# Patient Record
Sex: Male | Born: 1950
Health system: Southern US, Community
[De-identification: ages and names within clinical notes are randomized; demographics above are authoritative.]

## PROBLEM LIST (undated history)

## (undated) DIAGNOSIS — J40 Bronchitis, not specified as acute or chronic: Secondary | ICD-10-CM

## (undated) DIAGNOSIS — K635 Polyp of colon: Secondary | ICD-10-CM

## (undated) DIAGNOSIS — G4730 Sleep apnea, unspecified: Secondary | ICD-10-CM

## (undated) DIAGNOSIS — T466X5A Adverse effect of antihyperlipidemic and antiarteriosclerotic drugs, initial encounter: Secondary | ICD-10-CM

## (undated) DIAGNOSIS — D369 Benign neoplasm, unspecified site: Secondary | ICD-10-CM

## (undated) DIAGNOSIS — I10 Essential (primary) hypertension: Secondary | ICD-10-CM

## (undated) DIAGNOSIS — J439 Emphysema, unspecified: Secondary | ICD-10-CM

## (undated) DIAGNOSIS — E785 Hyperlipidemia, unspecified: Secondary | ICD-10-CM

## (undated) DIAGNOSIS — E7800 Pure hypercholesterolemia, unspecified: Secondary | ICD-10-CM

## (undated) DIAGNOSIS — E039 Hypothyroidism, unspecified: Secondary | ICD-10-CM

## (undated) DIAGNOSIS — J449 Chronic obstructive pulmonary disease, unspecified: Secondary | ICD-10-CM

## (undated) DIAGNOSIS — G720 Drug-induced myopathy: Secondary | ICD-10-CM

## (undated) DIAGNOSIS — R0902 Hypoxemia: Secondary | ICD-10-CM

## (undated) DIAGNOSIS — M1990 Unspecified osteoarthritis, unspecified site: Secondary | ICD-10-CM

## (undated) DIAGNOSIS — R0600 Dyspnea, unspecified: Secondary | ICD-10-CM

## (undated) HISTORY — DX: Emphysema, unspecified: J43.9

## (undated) HISTORY — DX: Benign neoplasm, unspecified site: D36.9

## (undated) HISTORY — DX: Hypoxemia: R09.02

## (undated) HISTORY — PX: COLONOSCOPY: SHX174

## (undated) HISTORY — DX: Polyp of colon: K63.5

## (undated) HISTORY — DX: Hyperlipidemia, unspecified: E78.5

## (undated) HISTORY — DX: Drug-induced myopathy: T46.6X5A

## (undated) HISTORY — PX: TONSILLECTOMY: SUR1361

## (undated) HISTORY — DX: Drug-induced myopathy: G72.0

## (undated) HISTORY — DX: Sleep apnea, unspecified: G47.30

## (undated) HISTORY — DX: Unspecified osteoarthritis, unspecified site: M19.90

## (undated) HISTORY — DX: Bronchitis, not specified as acute or chronic: J40

---
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
## 2006-07-17 DIAGNOSIS — K635 Polyp of colon: Secondary | ICD-10-CM

## 2006-07-17 HISTORY — DX: Polyp of colon: K63.5

## 2007-06-17 ENCOUNTER — Ambulatory Visit: Payer: Self-pay | Admitting: General Surgery

## 2007-09-27 ENCOUNTER — Ambulatory Visit: Payer: Self-pay | Admitting: Family Medicine

## 2012-01-12 ENCOUNTER — Emergency Department (HOSPITAL_COMMUNITY): Payer: BC Managed Care – PPO

## 2012-01-12 ENCOUNTER — Emergency Department (HOSPITAL_COMMUNITY)
Admission: EM | Admit: 2012-01-12 | Discharge: 2012-01-12 | Disposition: A | Discharge status: Home / Self Care | Payer: BC Managed Care – PPO | Attending: Emergency Medicine | Admitting: Emergency Medicine

## 2012-01-12 ENCOUNTER — Encounter (HOSPITAL_COMMUNITY): Payer: Self-pay

## 2012-01-12 DIAGNOSIS — R55 Syncope and collapse: Secondary | ICD-10-CM | POA: Insufficient documentation

## 2012-01-12 DIAGNOSIS — I10 Essential (primary) hypertension: Secondary | ICD-10-CM | POA: Insufficient documentation

## 2012-01-12 DIAGNOSIS — R079 Chest pain, unspecified: Secondary | ICD-10-CM | POA: Insufficient documentation

## 2012-01-12 DIAGNOSIS — E119 Type 2 diabetes mellitus without complications: Secondary | ICD-10-CM | POA: Insufficient documentation

## 2012-01-12 DIAGNOSIS — F17200 Nicotine dependence, unspecified, uncomplicated: Secondary | ICD-10-CM | POA: Insufficient documentation

## 2012-01-12 DIAGNOSIS — M549 Dorsalgia, unspecified: Secondary | ICD-10-CM | POA: Insufficient documentation

## 2012-01-12 DIAGNOSIS — Z79899 Other long term (current) drug therapy: Secondary | ICD-10-CM | POA: Insufficient documentation

## 2012-01-12 HISTORY — DX: Essential (primary) hypertension: I10

## 2012-01-12 LAB — CBC
HCT: 39.1 % (ref 39.0–52.0)
Hemoglobin: 13.6 g/dL (ref 13.0–17.0)
MCH: 30.9 pg (ref 26.0–34.0)
MCHC: 34.8 g/dL (ref 30.0–36.0)
MCV: 88.9 fL (ref 78.0–100.0)
Platelets: 193 10*3/uL (ref 150–400)
RBC: 4.4 MIL/uL (ref 4.22–5.81)
RDW: 13 % (ref 11.5–15.5)
WBC: 6.9 10*3/uL (ref 4.0–10.5)

## 2012-01-12 LAB — BASIC METABOLIC PANEL
BUN: 22 mg/dL (ref 6–23)
CO2: 24 mEq/L (ref 19–32)
Calcium: 9.3 mg/dL (ref 8.4–10.5)
Chloride: 104 mEq/L (ref 96–112)
Creatinine, Ser: 0.91 mg/dL (ref 0.50–1.35)
GFR calc Af Amer: >90 mL/min (ref >90)
GFR calc non Af Amer: >90 mL/min (ref >90)
Glucose, Bld: 202 mg/dL — ABNORMAL HIGH (ref 70–99)
Potassium: 3.2 mEq/L — ABNORMAL LOW (ref 3.5–5.1)
Sodium: 140 mEq/L (ref 135–145)

## 2012-01-12 LAB — POCT I-STAT TROPONIN I: Troponin i, poc: 0 ng/mL (ref 0.00–0.08)

## 2012-01-12 NOTE — Discharge Instructions (Signed)
FOLLOW UP WITH YOUR DOCTOR ON Monday FOR RECHECK. CONTINUE YOUR CURRENT MEDICATIONS AS USUAL. RETURN HERE WITH ANY WORSENING SYMPTOMS OR NEW CONCERNS.  Chest Pain (Nonspecific) It is often hard to give a specific diagnosis for the cause of chest pain. There is always a chance that your pain could be related to something serious, such as a heart attack or a blood clot in the lungs. You need to follow up with your caregiver for further evaluation. CAUSES   Heartburn.   Pneumonia or bronchitis.   Anxiety or stress.   Inflammation around your heart (pericarditis) or lung (pleuritis or pleurisy).   A blood clot in the lung.   A collapsed lung (pneumothorax). It can develop suddenly on its own (spontaneous pneumothorax) or from injury (trauma) to the chest.   Shingles infection (herpes zoster virus).  The chest wall is composed of bones, muscles, and cartilage. Any of these can be the source of the pain.  The bones can be bruised by injury.   The muscles or cartilage can be strained by coughing or overwork.   The cartilage can be affected by inflammation and become sore (costochondritis).  DIAGNOSIS  Lab tests or other studies, such as X-rays, electrocardiography, stress testing, or cardiac imaging, may be needed to find the cause of your pain.  TREATMENT   Treatment depends on what may be causing your chest pain. Treatment may include:   Acid blockers for heartburn.   Anti-inflammatory medicine.   Pain medicine for inflammatory conditions.   Antibiotics if an infection is present.   You may be advised to change lifestyle habits. This includes stopping smoking and avoiding alcohol, caffeine, and chocolate.   You may be advised to keep your head raised (elevated) when sleeping. This reduces the chance of acid going backward from your stomach into your esophagus.   Most of the time, nonspecific chest pain will improve within 2 to 3 days with rest and mild pain medicine.  HOME  CARE INSTRUCTIONS   If antibiotics were prescribed, take your antibiotics as directed. Finish them even if you start to feel better.   For the next few days, avoid physical activities that bring on chest pain. Continue physical activities as directed.   Do not smoke.   Avoid drinking alcohol.   Only take over-the-counter or prescription medicine for pain, discomfort, or fever as directed by your caregiver.   Follow your caregiver's suggestions for further testing if your chest pain does not go away.   Keep any follow-up appointments you made. If you do not go to an appointment, you could develop lasting (chronic) problems with pain. If there is any problem keeping an appointment, you must call to reschedule.  SEEK MEDICAL CARE IF:   You think you are having problems from the medicine you are taking. Read your medicine instructions carefully.   Your chest pain does not go away, even after treatment.   You develop a rash with blisters on your chest.  SEEK IMMEDIATE MEDICAL CARE IF:   You have increased chest pain or pain that spreads to your arm, neck, jaw, back, or abdomen.   You develop shortness of breath, an increasing cough, or you are coughing up blood.   You have severe back or abdominal pain, feel nauseous, or vomit.   You develop severe weakness, fainting, or chills.   You have a fever.  THIS IS AN EMERGENCY. Do not wait to see if the pain will go away. Get medical help at  once. Call your local emergency services (911 in [place] Do not drive yourself to the hospital. MAKE SURE YOU:   Understand these instructions.   Will watch your condition.   Will get help right away if you are not doing well or get worse.  Document Released: 04/12/2005 Document Revised: 06/22/2011 Document Reviewed: 02/06/2008 [facility] Patient Information 2012 [place], [place].

## 2012-01-12 NOTE — ED Notes (Signed)
JYN:[id number]<AO> Expected date:<BR> Expected time:<BR> Means of arrival:<BR> Comments:<BR> Hold per charge

## 2012-01-12 NOTE — ED Provider Notes (Addendum)
History     CSN: [id number]  Arrival date & time 01/12/12  1721   First MD Initiated Contact with Patient 01/12/12 2011      Chief Complaint  Patient presents with  . Near Syncope  . Chest Pain    (Consider location/radiation/quality/duration/timing/severity/associated sxs/prior treatment) Patient is a 61 y.o. male presenting with chest pain. The history is provided by the patient.  Chest Pain The chest pain is resolved. The quality of the pain is described as sharp. The pain radiates to the upper back. Pertinent negatives for primary symptoms include no fever, no shortness of breath, no cough and no nausea. Associated symptoms comments: He had a sudden onset, sharp left chest pain that radiated around to the left upper back. It lasted a period of seconds then resolved. He has not had any recurrent pain since that one episode approximately 6 hours ago. No SOB, cough, fever, nausea. He felt lightheaded for the duration of the pain, but no frank syncope. He denies any past history of heart disease.Marland Kitchen     Past Medical History  Diagnosis Date  . Diabetes mellitus   . Hypertension     Past Surgical History  Procedure Date  . Tonsillectomy     No family history on file.  History  Substance Use Topics  . Smoking status: Current Everyday Smoker -- 35 years    Types: Cigarettes  . Smokeless tobacco: Not on file  . Alcohol Use: No      Review of Systems  Constitutional: Negative for fever.  Respiratory: Negative for cough and shortness of breath.   Cardiovascular: Positive for chest pain.  Gastrointestinal: Negative for nausea.  Neurological: Positive for light-headedness. Negative for syncope and headaches.    Allergies  Penicillins  Home Medications   Current Outpatient Rx  Name Route Sig Dispense Refill  . FENOFIBRATE 145 MG PO TABS Oral Take 145 mg by mouth daily.    Marland Kitchen GLIPIZIDE 5 MG PO TABS Oral Take 5 mg by mouth 2 (two) times daily before a meal.    .  HYDROCHLOROTHIAZIDE 50 MG PO TABS Oral Take 50 mg by mouth daily.    Marland Kitchen METFORMIN HCL 1000 MG PO TABS Oral Take 1,000 mg by mouth 2 (two) times daily with a meal.    . RAMIPRIL 10 MG PO CAPS Oral Take 10 mg by mouth daily.    Marland Kitchen SITAGLIPTIN PHOSPHATE 100 MG PO TABS Oral Take 100 mg by mouth daily.      BP 150/69  Pulse 92  Temp 97.9 F (36.6 C) (Oral)  Resp 18  SpO2 98%  Physical Exam  Constitutional: He is oriented to person, place, and time. He appears well-developed and well-nourished. No distress.  HENT:  Head: Normocephalic and atraumatic.  Cardiovascular: Normal rate and regular rhythm.   No murmur heard. Pulmonary/Chest: Effort normal and breath sounds normal. He has no wheezes. He has no rales. He exhibits no tenderness.  Abdominal: Soft. Bowel sounds are normal. There is no tenderness. There is no rebound.  Musculoskeletal: Normal range of motion. He exhibits no edema.  Neurological: He is alert and oriented to person, place, and time.  Skin: Skin is warm and dry.  Psychiatric: He has a normal mood and affect.    ED Course  Procedures (including critical care time)   Labs Reviewed  CBC  POCT I-STAT TROPONIN I  BASIC METABOLIC PANEL   Results for orders placed during the hospital encounter of 01/12/12  CBC  Component Value Range   WBC 6.9  4.0 - 10.5 K/uL   RBC 4.40  4.22 - 5.81 MIL/uL   Hemoglobin 13.6  13.0 - 17.0 g/dL   HCT 96.0  45.4 - 09.8 %   MCV 88.9  78.0 - 100.0 fL   MCH 30.9  26.0 - 34.0 pg   MCHC 34.8  30.0 - 36.0 g/dL   RDW 11.9  14.7 - 82.9 %   Platelets 193  150 - 400 K/uL  POCT I-STAT TROPONIN I      Component Value Range   Troponin i, poc 0.00  0.00 - 0.08 ng/mL   Comment 3             Dg Chest 2 View  01/12/2012  *RADIOLOGY REPORT*  Clinical Data: Near-syncope.  Chest pain.  CHEST - 2 VIEW  Comparison: No priors.  Findings: Lung volumes are low.  No consolidative airspace disease. No pleural effusions.  There is diffuse bronchial wall  thickening. Pulmonary vasculature is normal.  Heart size is normal. Mediastinal contours are unremarkable.  IMPRESSION: 1.  Diffuse bronchial wall thickening, suggestive of acute bronchitis.  Original Report Authenticated By: Florencia Reasons, M.D.    Date: 03/13/2012  Rate: 83  Rhythm: normal sinus rhythm  QRS Axis: right  Intervals: normal  ST/T Wave abnormalities: normal  Conduction Disutrbances:none  Narrative Interpretation:   Old EKG Reviewed: none available    No diagnosis found.  1. Chest pain  MDM  He has remained asymptomatic from a brief episode of chest and back pain that does not follow cardiac pattern. Neg troponin, normal EKG and CXR significant for bronchitis only without respiratory complaints. Feel he can be discharged home to follow up with primary care.        Rodena Medin, PA-C 01/12/12 2054  Rodena Medin, PA-C 03/13/12 1451

## 2012-01-12 NOTE — ED Notes (Signed)
Per EMS: Pt was at work began to feel dizzy lightheaded. Pt states a sharp chest pain that went down the left arm but resided with rest. AxO 4. Denies n/v.

## 2012-01-18 NOTE — ED Provider Notes (Signed)
Medical screening examination/treatment/procedure(s) were performed by non-physician practitioner and as supervising physician I was immediately available for consultation/collaboration.  Sephiroth Mcluckie, MD 01/18/12 2322 

## 2012-03-13 NOTE — ED Provider Notes (Signed)
Medical screening examination/treatment/procedure(s) were performed by non-physician practitioner and as supervising physician I was immediately available for consultation/collaboration.  Raeford Razor, MD 03/13/12 [phone]

## 2013-08-07 ENCOUNTER — Encounter: Payer: Self-pay | Admitting: Podiatry

## 2013-08-08 ENCOUNTER — Encounter: Payer: Self-pay | Admitting: Podiatry

## 2013-08-08 ENCOUNTER — Ambulatory Visit (INDEPENDENT_AMBULATORY_CARE_PROVIDER_SITE_OTHER): Payer: BC Managed Care – PPO | Admitting: Podiatry

## 2013-08-08 VITALS — BP 135/76 | HR 80 | Resp 18 | Ht 70.0 in | Wt 232.0 lb

## 2013-08-08 DIAGNOSIS — L84 Corns and callosities: Secondary | ICD-10-CM

## 2013-08-08 DIAGNOSIS — M79609 Pain in unspecified limb: Secondary | ICD-10-CM

## 2013-08-08 DIAGNOSIS — M7750 Other enthesopathy of unspecified foot: Secondary | ICD-10-CM

## 2013-08-08 DIAGNOSIS — B351 Tinea unguium: Secondary | ICD-10-CM

## 2013-08-08 NOTE — Progress Notes (Signed)
Check the calluses on my feet and trim toenails

## 2013-08-08 NOTE — Progress Notes (Signed)
Subjective:     Patient ID: [name], male   DOB: 09/12/1950, 63 y.o.   MRN: [id number]  HPI patient presents with severe nail thickness 1-5 both feet and severe keratotic lesions underneath both feet that orthotics are helping with still painful   Review of Systems     Objective:   Physical Exam Neurovascular status intact with no health history changes noted and severe thickened keratotic lesions sub-third metatarsal of both feet and nail disease with thickness 1-5 of both feet    Assessment:     Plantar flex metatarsals with lesions and mycotic nail infection bilateral with chronic capsulitis around the MPJ    Plan:     Debridement lesions both feet and debridement nailbeds 1-5 both feet with no iatrogenic bleeding noted and we will make a second pair of orthotics so he has the ability to rotate to pair

## 2013-08-29 ENCOUNTER — Encounter: Payer: Self-pay | Admitting: Podiatry

## 2013-08-29 NOTE — Progress Notes (Signed)
SENT PT POST CARD LETTING HIM KNOW ORTHOTICS ARE HERE. 

## 2013-09-05 ENCOUNTER — Encounter: Payer: Self-pay | Admitting: Podiatry

## 2013-11-28 DIAGNOSIS — M79609 Pain in unspecified limb: Secondary | ICD-10-CM

## 2013-12-19 ENCOUNTER — Encounter: Payer: Self-pay | Admitting: *Deleted

## 2013-12-19 NOTE — Progress Notes (Signed)
Sent pt post card letting him know orthotics are here.

## 2013-12-26 ENCOUNTER — Ambulatory Visit (INDEPENDENT_AMBULATORY_CARE_PROVIDER_SITE_OTHER): Payer: BC Managed Care – PPO | Admitting: Podiatry

## 2013-12-26 VITALS — BP 140/76 | HR 81 | Resp 16

## 2013-12-26 DIAGNOSIS — M79609 Pain in unspecified limb: Secondary | ICD-10-CM

## 2013-12-26 DIAGNOSIS — B351 Tinea unguium: Secondary | ICD-10-CM

## 2013-12-26 DIAGNOSIS — L84 Corns and callosities: Secondary | ICD-10-CM

## 2013-12-26 NOTE — Patient Instructions (Signed)

## 2013-12-26 NOTE — Progress Notes (Signed)
Subjective:     Patient ID: [name], male   DOB: Dec 02, 1950, 63 y.o.   MRN: [id number]  HPI patient has severe nail disease and lesions underneath the fourth metatarsal of both feet that are painful   Review of Systems     Objective:   Physical Exam Vascular status intact with severe thickness debris brittleness and yellow discoloration of all nails and keratotic lesion sub-fourth metatarsal both feet    Assessment:     Mycotic nail infection and lesion secondary to bone pressure    Plan:     Debris painful nailbeds and debridement lesions on both feet with no iatrogenic bleeding noted

## 2014-03-16 DIAGNOSIS — M1991 Primary osteoarthritis, unspecified site: Secondary | ICD-10-CM | POA: Insufficient documentation

## 2014-05-22 ENCOUNTER — Encounter: Payer: Self-pay | Admitting: Podiatry

## 2014-05-22 ENCOUNTER — Ambulatory Visit (INDEPENDENT_AMBULATORY_CARE_PROVIDER_SITE_OTHER): Payer: BC Managed Care – PPO | Admitting: Podiatry

## 2014-05-22 DIAGNOSIS — B351 Tinea unguium: Secondary | ICD-10-CM

## 2014-05-22 DIAGNOSIS — L84 Corns and callosities: Secondary | ICD-10-CM

## 2014-05-22 DIAGNOSIS — M79676 Pain in unspecified toe(s): Secondary | ICD-10-CM

## 2014-05-22 NOTE — Progress Notes (Signed)
Subjective:     Patient ID: [name], male   DOB: 1951-05-15, 63 y.o.   MRN: [id number]  HPIpatient presents with nail disease 1-5 both feet that are thick and he cannot cut and lesions underneath third metatarsal both feet that are painful when pressed   Review of Systems     Objective:   Physical Exam Neurovascular status unchanged with thick yellow brittle nailbeds 1-5 both feet that's painful and keratotic lesion sub-third metatarsal of both feet that are painful    Assessment:     Mycotic nail infection with pain 1-5 both feet and lesions of both feet that are painful    Plan:     Debride painful nailbeds 1-5 both feet with no iatrogenic bleeding noted and debridement lesions on both feet with no iatrogenic bleeding noted

## 2014-08-18 ENCOUNTER — Encounter: Payer: Self-pay | Admitting: General Surgery

## 2014-08-19 ENCOUNTER — Encounter: Payer: Self-pay | Admitting: General Surgery

## 2014-08-19 ENCOUNTER — Ambulatory Visit (INDEPENDENT_AMBULATORY_CARE_PROVIDER_SITE_OTHER): Payer: BLUE CROSS/BLUE SHIELD | Admitting: General Surgery

## 2014-08-19 VITALS — BP 150/80 | HR 78 | Resp 12 | Ht 70.0 in | Wt 236.0 lb

## 2014-08-19 DIAGNOSIS — Z86010 Personal history of colonic polyps: Secondary | ICD-10-CM

## 2014-08-19 MED ORDER — POLYETHYLENE GLYCOL 3350 17 GM/SCOOP PO POWD: ORAL | Status: DC

## 2014-08-19 NOTE — Patient Instructions (Addendum)
Colonoscopy A colonoscopy is an exam to look at the entire large intestine (colon). This exam can help find problems such as tumors, polyps, inflammation, and areas of bleeding. The exam takes about 1 hour.  LET [facility] CARE PROVIDER KNOW ABOUT:   Any allergies you have.  All medicines you are taking, including vitamins, herbs, eye drops, creams, and over-the-counter medicines.  Previous problems you or members of your family have had with the use of anesthetics.  Any blood disorders you have.  Previous surgeries you have had.  Medical conditions you have. RISKS AND COMPLICATIONS  Generally, this is a safe procedure. However, as with any procedure, complications can occur. Possible complications include:  Bleeding.  Tearing or rupture of the colon wall.  Reaction to medicines given during the exam.  Infection (rare). BEFORE THE PROCEDURE   Ask your health care provider about changing or stopping your regular medicines.  You may be prescribed an oral bowel prep. This involves drinking a large amount of medicated liquid, starting the day before your procedure. The liquid will cause you to have multiple loose stools until your stool is almost clear or light green. This cleans out your colon in preparation for the procedure.  Do not eat or drink anything else once you have started the bowel prep, unless your health care provider tells you it is safe to do so.  Arrange for someone to drive you home after the procedure. PROCEDURE   You will be given medicine to help you relax (sedative).  You will lie on your side with your knees bent.  A long, flexible tube with a light and camera on the end (colonoscope) will be inserted through the rectum and into the colon. The camera sends video back to a computer screen as it moves through the colon. The colonoscope also releases carbon dioxide gas to inflate the colon. This helps your health care provider see the area better.  During  the exam, your health care provider may take a small tissue sample (biopsy) to be examined under a microscope if any abnormalities are found.  The exam is finished when the entire colon has been viewed. AFTER THE PROCEDURE   Do not drive for 24 hours after the exam.  You may have a small amount of blood in your stool.  You may pass moderate amounts of gas and have mild abdominal cramping or bloating. This is caused by the gas used to inflate your colon during the exam.  Ask when your test results will be ready and how you will get your results. Make sure you get your test results. Document Released: 06/30/2000 Document Revised: 04/23/2013 Document Reviewed: 03/10/2013 [facility] Patient Information 2015 [place], [place]. This information is not intended to replace advice given to you by your health care provider. Make sure you discuss any questions you have with your health care provider.  Patient has been scheduled for a colonoscopy on 09-09-14 at [facility]. This patient has been asked to hold glipizide and metformin day of colonoscopy prep and procedure. It is okay for patient to continue Januvia day of colonoscopy prep. Also, weekly dose of Bydureon may be continued as normal.

## 2014-08-19 NOTE — Progress Notes (Signed)
Patient ID: [name], male   DOB: May 19, 1951, 64 y.o.   MRN: [id number]  Chief Complaint  Patient presents with  . Colonoscopy    HPI [name] is a 64 y.o. male.  Here today to discuss having a colonoscopy. Last colonoscopy was 2008. Denies any bowel issues. Bowels move regular and daily.  HPI  Past Medical History  Diagnosis Date  . Diabetes mellitus   . Hypertension   . Bronchitis   . Colon polyp 2008  . Arthritis     knee    Past Surgical History  Procedure Laterality Date  . Tonsillectomy    . Colonoscopy  2008    Dr Bary Castilla    Family History  Problem Relation Age of Onset  . Stroke Mother   . Stroke Father   . Diabetes Other   . Other Daughter 14    precancerous colon polyp     Social History History  Substance Use Topics  . Smoking status: Current Every Day Smoker -- 2.00 packs/day for 35 years    Types: Cigarettes  . Smokeless tobacco: Never Used  . Alcohol Use: No    Allergies  Allergen Reactions  . Penicillins Rash    Current Outpatient Prescriptions  Medication Sig Dispense Refill  . BYDUREON 2 MG SUSR     . fenofibrate (TRICOR) 145 MG tablet Take 145 mg by mouth daily.    Marland Kitchen glipiZIDE (GLUCOTROL) 5 MG tablet Take 2.5 mg by mouth 2 (two) times daily before a meal.     . hydrochlorothiazide (HYDRODIURIL) 50 MG tablet Take 50 mg by mouth daily.    . meloxicam (MOBIC) 15 MG tablet   2  . metFORMIN (GLUCOPHAGE) 1000 MG tablet Take 1,000 mg by mouth 2 (two) times daily with a meal.    . ramipril (ALTACE) 10 MG capsule Take 10 mg by mouth daily.    . sitaGLIPtin (JANUVIA) 100 MG tablet Take 100 mg by mouth daily.    . polyethylene glycol powder (GLYCOLAX/MIRALAX) powder 255 grams one bottle for colonoscopy prep 255 g 0   No current facility-administered medications for this visit.    Review of Systems Review of Systems  Constitutional: Negative.   Respiratory: Negative.   Cardiovascular: Negative.   Gastrointestinal: Negative for  abdominal pain, diarrhea and constipation.    Blood pressure 150/80, pulse 78, resp. rate 12, height 5\' 10"  (1.778 m), weight 236 lb (107.049 kg).  Physical Exam Physical Exam  Constitutional: He is oriented to person, place, and time. He appears well-developed and well-nourished.  Neck: Neck supple.  Cardiovascular: Normal rate, regular rhythm and normal heart sounds.   Pulmonary/Chest: Effort normal and breath sounds normal.  Lymphadenopathy:    He has no cervical adenopathy.  Neurological: He is alert and oriented to person, place, and time.  Skin: Skin is warm and dry.    Data Reviewed Colonoscopy completed 06/17/2007 showed a 15 mm polyp in the cecum. Pathology showed mixed hyperplastic and adenomatous polyp. No high-grade dysplasia or malignancy. The millimeter polyp in the rectum was a tubular adenoma.  Assessment    Candidate for screening colonoscopy.    Plan    The procedure was reviewed.    Colonoscopy with possible biopsy/polypectomy prn: Information regarding the procedure, including its potential risks and complications (including but not limited to perforation of the bowel, which may require emergency surgery to repair, and bleeding) was verbally given to the patient. Educational information regarding lower instestinal endoscopy was given to  the patient. Written instructions for how to complete the bowel prep using Miralax were provided. The importance of drinking ample fluids to avoid dehydration as a result of the prep emphasized.  Patient has been scheduled for a colonoscopy on 09-09-14 at [facility]. This patient has been asked to hold glipizide and metformin day of colonoscopy prep and procedure. It is okay for patient to continue Januvia day of colonoscopy prep. Also, weekly dose of Bydureon may be continued as normal.   PCP:  Etheleen Mayhew 08/20/2014, 5:34 PM

## 2014-08-20 ENCOUNTER — Other Ambulatory Visit: Payer: Self-pay | Admitting: General Surgery

## 2014-08-20 DIAGNOSIS — Z86010 Personal history of colonic polyps: Secondary | ICD-10-CM | POA: Insufficient documentation

## 2014-09-07 ENCOUNTER — Telehealth: Payer: Self-pay | Admitting: *Deleted

## 2014-09-07 NOTE — Telephone Encounter (Signed)
OK to proceed as scheduled

## 2014-09-07 NOTE — Telephone Encounter (Signed)
Patient's wife called the office to report that patient was seen at [facility] on Saturday, 09-05-14 for sinus infection. No fever.  He was prescribed azithromycin 250 mg once daily, cetirizine 10 mg once daily, and fluticasone 50 mg two sprays in each nostril once daily.   Patient's wife states his symptoms are getting better since he started the medication on Saturday.   Patient is currently scheduled for a colonoscopy on Wednesday, 09-09-14 at [facility].

## 2014-09-07 NOTE — Telephone Encounter (Signed)
Patient's wife notified and verbalizes understanding.     

## 2014-09-09 ENCOUNTER — Ambulatory Visit: Payer: Self-pay | Admitting: General Surgery

## 2014-09-09 DIAGNOSIS — D126 Benign neoplasm of colon, unspecified: Secondary | ICD-10-CM

## 2014-09-10 ENCOUNTER — Encounter: Payer: Self-pay | Admitting: General Surgery

## 2014-09-12 ENCOUNTER — Telehealth: Payer: Self-pay | Admitting: General Surgery

## 2014-09-12 NOTE — Telephone Encounter (Signed)
The patient was notified that all the polyps removed at his recent colonoscopy were benign. One was a serrated adenoma, and considering multiple polyps were identified after a 10 year follow-up, he has been encouraged to have a repeat exam in 3 years.  He reports doing well with the exam earlier this week.

## 2014-09-14 ENCOUNTER — Encounter: Payer: Self-pay | Admitting: General Surgery

## 2014-10-30 ENCOUNTER — Ambulatory Visit (INDEPENDENT_AMBULATORY_CARE_PROVIDER_SITE_OTHER): Payer: BLUE CROSS/BLUE SHIELD | Admitting: Podiatry

## 2014-10-30 ENCOUNTER — Ambulatory Visit: Payer: BLUE CROSS/BLUE SHIELD | Admitting: Podiatry

## 2014-10-30 DIAGNOSIS — M79676 Pain in unspecified toe(s): Secondary | ICD-10-CM

## 2014-10-30 DIAGNOSIS — L84 Corns and callosities: Secondary | ICD-10-CM

## 2014-10-30 DIAGNOSIS — B351 Tinea unguium: Secondary | ICD-10-CM

## 2014-10-30 NOTE — Progress Notes (Signed)
Subjective:     Patient ID: [name], male   DOB: 1951-06-13, 64 y.o.   MRN: [id number]  HPIpatient presents with nail disease 1-5 both feet that are thick and he cannot cut and lesions underneath third metatarsal both feet that are painful when pressed   Review of Systems     Objective:   Physical Exam Neurovascular status unchanged with thick yellow brittle nailbeds 1-5 both feet that's painful and keratotic lesion sub-third metatarsal of both feet that are painful    Assessment:     Mycotic nail infection with pain 1-5 both feet and lesions of both feet that are painful    Plan:     Debride painful nailbeds 1-5 both feet with no iatrogenic bleeding noted and debridement lesions on both feet with no iatrogenic bleeding noted

## 2014-11-09 LAB — SURGICAL PATHOLOGY

## 2014-12-17 ENCOUNTER — Other Ambulatory Visit: Payer: Self-pay

## 2014-12-18 ENCOUNTER — Encounter: Payer: Self-pay | Admitting: Family Medicine

## 2014-12-18 ENCOUNTER — Ambulatory Visit (INDEPENDENT_AMBULATORY_CARE_PROVIDER_SITE_OTHER): Payer: BLUE CROSS/BLUE SHIELD | Admitting: Family Medicine

## 2014-12-18 VITALS — BP 118/60 | HR 84 | Temp 97.9°F | Resp 16 | Ht 69.0 in | Wt 236.0 lb

## 2014-12-18 DIAGNOSIS — Z720 Tobacco use: Secondary | ICD-10-CM | POA: Diagnosis not present

## 2014-12-18 DIAGNOSIS — E785 Hyperlipidemia, unspecified: Secondary | ICD-10-CM

## 2014-12-18 DIAGNOSIS — E119 Type 2 diabetes mellitus without complications: Secondary | ICD-10-CM | POA: Diagnosis not present

## 2014-12-18 NOTE — Progress Notes (Signed)
   Subjective:    Patient ID: [name], male    DOB: 1951-05-08, 64 y.o.   MRN: [id number]  Hyperlipidemia This is a chronic problem. The current episode started more than 1 year ago. Recent lipid tests were reviewed and are normal. Pertinent negatives include no chest pain, focal sensory loss, focal weakness, leg pain, myalgias or shortness of breath. Current antihyperlipidemic treatment includes fibric acid derivatives (pt does not tolerate STATINs, but is taking Fenofibrate). There are no compliance problems.  Risk factors for coronary artery disease include male sex and diabetes mellitus.  Diabetes He presents for his follow-up diabetic visit. He has type 2 diabetes mellitus. There are no hypoglycemic associated symptoms. Associated symptoms include foot paresthesias. Pertinent negatives for diabetes include no blurred vision, no chest pain, no fatigue, no foot ulcerations, no polydipsia, no polyphagia, no polyuria, no visual change, no weakness and no weight loss. Current diabetic treatment includes oral agent (triple therapy) and insulin injections. He is compliant with treatment all of the time. He participates in exercise every other day. (Pt reports he hasn't checked BS lately, due to malfunctioning glucometer ) He sees a podiatrist.Eye exam is current.  Nicotine Dependence Presents for follow-up (Pt was put on Bupropion at the [facility]) visit. Symptoms include cravings and irritability. Symptoms are negative for decreased concentration, fatigue, headache, insomnia and sore throat. Preferred tobacco types include cigarettes. His urge triggers include company of smokers. The symptoms have been improving (Pt has not had a cigarette in 1 week). Past treatments include buproprion. The treatment provided significant relief. Compliance with prior treatments has been good.       Review of Systems  Constitutional: Positive for irritability. Negative for weight loss and fatigue.  HENT: Negative for  sore throat.   Eyes: Negative for blurred vision.  Respiratory: Negative for shortness of breath.   Cardiovascular: Negative for chest pain.  Endocrine: Negative for polydipsia, polyphagia and polyuria.  Musculoskeletal: Negative for myalgias.  Neurological: Negative for focal weakness and weakness.  Psychiatric/Behavioral: Negative for decreased concentration. The patient does not have insomnia.        Objective:   Physical Exam  Constitutional: He appears well-developed and well-nourished.  Cardiovascular: Normal rate and regular rhythm.   Pulmonary/Chest: Effort normal and breath sounds normal.  Psychiatric: He has a normal mood and affect.    BP 118/60 mmHg  Pulse 84  Temp(Src) 97.9 F (36.6 C) (Oral)  Resp 16  Ht 5\' 9"  (1.753 m)  Wt 236 lb (107.049 kg)  BMI 34.84 kg/m2          Assessment & Plan:  1. Type 2 diabetes mellitus without complication Continue medications and current plan of care. Recheck A1C in 8 weeks. Check labs as below.  - Comprehensive metabolic panel  2. Hyperlipidemia Will check labs as today. FU pending results.  - TSH - Lipid panel  3. Tobacco abuse Improving. Pt has not had a cigarette in 1 week. Pt currently smoking e-cigs, but states he is going to quit on 03/12/2015. Continue Bupropion, and call office for problems.   Patient seen and examined by Jerrell Belfast, MD, and note scribed by Renaldo Fiddler, CMA.  I have reviewed the document for accuracy and completeness and I agree with above. Jerrell Belfast, MD

## 2014-12-19 LAB — COMPREHENSIVE METABOLIC PANEL
ALT: 27 IU/L (ref 0–44)
AST: 19 IU/L (ref 0–40)
Albumin/Globulin Ratio: 2.1 (ref 1.1–2.5)
Albumin: 4.7 g/dL (ref 3.6–4.8)
Alkaline Phosphatase: 53 IU/L (ref 39–117)
BUN/Creatinine Ratio: 26 — ABNORMAL HIGH (ref 10–22)
BUN: 25 mg/dL (ref 8–27)
Bilirubin Total: 0.4 mg/dL (ref 0.0–1.2)
CO2: 24 mmol/L (ref 18–29)
Calcium: 10 mg/dL (ref 8.6–10.2)
Chloride: 97 mmol/L (ref 97–108)
Creatinine, Ser: 0.95 mg/dL (ref 0.76–1.27)
GFR calc Af Amer: 99 mL/min/{1.73_m2} (ref >59)
GFR calc non Af Amer: 85 mL/min/{1.73_m2} (ref >59)
Globulin, Total: 2.2 g/dL (ref 1.5–4.5)
Glucose: 138 mg/dL — ABNORMAL HIGH (ref 65–99)
Potassium: 4.7 mmol/L (ref 3.5–5.2)
Sodium: 139 mmol/L (ref 134–144)
Total Protein: 6.9 g/dL (ref 6.0–8.5)

## 2014-12-19 LAB — LIPID PANEL
Chol/HDL Ratio: 3.9 ratio units (ref 0.0–5.0)
Cholesterol, Total: 158 mg/dL (ref 100–199)
HDL: 41 mg/dL (ref >39)
LDL Calculated: 98 mg/dL (ref 0–99)
Triglycerides: 93 mg/dL (ref 0–149)
VLDL Cholesterol Cal: 19 mg/dL (ref 5–40)

## 2014-12-19 LAB — TSH: TSH: 4.24 u[IU]/mL (ref 0.450–4.500)

## 2014-12-21 ENCOUNTER — Telehealth: Payer: Self-pay

## 2014-12-21 NOTE — Telephone Encounter (Signed)
Mrs. Celestin advised as directed below.   Thanks,   -Mickel Baas

## 2014-12-21 NOTE — Telephone Encounter (Signed)
-----   Message from Margarita Rana, MD sent at 12/21/2014 10:12 AM EDT ----- Labs are all within normal limits. Please notify patient. .Thanks.

## 2015-02-19 ENCOUNTER — Ambulatory Visit (INDEPENDENT_AMBULATORY_CARE_PROVIDER_SITE_OTHER): Payer: BLUE CROSS/BLUE SHIELD | Admitting: Family Medicine

## 2015-02-19 ENCOUNTER — Encounter: Payer: Self-pay | Admitting: Family Medicine

## 2015-02-19 VITALS — BP 138/68 | HR 88 | Temp 97.9°F | Resp 16 | Wt 246.0 lb

## 2015-02-19 DIAGNOSIS — E785 Hyperlipidemia, unspecified: Secondary | ICD-10-CM

## 2015-02-19 DIAGNOSIS — I10 Essential (primary) hypertension: Secondary | ICD-10-CM | POA: Diagnosis not present

## 2015-02-19 DIAGNOSIS — L989 Disorder of the skin and subcutaneous tissue, unspecified: Secondary | ICD-10-CM | POA: Diagnosis not present

## 2015-02-19 DIAGNOSIS — E119 Type 2 diabetes mellitus without complications: Secondary | ICD-10-CM

## 2015-02-19 DIAGNOSIS — Z720 Tobacco use: Secondary | ICD-10-CM

## 2015-02-19 LAB — POCT GLYCOSYLATED HEMOGLOBIN (HGB A1C)
Est. average glucose Bld gHb Est-mCnc: 192
Hemoglobin A1C: 8.3

## 2015-02-19 NOTE — Progress Notes (Signed)
Subjective:    Patient ID: [name], male    DOB: 02/16/1951, 64 y.o.   MRN: [id number]  HPI Comments: Last A1C was 10/30/2014 and was 7.3%.  Diabetes He presents for his follow-up diabetic visit. He has type 2 diabetes mellitus. Associated symptoms include fatigue. Pertinent negatives for diabetes include no blurred vision, no chest pain, no foot paresthesias, no foot ulcerations, no polydipsia, no polyphagia, no polyuria, no visual change, no weakness and no weight loss. There are no hypoglycemic complications. Current diabetic treatment includes oral agent (triple therapy) and insulin injections. He is compliant with treatment all of the time. He is following a generally healthy diet. He never (due to heat and knee injections) participates in exercise. Home blood sugar record trend: Not being checked due to malfunctioning glucometer. Eye exam is current.  Nicotine Dependence Symptoms include fatigue. Symptoms are negative for cravings, decreased concentration, headache, insomnia, irritability and sore throat. His urge triggers include company of smokers. Past treatments include buproprion. The treatment provided significant relief. Compliance with prior treatments has been good. Lavone is ready to quit.  Pt still smoking e-cigs, though he has cut back.    Review of Systems  Constitutional: Positive for fatigue. Negative for weight loss and irritability.  HENT: Negative for sore throat.   Eyes: Negative for blurred vision.  Cardiovascular: Negative for chest pain.  Endocrine: Negative for polydipsia, polyphagia and polyuria.  Neurological: Negative for weakness.  Psychiatric/Behavioral: Negative for decreased concentration. The patient does not have insomnia.    BP 138/68 mmHg  Pulse 88  Temp(Src) 97.9 F (36.6 C) (Oral)  Resp 16  Wt 246 lb (111.585 kg)   Patient Active Problem List   Diagnosis Date Noted  . Diabetes 12/18/2014  . Hyperlipidemia 12/18/2014  . Tobacco abuse  12/18/2014  . History of colonic polyps 08/20/2014  . Idiopathic localized osteoarthropathy 03/16/2014   Past Medical History  Diagnosis Date  . Diabetes mellitus   . Hypertension   . Bronchitis   . Colon polyp 2008  . Arthritis     knee   Current Outpatient Prescriptions on File Prior to Visit  Medication Sig  . buPROPion (WELLBUTRIN SR) 150 MG 12 hr tablet   . fenofibrate (TRICOR) 145 MG tablet Take 145 mg by mouth daily.  Marland Kitchen GLIPIZIDE XL 5 MG 24 hr tablet   . hydrochlorothiazide (HYDRODIURIL) 50 MG tablet Take 50 mg by mouth daily.  . metFORMIN (GLUCOPHAGE) 1000 MG tablet Take 1,000 mg by mouth 2 (two) times daily with a meal.  . ramipril (ALTACE) 10 MG capsule Take 10 mg by mouth daily.  . sitaGLIPtin (JANUVIA) 100 MG tablet Take 100 mg by mouth daily.  . fluticasone (FLONASE) 50 MCG/ACT nasal spray   . montelukast (SINGULAIR) 10 MG tablet   . PROAIR HFA 108 (90 BASE) MCG/ACT inhaler    No current facility-administered medications on file prior to visit.   Allergies  Allergen Reactions  . Penicillins Rash   Past Surgical History  Procedure Laterality Date  . Tonsillectomy    . Colonoscopy  2008    Dr Bary Castilla   History   Social History  . Marital Status: Married    Spouse Name: N/A  . Number of Children: N/A  . Years of Education: N/A   Occupational History  . Not on file.   Social History Main Topics  . Smoking status: Former Smoker -- 2.00 packs/day for 35 years    Types: Cigarettes, E-cigarettes  Quit date: 12/11/2014  . Smokeless tobacco: Never Used     Comment: Pt still smokes e-cigarettes, occasionally a "drag"  . Alcohol Use: No  . Drug Use: No  . Sexual Activity: Not on file   Other Topics Concern  . Not on file   Social History Narrative   Family History  Problem Relation Age of Onset  . Stroke Mother   . Stroke Father   . Diabetes Other   . Other Daughter 25    precancerous colon polyp         Objective:   Physical Exam   Constitutional: He is oriented to person, place, and time. He appears well-developed and well-nourished.  Cardiovascular: Normal rate and regular rhythm.   Pulmonary/Chest: Effort normal and breath sounds normal.  Musculoskeletal: Normal range of motion.  Large 3 cm lesion, raised on left lower extremity with eschar.    Neurological: He is alert and oriented to person, place, and time.   Diabetic Foot Exam - Simple   Simple Foot Form  Diabetic Foot exam was performed with the following findings:  Yes 02/19/2015  8:57 AM  Visual Inspection  No deformities, no ulcerations, no other skin breakdown bilaterally:  Yes  See comments:  Yes  Sensation Testing  Intact to touch and monofilament testing bilaterally:  Yes  See comments:  Yes  Pulse Check  Posterior Tibialis and Dorsalis pulse intact bilaterally:  Yes  See comments:  Yes  Comments     BP 138/68 mmHg  Pulse 88  Temp(Src) 97.9 F (36.6 C) (Oral)  Resp 16  Wt 246 lb (111.585 kg)        Assessment & Plan:  1. Type 2 diabetes mellitus without complication Not as good as previous. Wants to work harder on lifestyle change.  - POCT glycosylated hemoglobin (Hb A1C) - Urine Microalbumin w/creat. ratio - Comprehensive metabolic panel Results for orders placed or performed in visit on 02/19/15  POCT glycosylated hemoglobin (Hb A1C)  Result Value Ref Range   Hemoglobin A1C 8.3    Est. average glucose Bld gHb Est-mCnc 192    2. Tobacco abuse Stable. Continue not smoking.   3. Skin lesion of left lower limb Large lesion not with scab on it. Will refer.  - Ambulatory referral to General Surgery  4. Essential hypertension Condition is stable. Please continue current medication and  plan of care as noted.   - TSH - CBC with Differential/Platelet  5. Hyperlipidemia Stable.  - Lipid panel  Margarita Rana, MD

## 2015-02-20 LAB — CBC WITH DIFFERENTIAL/PLATELET
Basophils Absolute: 0 10*3/uL (ref 0.0–0.2)
Basos: 0 %
EOS (ABSOLUTE): 0.2 10*3/uL (ref 0.0–0.4)
Eos: 3 %
Hematocrit: 41.3 % (ref 37.5–51.0)
Hemoglobin: 14.2 g/dL (ref 12.6–17.7)
Immature Grans (Abs): 0 10*3/uL (ref 0.0–0.1)
Immature Granulocytes: 0 %
Lymphocytes Absolute: 2.6 10*3/uL (ref 0.7–3.1)
Lymphs: 32 %
MCH: 30.3 pg (ref 26.6–33.0)
MCHC: 34.4 g/dL (ref 31.5–35.7)
MCV: 88 fL (ref 79–97)
Monocytes Absolute: 0.4 10*3/uL (ref 0.1–0.9)
Monocytes: 5 %
Neutrophils Absolute: 4.8 10*3/uL (ref 1.4–7.0)
Neutrophils: 60 %
Platelets: 269 10*3/uL (ref 150–379)
RBC: 4.68 x10E6/uL (ref 4.14–5.80)
RDW: 13 % (ref 12.3–15.4)
WBC: 8.1 10*3/uL (ref 3.4–10.8)

## 2015-02-20 LAB — TSH: TSH: 3.23 u[IU]/mL (ref 0.450–4.500)

## 2015-02-20 LAB — COMPREHENSIVE METABOLIC PANEL
ALT: 41 IU/L (ref 0–44)
AST: 22 IU/L (ref 0–40)
Albumin/Globulin Ratio: 2.1 (ref 1.1–2.5)
Albumin: 4.7 g/dL (ref 3.6–4.8)
Alkaline Phosphatase: 62 IU/L (ref 39–117)
BUN/Creatinine Ratio: 19 (ref 10–22)
BUN: 20 mg/dL (ref 8–27)
Bilirubin Total: 0.3 mg/dL (ref 0.0–1.2)
CO2: 24 mmol/L (ref 18–29)
Calcium: 10 mg/dL (ref 8.6–10.2)
Chloride: 99 mmol/L (ref 97–108)
Creatinine, Ser: 1.05 mg/dL (ref 0.76–1.27)
GFR calc Af Amer: 88 mL/min/{1.73_m2} (ref >59)
GFR calc non Af Amer: 76 mL/min/{1.73_m2} (ref >59)
Globulin, Total: 2.2 g/dL (ref 1.5–4.5)
Glucose: 188 mg/dL — ABNORMAL HIGH (ref 65–99)
Potassium: 4.5 mmol/L (ref 3.5–5.2)
Sodium: 139 mmol/L (ref 134–144)
Total Protein: 6.9 g/dL (ref 6.0–8.5)

## 2015-02-20 LAB — MICROALBUMIN / CREATININE URINE RATIO
Creatinine, Urine: 144.9 mg/dL
MICROALB/CREAT RATIO: 4.5 mg/g creat (ref 0.0–30.0)
Microalbumin, Urine: 6.5 ug/mL

## 2015-02-20 LAB — LIPID PANEL
Chol/HDL Ratio: 3.6 ratio units (ref 0.0–5.0)
Cholesterol, Total: 151 mg/dL (ref 100–199)
HDL: 42 mg/dL (ref >39)
LDL Calculated: 82 mg/dL (ref 0–99)
Triglycerides: 135 mg/dL (ref 0–149)
VLDL Cholesterol Cal: 27 mg/dL (ref 5–40)

## 2015-02-22 ENCOUNTER — Telehealth: Payer: Self-pay

## 2015-02-22 NOTE — Telephone Encounter (Signed)
Advised pt of lab results. Pt verbally acknowledges understanding. Pt is concerned because he researched Wellbutrin, and believes that this may causing the increase in his A1C. Pt reports he will be D/C Wellbutrin in the next 10 days. Pt also got a new meter, and has been checking BS. [name], CMA

## 2015-02-22 NOTE — Telephone Encounter (Signed)
LMTCB 02/22/2015  Thanks,   -Mikael Skoda  

## 2015-02-22 NOTE — Telephone Encounter (Signed)
Pt returned call. Thanks TNP °

## 2015-02-22 NOTE — Telephone Encounter (Signed)
-----   Message from Margarita Rana, MD sent at 02/20/2015 10:47 AM EDT ----- Labs stable.  PLease notify patient. Thanks.

## 2015-03-04 ENCOUNTER — Encounter: Payer: Self-pay | Admitting: General Surgery

## 2015-03-04 ENCOUNTER — Ambulatory Visit (INDEPENDENT_AMBULATORY_CARE_PROVIDER_SITE_OTHER): Payer: BLUE CROSS/BLUE SHIELD | Admitting: General Surgery

## 2015-03-04 VITALS — BP 130/70 | HR 78 | Resp 14 | Ht 70.0 in | Wt 244.0 lb

## 2015-03-04 DIAGNOSIS — R2242 Localized swelling, mass and lump, left lower limb: Secondary | ICD-10-CM | POA: Diagnosis not present

## 2015-03-04 DIAGNOSIS — Z87891 Personal history of nicotine dependence: Secondary | ICD-10-CM | POA: Insufficient documentation

## 2015-03-04 NOTE — Progress Notes (Signed)
Patient ID: [name], male   DOB: 11-Jul-1951, 64 y.o.   MRN: [id number]  Chief Complaint  Patient presents with  . Mass    left lower leg    HPI [name] is a 64 y.o. male.  Here today for evaluation of a left lower leg mass. He has had this for many years. 5 years ago he had this biopsied and it grew afterwards. It started out as nickel sized and then is now about the size of a 50 cent piece. It is not painful.    HPI  Past Medical History  Diagnosis Date  . Diabetes mellitus   . Hypertension   . Bronchitis   . Colon polyp 2008  . Arthritis     knee    Past Surgical History  Procedure Laterality Date  . Tonsillectomy    . Colonoscopy  2008    Dr Bary Castilla    Family History  Problem Relation Age of Onset  . Stroke Mother   . Stroke Father   . Diabetes Other   . Other Daughter 13    precancerous colon polyp     Social History Social History  Substance Use Topics  . Smoking status: Former Smoker -- 2.00 packs/day for 35 years    Types: Cigarettes, E-cigarettes    Quit date: 12/11/2014  . Smokeless tobacco: Never Used     Comment: Pt still smokes e-cigarettes, occasionally a "drag"  . Alcohol Use: No    Allergies  Allergen Reactions  . Penicillins Rash    Current Outpatient Prescriptions  Medication Sig Dispense Refill  . buPROPion (WELLBUTRIN SR) 150 MG 12 hr tablet   2  . BYDUREON 2 MG SRER   10  . fenofibrate (TRICOR) 145 MG tablet Take 145 mg by mouth daily.    Marland Kitchen GLIPIZIDE XL 5 MG 24 hr tablet   0  . hydrochlorothiazide (HYDRODIURIL) 50 MG tablet Take 50 mg by mouth daily.    . metFORMIN (GLUCOPHAGE) 1000 MG tablet Take 1,000 mg by mouth 2 (two) times daily with a meal.    . ramipril (ALTACE) 10 MG capsule Take 10 mg by mouth 2 (two) times daily.     . sitaGLIPtin (JANUVIA) 100 MG tablet Take 100 mg by mouth daily.     No current facility-administered medications for this visit.    Review of Systems Review of Systems  Constitutional:  Negative.   Respiratory: Negative.   Cardiovascular: Negative.     Blood pressure 130/70, pulse 78, resp. rate 14, height 5\' 10"  (1.778 m), weight 244 lb (110.678 kg).  Physical Exam Physical Exam  Constitutional: He is oriented to person, place, and time. He appears well-developed and well-nourished.  Eyes: Conjunctivae are normal. No scleral icterus.  Neck: Neck supple.  Cardiovascular: Normal rate, regular rhythm and normal heart sounds.   Pulses:      Femoral pulses are 2+ on the right side, and 2+ on the left side.      Dorsalis pedis pulses are 2+ on the right side, and 2+ on the left side.       Posterior tibial pulses are 2+ on the right side, and 2+ on the left side.  No edema.  Pulmonary/Chest: Effort normal and breath sounds normal.  Musculoskeletal:       Legs: Lymphadenopathy:    He has no cervical adenopathy.       Left: No inguinal adenopathy present.  Neurological: He is alert and oriented to person,  place, and time.  Skin: Skin is warm and dry.  3x4 cm mass anterior medial distal calf with a 2.5 cm base.     Data Reviewed Patient reports a biopsy completed in 2011 by Renetta Chalk, M.D. when the lesion was half its present size was that of a lipoma. Attempts will be made to obtain this pathology report. Record request sent.  CBC incomprehensible metabolic panel from earlier this month were reviewed and are unremarkable. Normal renal function. Hematocrit 41. Normal white cell count. Hemoglobin A1c 8.3. Assessment    Enlarging soft tissue mass left lower extremity.  Non-insulin-dependent diabetes with elevated hemoglobin A1c of 8.3.    Plan    The indications for surgical incision and the light of an enlarging lesion was reviewed. The base is at least 2.5 cm in diameter and it may not be possible to affect a primary closure. If necessary a small skin graft will be obtained from the left groin and moved to the anterior medial distal calf.  The need for  elevation of the leg after surgery to minimize swelling/facilitate healing was reviewed. This does not include riding his lawnmower.  Patient's surgery has been scheduled for 03-26-15 at [facility].      PCP:  Ranelle Oyster, [place] 03/04/2015, 11:24 AM

## 2015-03-04 NOTE — Patient Instructions (Signed)
Patient's surgery has been scheduled for 03-26-15 at [facility].

## 2015-03-06 DIAGNOSIS — R2240 Localized swelling, mass and lump, unspecified lower limb: Secondary | ICD-10-CM | POA: Insufficient documentation

## 2015-03-06 NOTE — H&P (Signed)
Patient ID: [name], male   DOB: November 02, 1950, 64 y.o.   MRN: [id number]    Chief Complaint   Patient presents with   .  Mass       left lower leg      HPI [name] is a 64 y.o. male.  Here today for evaluation of a left lower leg mass. He has had this for many years. 5 years ago he had this biopsied and it grew afterwards. It started out as nickel sized and then is now about the size of a 50 cent piece. It is not painful.      HPI    Past Medical History   Diagnosis  Date   .  Diabetes mellitus     .  Hypertension     .  Bronchitis     .  Colon polyp  2008   .  Arthritis         knee       Past Surgical History   Procedure  Laterality  Date   .  Tonsillectomy       .  Colonoscopy    2008       Dr Bary Castilla       Family History   Problem  Relation  Age of Onset   .  Stroke  Mother     .  Stroke  Father     .  Diabetes  Other     .  Other  Daughter  70       precancerous colon polyp       Social History Social History   Substance Use Topics   .  Smoking status:  Former Smoker -- 2.00 packs/day for 35 years       Types:  Cigarettes, E-cigarettes       Quit date:  12/11/2014   .  Smokeless tobacco:  Never Used         Comment: Pt still smokes e-cigarettes, occasionally a "drag"   .  Alcohol Use:  No       Allergies   Allergen  Reactions   .  Penicillins  Rash       Current Outpatient Prescriptions   Medication  Sig  Dispense  Refill   .  buPROPion (WELLBUTRIN SR) 150 MG 12 hr tablet      2   .  BYDUREON 2 MG SRER      10   .  fenofibrate (TRICOR) 145 MG tablet  Take 145 mg by mouth daily.       Marland Kitchen  GLIPIZIDE XL 5 MG 24 hr tablet      0   .  hydrochlorothiazide (HYDRODIURIL) 50 MG tablet  Take 50 mg by mouth daily.       .  metFORMIN (GLUCOPHAGE) 1000 MG tablet  Take 1,000 mg by mouth 2 (two) times daily with a meal.       .  ramipril (ALTACE) 10 MG capsule  Take 10 mg by mouth 2 (two) times daily.        .  sitaGLIPtin (JANUVIA) 100 MG tablet  Take  100 mg by mouth daily.           No current facility-administered medications for this visit.      Review of Systems Review of Systems  Constitutional: Negative.   Respiratory: Negative.   Cardiovascular: Negative.     Blood pressure 130/70, pulse 78, resp. rate 14, height 5\' 10"  (  1.778 m), weight 244 lb (110.678 kg).   Physical Exam Physical Exam  Constitutional: He is oriented to person, place, and time. He appears well-developed and well-nourished.  Eyes: Conjunctivae are normal. No scleral icterus.  Neck: Neck supple.  Cardiovascular: Normal rate, regular rhythm and normal heart sounds.    Pulses:      Femoral pulses are 2+ on the right side, and 2+ on the left side.      Dorsalis pedis pulses are 2+ on the right side, and 2+ on the left side.        Posterior tibial pulses are 2+ on the right side, and 2+ on the left side.  No edema.  Pulmonary/Chest: Effort normal and breath sounds normal.  Musculoskeletal:       Legs:graphic Lymphadenopathy:    He has no cervical adenopathy.       Left: No inguinal adenopathy present.  Neurological: He is alert and oriented to person, place, and time.  Skin: Skin is warm and dry.  3x4 cm mass anterior medial distal calf with a 2.5 cm base.     Data Reviewed Patient reports a biopsy completed in 2011 by Renetta Chalk, M.D. when the lesion was half its present size was that of a lipoma. Attempts will be made to obtain this pathology report. Record request sent.   CBC incomprehensible metabolic panel from earlier this month were reviewed and are unremarkable. Normal renal function. Hematocrit 41. Normal white cell count. Hemoglobin A1c 8.3. Assessment Enlarging soft tissue mass left lower extremity.   Non-insulin-dependent diabetes with elevated hemoglobin A1c of 8.3.   Plan   The indications for surgical incision and the light of an enlarging lesion was reviewed. The base is at least 2.5 cm in diameter and it may not be  possible to affect a primary closure. If necessary a small skin graft will be obtained from the left groin and moved to the anterior medial distal calf.   The need for elevation of the leg after surgery to minimize swelling/facilitate healing was reviewed. This does not include riding his lawnmower.   Patient's surgery has been scheduled for 03-26-15 at [facility].    PCP:  Ranelle Oyster, [place] 03/04/2015, 11:24 AM

## 2015-03-17 ENCOUNTER — Encounter: Payer: Self-pay | Admitting: *Deleted

## 2015-03-17 ENCOUNTER — Other Ambulatory Visit: Payer: Self-pay

## 2015-03-17 NOTE — Patient Instructions (Signed)
  Your procedure is scheduled on:03/26/15 Report to Day Surgery.[facility] SECOND FLOOR To find out your arrival time please call [phone] between 1PM - 3PM on 03/25/15  Remember: Instructions that are not followed completely may result in serious medical risk, up to and including death, or upon the discretion of your surgeon and anesthesiologist your surgery may need to be rescheduled.    __X__ 1. Do not eat food or drink liquids after midnight. No gum chewing or hard candies.     __X__ 2. No Alcohol for 24 hours before or after surgery.   ____ 3. Bring all medications with you on the day of surgery if instructed.    __X__ 4. Notify your doctor if there is any change in your medical condition     (cold, fever, infections).     Do not wear jewelry, make-up, hairpins, clips or nail polish.  Do not wear lotions, powders, or perfumes. You may wear deodorant.  Do not shave 48 hours prior to surgery. Men may shave face and neck.  Do not bring valuables to the hospital.    [facility] is not responsible for any belongings or valuables.               Contacts, dentures or bridgework may not be worn into surgery.  Leave your suitcase in the car. After surgery it may be brought to your room.  For patients admitted to the hospital, discharge time is determined by your                treatment team.   Patients discharged the day of surgery will not be allowed to drive home.   Please read over the following fact sheets that you were given:   Surgical Site Infection Prevention   ___XTake these medicines the morning of surgery with A SIP OF WATER:    1. ALTACE  2.   3.   4.  5.  6.  ____ Fleet Enema (as directed)   _X___ Use CHG Soap as directed  ____ Use inhalers on the day of surgery  __X__ Stop metformin 2 days prior to surgery    ____ Take 1/2 of usual insulin dose the night before surgery and none on the morning of surgery.   ____ Stop Coumadin/Plavix/aspirin on   ____  Stop Anti-inflammatories on   ____ Stop supplements until after surgery.    ____ Bring C-Pap to the hospital.

## 2015-03-18 ENCOUNTER — Encounter
Admission: RE | Admit: 2015-03-18 | Discharge: 2015-03-18 | Disposition: A | Discharge status: Home / Self Care | Payer: BLUE CROSS/BLUE SHIELD | Source: Ambulatory Visit | Attending: Anesthesiology | Admitting: Anesthesiology

## 2015-03-18 DIAGNOSIS — Z01810 Encounter for preprocedural cardiovascular examination: Secondary | ICD-10-CM | POA: Insufficient documentation

## 2015-03-26 ENCOUNTER — Ambulatory Visit: Payer: BLUE CROSS/BLUE SHIELD | Admitting: Anesthesiology

## 2015-03-26 ENCOUNTER — Encounter: Payer: Self-pay | Admitting: *Deleted

## 2015-03-26 ENCOUNTER — Encounter
Admission: RE | Disposition: A | Discharge status: Home / Self Care | Payer: Self-pay | Source: Ambulatory Visit | Attending: General Surgery

## 2015-03-26 ENCOUNTER — Ambulatory Visit
Admission: RE | Admit: 2015-03-26 | Discharge: 2015-03-26 | Disposition: A | Discharge status: Home / Self Care | Payer: BLUE CROSS/BLUE SHIELD | Source: Ambulatory Visit | Attending: General Surgery | Admitting: General Surgery

## 2015-03-26 DIAGNOSIS — I10 Essential (primary) hypertension: Secondary | ICD-10-CM | POA: Insufficient documentation

## 2015-03-26 DIAGNOSIS — R2242 Localized swelling, mass and lump, left lower limb: Secondary | ICD-10-CM

## 2015-03-26 DIAGNOSIS — Z880 Allergy status to penicillin: Secondary | ICD-10-CM | POA: Insufficient documentation

## 2015-03-26 DIAGNOSIS — E119 Type 2 diabetes mellitus without complications: Secondary | ICD-10-CM | POA: Diagnosis not present

## 2015-03-26 DIAGNOSIS — F17210 Nicotine dependence, cigarettes, uncomplicated: Secondary | ICD-10-CM | POA: Insufficient documentation

## 2015-03-26 DIAGNOSIS — D2372 Other benign neoplasm of skin of left lower limb, including hip: Secondary | ICD-10-CM | POA: Diagnosis not present

## 2015-03-26 DIAGNOSIS — M13869 Other specified arthritis, unspecified knee: Secondary | ICD-10-CM | POA: Diagnosis not present

## 2015-03-26 DIAGNOSIS — Z86010 Personal history of colonic polyps: Secondary | ICD-10-CM | POA: Diagnosis not present

## 2015-03-26 HISTORY — PX: LIPOMA EXCISION: SHX5283

## 2015-03-26 HISTORY — PX: OTHER SURGICAL HISTORY: SHX169

## 2015-03-26 LAB — GLUCOSE, CAPILLARY
Glucose-Capillary: 241 mg/dL — ABNORMAL HIGH (ref 65–99)
Glucose-Capillary: 196 mg/dL — ABNORMAL HIGH (ref 65–99)

## 2015-03-26 SURGERY — EXCISION LIPOMA
Anesthesia: General | Wound class: Clean

## 2015-03-26 MED ORDER — PHENYLEPHRINE HCL 10 MG/ML IJ SOLN
INTRAMUSCULAR | Status: DC | PRN
  Administered 2015-03-26 (×4): 100 ug via INTRAVENOUS

## 2015-03-26 MED ORDER — FENTANYL CITRATE (PF) 100 MCG/2ML IJ SOLN: 25.0000 ug | INTRAMUSCULAR | Status: DC | PRN

## 2015-03-26 MED ORDER — LIDOCAINE HCL (CARDIAC) 20 MG/ML IV SOLN
INTRAVENOUS | Status: DC | PRN
  Administered 2015-03-26: 50 mg via INTRAVENOUS

## 2015-03-26 MED ORDER — SODIUM CHLORIDE 0.9 % IV SOLN
INTRAVENOUS | Status: DC
  Administered 2015-03-26 (×2): via INTRAVENOUS

## 2015-03-26 MED ORDER — LIDOCAINE-EPINEPHRINE 1 %-1:100000 IJ SOLN
INTRAMUSCULAR | Status: AC
  Filled 2015-03-26: qty 2

## 2015-03-26 MED ORDER — HYDROCODONE-ACETAMINOPHEN 5-325 MG PO TABS: 1.0000 | ORAL_TABLET | Freq: Four times a day (QID) | ORAL | Status: DC | PRN

## 2015-03-26 MED ORDER — ONDANSETRON HCL 4 MG/2ML IJ SOLN
INTRAMUSCULAR | Status: DC | PRN
  Administered 2015-03-26: 4 mg via INTRAVENOUS

## 2015-03-26 MED ORDER — BUPIVACAINE HCL (PF) 0.5 % IJ SOLN
INTRAMUSCULAR | Status: AC
  Filled 2015-03-26: qty 30

## 2015-03-26 MED ORDER — LIDOCAINE-EPINEPHRINE 1 %-1:100000 IJ SOLN
INTRAMUSCULAR | Status: DC | PRN
  Administered 2015-03-26: 16 mL

## 2015-03-26 MED ORDER — CEFAZOLIN SODIUM-DEXTROSE 2-3 GM-% IV SOLR
2.0000 g | INTRAVENOUS | Status: AC
  Administered 2015-03-26: 2 g via INTRAVENOUS

## 2015-03-26 MED ORDER — FAMOTIDINE 20 MG PO TABS
20.0000 mg | ORAL_TABLET | Freq: Once | ORAL | Status: AC
  Administered 2015-03-26: 20 mg via ORAL

## 2015-03-26 MED ORDER — PROPOFOL 10 MG/ML IV BOLUS
INTRAVENOUS | Status: DC | PRN
  Administered 2015-03-26: 150 mg via INTRAVENOUS

## 2015-03-26 MED ORDER — MIDAZOLAM HCL 2 MG/2ML IJ SOLN
INTRAMUSCULAR | Status: DC | PRN
  Administered 2015-03-26: 2 mg via INTRAVENOUS

## 2015-03-26 MED ORDER — CEFAZOLIN SODIUM-DEXTROSE 2-3 GM-% IV SOLR
INTRAVENOUS | Status: AC
  Administered 2015-03-26: 2 g via INTRAVENOUS
  Filled 2015-03-26: qty 50

## 2015-03-26 MED ORDER — FAMOTIDINE 20 MG PO TABS
ORAL_TABLET | ORAL | Status: AC
  Administered 2015-03-26: 20 mg via ORAL
  Filled 2015-03-26: qty 1

## 2015-03-26 MED ORDER — FENTANYL CITRATE (PF) 100 MCG/2ML IJ SOLN
INTRAMUSCULAR | Status: DC | PRN
  Administered 2015-03-26 (×2): 50 ug via INTRAVENOUS

## 2015-03-26 MED ORDER — LIDOCAINE HCL (PF) 1 % IJ SOLN
INTRAMUSCULAR | Status: AC
Start: 2015-03-26 — End: 2015-03-26
  Filled 2015-03-26: qty 30

## 2015-03-26 MED ORDER — PROPOFOL INFUSION 10 MG/ML OPTIME
INTRAVENOUS | Status: DC | PRN
  Administered 2015-03-26: 75 ug/kg/min via INTRAVENOUS

## 2015-03-26 MED ORDER — SODIUM BICARBONATE 4 % IV SOLN
INTRAVENOUS | Status: AC
  Filled 2015-03-26: qty 5

## 2015-03-26 MED ORDER — PROPOFOL INFUSION 10 MG/ML OPTIME: INTRAVENOUS | Status: DC | PRN

## 2015-03-26 MED ORDER — ONDANSETRON HCL 4 MG/2ML IJ SOLN: 4.0000 mg | Freq: Once | INTRAMUSCULAR | Status: DC | PRN

## 2015-03-26 MED ORDER — BUPIVACAINE HCL 0.5 % IJ SOLN
INTRAMUSCULAR | Status: DC | PRN
  Administered 2015-03-26: 12 mL

## 2015-03-26 MED ORDER — SODIUM BICARBONATE 4 % IV SOLN
INTRAVENOUS | Status: DC | PRN
  Administered 2015-03-26: 2 mL

## 2015-03-26 SURGICAL SUPPLY — 40 items
BLADE DERMATONE (BLADE) IMPLANT
BNDG GAUZE 4.5X4.1 6PLY STRL (MISCELLANEOUS) ×3 IMPLANT
BNDG STRETCH 4X75 STRL LF (GAUZE/BANDAGES/DRESSINGS) ×3 IMPLANT
CANISTER SUCT 1200ML W/VALVE (MISCELLANEOUS) ×3 IMPLANT
CHLORAPREP W/TINT 26ML (MISCELLANEOUS) ×3 IMPLANT
DEPRESSOR TONGUE BLADE STERILE (MISCELLANEOUS) IMPLANT
DERMACARRIER 1-1.5 (MISCELLANEOUS) IMPLANT
DERMACARRIER 3-1 (MISCELLANEOUS) IMPLANT
DRAPE IMP U-DRAPE 54X76 (DRAPES) ×3 IMPLANT
DRAPE LAPAROTOMY 100X77 ABD (DRAPES) ×3 IMPLANT
DRSG EMULSION OIL 3X3 NADH (GAUZE/BANDAGES/DRESSINGS) IMPLANT
DRSG TEGADERM 4X4.75 (GAUZE/BANDAGES/DRESSINGS) ×3 IMPLANT
DRSG TEGADERM 6X8 (GAUZE/BANDAGES/DRESSINGS) IMPLANT
DRSG TELFA 3X8 NADH (GAUZE/BANDAGES/DRESSINGS) IMPLANT
GAUZE PETRO XEROFOAM 5X9 (MISCELLANEOUS) IMPLANT
GAUZE SPONGE 4X4 12PLY STRL (GAUZE/BANDAGES/DRESSINGS) ×3 IMPLANT
GLOVE BIO SURGEON STRL SZ7.5 (GLOVE) ×6 IMPLANT
GLOVE INDICATOR 8.0 STRL GRN (GLOVE) ×6 IMPLANT
GOWN STRL REUS W/ TWL LRG LVL3 (GOWN DISPOSABLE) ×6 IMPLANT
GOWN STRL REUS W/TWL LRG LVL3 (GOWN DISPOSABLE) ×3
KIT RM TURNOVER STRD PROC AR (KITS) ×3 IMPLANT
LABEL OR SOLS (LABEL) ×3 IMPLANT
NEEDLE HYPO 25X1 1.5 SAFETY (NEEDLE) ×3 IMPLANT
NS IRRIG 500ML POUR BTL (IV SOLUTION) ×3 IMPLANT
PACK BASIN MINOR ARMC (MISCELLANEOUS) ×3 IMPLANT
PAD GROUND ADULT SPLIT (MISCELLANEOUS) ×3 IMPLANT
STAPLER SKIN PROX 35W (STAPLE) IMPLANT
SUT ETHILON 3-0 KS 30 BLK (SUTURE) ×3 IMPLANT
SUT ETHILON 4-0 (SUTURE) ×1
SUT ETHILON 4-0 FS2 18XMFL BLK (SUTURE) ×2
SUT SILK 3-0 (SUTURE) IMPLANT
SUT VIC AB 3-0 54X BRD REEL (SUTURE) ×2 IMPLANT
SUT VIC AB 3-0 BRD 54 (SUTURE) ×1
SUT VIC AB 3-0 SH 27 (SUTURE) ×1
SUT VIC AB 3-0 SH 27X BRD (SUTURE) ×2 IMPLANT
SUT VIC AB 4-0 FS2 27 (SUTURE) ×3 IMPLANT
SUT VICRYL+ 3-0 27IN RB-1 (SUTURE) IMPLANT
SUTURE ETHLN 4-0 FS2 18XMF BLK (SUTURE) ×2 IMPLANT
SYR BULB IRRIG 60ML STRL (SYRINGE) IMPLANT
SYR CONTROL 10ML (SYRINGE) IMPLANT

## 2015-03-26 NOTE — Op Note (Signed)
Preoperative diagnosis: Left lower extremity mass.  Postoperative diagnosis: Same.  Operative procedure: Excision of left lower extremity mass with primary closure.  Operating surgeon: Hervey Ard, M.D.  Anesthesia: Gen. by LMA, 0.5% Xylocaine with 0.25% Marcaine with epinephrine, 30 mL local infiltration.  Estimated blood loss: 5 mL.  Clinical note this 64 year old male has a mass on the left anterior medial ankle that has gradually increased over  several years. By report a previous biopsy showed evidence of a lipoma. He's been her for elective excision and possible skin graft closure.  Operative note: The patient under went general anesthesia after local anesthesia with sedation was inadequate. The area was prepped with chlor prep and drape. The groin was prepped separately with chlor prep. The area was excised through a long elliptical incision. The specimen was orientated with a nylon suture at the 12:00 position and sent in formalin for routine histology. Skin flaps were then elevated circumferentially for a distance of 5 cm. He missed a saw was electrocautery. 3-0 Vicryls ties as needed.  The deep dermal/adipose layer was approximated with interrupted 3-0 Vicryls figure-of-eight sutures. In the midportion of the wound where the massive been excised the skin was approximated with interrupted 4-0 nylon sutures. The superior and inferior ends were closed with a running 4-0 nylon suture. Telfa and Tegaderm applied. Fluff gauze, Kerlix and a strap applied.  The patient tolerated the procedure well and was brought to recovery in stable condition.

## 2015-03-26 NOTE — Discharge Instructions (Signed)

## 2015-03-26 NOTE — Anesthesia Preprocedure Evaluation (Signed)
Anesthesia Evaluation  Patient identified by MRN, date of birth, ID band Patient awake    Reviewed: Allergy & Precautions, H&P , NPO status , Patient's Chart, lab work & pertinent test results, reviewed documented beta blocker date and time   Airway Mallampati: II  TM Distance: >3 FB Neck ROM: full    Dental   Pulmonary former smoker,           Cardiovascular hypertension, Normal cardiovascular exam Rate:Normal     Neuro/Psych    GI/Hepatic   Endo/Other  diabetes  Renal/GU      Musculoskeletal   Abdominal   Peds  Hematology   Anesthesia Other Findings   Reproductive/Obstetrics                             Anesthesia Physical Anesthesia Plan  ASA: III  Anesthesia Plan: General LMA and General   Post-op Pain Management:    Induction:   Airway Management Planned:   Additional Equipment:   Intra-op Plan:   Post-operative Plan:   Informed Consent: I have reviewed the patients History and Physical, chart, labs and discussed the procedure including the risks, benefits and alternatives for the proposed anesthesia with the patient or authorized representative who has indicated his/her understanding and acceptance.     Plan Discussed with: CRNA  Anesthesia Plan Comments:         Anesthesia Quick Evaluation

## 2015-03-26 NOTE — H&P (Signed)
  [name] [id number] February 24, 1951     HPI: Pedunculated mass on the left anterior medial ankle. For excision.    @ENCMED @ Allergies  Allergen Reactions  . Penicillins Rash   Past Medical History  Diagnosis Date  . Diabetes mellitus   . Hypertension   . Bronchitis   . Colon polyp 2008  . Arthritis     knee   Past Surgical History  Procedure Laterality Date  . Tonsillectomy    . Colonoscopy  2008, 2016    Dr Bary Castilla   Social History   Social History  . Marital Status: Married    Spouse Name: N/A  . Number of Children: N/A  . Years of Education: N/A   Occupational History  . Not on file.   Social History Main Topics  . Smoking status: Former Smoker -- 2.00 packs/day for 35 years    Types: Cigarettes, E-cigarettes    Quit date: 12/11/2014  . Smokeless tobacco: Never Used     Comment: Pt still smokes e-cigarettes, occasionally a "drag"  . Alcohol Use: No  . Drug Use: No  . Sexual Activity: Not on file   Other Topics Concern  . Not on file   Social History Narrative   Social History   Social History Narrative     ROS: Negative.     PE: HEENT: Negative. Lungs: Clear. Cardio: RR. Robert Bellow 03/26/2015   Assessment/Plan:  Proceed with planned excision. Possibility of need for skin graft coverage reviewed at original visit.

## 2015-03-26 NOTE — Transfer of Care (Signed)
Immediate Anesthesia Transfer of Care Note  Patient: [name]  Procedure(s) Performed: Procedure(s): Excision mass left leg (N/A)  Patient Location: PACU  Anesthesia Type:General  Level of Consciousness: sedated  Airway & Oxygen Therapy: Patient Spontanous Breathing and Patient connected to nasal cannula oxygen  Post-op Assessment: Report given to RN and Post -op Vital signs reviewed and stable  Post vital signs: Reviewed and stable  Last Vitals:  Filed Vitals:   03/26/15 0855  BP: 150/83  Pulse: 76  Temp: 36.5 C  Resp: 16    Complications: No apparent anesthesia complications

## 2015-03-28 NOTE — Anesthesia Postprocedure Evaluation (Signed)
  Anesthesia Post-op Note  Patient: [name]  Procedure(s) Performed: Procedure(s): Excision mass left leg (N/A)  Anesthesia type:General  Patient location: PACU  Post pain: Pain level controlled  Post assessment: Post-op Vital signs reviewed, Patient's Cardiovascular Status Stable, Respiratory Function Stable, Patent Airway and No signs of Nausea or vomiting  Post vital signs: Reviewed and stable  Last Vitals:  Filed Vitals:   03/26/15 1245  BP: 131/71  Pulse: 74  Temp:   Resp: 16    Level of consciousness: awake, alert  and patient cooperative  Complications: No apparent anesthesia complications

## 2015-03-31 ENCOUNTER — Ambulatory Visit (INDEPENDENT_AMBULATORY_CARE_PROVIDER_SITE_OTHER): Payer: BLUE CROSS/BLUE SHIELD | Admitting: General Surgery

## 2015-03-31 ENCOUNTER — Encounter: Payer: Self-pay | Admitting: General Surgery

## 2015-03-31 VITALS — BP 136/76 | HR 88 | Resp 14 | Ht 70.0 in | Wt 241.0 lb

## 2015-03-31 DIAGNOSIS — D2372 Other benign neoplasm of skin of left lower limb, including hip: Secondary | ICD-10-CM

## 2015-03-31 DIAGNOSIS — R2242 Localized swelling, mass and lump, left lower limb: Secondary | ICD-10-CM

## 2015-03-31 NOTE — Progress Notes (Signed)
Patient ID: [name], male   DOB: [birth year], 64 y.o.   MRN: [id number]  Chief Complaint  Patient presents with  . Routine Post Op    Left foot    HPI [name] is a 64 y.o. male here today for post op of excision of left lower extremity mass with primary closure was done on 03/26/15. Patient states he is doing well, Sunday he was in a lot of pain. He has been taking Advil for pain. Denies any dietary or bowel issues.  HPI  Past Medical History  Diagnosis Date  . Diabetes mellitus   . Hypertension   . Bronchitis   . Colon polyp 2008  . Arthritis     knee    Past Surgical History  Procedure Laterality Date  . Tonsillectomy    . Colonoscopy  2008, 2016    Dr Bary Castilla  . Lipoma excision N/A 03/26/2015    Procedure: Excision mass left leg;  Surgeon: Robert Bellow, MD;  Location: [facility] ORS;  Service: General;  Laterality: N/A;    Family History  Problem Relation Age of Onset  . Stroke Mother   . Stroke Father   . Diabetes Other   . Other Daughter 16    precancerous colon polyp     Social History Social History  Substance Use Topics  . Smoking status: Former Smoker -- 2.00 packs/day for 35 years    Types: Cigarettes, E-cigarettes    Quit date: 12/11/2014  . Smokeless tobacco: Never Used     Comment: Pt still smokes e-cigarettes, occasionally a "drag"  . Alcohol Use: No    Allergies  Allergen Reactions  . Penicillins Rash    Current Outpatient Prescriptions  Medication Sig Dispense Refill  . BYDUREON 2 MG SRER   10  . fenofibrate (TRICOR) 145 MG tablet Take 145 mg by mouth daily.    Marland Kitchen GLIPIZIDE XL 5 MG 24 hr tablet   0  . hydrochlorothiazide (HYDRODIURIL) 50 MG tablet Take 50 mg by mouth daily.    Marland Kitchen HYDROcodone-acetaminophen (NORCO) 5-325 MG per tablet Take 1-2 tablets by mouth every 6 (six) hours as needed for moderate pain or severe pain. 30 tablet 0  . metFORMIN (GLUCOPHAGE) 1000 MG tablet Take 1,000 mg by mouth 2 (two) times daily with a meal.    .  ramipril (ALTACE) 10 MG capsule Take 10 mg by mouth 2 (two) times daily.     . sitaGLIPtin (JANUVIA) 100 MG tablet Take 100 mg by mouth daily.     No current facility-administered medications for this visit.    Review of Systems Review of Systems  Constitutional: Negative.   Respiratory: Negative.   Cardiovascular: Negative.     Blood pressure 136/76, pulse 88, resp. rate 14, height 5\' 10"  (1.778 m), weight 241 lb (109.317 kg).  Physical Exam Physical Exam  Constitutional: He appears well-developed and well-nourished.  Eyes: No scleral icterus.  Musculoskeletal:       Legs:   Data Reviewed DIAGNOSIS:  A. LEFT LEG MASS; EXCISION:  - DERMATOFIBROMA, SEE NOTE.  - FOCAL HISTOLOGIC CHANGES CONSISTENT WITH PRIOR BIOPSY.   Assessment    Doing well status post excision left lower extremity dermatofibroma with primary closure.    Plan    The patient will gradually increase his activities. 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 I don't think a be able to do this for another week or so.  Can shower, can drive.   Return on Tuesday to remove sutures.   PCP: Etheleen Mayhew 04/01/2015, 1:32 PM

## 2015-04-01 DIAGNOSIS — D2372 Other benign neoplasm of skin of left lower limb, including hip: Secondary | ICD-10-CM | POA: Insufficient documentation

## 2015-04-06 ENCOUNTER — Encounter: Payer: Self-pay | Admitting: General Surgery

## 2015-04-06 ENCOUNTER — Ambulatory Visit (INDEPENDENT_AMBULATORY_CARE_PROVIDER_SITE_OTHER): Payer: BLUE CROSS/BLUE SHIELD | Admitting: General Surgery

## 2015-04-06 VITALS — BP 152/86 | HR 84 | Resp 16 | Ht 70.0 in | Wt 249.0 lb

## 2015-04-06 DIAGNOSIS — R2242 Localized swelling, mass and lump, left lower limb: Secondary | ICD-10-CM

## 2015-04-06 DIAGNOSIS — D2372 Other benign neoplasm of skin of left lower limb, including hip: Secondary | ICD-10-CM

## 2015-04-06 NOTE — Patient Instructions (Signed)
Patient to return in one week. 

## 2015-04-06 NOTE — Progress Notes (Signed)
Patient ID: [name], male   DOB: 05/25/1951, 64 y.o.   MRN: [id number]  Chief Complaint  Patient presents with  . Follow-up    left leg excision    HPI [name] is a 64 y.o. male here today for his follow up left leg excision done on 03/26/15. Patient states the dressing was removed on 04/04/15 and some of the area has been draining.  HPI  Past Medical History  Diagnosis Date  . Diabetes mellitus   . Hypertension   . Bronchitis   . Colon polyp 2008  . Arthritis     knee    Past Surgical History  Procedure Laterality Date  . Tonsillectomy    . Colonoscopy  2008, 2016    Dr Bary Castilla  . Lipoma excision N/A 03/26/2015       . Dermatofibroma  03/26/15    Procedure: Excision mass left leg;  Surgeon: Robert Bellow, MD;  Location: [facility] ORS;  Service: General;  Laterality: N/A;    Family History  Problem Relation Age of Onset  . Stroke Mother   . Stroke Father   . Diabetes Other   . Other Daughter 67    precancerous colon polyp     Social History Social History  Substance Use Topics  . Smoking status: Former Smoker -- 2.00 packs/day for 35 years    Types: Cigarettes, E-cigarettes    Quit date: 12/11/2014  . Smokeless tobacco: Never Used     Comment: Pt still smokes e-cigarettes, occasionally a "drag"  . Alcohol Use: No    Allergies  Allergen Reactions  . Penicillins Rash    Current Outpatient Prescriptions  Medication Sig Dispense Refill  . BYDUREON 2 MG SRER   10  . fenofibrate (TRICOR) 145 MG tablet Take 145 mg by mouth daily.    Marland Kitchen GLIPIZIDE XL 5 MG 24 hr tablet   0  . hydrochlorothiazide (HYDRODIURIL) 50 MG tablet Take 50 mg by mouth daily.    Marland Kitchen HYDROcodone-acetaminophen (NORCO) 5-325 MG per tablet Take 1-2 tablets by mouth every 6 (six) hours as needed for moderate pain or severe pain. 30 tablet 0  . metFORMIN (GLUCOPHAGE) 1000 MG tablet Take 1,000 mg by mouth 2 (two) times daily with a meal.    . ramipril (ALTACE) 10 MG capsule Take 10 mg by mouth 2  (two) times daily.     . sitaGLIPtin (JANUVIA) 100 MG tablet Take 100 mg by mouth daily.     No current facility-administered medications for this visit.    Review of Systems Review of Systems  Constitutional: Negative.   Respiratory: Negative.   Cardiovascular: Negative.     Blood pressure 152/86, pulse 84, resp. rate 16, height 5\' 10"  (1.778 m), weight 249 lb (112.946 kg).  Physical Exam Physical Exam  Constitutional: He is oriented to person, place, and time. He appears well-developed and well-nourished.  Musculoskeletal:       Feet:  Neurological: He is alert and oriented to person, place, and time.  Skin: Skin is warm.   Proximal and distal running sutures removed. Every other interrupted suture removed. Benzoin Steri-Strips applied. Compressive wrap placed.     Assessment    Edema secondary to prolonged dependent position without appropriate compressive support.    Plan    The patient reports she is still unable to wear a boot, and for that reason is unable to return to work. A compressive wrap was applied and his wife was instructed in how  to replace this on an every other day basis. He may continue to shower when the wrap is removed. Keeping the leg elevated has been strongly encouraged. There is increased resistance to venous return because of the resection, but this should be overcome with a compressive wrap.    Patient to return in one week. PCP:  Wynn Banker 04/07/2015, 6:34 AM

## 2015-04-07 ENCOUNTER — Encounter: Payer: Self-pay | Admitting: General Surgery

## 2015-04-07 DIAGNOSIS — R2240 Localized swelling, mass and lump, unspecified lower limb: Secondary | ICD-10-CM | POA: Insufficient documentation

## 2015-04-09 LAB — SURGICAL PATHOLOGY

## 2015-04-13 ENCOUNTER — Telehealth: Payer: Self-pay | Admitting: *Deleted

## 2015-04-13 NOTE — Telephone Encounter (Signed)
No need to give [employer] any information. [employer] would be the ones that may need information.

## 2015-04-13 NOTE — Telephone Encounter (Signed)
Patient wants to talk to you regarding some FMLA questions.

## 2015-04-14 ENCOUNTER — Encounter: Payer: Self-pay | Admitting: General Surgery

## 2015-04-14 ENCOUNTER — Ambulatory Visit (INDEPENDENT_AMBULATORY_CARE_PROVIDER_SITE_OTHER): Payer: BLUE CROSS/BLUE SHIELD | Admitting: General Surgery

## 2015-04-14 VITALS — BP 150/82 | HR 88 | Resp 14 | Ht 70.0 in | Wt 250.0 lb

## 2015-04-14 DIAGNOSIS — R2242 Localized swelling, mass and lump, left lower limb: Secondary | ICD-10-CM

## 2015-04-14 NOTE — Patient Instructions (Signed)
May use wrap during the day. Follow up with nurse next week. If continues to improve follow up as needed. The patient is aware to call back for any questions or concerns.

## 2015-04-14 NOTE — Progress Notes (Signed)
Patient ID: [name], male   DOB: 1951/02/19, 64 y.o.   MRN: [id number]  Chief Complaint  Patient presents with  . Follow-up    HPI [name] is a 64 y.o. male.  here today for his follow up left leg excision done on 03/26/15. He states the foot is feeling better and the swelling is less.   HPI  Past Medical History  Diagnosis Date  . Diabetes mellitus   . Hypertension   . Bronchitis   . Colon polyp 2008  . Arthritis     knee    Past Surgical History  Procedure Laterality Date  . Tonsillectomy    . Colonoscopy  2008, 2016    Dr Bary Castilla  . Lipoma excision N/A 03/26/2015       . Dermatofibroma  03/26/15    Procedure: Excision mass left leg;  Surgeon: Robert Bellow, MD;  Location: [facility];  Service: General;  Laterality: N/A;    Family History  Problem Relation Age of Onset  . Stroke Mother   . Stroke Father   . Diabetes Other   . Other Daughter 34    precancerous colon polyp     Social History Social History  Substance Use Topics  . Smoking status: Former Smoker -- 2.00 packs/day for 35 years    Types: Cigarettes, E-cigarettes    Quit date: 12/11/2014  . Smokeless tobacco: Never Used     Comment: Pt still smokes e-cigarettes, occasionally a "drag"  . Alcohol Use: No    Allergies  Allergen Reactions  . Penicillins Rash    Current Outpatient Prescriptions  Medication Sig Dispense Refill  . BYDUREON 2 MG SRER   10  . fenofibrate (TRICOR) 145 MG tablet Take 145 mg by mouth daily.    Marland Kitchen GLIPIZIDE XL 5 MG 24 hr tablet   0  . hydrochlorothiazide (HYDRODIURIL) 50 MG tablet Take 50 mg by mouth daily.    . metFORMIN (GLUCOPHAGE) 1000 MG tablet Take 1,000 mg by mouth 2 (two) times daily with a meal.    . ramipril (ALTACE) 10 MG capsule Take 10 mg by mouth 2 (two) times daily.     . sitaGLIPtin (JANUVIA) 100 MG tablet Take 100 mg by mouth daily.     No current facility-administered medications for this visit.    Review of Systems Review of Systems   Constitutional: Negative.   Respiratory: Negative.   Cardiovascular: Negative.     Blood pressure 150/82, pulse 88, resp. rate 14, height 5\' 10"  (1.778 m), weight 250 lb (113.399 kg).  Physical Exam Physical Exam  Constitutional: He is oriented to person, place, and time. He appears well-developed and well-nourished.  Musculoskeletal:       Legs: Neurological: He is alert and oriented to person, place, and time.  Skin: Skin is warm and dry.  Sutures removed, steri strips applied. Redness is down and wound healing well.  Psychiatric: His behavior is normal.    Data Reviewed Final pathology showed margins clear. Dermatofibroma.  Assessment    Doing well status post excision lower extremity dermatofibroma with primary closure.    Plan         The patient should  continue to use the compressive wrap during the day. Follow up with nurse next week. If continues to improve follow up as needed. The patient is aware to call back for any questions or concerns. Anticipate return to work Tuesday, 04/20/2015 after nursing evaluation.     PCP:  Margit Hanks W 04/14/2015, 9:42 AM

## 2015-04-20 ENCOUNTER — Ambulatory Visit (INDEPENDENT_AMBULATORY_CARE_PROVIDER_SITE_OTHER): Payer: BLUE CROSS/BLUE SHIELD | Admitting: *Deleted

## 2015-04-20 ENCOUNTER — Telehealth: Payer: Self-pay | Admitting: Family Medicine

## 2015-04-20 ENCOUNTER — Other Ambulatory Visit: Payer: Self-pay

## 2015-04-20 DIAGNOSIS — D2372 Other benign neoplasm of skin of left lower limb, including hip: Secondary | ICD-10-CM

## 2015-04-20 DIAGNOSIS — E119 Type 2 diabetes mellitus without complications: Secondary | ICD-10-CM

## 2015-04-20 DIAGNOSIS — Z794 Long term (current) use of insulin: Principal | ICD-10-CM

## 2015-04-20 MED ORDER — BYDUREON 2 MG ~~LOC~~ SRER: 2.0000 mg | SUBCUTANEOUS | Status: DC

## 2015-04-20 NOTE — Progress Notes (Signed)
Patient came in today for a wound check.  The wound is clean, with no signs of infection noted. One small open area noted, apply neosporin and gauze per Dr Bary Castilla. Mild edema noted. Follow up as needed.

## 2015-04-20 NOTE — Patient Instructions (Addendum)
The patient is aware to call back for any questions or concerns. Neosporin and gauze daily.

## 2015-04-28 ENCOUNTER — Other Ambulatory Visit: Payer: Self-pay

## 2015-04-28 DIAGNOSIS — E119 Type 2 diabetes mellitus without complications: Secondary | ICD-10-CM

## 2015-04-28 MED ORDER — GLIPIZIDE XL 5 MG PO TB24: 5.0000 mg | ORAL_TABLET | Freq: Every day | ORAL | Status: DC

## 2015-05-28 ENCOUNTER — Encounter: Payer: Self-pay | Admitting: Family Medicine

## 2015-05-28 ENCOUNTER — Ambulatory Visit (INDEPENDENT_AMBULATORY_CARE_PROVIDER_SITE_OTHER): Payer: BLUE CROSS/BLUE SHIELD | Admitting: Family Medicine

## 2015-05-28 ENCOUNTER — Other Ambulatory Visit: Payer: Self-pay

## 2015-05-28 VITALS — BP 128/66 | HR 88 | Temp 98.2°F | Resp 16 | Wt 253.0 lb

## 2015-05-28 DIAGNOSIS — K219 Gastro-esophageal reflux disease without esophagitis: Secondary | ICD-10-CM | POA: Insufficient documentation

## 2015-05-28 DIAGNOSIS — K5909 Other constipation: Secondary | ICD-10-CM | POA: Insufficient documentation

## 2015-05-28 DIAGNOSIS — M1990 Unspecified osteoarthritis, unspecified site: Secondary | ICD-10-CM | POA: Insufficient documentation

## 2015-05-28 DIAGNOSIS — Z720 Tobacco use: Secondary | ICD-10-CM | POA: Insufficient documentation

## 2015-05-28 DIAGNOSIS — E7800 Pure hypercholesterolemia, unspecified: Secondary | ICD-10-CM | POA: Insufficient documentation

## 2015-05-28 DIAGNOSIS — E119 Type 2 diabetes mellitus without complications: Secondary | ICD-10-CM | POA: Diagnosis not present

## 2015-05-28 DIAGNOSIS — I10 Essential (primary) hypertension: Secondary | ICD-10-CM | POA: Insufficient documentation

## 2015-05-28 DIAGNOSIS — Z8614 Personal history of Methicillin resistant Staphylococcus aureus infection: Secondary | ICD-10-CM | POA: Insufficient documentation

## 2015-05-28 DIAGNOSIS — J309 Allergic rhinitis, unspecified: Secondary | ICD-10-CM | POA: Insufficient documentation

## 2015-05-28 DIAGNOSIS — J449 Chronic obstructive pulmonary disease, unspecified: Secondary | ICD-10-CM | POA: Insufficient documentation

## 2015-05-28 DIAGNOSIS — Z6841 Body Mass Index (BMI) 40.0 and over, adult: Secondary | ICD-10-CM

## 2015-05-28 DIAGNOSIS — E039 Hypothyroidism, unspecified: Secondary | ICD-10-CM | POA: Insufficient documentation

## 2015-05-28 LAB — POCT GLYCOSYLATED HEMOGLOBIN (HGB A1C)
Est. average glucose Bld gHb Est-mCnc: 220
Hemoglobin A1C: 9.3

## 2015-05-28 MED ORDER — GLIPIZIDE ER 10 MG PO TB24: 10.0000 mg | ORAL_TABLET | Freq: Every day | ORAL | Status: DC

## 2015-05-28 NOTE — Progress Notes (Addendum)
Subjective:    Patient ID: [name], male    DOB: [birth year], 64 y.o.   MRN: [name]  Diabetes He presents for his follow-up (last A1C 02/19/2015 and was 8.3%) diabetic visit. Hypoglycemia symptoms include headaches. Pertinent negatives for hypoglycemia include no sweats. Associated symptoms include fatigue, polyphagia and polyuria. Pertinent negatives for diabetes include no blurred vision, no chest pain, no foot paresthesias, no foot ulcerations, no polydipsia, no visual change, no weakness and no weight loss. Current diabetic treatment includes oral agent (triple therapy) and diet (Metformin 1000 mg BID, Glipizide 5 mg, Januvia 100 mg, as well as Bydureon). He is compliant with treatment all of the time. He is following a generally healthy diet. He never (Pt recently had surgery and has been out of work since 03/26/2015) participates in exercise. His home blood glucose trend is increasing steadily. His breakfast blood glucose range is generally >200 mg/dl. An ACE inhibitor/angiotensin II receptor blocker is being taken (Ramipril 10 mg). Eye exam is current (about 1 year ago with [facility]).  Hypertension This is a chronic problem. Associated symptoms include headaches and malaise/fatigue. Pertinent negatives include no anxiety, blurred vision, chest pain, neck pain, orthopnea, palpitations, peripheral edema, shortness of breath or sweats. Treatments tried: Ramipril 10 mg, HCTZ 50 mg. The current treatment provides moderate improvement. There are no compliance problems.    Had surgery on his leg.  Has not been as active. Knew his blood sugar has been up.  Is still healing.    Review of Systems  Constitutional: Positive for malaise/fatigue and fatigue. Negative for weight loss.  Eyes: Negative for blurred vision.  Respiratory: Negative for shortness of breath.   Cardiovascular: Negative for chest pain, palpitations and orthopnea.  Endocrine: Positive for polyphagia and polyuria.  Negative for polydipsia.  Musculoskeletal: Negative for neck pain.  Neurological: Positive for headaches. Negative for weakness.   BP 128/66 mmHg  Pulse 88  Temp(Src) 98.2 F (36.8 C) (Oral)  Resp 16  Wt 253 lb (114.76 kg)   Patient Active Problem List   Diagnosis Date Noted  . Allergic rhinitis 05/28/2015  . Arthritis 05/28/2015  . Adult BMI 30+ 05/28/2015  . Chronic constipation 05/28/2015  . Chronic obstructive pulmonary disease ([place]) 05/28/2015  . Type 2 diabetes mellitus ([place]) 05/28/2015  . Essential (primary) hypertension 05/28/2015  . History of methicillin resistant Staphylococcus aureus infection 05/28/2015  . Hypercholesterolemia 05/28/2015  . Gastro-esophageal reflux disease without esophagitis 05/28/2015  . Subclinical hypothyroidism 05/28/2015  . Current tobacco use 05/28/2015  . Dermatofibroma of left lower leg 04/01/2015  . Former smoker 03/04/2015  . Hypertension 02/19/2015  . Diabetes ([place]) 12/18/2014  . Hyperlipidemia 12/18/2014  . History of colonic polyps 08/20/2014  . Idiopathic localized osteoarthropathy 03/16/2014   Past Medical History  Diagnosis Date  . Diabetes mellitus   . Hypertension   . Bronchitis   . Colon polyp 2008  . Arthritis     knee   Current Outpatient Prescriptions on File Prior to Visit  Medication Sig  . BYDUREON 2 MG SRER Inject 2 mg into the skin once a week.  . fenofibrate (TRICOR) 145 MG tablet Take 145 mg by mouth daily.  Marland Kitchen GLIPIZIDE XL 5 MG 24 hr tablet Take 1 tablet (5 mg total) by mouth daily with breakfast.  . hydrochlorothiazide (HYDRODIURIL) 50 MG tablet Take 50 mg by mouth daily.  . metFORMIN (GLUCOPHAGE) 1000 MG tablet Take 1,000 mg by mouth 2 (two) times daily with a meal.  .  ramipril (ALTACE) 10 MG capsule Take 10 mg by mouth 2 (two) times daily.   . sitaGLIPtin (JANUVIA) 100 MG tablet Take 100 mg by mouth daily.   No current facility-administered medications on file prior to visit.   Allergies  Allergen  Reactions  . Atorvastatin     Other reaction(s): Headache  . Penicillins Rash   Past Surgical History  Procedure Laterality Date  . Tonsillectomy    . Colonoscopy  2008, 2016    Dr Bary Castilla  . Lipoma excision N/A 03/26/2015       . Dermatofibroma  03/26/15    Procedure: Excision mass left leg;  Surgeon: Robert Bellow, MD;  Location: [facility];  Service: General;  Laterality: N/A;   Social History   Social History  . Marital Status: Married    Spouse Name: N/A  . Number of Children: N/A  . Years of Education: N/A   Occupational History  . Not on file.   Social History Main Topics  . Smoking status: Former Smoker -- 2.00 packs/day for 35 years    Types: Cigarettes, E-cigarettes    Quit date: 12/11/2014  . Smokeless tobacco: Never Used     Comment: Pt still smokes e-cigarettes, occasionally a "drag"  . Alcohol Use: No  . Drug Use: No  . Sexual Activity: Not on file   Other Topics Concern  . Not on file   Social History Narrative   Family History  Problem Relation Age of Onset  . Stroke Mother   . Stroke Father   . Diabetes Other   . Other Daughter 25    precancerous colon polyp       Objective:   Physical Exam  Constitutional: He is oriented to person, place, and time. He appears well-developed and well-nourished.  Cardiovascular: Normal rate and regular rhythm.   Pulmonary/Chest: Effort normal and breath sounds normal.  Neurological: He is alert and oriented to person, place, and time.  Psychiatric: He has a normal mood and affect. His behavior is normal. Judgment and thought content normal.   BP 128/66 mmHg  Pulse 88  Temp(Src) 98.2 F (36.8 C) (Oral)  Resp 16  Wt 253 lb (114.76 kg)     Assessment & Plan:  1. Type 2 diabetes mellitus without complication, without long-term current use of insulin (HCC) Not at goal. Will increase Glipizide. Suspect may need to start insulin. Will monitor blood sugars and call if not improving.  - POCT glycosylated  hemoglobin (Hb A1C) - glipiZIDE (GLUCOTROL XL) 10 MG 24 hr tablet; Take 1 tablet (10 mg total) by mouth daily with breakfast.  Dispense: 30 tablet; Refill: 5  Results for orders placed or performed in visit on 05/28/15  POCT glycosylated hemoglobin (Hb A1C)  Result Value Ref Range   Hemoglobin A1C 9.3    Est. average glucose Bld gHb Est-mCnc 220     Margarita Rana, MD

## 2015-05-28 NOTE — Addendum Note (Signed)
Addended by: Jerrell Belfast on: 05/28/2015 10:46 AM   Modules accepted: Miquel Dunn

## 2015-06-04 ENCOUNTER — Encounter: Payer: Self-pay | Admitting: Family Medicine

## 2015-06-04 ENCOUNTER — Ambulatory Visit (INDEPENDENT_AMBULATORY_CARE_PROVIDER_SITE_OTHER): Payer: BLUE CROSS/BLUE SHIELD | Admitting: Family Medicine

## 2015-06-04 VITALS — BP 152/82 | HR 96 | Temp 98.2°F | Resp 16 | Wt 253.0 lb

## 2015-06-04 DIAGNOSIS — M174 Other bilateral secondary osteoarthritis of knee: Secondary | ICD-10-CM | POA: Diagnosis not present

## 2015-06-04 NOTE — Progress Notes (Signed)
Subjective:    Patient ID: [name], male    DOB: [birth year], 64 y.o.   MRN: [name]  Knee Pain  The incident occurred at work (Originally. ). The pain is present in the left knee. The quality of the pain is described as shooting, stabbing and aching. The pain is at a severity of 10/10. The pain is severe. The pain has been worsening since onset. Associated symptoms include an inability to bear weight, a loss of motion and muscle weakness. Pertinent negatives include no loss of sensation, numbness or tingling. The symptoms are aggravated by weight bearing. Treatments tried: brace, injection. Did not help. Has been seeing  Dr. Tamala Julian.  The treatment provided no relief.  Pt comes in with short-term disability papers to be filled out for this problem.   Has seen a lawyer about this.  Currently being treated by Dr. Tamala Julian. Has osteoarthritis in both knees and it is recommended that he get bilateral knee replacements.   Does needs his short term disability paper work filled out.  Unclear if worker's comp case or long term disability.    Can not currently return to work and preform job description as above. Difficulty ambulation at baseline. Is currently a truck driver.  Can not climb into his truck.  Also, can not bend, stoop, lift or crawl also related to his knees.     Review of Systems  Constitutional: Positive for activity change. Negative for fever, chills, diaphoresis, appetite change, fatigue and unexpected weight change.  Musculoskeletal: Positive for arthralgias.  Neurological: Negative for tingling and numbness.   Patient Active Problem List   Diagnosis Date Noted  . Allergic rhinitis 05/28/2015  . Arthritis 05/28/2015  . Adult BMI 30+ 05/28/2015  . Chronic constipation 05/28/2015  . Chronic obstructive pulmonary disease ([place]) 05/28/2015  . Type 2 diabetes mellitus ([place]) 05/28/2015  . Essential (primary) hypertension 05/28/2015  . History of methicillin resistant Staphylococcus  aureus infection 05/28/2015  . Hypercholesterolemia 05/28/2015  . Gastro-esophageal reflux disease without esophagitis 05/28/2015  . Subclinical hypothyroidism 05/28/2015  . Current tobacco use 05/28/2015  . Dermatofibroma of left lower leg 04/01/2015  . Former smoker 03/04/2015  . Hypertension 02/19/2015  . Diabetes ([place]) 12/18/2014  . Hyperlipidemia 12/18/2014  . History of colonic polyps 08/20/2014  . Idiopathic localized osteoarthropathy 03/16/2014   Past Medical History  Diagnosis Date  . Diabetes mellitus   . Hypertension   . Bronchitis   . Colon polyp 2008  . Arthritis     knee   Current Outpatient Prescriptions on File Prior to Visit  Medication Sig  . BYDUREON 2 MG SRER Inject 2 mg into the skin once a week.  . fenofibrate (TRICOR) 145 MG tablet Take 145 mg by mouth daily.  Marland Kitchen glipiZIDE (GLUCOTROL XL) 10 MG 24 hr tablet Take 1 tablet (10 mg total) by mouth daily with breakfast.  . hydrochlorothiazide (HYDRODIURIL) 50 MG tablet Take 50 mg by mouth daily.  . metFORMIN (GLUCOPHAGE) 1000 MG tablet Take 1,000 mg by mouth 2 (two) times daily with a meal.  . ramipril (ALTACE) 10 MG capsule Take 10 mg by mouth 2 (two) times daily.   . sitaGLIPtin (JANUVIA) 100 MG tablet Take 100 mg by mouth daily.   No current facility-administered medications on file prior to visit.   Allergies  Allergen Reactions  . Atorvastatin     Other reaction(s): Headache  . Penicillins Rash   Past Surgical History  Procedure Laterality Date  . Tonsillectomy    .  Colonoscopy  2008, 2016    Dr Bary Castilla  . Lipoma excision N/A 03/26/2015       . Dermatofibroma  03/26/15    Procedure: Excision mass left leg;  Surgeon: Robert Bellow, MD;  Location: [facility] ORS;  Service: General;  Laterality: N/A;   Social History   Social History  . Marital Status: Married    Spouse Name: N/A  . Number of Children: N/A  . Years of Education: N/A   Occupational History  . Not on file.   Social History Main  Topics  . Smoking status: Former Smoker -- 2.00 packs/day for 35 years    Types: Cigarettes, E-cigarettes    Quit date: 12/11/2014  . Smokeless tobacco: Never Used     Comment: Pt still smokes e-cigarettes, occasionally a "drag"  . Alcohol Use: No  . Drug Use: No  . Sexual Activity: Not on file   Other Topics Concern  . Not on file   Social History Narrative   Family History  Problem Relation Age of Onset  . Stroke Mother   . Stroke Father   . Diabetes Other   . Other Daughter 25    precancerous colon polyp       Objective:   Physical Exam  Constitutional: He is oriented to person, place, and time. He appears well-developed and well-nourished.  Cardiovascular: Normal rate and regular rhythm.   Pulmonary/Chest: Effort normal.  Musculoskeletal: He exhibits edema and tenderness.  Tender over knee joint lateral to patella bilaterally. Mild erythema. Is wearing bilateral knee braces that were removed for exam. No erythema.   Neurological: He is alert and oriented to person, place, and time.  Skin: Skin is warm and dry. No erythema.  Psychiatric: He has a normal mood and affect. His behavior is normal. Judgment and thought content normal.  BP 152/82 mmHg  Pulse 96  Temp(Src) 98.2 F (36.8 C) (Oral)  Resp 16  Wt 253 lb (114.76 kg)     Assessment & Plan:  1. Other bilateral secondary osteoarthritis of knee Does have bilateral severe osteoarthritis of knees. Orthopedic surgeon recommended bilateral knee replacements.  Filled out form for short term disability. Is currently unable to perform  Duties of his current job.  Has difficulty getting into his truck or driving secondary to worsening knee pain.   Please see disability sheet. Recommend proceed with knee surgeries as knee pain is impacting his ability to do iADLs.  Margarita Rana, MD

## 2015-06-14 ENCOUNTER — Encounter: Payer: Self-pay | Admitting: Family Medicine

## 2015-06-14 ENCOUNTER — Ambulatory Visit (INDEPENDENT_AMBULATORY_CARE_PROVIDER_SITE_OTHER): Payer: BLUE CROSS/BLUE SHIELD | Admitting: Family Medicine

## 2015-06-14 VITALS — BP 124/72 | HR 88 | Temp 97.9°F | Resp 16 | Wt 255.0 lb

## 2015-06-14 DIAGNOSIS — E119 Type 2 diabetes mellitus without complications: Secondary | ICD-10-CM | POA: Diagnosis not present

## 2015-06-14 DIAGNOSIS — Z794 Long term (current) use of insulin: Secondary | ICD-10-CM

## 2015-06-14 DIAGNOSIS — I10 Essential (primary) hypertension: Secondary | ICD-10-CM

## 2015-06-14 DIAGNOSIS — E7800 Pure hypercholesterolemia, unspecified: Secondary | ICD-10-CM

## 2015-06-14 NOTE — Progress Notes (Signed)
Subjective:    Patient ID: [name], male    DOB: [birth year], 64 y.o.   MRN: [name]  Diabetes He presents for his follow-up diabetic visit. He has type 2 (LOV 05/28/2015 and A1C was 9.3%. Glipizide was increased to 10 mg at LOV) diabetes mellitus. Disease course: unchanged. There are no hypoglycemic associated symptoms. Pertinent negatives for diabetes include no blurred vision, no chest pain, no fatigue, no foot paresthesias, no foot ulcerations, no polydipsia, no polyphagia, no polyuria, no visual change and no weakness. Current diabetic treatment includes oral agent (triple therapy) (Januvia 100 mg, Metformin 1000 mg BID, Glipizide 10 mg, as well as Bydureon 2 mg). He is following a generally healthy (is working on diet) diet. He never (due to bilateral OA of knees) participates in exercise. There is no change in his home blood glucose trend. His breakfast blood glucose range is generally 180-200 mg/dl. An ACE inhibitor/angiotensin II receptor blocker is being taken.      Review of Systems  Constitutional: Negative for fatigue.  Eyes: Negative for blurred vision.  Cardiovascular: Negative for chest pain.  Endocrine: Negative for polydipsia, polyphagia and polyuria.  Neurological: Negative for weakness.   BP 124/72 mmHg  Pulse 88  Temp(Src) 97.9 F (36.6 C) (Oral)  Resp 16  Wt 255 lb (115.667 kg)   Patient Active Problem List   Diagnosis Date Noted  . Allergic rhinitis 05/28/2015  . Arthritis 05/28/2015  . Adult BMI 30+ 05/28/2015  . Chronic constipation 05/28/2015  . Chronic obstructive pulmonary disease ([place]) 05/28/2015  . Type 2 diabetes mellitus ([place]) 05/28/2015  . Essential (primary) hypertension 05/28/2015  . History of methicillin resistant Staphylococcus aureus infection 05/28/2015  . Hypercholesterolemia 05/28/2015  . Gastro-esophageal reflux disease without esophagitis 05/28/2015  . Subclinical hypothyroidism 05/28/2015  . Current tobacco use 05/28/2015  .  Dermatofibroma of left lower leg 04/01/2015  . Former smoker 03/04/2015  . Hypertension 02/19/2015  . Diabetes ([place]) 12/18/2014  . Hyperlipidemia 12/18/2014  . History of colonic polyps 08/20/2014  . Idiopathic localized osteoarthropathy 03/16/2014   Past Medical History  Diagnosis Date  . Diabetes mellitus   . Hypertension   . Bronchitis   . Colon polyp 2008  . Arthritis     knee   Current Outpatient Prescriptions on File Prior to Visit  Medication Sig  . BYDUREON 2 MG SRER Inject 2 mg into the skin once a week.  . fenofibrate (TRICOR) 145 MG tablet Take 145 mg by mouth daily.  Marland Kitchen glipiZIDE (GLUCOTROL XL) 10 MG 24 hr tablet Take 1 tablet (10 mg total) by mouth daily with breakfast.  . hydrochlorothiazide (HYDRODIURIL) 50 MG tablet Take 50 mg by mouth daily.  . metFORMIN (GLUCOPHAGE) 1000 MG tablet Take 1,000 mg by mouth 2 (two) times daily with a meal.  . ramipril (ALTACE) 10 MG capsule Take 10 mg by mouth 2 (two) times daily.   . sitaGLIPtin (JANUVIA) 100 MG tablet Take 100 mg by mouth daily.   No current facility-administered medications on file prior to visit.   Allergies  Allergen Reactions  . Atorvastatin     Other reaction(s): Headache  . Penicillins Rash   Past Surgical History  Procedure Laterality Date  . Tonsillectomy    . Colonoscopy  2008, 2016    Dr Bary Castilla  . Lipoma excision N/A 03/26/2015       . Dermatofibroma  03/26/15    Procedure: Excision mass left leg;  Surgeon: Robert Bellow, MD;  Location: [facility]  ORS;  Service: General;  Laterality: N/A;   Social History   Social History  . Marital Status: Married    Spouse Name: N/A  . Number of Children: N/A  . Years of Education: N/A   Occupational History  . Not on file.   Social History Main Topics  . Smoking status: Former Smoker -- 2.00 packs/day for 35 years    Types: Cigarettes, E-cigarettes    Quit date: 12/11/2014  . Smokeless tobacco: Never Used     Comment: Pt still smokes e-cigarettes,  occasionally a "drag"  . Alcohol Use: No  . Drug Use: No  . Sexual Activity: Not on file   Other Topics Concern  . Not on file   Social History Narrative   Family History  Problem Relation Age of Onset  . Stroke Mother   . Stroke Father   . Diabetes Other   . Other Daughter 25    precancerous colon polyp       Objective:   Physical Exam  Constitutional: He is oriented to person, place, and time. He appears well-developed and well-nourished.  Cardiovascular: Normal rate and regular rhythm.   Pulmonary/Chest: Effort normal and breath sounds normal.  Neurological: He is alert and oriented to person, place, and time.  Psychiatric: He has a normal mood and affect. His behavior is normal. Judgment and thought content normal.  BP 124/72 mmHg  Pulse 88  Temp(Src) 97.9 F (36.6 C) (Oral)  Resp 16  Wt 255 lb (115.667 kg)     Assessment & Plan:  1. Essential (primary) hypertension Stable. Will continue medication.    2. Hypercholesterolemia Stable. Continue current medication.    3. Type 2 diabetes mellitus without complication, with long-term current use of insulin (HCC) Worsening. Will need to start insulin. May loose insurance soon. Will put on lowest cost insulin and monitor. Patient will call after talks with pharmacist.   Margarita Rana, MD

## 2015-06-15 ENCOUNTER — Telehealth: Payer: Self-pay | Admitting: Family Medicine

## 2015-06-15 NOTE — Telephone Encounter (Signed)
Amy from [facility] says Lantus, Levemir, and Toujeo are on the same tier.  She says write it for "as directed" and Dispense 100 units.    Thanks,   -Mickel Baas

## 2015-06-15 NOTE — Telephone Encounter (Signed)
Pt has information regarding his insulin dosage and that he can get it from insurance.  He said this was regarding info that you requested.  He would like someone to call him back.  His call back is [phone]  Thanks Con Memos

## 2015-06-15 NOTE — Telephone Encounter (Signed)
Please call pharmacy and clarify. Patient is going to be self pay in January. Wanted to clarify cheapest long acting to start now.  Thanks.

## 2015-06-18 ENCOUNTER — Encounter: Payer: Self-pay | Admitting: Podiatry

## 2015-06-18 ENCOUNTER — Ambulatory Visit (INDEPENDENT_AMBULATORY_CARE_PROVIDER_SITE_OTHER): Payer: BLUE CROSS/BLUE SHIELD | Admitting: Podiatry

## 2015-06-18 VITALS — BP 159/91 | HR 86 | Resp 16

## 2015-06-18 DIAGNOSIS — L84 Corns and callosities: Secondary | ICD-10-CM

## 2015-06-18 DIAGNOSIS — B351 Tinea unguium: Secondary | ICD-10-CM | POA: Diagnosis not present

## 2015-06-18 DIAGNOSIS — M79676 Pain in unspecified toe(s): Secondary | ICD-10-CM

## 2015-06-21 ENCOUNTER — Other Ambulatory Visit: Payer: Self-pay | Admitting: Family Medicine

## 2015-06-21 DIAGNOSIS — E119 Type 2 diabetes mellitus without complications: Secondary | ICD-10-CM

## 2015-06-21 DIAGNOSIS — Z794 Long term (current) use of insulin: Principal | ICD-10-CM

## 2015-06-21 MED ORDER — INSULIN GLARGINE 100 UNIT/ML SOLOSTAR PEN: 10.0000 [IU] | PEN_INJECTOR | Freq: Every day | SUBCUTANEOUS | Status: DC

## 2015-06-21 NOTE — Progress Notes (Signed)
Subjective:     Patient ID: [name], male   DOB: October 19, 1950, 64 y.o.   MRN: [name]  HPI patient presents with thick yellow brittle nailbeds 1-5 both feet and lesion underneath the left foot that becomes tender. Patient states this is been ongoing   Review of Systems     Objective:   Physical Exam Neurovascular status intact thick yellow brittle nailbeds 1-5 both feet that are painful and lesion plantar left    Assessment:     Mycotic nail and lesion formation bilateral    Plan:     H&P conditions reviewed and debridement of nails and lesion accomplished with no iatrogenic bleeding noted

## 2015-06-28 ENCOUNTER — Encounter: Payer: Self-pay | Admitting: Family Medicine

## 2015-07-27 NOTE — Telephone Encounter (Signed)
Talked with patient.  Sugars are improving.   Did get flu shot in September, 2016.  Going to speak with lawyer about disability expert. Thanks.

## 2015-08-11 ENCOUNTER — Other Ambulatory Visit: Payer: Self-pay | Admitting: Family Medicine

## 2015-08-11 DIAGNOSIS — E119 Type 2 diabetes mellitus without complications: Secondary | ICD-10-CM

## 2015-08-11 DIAGNOSIS — I10 Essential (primary) hypertension: Secondary | ICD-10-CM

## 2015-08-11 DIAGNOSIS — Z794 Long term (current) use of insulin: Principal | ICD-10-CM

## 2015-08-20 ENCOUNTER — Telehealth: Payer: Self-pay | Admitting: Family Medicine

## 2015-08-27 ENCOUNTER — Ambulatory Visit (INDEPENDENT_AMBULATORY_CARE_PROVIDER_SITE_OTHER): Payer: BLUE CROSS/BLUE SHIELD | Admitting: Family Medicine

## 2015-08-27 ENCOUNTER — Encounter: Payer: Self-pay | Admitting: Family Medicine

## 2015-08-27 VITALS — BP 118/70 | HR 88 | Temp 97.8°F | Resp 16 | Wt 264.0 lb

## 2015-08-27 DIAGNOSIS — Z794 Long term (current) use of insulin: Secondary | ICD-10-CM | POA: Diagnosis not present

## 2015-08-27 DIAGNOSIS — E119 Type 2 diabetes mellitus without complications: Secondary | ICD-10-CM | POA: Diagnosis not present

## 2015-08-27 DIAGNOSIS — I10 Essential (primary) hypertension: Secondary | ICD-10-CM | POA: Diagnosis not present

## 2015-08-27 LAB — POCT GLYCOSYLATED HEMOGLOBIN (HGB A1C)
Est. average glucose Bld gHb Est-mCnc: 183
Hemoglobin A1C: 8

## 2015-08-27 MED ORDER — SAXAGLIPTIN HCL 5 MG PO TABS: 5.0000 mg | ORAL_TABLET | Freq: Every day | ORAL | Status: DC

## 2015-08-27 NOTE — Progress Notes (Signed)
Subjective:    Patient ID: [name], male    DOB: 1951/06/10, 65 y.o.   MRN: [name]  Diabetes He presents for his follow-up (Last A1C 05/28/2015 and was 9.3%) diabetic visit. He has type 2 diabetes mellitus. There are no hypoglycemic associated symptoms. Associated symptoms include fatigue. Pertinent negatives for diabetes include no blurred vision, no chest pain, no foot paresthesias, no foot ulcerations, no polydipsia, no polyphagia, no polyuria, no visual change, no weakness and no weight loss. Risk factors for coronary artery disease include diabetes mellitus, dyslipidemia, hypertension, male sex and obesity. Current diabetic treatment includes oral agent (dual therapy) and insulin injections (Metformin, Januvia, Lantus). He is compliant with treatment most of the time (pt reports he has not taken Januvia in the last 2 weeks due to insurance problems). He is following a generally healthy diet. He rarely participates in exercise. An ACE inhibitor/angiotensin II receptor blocker is being taken. Eye exam is not current.  Hypertension This is a chronic problem. The problem is unchanged. The problem is controlled. Associated symptoms include malaise/fatigue. Pertinent negatives include no anxiety, blurred vision, chest pain, neck pain, orthopnea, palpitations, peripheral edema or shortness of breath. Treatments tried: HCTZ 50 mg, Ramipril 10 mg. The current treatment provides moderate improvement. There are no compliance problems.       Review of Systems  Constitutional: Positive for malaise/fatigue and fatigue. Negative for weight loss.  Eyes: Negative for blurred vision.  Respiratory: Negative for shortness of breath.   Cardiovascular: Negative for chest pain, palpitations and orthopnea.  Endocrine: Negative for polydipsia, polyphagia and polyuria.  Musculoskeletal: Negative for neck pain.  Neurological: Negative for weakness.   BP 118/70 mmHg  Pulse 88  Temp(Src) 97.8 F (36.6 C)  (Oral)  Resp 16  Wt 264 lb (119.75 kg)   Patient Active Problem List   Diagnosis Date Noted  . Diabetes mellitus ([place]) 06/14/2015  . Allergic rhinitis 05/28/2015  . Arthritis 05/28/2015  . Adult BMI 30+ 05/28/2015  . Chronic constipation 05/28/2015  . Chronic obstructive pulmonary disease ([place]) 05/28/2015  . Essential (primary) hypertension 05/28/2015  . History of methicillin resistant Staphylococcus aureus infection 05/28/2015  . Hypercholesterolemia 05/28/2015  . Gastro-esophageal reflux disease without esophagitis 05/28/2015  . Subclinical hypothyroidism 05/28/2015  . Current tobacco use 05/28/2015  . Dermatofibroma of left lower leg 04/01/2015  . Former smoker 03/04/2015  . Hypertension 02/19/2015  . History of colonic polyps 08/20/2014  . Idiopathic localized osteoarthropathy 03/16/2014   Past Medical History  Diagnosis Date  . Diabetes mellitus   . Hypertension   . Bronchitis   . Colon polyp 2008  . Arthritis     knee   Current Outpatient Prescriptions on File Prior to Visit  Medication Sig  . fenofibrate (TRICOR) 145 MG tablet Take 145 mg by mouth daily.  . hydrochlorothiazide (HYDRODIURIL) 50 MG tablet TAKE 1 TABLET BY MOUTH DAILY  . Insulin Glargine (LANTUS SOLOSTAR) 100 UNIT/ML Solostar Pen Inject 10 Units into the skin daily at 10 pm. Increase by 2 units daily until fasting is 130 in am.  . metFORMIN (GLUCOPHAGE) 1000 MG tablet Take 1,000 mg by mouth 2 (two) times daily with a meal.  . ramipril (ALTACE) 10 MG capsule TAKE ONE (1) CAPSULE BY MOUTH 2 TIMES DAILY  . JANUVIA 100 MG tablet TAKE 1 TABLET BY MOUTH DAILY (Patient not taking: Reported on 08/27/2015)   No current facility-administered medications on file prior to visit.   Allergies  Allergen Reactions  . Atorvastatin  Other reaction(s): Headache  . Penicillins Rash   Past Surgical History  Procedure Laterality Date  . Tonsillectomy    . Colonoscopy  2008, 2016    Dr Bary Castilla  . Lipoma  excision N/A 03/26/2015       . Dermatofibroma  03/26/15    Procedure: Excision mass left leg;  Surgeon: Robert Bellow, MD;  Location: [facility] ORS;  Service: General;  Laterality: N/A;   Social History   Social History  . Marital Status: Married    Spouse Name: N/A  . Number of Children: N/A  . Years of Education: N/A   Occupational History  . Not on file.   Social History Main Topics  . Smoking status: Former Smoker -- 2.00 packs/day for 35 years    Types: Cigarettes, E-cigarettes    Quit date: 12/11/2014  . Smokeless tobacco: Never Used     Comment: Pt still smokes e-cigarettes, occasionally a "drag"  . Alcohol Use: No  . Drug Use: No  . Sexual Activity: Not on file   Other Topics Concern  . Not on file   Social History Narrative   Family History  Problem Relation Age of Onset  . Stroke Mother   . Stroke Father   . Diabetes Other   . Other Daughter 25    precancerous colon polyp       Objective:   Physical Exam  Constitutional: He is oriented to person, place, and time. He appears well-developed and well-nourished.  Neurological: He is alert and oriented to person, place, and time.  Psychiatric: He has a normal mood and affect. His behavior is normal. Judgment and thought content normal.    BP 118/70 mmHg  Pulse 88  Temp(Src) 97.8 F (36.6 C) (Oral)  Resp 16  Wt 264 lb (119.75 kg)     Assessment & Plan:  1. Type 2 diabetes mellitus without complication, with long-term current use of insulin (HCC) Improved, but not at goal.   Will add Onglyza.  Recheck in 3 months.  - POCT glycosylated hemoglobin (Hb A1C) - saxagliptin HCl (ONGLYZA) 5 MG TABS tablet; Take 1 tablet (5 mg total) by mouth daily.  Dispense: 30 tablet; Refill: 30 Results for orders placed or performed in visit on 08/27/15  POCT glycosylated hemoglobin (Hb A1C)  Result Value Ref Range   Hemoglobin A1C 8.0    Est. average glucose Bld gHb Est-mCnc 183     2. Essential (primary)  hypertension Condition is stable. Please continue current medication and  plan of care as noted.   Patient was seen and examined by Jerrell Belfast, MD, and note scribed by Renaldo Fiddler, CMA. I have reviewed the document for accuracy and completeness and I agree with above. Jerrell Belfast, MD   Margarita Rana, MD

## 2015-09-16 ENCOUNTER — Other Ambulatory Visit: Payer: Self-pay | Admitting: Family Medicine

## 2015-09-16 DIAGNOSIS — E119 Type 2 diabetes mellitus without complications: Secondary | ICD-10-CM

## 2015-09-16 DIAGNOSIS — Z794 Long term (current) use of insulin: Principal | ICD-10-CM

## 2015-09-21 ENCOUNTER — Ambulatory Visit: Payer: BLUE CROSS/BLUE SHIELD | Admitting: Sports Medicine

## 2015-09-21 ENCOUNTER — Other Ambulatory Visit (INDEPENDENT_AMBULATORY_CARE_PROVIDER_SITE_OTHER): Payer: BLUE CROSS/BLUE SHIELD

## 2015-09-23 ENCOUNTER — Ambulatory Visit (INDEPENDENT_AMBULATORY_CARE_PROVIDER_SITE_OTHER): Payer: BLUE CROSS/BLUE SHIELD | Admitting: Podiatry

## 2015-09-23 ENCOUNTER — Encounter: Payer: Self-pay | Admitting: Podiatry

## 2015-09-23 DIAGNOSIS — M79676 Pain in unspecified toe(s): Secondary | ICD-10-CM | POA: Diagnosis not present

## 2015-09-23 DIAGNOSIS — L84 Corns and callosities: Secondary | ICD-10-CM

## 2015-09-23 DIAGNOSIS — B351 Tinea unguium: Secondary | ICD-10-CM

## 2015-09-23 NOTE — Progress Notes (Signed)
Patient ID: [name], male   DOB: Nov 21, 1950, 65 y.o.   MRN: [name]  Subjective: 65 y.o. returns the office today for painful, elongated, thickened toenails which he cannot trim himself. Denies any redness or drainage around the nails. Also has painful calluses to his feet. No swelling or redness.  Denies any acute changes since last appointment and no new complaints today. Denies any systemic complaints such as fevers, chills, nausea, vomiting.   Objective: AAO 3, NAD DP/PT pulses palpable, CRT less than 3 seconds Nails hypertrophic, dystrophic, elongated, brittle, discolored 10. There is tenderness overlying the nails 1-5 bilaterally. There is no surrounding erythema or drainage along the nail sites. Hyperkeratotic lesions bilateral submetatarsal 3. Upon debridement no underlying ulceration, drainage or other signs of infection. No open lesions or pre-ulcerative lesions are identified. No other areas of tenderness bilateral lower extremities. No overlying edema, erythema, increased warmth. No pain with calf compression, swelling, warmth, erythema.  Assessment: Patient presents with symptomatic onychomycosis, hyperkerotic lesions  Plan: -Treatment options including alternatives, risks, complications were discussed -Nails sharply debrided 10 without complication/bleeding.  -Hyperkeratotic lesions 3 with debrided without competitions or bleeding. -Discussed daily foot inspection. If there are any changes, to call the office immediately.  -Follow-up in 3 months or sooner if any problems are to arise. In the meantime, encouraged to call the office with any questions, concerns, changes symptoms.  Celesta Gentile, DPM

## 2015-10-15 ENCOUNTER — Ambulatory Visit: Payer: BLUE CROSS/BLUE SHIELD | Admitting: Sports Medicine

## 2015-10-28 ENCOUNTER — Encounter: Payer: Self-pay | Admitting: Family Medicine

## 2015-11-08 ENCOUNTER — Other Ambulatory Visit: Payer: Self-pay | Admitting: Family Medicine

## 2015-11-09 ENCOUNTER — Encounter: Payer: Self-pay | Admitting: Family Medicine

## 2015-11-09 ENCOUNTER — Ambulatory Visit (INDEPENDENT_AMBULATORY_CARE_PROVIDER_SITE_OTHER): Payer: BLUE CROSS/BLUE SHIELD | Admitting: Family Medicine

## 2015-11-09 VITALS — BP 136/72 | HR 80 | Temp 98.7°F | Resp 16 | Wt 273.0 lb

## 2015-11-09 DIAGNOSIS — M170 Bilateral primary osteoarthritis of knee: Secondary | ICD-10-CM | POA: Diagnosis not present

## 2015-11-09 DIAGNOSIS — M1990 Unspecified osteoarthritis, unspecified site: Secondary | ICD-10-CM | POA: Diagnosis not present

## 2015-11-09 NOTE — Progress Notes (Signed)
Patient ID: [name], male   DOB: 03-09-1951, 65 y.o.   MRN: [name]         Patient: [name] Male    DOB: 1951/02/16   65 y.o.   MRN: [name] Visit Date: 11/09/2015  Today's Provider: Margarita Rana, MD   Chief Complaint  Patient presents with  . Knee Pain   Subjective:    Knee Pain  The pain is present in the right knee and left knee. The pain is severe. The pain has been constant since onset. Associated symptoms include an inability to bear weight and a loss of motion. He reports no foreign bodies present.  Pt is here to fill out his long term disability.  He reports no changes to his knees, he is unable to stand, or walk for long period of times.  He not able to return to work.       Allergies  Allergen Reactions  . Atorvastatin     Other reaction(s): Headache  . Penicillins Rash   Previous Medications   FENOFIBRATE (TRICOR) 145 MG TABLET    TAKE 1 TABLET BY MOUTH DAILY   HYDROCHLOROTHIAZIDE (HYDRODIURIL) 50 MG TABLET    TAKE 1 TABLET BY MOUTH DAILY   INSULIN GLARGINE (LANTUS SOLOSTAR) 100 UNIT/ML SOLOSTAR PEN    Inject 10 Units into the skin daily at 10 pm. Increase by 2 units daily until fasting is 130 in am.   JANUVIA 100 MG TABLET    TAKE 1 TABLET BY MOUTH DAILY   METFORMIN (GLUCOPHAGE) 1000 MG TABLET    TAKE 1 TABLET BY MOUTH TWICE A DAY   RAMIPRIL (ALTACE) 10 MG CAPSULE    TAKE ONE (1) CAPSULE BY MOUTH 2 TIMES DAILY   SAXAGLIPTIN HCL (ONGLYZA) 5 MG TABS TABLET    Take 1 tablet (5 mg total) by mouth daily.    Review of Systems  Constitutional: Negative.   Cardiovascular: Negative.   Musculoskeletal: Positive for joint swelling and arthralgias. Negative for myalgias, back pain, gait problem, neck pain and neck stiffness.  Neurological: Negative for dizziness, light-headedness and headaches.    Social History  Substance Use Topics  . Smoking status: Former Smoker -- 2.00 packs/day for 35 years    Types: Cigarettes, E-cigarettes    Quit date:  12/11/2014  . Smokeless tobacco: Never Used     Comment: Pt still smokes e-cigarettes, occasionally a "drag"  . Alcohol Use: No   Objective:   BP 136/72 mmHg  Pulse 80  Temp(Src) 98.7 F (37.1 C) (Oral)  Resp 16  Wt 273 lb (123.832 kg)  Physical Exam  Constitutional: He is oriented to person, place, and time. He appears well-developed and well-nourished.  Musculoskeletal:  Remarkable for antalgic gait.    Neurological: He is alert and oriented to person, place, and time.  Skin: Skin is warm and dry.  Psychiatric: He has a normal mood and affect. His behavior is normal. Judgment and thought content normal.      Assessment & Plan:     1. Arthritis Unchanged.  Current follow up with orthopedic surgeon.   2. Primary osteoarthritis of both knees Still with significant arthritis of both knees that severely limits his mobility.  Reviewed previous ov and records from specialist. Does need bilateral knee replacements but not great candidate secondary to his other health issues. Filled out paper work for disability. Please see paper work for limitations of mobility.        Patient was seen and examined  by Jerrell Belfast, MD, and note scribed by Ashley Royalty, CMA.   Margarita Rana, MD  [facility]

## 2015-11-26 ENCOUNTER — Ambulatory Visit (INDEPENDENT_AMBULATORY_CARE_PROVIDER_SITE_OTHER): Payer: BLUE CROSS/BLUE SHIELD | Admitting: Family Medicine

## 2015-11-26 ENCOUNTER — Encounter: Payer: Self-pay | Admitting: Family Medicine

## 2015-11-26 VITALS — BP 156/82 | HR 96 | Temp 98.9°F | Resp 16 | Wt 278.0 lb

## 2015-11-26 DIAGNOSIS — Z794 Long term (current) use of insulin: Secondary | ICD-10-CM

## 2015-11-26 DIAGNOSIS — E119 Type 2 diabetes mellitus without complications: Secondary | ICD-10-CM | POA: Diagnosis not present

## 2015-11-26 DIAGNOSIS — J069 Acute upper respiratory infection, unspecified: Secondary | ICD-10-CM | POA: Diagnosis not present

## 2015-11-26 DIAGNOSIS — E7800 Pure hypercholesterolemia, unspecified: Secondary | ICD-10-CM

## 2015-11-26 DIAGNOSIS — J0100 Acute maxillary sinusitis, unspecified: Secondary | ICD-10-CM

## 2015-11-26 DIAGNOSIS — I10 Essential (primary) hypertension: Secondary | ICD-10-CM

## 2015-11-26 LAB — POCT GLYCOSYLATED HEMOGLOBIN (HGB A1C): Hemoglobin A1C: 8.7

## 2015-11-26 MED ORDER — AMOXICILLIN-POT CLAVULANATE 875-125 MG PO TABS: 1.0000 | ORAL_TABLET | Freq: Two times a day (BID) | ORAL | Status: DC

## 2015-11-26 MED ORDER — PRAVASTATIN SODIUM 20 MG PO TABS: 20.0000 mg | ORAL_TABLET | Freq: Every day | ORAL | Status: DC

## 2015-11-26 NOTE — Progress Notes (Signed)
Patient ID: [name], male   DOB: 06-01-1951, 65 y.o.   MRN: [id number]         Patient: [name] Male    DOB: [birth year]   65 y.o.   MRN: [id number] Visit Date: 11/26/2015  Today's Provider: Margarita Rana, MD   Chief Complaint  Patient presents with  . Hypertension  . Hyperlipidemia  . Diabetes  . Sinusitis  . URI   Subjective:    Sinusitis The current episode started yesterday. The problem is unchanged. There has been no fever. Associated symptoms include congestion, coughing, sinus pressure and a sore throat. Pertinent negatives include no chills, ear pain or sneezing. Past treatments include acetaminophen. The treatment provided moderate relief.  URI  The current episode started yesterday. The problem has been unchanged. There has been no fever. Associated symptoms include congestion, coughing, rhinorrhea, a sore throat and wheezing. Pertinent negatives include no ear pain or sneezing. He has tried acetaminophen for the symptoms. The treatment provided moderate relief.      Diabetes Mellitus Type II, Follow-up:   Lab Results  Component Value Date   HGBA1C 8.0 08/27/2015   HGBA1C 9.3 05/28/2015   HGBA1C 8.3 02/19/2015   Last seen for diabetes 3 months ago.  Management since then includes added Onglyza. He reports excellent compliance with treatment. He is not having side effects.  Current symptoms include none and have been stable. Home blood sugar records: 120-130's.  Pt reports his blood sugar has been running in the 180's the last few weeks.   Episodes of hypoglycemia? no   Most Recent Eye Exam: 1 year ago. Weight trend: increasing steadily Current diet: in general, a "healthy" diet   Current exercise: none  ------------------------------------------------------------------------   Hypertension, follow-up:  BP Readings from Last 3 Encounters:  11/26/15 156/82  11/09/15 136/72  08/27/15 118/70    He was last seen for hypertension 3 months ago.    Management since that visit includes None .He reports excellent compliance with treatment. He is not having side effects.  He is not exercising. He is adherent to low salt diet.   He is experiencing none.  Patient denies chest pain, fatigue, irregular heart beat and palpitations.   Cardiovascular risk factors include advanced age (older than 40 for men, 25 for women), diabetes mellitus, dyslipidemia and hypertension.  ------------------------------------------------------------------------    Lipid/Cholesterol, Follow-up:   Last seen for this 6 months ago.  Management since that visit includes None.  Last Lipid Panel:    Component Value Date/Time   CHOL 151 02/19/2015 0923   TRIG 135 02/19/2015 0923   HDL 42 02/19/2015 0923   CHOLHDL 3.6 02/19/2015 0923   LDLCALC 82 02/19/2015 0923    He reports excellent compliance with treatment. He is not having side effects.   Wt Readings from Last 3 Encounters:  11/26/15 278 lb (126.1 kg)  11/09/15 273 lb (123.832 kg)  08/27/15 264 lb (119.75 kg)    ------------------------------------------------------------------------        Allergies  Allergen Reactions  . Atorvastatin     Other reaction(s): Headache  . Penicillins Rash   Previous Medications   FENOFIBRATE (TRICOR) 145 MG TABLET    TAKE 1 TABLET BY MOUTH DAILY   HYDROCHLOROTHIAZIDE (HYDRODIURIL) 50 MG TABLET    TAKE 1 TABLET BY MOUTH DAILY   INSULIN GLARGINE (LANTUS SOLOSTAR) 100 UNIT/ML SOLOSTAR PEN    Inject 10 Units into the skin daily at 10 pm. Increase by 2 units daily until fasting is  130 in am.   METFORMIN (GLUCOPHAGE) 1000 MG TABLET    TAKE 1 TABLET BY MOUTH TWICE A DAY   RAMIPRIL (ALTACE) 10 MG CAPSULE    TAKE ONE (1) CAPSULE BY MOUTH 2 TIMES DAILY   SAXAGLIPTIN HCL (ONGLYZA) 5 MG TABS TABLET    Take 1 tablet (5 mg total) by mouth daily.    Review of Systems  Constitutional: Negative for fever, chills, activity change, appetite change and unexpected weight  change.  HENT: Positive for congestion, postnasal drip, rhinorrhea, sinus pressure and sore throat. Negative for ear discharge, ear pain, hearing loss, nosebleeds, sneezing, tinnitus, trouble swallowing and voice change.   Eyes: Negative for photophobia, pain, discharge, redness, itching and visual disturbance.  Respiratory: Positive for cough, chest tightness and wheezing. Negative for apnea, choking and stridor.   Cardiovascular: Negative.   Gastrointestinal: Negative.   Musculoskeletal: Positive for arthralgias. Negative for back pain, joint swelling, gait problem and neck stiffness.  Neurological: Negative for light-headedness and numbness.    Social History  Substance Use Topics  . Smoking status: Former Smoker -- 2.00 packs/day for 35 years    Types: Cigarettes, E-cigarettes    Quit date: 12/11/2014  . Smokeless tobacco: Never Used     Comment: Pt still smokes e-cigarettes, occasionally a "drag"  . Alcohol Use: No   Objective:   BP 156/82 mmHg  Pulse 96  Temp(Src) 98.9 F (37.2 C) (Oral)  Resp 16  Wt 278 lb (126.1 kg)  SpO2 95%  Results for orders placed or performed in visit on 11/26/15  POCT glycosylated hemoglobin (Hb A1C)  Result Value Ref Range   Hemoglobin A1C 8.7     Physical Exam  Constitutional: He is oriented to person, place, and time. He appears well-developed and well-nourished.  HENT:  Head: Normocephalic and atraumatic.  Right Ear: Tympanic membrane, external ear and ear canal normal.  Left Ear: Tympanic membrane, external ear and ear canal normal.  Nose: Mucosal edema present.  Mouth/Throat: Uvula is midline, oropharynx is clear and moist and mucous membranes are normal.  Cardiovascular: Normal rate, regular rhythm and normal heart sounds.   Pulmonary/Chest: Effort normal and breath sounds normal.  Neurological: He is alert and oriented to person, place, and time.  Skin: Skin is warm and dry.  Psychiatric: He has a normal mood and affect. His  behavior is normal. Judgment and thought content normal.        Assessment & Plan:     1. Essential (primary) hypertension Up today. May be related to being sick. Will monitor and adjust medication if does not come down.   2. Type 2 diabetes mellitus without complication, with long-term current use of insulin (HCC) A1C elevated at 8.7% will refer to Endocrinology to evaluate and treat.  - POCT glycosylated hemoglobin (Hb A1C) - Ambulatory referral to Endocrinology  3. Hypercholesterolemia Will try a statin again, in light of worsening diabetes.   - pravastatin (PRAVACHOL) 20 MG tablet; Take 1 tablet (20 mg total) by mouth daily.  Dispense: 90 tablet; Refill: 1  4. Acute maxillary sinusitis, recurrence not specified Worsening; will treat with Augmentin.  Advised pt to call if worsening or not improved, will add prednisone at that time.  - amoxicillin-clavulanate (AUGMENTIN) 875-125 MG tablet; Take 1 tablet by mouth 2 (two) times daily.  Dispense: 20 tablet; Refill: 0  5. Upper respiratory infection As above.    Patient was seen and examined by Jerrell Belfast, MD, and note scribed by Ashley Royalty,  CMA.   I have reviewed the document for accuracy and completeness and I agree with above. - Jerrell Belfast, MD      Margarita Rana, MD  [facility]

## 2015-12-05 ENCOUNTER — Encounter: Payer: Self-pay | Admitting: Family Medicine

## 2015-12-06 NOTE — Telephone Encounter (Signed)
Sarah- Can you check on this. Thanks.  

## 2015-12-14 ENCOUNTER — Encounter: Payer: Self-pay | Admitting: Family Medicine

## 2015-12-30 ENCOUNTER — Ambulatory Visit (INDEPENDENT_AMBULATORY_CARE_PROVIDER_SITE_OTHER): Payer: BLUE CROSS/BLUE SHIELD | Admitting: Podiatry

## 2015-12-30 ENCOUNTER — Encounter: Payer: Self-pay | Admitting: Podiatry

## 2015-12-30 DIAGNOSIS — M79676 Pain in unspecified toe(s): Secondary | ICD-10-CM

## 2015-12-30 DIAGNOSIS — B351 Tinea unguium: Secondary | ICD-10-CM

## 2015-12-30 DIAGNOSIS — L84 Corns and callosities: Secondary | ICD-10-CM | POA: Diagnosis not present

## 2015-12-30 DIAGNOSIS — E1149 Type 2 diabetes mellitus with other diabetic neurological complication: Secondary | ICD-10-CM | POA: Diagnosis not present

## 2015-12-30 NOTE — Progress Notes (Signed)
Patient ID: [name], male   DOB: March 18, 1951, 65 y.o.   MRN: [id number]   Subjective: 65 y.o. returns the office today for painful, elongated, thickened toenails which he cannot trim himself. Denies any redness or drainage around the nails. Also has painful calluses to his feet. No swelling or redness.  Denies any acute changes since last appointment and no new complaints today. Denies any systemic complaints such as fevers, chills, nausea, vomiting.   Objective: AAO 3, NAD DP/PT pulses palpable, CRT less than 3 seconds Decreased sensation with SWMF.  Nails hypertrophic, dystrophic, elongated, brittle, discolored 10. There is tenderness overlying the nails 1-5 bilaterally. There is no surrounding erythema or drainage along the nail sites. Hyperkeratotic lesions bilateral submetatarsal 2 along the right submetatarsal. Upon debridement no underlying ulceration, drainage or other signs of infection. On the right leg is a flesh colored nodule which has been ongoing. He has seen his dermatologist for this.  No open lesions or pre-ulcerative lesions are identified. No other areas of tenderness bilateral lower extremities. No overlying edema, erythema, increased warmth. No pain with calf compression, swelling, warmth, erythema.  Assessment: Patient presents with symptomatic onychomycosis, hyperkerotic lesions  Plan: -Treatment options including alternatives, risks, complications were discussed -Nails sharply debrided 10 without complication/bleeding.  -Hyperkeratotic lesions 2 with debrided without competitions or bleeding. -Discussed daily foot inspection. If there are any changes, to call the office immediately.  -Follow-up in 3 months or sooner if any problems are to arise. In the meantime, encouraged to call the office with any questions, concerns, changes symptoms.  Celesta Gentile, DPM

## 2016-01-05 LAB — HM DIABETES EYE EXAM

## 2016-02-10 ENCOUNTER — Other Ambulatory Visit: Payer: Self-pay | Admitting: Family Medicine

## 2016-02-10 DIAGNOSIS — I10 Essential (primary) hypertension: Secondary | ICD-10-CM

## 2016-02-23 NOTE — Telephone Encounter (Signed)
error 

## 2016-03-11 ENCOUNTER — Other Ambulatory Visit: Payer: Self-pay | Admitting: Family Medicine

## 2016-03-11 DIAGNOSIS — E119 Type 2 diabetes mellitus without complications: Secondary | ICD-10-CM

## 2016-03-11 DIAGNOSIS — Z794 Long term (current) use of insulin: Principal | ICD-10-CM

## 2016-04-06 ENCOUNTER — Ambulatory Visit (INDEPENDENT_AMBULATORY_CARE_PROVIDER_SITE_OTHER): Payer: BLUE CROSS/BLUE SHIELD | Admitting: Podiatry

## 2016-04-06 ENCOUNTER — Encounter: Payer: Self-pay | Admitting: Podiatry

## 2016-04-06 DIAGNOSIS — B351 Tinea unguium: Secondary | ICD-10-CM

## 2016-04-06 DIAGNOSIS — M79676 Pain in unspecified toe(s): Secondary | ICD-10-CM | POA: Diagnosis not present

## 2016-04-06 DIAGNOSIS — E1149 Type 2 diabetes mellitus with other diabetic neurological complication: Secondary | ICD-10-CM | POA: Diagnosis not present

## 2016-04-06 DIAGNOSIS — Q828 Other specified congenital malformations of skin: Secondary | ICD-10-CM | POA: Diagnosis not present

## 2016-04-06 NOTE — Progress Notes (Signed)
Patient ID: [name], male   DOB: 1951/01/23, 65 y.o.   MRN: [name]   Subjective: 65 y.o. returns the office today for painful, elongated, thickened toenails which he cannot trim himself. Denies any redness or drainage around the nails. Also has painful calluses to his feet. No swelling or redness.  Denies any acute changes since last appointment and no new complaints today. Denies any systemic complaints such as fevers, chills, nausea, vomiting.   Objective: AAO 3, NAD DP/PT pulses palpable, CRT less than 3 seconds Decreased sensation with SWMF.  Nails hypertrophic, dystrophic, elongated, brittle, discolored 10. There is tenderness overlying the nails 1-5 bilaterally. There is no surrounding erythema or drainage along the nail sites. Hyperkeratotic lesions bilateral submetatarsal 2 and right submetatarsal 5. Upon debridement no underlying ulceration, drainage or other signs of infection. On the right leg is a flesh colored nodule which has been ongoing. He has seen his dermatologist for this.  No open lesions or pre-ulcerative lesions are identified. No other areas of tenderness bilateral lower extremities. No overlying edema, erythema, increased warmth. No pain with calf compression, swelling, warmth, erythema.  Assessment: Patient presents with symptomatic onychomycosis, hyperkerotic lesions  Plan: -Treatment options including alternatives, risks, complications were discussed -Nails sharply debrided 10 without complication/bleeding.  -Hyperkeratotic lesions 3 with debrided without competitions or bleeding. -Discussed daily foot inspection. If there are any changes, to call the office immediately.  -Follow-up in 3 months or sooner if any problems are to arise. In the meantime, encouraged to call the office with any questions, concerns, changes symptoms.  Celesta Gentile, DPM

## 2016-05-09 ENCOUNTER — Ambulatory Visit (INDEPENDENT_AMBULATORY_CARE_PROVIDER_SITE_OTHER): Payer: BLUE CROSS/BLUE SHIELD | Admitting: Primary Care

## 2016-05-09 ENCOUNTER — Encounter: Payer: Self-pay | Admitting: Primary Care

## 2016-05-09 ENCOUNTER — Other Ambulatory Visit: Payer: Self-pay | Admitting: Family Medicine

## 2016-05-09 DIAGNOSIS — K219 Gastro-esophageal reflux disease without esophagitis: Secondary | ICD-10-CM

## 2016-05-09 DIAGNOSIS — I10 Essential (primary) hypertension: Secondary | ICD-10-CM | POA: Diagnosis not present

## 2016-05-09 DIAGNOSIS — Z86010 Personal history of colonic polyps: Secondary | ICD-10-CM

## 2016-05-09 DIAGNOSIS — M170 Bilateral primary osteoarthritis of knee: Secondary | ICD-10-CM

## 2016-05-09 DIAGNOSIS — Z87891 Personal history of nicotine dependence: Secondary | ICD-10-CM | POA: Diagnosis not present

## 2016-05-09 DIAGNOSIS — E7800 Pure hypercholesterolemia, unspecified: Secondary | ICD-10-CM

## 2016-05-09 NOTE — Assessment & Plan Note (Signed)
Stable on Zantac, continue same. Discussed trigger foods.

## 2016-05-09 NOTE — Progress Notes (Signed)
Subjective:    Patient ID: [name], male    DOB: 04-24-1951, 65 y.o.   MRN: [name]  HPI  [name] is a 65 year old male who presents today to establish care and discuss the problems mentioned below. Will obtain old records. His last physical was about 1 year ago.   1) Essential Hypertension: Currently managed on HCTZ 50 mg and Ramipril 10 mg from prior PCP. His BP is 160/78 in the office today. He does not check his BP at home. He denies chest pain, dizziness, acute visual changes.   2) Type 2 Diabetes: Diagnosed in 1996. Currently managed on Metformin 1000 mg BID, Victoza 1.8 mg daily, and Lantus 60-75 units at bedtime. A1C in May 2017 was 8.7. Urine microalbumin in August 2016 stable. He is managed on an ACE. He checks his sugars twice daily, once in the morning and also before bed. His Am sugars are running 90's-mid 100's and his evening sugars are running mid 100's-mid 200's. He is following with endocrinology and is due for recheck soon.   3) Hyperlipidemia: Currently managed on fenofibrate 145 mg by prior PCP. Lipid panel in August 2016 stable.   4) COPD: Prior smoker of cigarettes for 40+ years. He quit smoking June 2016. He has an albuterol inhaler that he uses very infrequently. He denies exertional shortness of breath, cough, wheezing.  5) Osteoarthritis: Located primarily to bilateral knees. He is currently on disability due to this. He cannot exercise much given his discomfort. He will occasionally take tylenol for pain with improvement.   Review of Systems  Eyes: Negative for visual disturbance.  Respiratory: Negative for cough and shortness of breath.   Cardiovascular: Negative for chest pain.  Musculoskeletal: Positive for arthralgias.  Neurological: Negative for dizziness, numbness and headaches.       Past Medical History:  Diagnosis Date  . Arthritis    knee  . Bronchitis   . Colon polyp 2008  . Diabetes mellitus   . Hypertension      Social History     Social History  . Marital status: Married    Spouse name: N/A  . Number of children: N/A  . Years of education: N/A   Occupational History  . Not on file.   Social History Main Topics  . Smoking status: Former Smoker    Packs/day: 2.00    Years: 35.00    Types: Cigarettes, E-cigarettes    Quit date: 12/11/2014  . Smokeless tobacco: Never Used     Comment: Pt still smokes e-cigarettes, occasionally a "drag"  . Alcohol use No  . Drug use: No  . Sexual activity: Not on file   Other Topics Concern  . Not on file   Social History Narrative   Married.   3 children. 2 grandchildren.   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     Past Surgical History:  Procedure Laterality Date  . COLONOSCOPY  2008, 2016   Dr Bary Castilla  . DERMATOFIBROMA  03/26/15   Procedure: Excision mass left leg;  Surgeon: Robert Bellow, MD;  Location: [facility] ORS;  Service: General;  Laterality: N/A;  . LIPOMA EXCISION N/A 03/26/2015      . TONSILLECTOMY      Family History  Problem Relation Age of Onset  . Stroke Mother   . Diabetes Mother   . Stroke Father   . Diabetes Other   . Other  Daughter 25    precancerous colon polyp   . Diabetes Maternal Grandmother   . Diabetes Paternal Grandmother     Allergies  Allergen Reactions  . Atorvastatin     Other reaction(s): Headache  . Other Other (See Comments)    STATIN DRUG-CAUSED MUSCLE PAIN/LETHARGIC    Current Outpatient Prescriptions on File Prior to Visit  Medication Sig Dispense Refill  . hydrochlorothiazide (HYDRODIURIL) 50 MG tablet TAKE 1 TABLET BY MOUTH DAILY 90 tablet 1  . Insulin Glargine (LANTUS SOLOSTAR) 100 UNIT/ML Solostar Pen Inject 10 Units into the skin daily at 10 pm. Increase by 2 units daily until fasting is 130 in am. (Patient taking differently: Inject 55-75 units into the skin once a day) 5 pen PRN  . metFORMIN (GLUCOPHAGE) 1000 MG tablet TAKE 1 TABLET BY MOUTH TWICE A DAY  180 tablet 1  . ramipril (ALTACE) 10 MG capsule TAKE ONE (1) CAPSULE BY MOUTH 2 TIMES DAILY 180 capsule 1   No current facility-administered medications on file prior to visit.     BP (!) 160/78   Pulse 86   Temp 97.6 F (36.4 C) (Oral)   Ht 5\' 10"  (1.778 m)   Wt 275 lb 1.9 oz (124.8 kg)   SpO2 98%   BMI 39.48 kg/m    Objective:   Physical Exam  Constitutional: He is oriented to person, place, and time. He appears well-nourished.  Neck: Neck supple.  Cardiovascular: Normal rate and regular rhythm.   Pulmonary/Chest: Effort normal and breath sounds normal. He has no wheezes. He has no rales.  Neurological: He is alert and oriented to person, place, and time.  Skin: Skin is warm and dry.  Psychiatric: He has a normal mood and affect.          Assessment & Plan:

## 2016-05-09 NOTE — Assessment & Plan Note (Signed)
Lipids in 2016 stable, due for recheck soon. Will complete this at upcoming physical.

## 2016-05-09 NOTE — Assessment & Plan Note (Signed)
Quit smoking in June 2016. Prior smoker of 40+ years. Will refer him for low dose CT scan during upcoming physical.

## 2016-05-09 NOTE — Assessment & Plan Note (Signed)
Follows with endocrinology, due for re-evaluation soon. Continue current regimen.

## 2016-05-09 NOTE — Assessment & Plan Note (Signed)
Completed colonoscopy in 2016, due in 5 years. No alarm signs.

## 2016-05-09 NOTE — Patient Instructions (Addendum)
Your blood pressure is too high. I'd like for you to monitor your blood pressure at home 3-4 times weekly. Your goal blood pressure is less than 140/90, so if you see this number or anything higher on a consistent basis, then please notify me.  Follow up with your endocrinologist as scheduled.  Please schedule a physical with me before the end of the year. You may also schedule a lab only appointment 3-4 days prior. We will discuss your lab results in detail during your physical.  It was a pleasure to meet you today! Please don't hesitate to call me with any questions. Welcome to [facility]!  DASH Eating Plan DASH stands for "Dietary Approaches to Stop Hypertension." The DASH eating plan is a healthy eating plan that has been shown to reduce high blood pressure (hypertension). Additional health benefits may include reducing the risk of type 2 diabetes mellitus, heart disease, and stroke. The DASH eating plan may also help with weight loss. WHAT DO I NEED TO KNOW ABOUT THE DASH EATING PLAN? For the DASH eating plan, you will follow these general guidelines:  Choose foods with a percent daily value for sodium of less than 5% (as listed on the food label).  Use salt-free seasonings or herbs instead of table salt or sea salt.  Check with your health care provider or pharmacist before using salt substitutes.  Eat lower-sodium products, often labeled as "lower sodium" or "no salt added."  Eat fresh foods.  Eat more vegetables, fruits, and low-fat dairy products.  Choose whole grains. Look for the word "whole" as the first word in the ingredient list.  Choose fish and skinless chicken or [place] more often than red meat. Limit fish, poultry, and meat to 6 oz (170 g) each day.  Limit sweets, desserts, sugars, and sugary drinks.  Choose heart-healthy fats.  Limit cheese to 1 oz (28 g) per day.  Eat more home-cooked food and less restaurant, buffet, and fast food.  Limit fried foods.  Cook  foods using methods other than frying.  Limit canned vegetables. If you do use them, rinse them well to decrease the sodium.  When eating at a restaurant, ask that your food be prepared with less salt, or no salt if possible. WHAT FOODS CAN I EAT? Seek help from a dietitian for individual calorie needs. Grains Whole grain or whole wheat bread. Brown rice. Whole grain or whole wheat pasta. Quinoa, bulgur, and whole grain cereals. Low-sodium cereals. Corn or whole wheat flour tortillas. Whole grain cornbread. Whole grain crackers. Low-sodium crackers. Vegetables Fresh or frozen vegetables (raw, steamed, roasted, or grilled). Low-sodium or reduced-sodium tomato and vegetable juices. Low-sodium or reduced-sodium tomato sauce and paste. Low-sodium or reduced-sodium canned vegetables.  Fruits All fresh, canned (in natural juice), or frozen fruits. Meat and Other Protein Products Ground beef (85% or leaner), grass-fed beef, or beef trimmed of fat. Skinless chicken or [place]. Ground chicken or [place]. Pork trimmed of fat. All fish and seafood. Eggs. Dried beans, peas, or lentils. Unsalted nuts and seeds. Unsalted canned beans. Dairy Low-fat dairy products, such as skim or 1% milk, 2% or reduced-fat cheeses, low-fat ricotta or cottage cheese, or plain low-fat yogurt. Low-sodium or reduced-sodium cheeses. Fats and Oils Tub margarines without trans fats. Light or reduced-fat mayonnaise and salad dressings (reduced sodium). Avocado. Safflower, olive, or canola oils. Natural peanut or almond butter. Other Unsalted popcorn and pretzels. The items listed above may not be a complete list of recommended foods or beverages. Contact your  dietitian for more options. WHAT FOODS ARE NOT RECOMMENDED? Grains White bread. White pasta. White rice. Refined cornbread. Bagels and croissants. Crackers that contain trans fat. Vegetables Creamed or fried vegetables. Vegetables in a cheese sauce. Regular canned vegetables.  Regular canned tomato sauce and paste. Regular tomato and vegetable juices. Fruits Dried fruits. Canned fruit in light or heavy syrup. Fruit juice. Meat and Other Protein Products Fatty cuts of meat. Ribs, chicken wings, bacon, sausage, bologna, salami, chitterlings, fatback, hot dogs, bratwurst, and packaged luncheon meats. Salted nuts and seeds. Canned beans with salt. Dairy Whole or 2% milk, cream, half-and-half, and cream cheese. Whole-fat or sweetened yogurt. Full-fat cheeses or blue cheese. Nondairy creamers and whipped toppings. Processed cheese, cheese spreads, or cheese curds. Condiments Onion and garlic salt, seasoned salt, table salt, and sea salt. Canned and packaged gravies. Worcestershire sauce. Tartar sauce. Barbecue sauce. Teriyaki sauce. Soy sauce, including reduced sodium. Steak sauce. Fish sauce. Oyster sauce. Cocktail sauce. Horseradish. Ketchup and mustard. Meat flavorings and tenderizers. Bouillon cubes. Hot sauce. Tabasco sauce. Marinades. Taco seasonings. Relishes. Fats and Oils Butter, stick margarine, lard, shortening, ghee, and bacon fat. Coconut, palm kernel, or palm oils. Regular salad dressings. Other Pickles and olives. Salted popcorn and pretzels. The items listed above may not be a complete list of foods and beverages to avoid. Contact your dietitian for more information. WHERE CAN I FIND MORE INFORMATION? National Heart, Lung, and Blood Institute: travelstabloid.com   This information is not intended to replace advice given to you by your health care provider. Make sure you discuss any questions you have with your health care provider.   Document Released: 06/22/2011 Document Revised: 07/24/2014 Document Reviewed: 05/07/2013 Elsevier Interactive Patient Education [employer].

## 2016-05-09 NOTE — Progress Notes (Signed)
Pre visit review using our clinic review tool, if applicable. No additional management support is needed unless otherwise documented below in the visit note. 

## 2016-05-09 NOTE — Assessment & Plan Note (Signed)
Chronic. Recommended weight loss to reduce burden on knees.

## 2016-05-09 NOTE — Assessment & Plan Note (Signed)
Asymptomatic. Uses albuterol inhaler infrequently. Will continue to monitor. Exam with clear lungs.

## 2016-05-09 NOTE — Assessment & Plan Note (Signed)
Managed on HCTZ 50 mg and Rampiril 10 mg per prior PCP. BP above goal in clinic today. Will have him monitor at home. If continues to be above goal, will add in Amlodipine. Consider reducing dose of HCTZ, will check BMP next visit.

## 2016-05-26 ENCOUNTER — Ambulatory Visit: Payer: BLUE CROSS/BLUE SHIELD | Admitting: Family Medicine

## 2016-05-26 ENCOUNTER — Other Ambulatory Visit: Payer: Self-pay | Admitting: Primary Care

## 2016-05-26 DIAGNOSIS — E039 Hypothyroidism, unspecified: Secondary | ICD-10-CM

## 2016-05-26 DIAGNOSIS — E038 Other specified hypothyroidism: Secondary | ICD-10-CM

## 2016-05-26 DIAGNOSIS — Z125 Encounter for screening for malignant neoplasm of prostate: Secondary | ICD-10-CM

## 2016-05-26 DIAGNOSIS — I10 Essential (primary) hypertension: Secondary | ICD-10-CM

## 2016-05-26 DIAGNOSIS — E785 Hyperlipidemia, unspecified: Secondary | ICD-10-CM

## 2016-05-26 DIAGNOSIS — E119 Type 2 diabetes mellitus without complications: Secondary | ICD-10-CM

## 2016-06-01 ENCOUNTER — Other Ambulatory Visit (INDEPENDENT_AMBULATORY_CARE_PROVIDER_SITE_OTHER): Payer: BLUE CROSS/BLUE SHIELD

## 2016-06-01 DIAGNOSIS — Z125 Encounter for screening for malignant neoplasm of prostate: Secondary | ICD-10-CM

## 2016-06-01 DIAGNOSIS — E785 Hyperlipidemia, unspecified: Secondary | ICD-10-CM | POA: Diagnosis not present

## 2016-06-01 DIAGNOSIS — E038 Other specified hypothyroidism: Secondary | ICD-10-CM

## 2016-06-01 DIAGNOSIS — E039 Hypothyroidism, unspecified: Secondary | ICD-10-CM | POA: Diagnosis not present

## 2016-06-01 DIAGNOSIS — E119 Type 2 diabetes mellitus without complications: Secondary | ICD-10-CM | POA: Diagnosis not present

## 2016-06-01 DIAGNOSIS — I10 Essential (primary) hypertension: Secondary | ICD-10-CM

## 2016-06-01 LAB — COMPREHENSIVE METABOLIC PANEL
ALT: 43 U/L (ref 0–53)
AST: 33 U/L (ref 0–37)
Albumin: 4.5 g/dL (ref 3.5–5.2)
Alkaline Phosphatase: 46 U/L (ref 39–117)
BUN: 15 mg/dL (ref 6–23)
CO2: 32 mEq/L (ref 19–32)
Calcium: 10.1 mg/dL (ref 8.4–10.5)
Chloride: 102 mEq/L (ref 96–112)
Creatinine, Ser: 1.07 mg/dL (ref 0.40–1.50)
GFR: 74.02 mL/min (ref >60.00)
Glucose, Bld: 102 mg/dL — ABNORMAL HIGH (ref 70–99)
Potassium: 4.4 mEq/L (ref 3.5–5.1)
Sodium: 142 mEq/L (ref 135–145)
Total Bilirubin: 0.3 mg/dL (ref 0.2–1.2)
Total Protein: 7.1 g/dL (ref 6.0–8.3)

## 2016-06-01 LAB — MICROALBUMIN / CREATININE URINE RATIO
Creatinine,U: 110.9 mg/dL
Microalb Creat Ratio: 1 mg/g (ref 0.0–30.0)
Microalb, Ur: 1.1 mg/dL (ref 0.0–1.9)

## 2016-06-01 LAB — PSA: PSA: 0.78 ng/mL (ref 0.10–4.00)

## 2016-06-01 LAB — LIPID PANEL
Cholesterol: 152 mg/dL (ref 0–200)
HDL: 33.7 mg/dL — ABNORMAL LOW (ref >39.00)
LDL Cholesterol: 97 mg/dL (ref 0–99)
NonHDL: 117.81
Total CHOL/HDL Ratio: 4
Triglycerides: 106 mg/dL (ref 0.0–149.0)
VLDL: 21.2 mg/dL (ref 0.0–40.0)

## 2016-06-01 LAB — TSH: TSH: 6.62 u[IU]/mL — ABNORMAL HIGH (ref 0.35–4.50)

## 2016-06-01 LAB — HEMOGLOBIN A1C: Hgb A1c MFr Bld: 7.5 % — ABNORMAL HIGH (ref 4.6–6.5)

## 2016-06-05 ENCOUNTER — Encounter: Payer: Self-pay | Admitting: Primary Care

## 2016-06-05 ENCOUNTER — Ambulatory Visit (INDEPENDENT_AMBULATORY_CARE_PROVIDER_SITE_OTHER): Payer: BLUE CROSS/BLUE SHIELD | Admitting: Primary Care

## 2016-06-05 VITALS — BP 124/80 | HR 85 | Temp 97.6°F | Ht 70.0 in | Wt 277.8 lb

## 2016-06-05 DIAGNOSIS — E038 Other specified hypothyroidism: Secondary | ICD-10-CM

## 2016-06-05 DIAGNOSIS — I10 Essential (primary) hypertension: Secondary | ICD-10-CM

## 2016-06-05 DIAGNOSIS — E119 Type 2 diabetes mellitus without complications: Secondary | ICD-10-CM

## 2016-06-05 DIAGNOSIS — Z0000 Encounter for general adult medical examination without abnormal findings: Secondary | ICD-10-CM | POA: Diagnosis not present

## 2016-06-05 DIAGNOSIS — Z794 Long term (current) use of insulin: Secondary | ICD-10-CM

## 2016-06-05 DIAGNOSIS — Z720 Tobacco use: Secondary | ICD-10-CM

## 2016-06-05 DIAGNOSIS — Z23 Encounter for immunization: Secondary | ICD-10-CM

## 2016-06-05 DIAGNOSIS — Z0001 Encounter for general adult medical examination with abnormal findings: Secondary | ICD-10-CM | POA: Insufficient documentation

## 2016-06-05 DIAGNOSIS — E039 Hypothyroidism, unspecified: Secondary | ICD-10-CM

## 2016-06-05 DIAGNOSIS — M170 Bilateral primary osteoarthritis of knee: Secondary | ICD-10-CM

## 2016-06-05 DIAGNOSIS — Z87891 Personal history of nicotine dependence: Secondary | ICD-10-CM

## 2016-06-05 DIAGNOSIS — E7800 Pure hypercholesterolemia, unspecified: Secondary | ICD-10-CM

## 2016-06-05 MED ORDER — ZOSTER VACCINE LIVE 19400 UNT/0.65ML ~~LOC~~ SUSR: 0.6500 mL | Freq: Once | SUBCUTANEOUS | 0 refills | Status: AC

## 2016-06-05 NOTE — Progress Notes (Signed)
Pre visit review using our clinic review tool, if applicable. No additional management support is needed unless otherwise documented below in the visit note. 

## 2016-06-05 NOTE — Patient Instructions (Addendum)
You were provided with a pneumonia vaccination today. You will be due again at age 65.  Take the Shingles vaccination to your pharmacy for administration. Check with your insurance company to see if they will cover. You must wait 30 days in between the pneumonia vaccination and shingles vaccination.  It is important that you improve your diet. Please limit carbohydrates in the form of white bread, rice, pasta, sweets, fast food, fried food, sugary drinks, etc. Increase your consumption of fresh fruits and vegetables, whole grains, lean protein.  Ensure you are consuming 64 ounces of water daily.  Start exercising. You should be getting 150 minutes of moderate intensity exercise weekly.  Please notify me if you are interested in seeing an orthopedic doctor regarding your knee.   You will be contacted regarding your referral for the lung cancer screening program.  Please let us know if you have not heard back within one week.   Schedule a lab only appointment in 4 weeks for recheck of your thyroid function.   Follow up in 1 year for annual physical or sooner if needed.  It was a pleasure to see you today! Happy Thanksgiving!

## 2016-06-05 NOTE — Addendum Note (Signed)
Addended by: Jacqualin Combes on: 06/05/2016 04:13 PM   Modules accepted: Orders

## 2016-06-05 NOTE — Assessment & Plan Note (Signed)
Quit in 2016. No alarm signs. Referral placed for lung cancer screening program.

## 2016-06-05 NOTE — Assessment & Plan Note (Signed)
Stable in the office today, continue current regimen. 

## 2016-06-05 NOTE — Assessment & Plan Note (Signed)
Following with endocrinology. Recent A1C of 7.5, urine microalbumin negative. Managed on fenofibrate for lipid control. Pneumonia 23 provided today as he denies prior pneumonia vaccinations. Continue current regimen.

## 2016-06-05 NOTE — Assessment & Plan Note (Signed)
Stable on recent labs. Continue fenofibrate.

## 2016-06-05 NOTE — Progress Notes (Signed)
Subjective:    Patient ID: ANTOINIO GOBBI, male    DOB: [birth year], 65 y.o.   MRN: [name]  HPI  Mr. Stewardson is a 65 year old male who presents today for complete physical.  Immunizations: -Tetanus: Completed in 2016 -Influenza: Completed in October 2017 -Pneumonia: Never completed.  -Shingles: Never completed.   Diet: He endorses a fair diet. Breakfast: Cereal, eggs, toast, grits, sausage  Lunch: Salad, sandwich Dinner: Chicken breast, hamburger steak, salad Snacks: None Desserts: 2 times weekly Beverages: Coffee, un-sweet tea, water  Exercise: He does not currently exercise Eye exam: Completed in May 2017. Dental exam: Completes annually. Colonoscopy: Completed in 2016. Several polyps, due in 2021. PSA: Completed in 2017.    Review of Systems  Constitutional: Positive for fatigue. Negative for unexpected weight change.  HENT: Negative for rhinorrhea.   Respiratory: Negative for cough and shortness of breath.   Cardiovascular: Negative for chest pain and palpitations.  Gastrointestinal: Negative for constipation and diarrhea.  Endocrine: Negative for cold intolerance.  Genitourinary: Negative for difficulty urinating.  Musculoskeletal: Positive for arthralgias. Negative for myalgias.  Skin: Negative for rash.  Allergic/Immunologic: Negative for environmental allergies.  Neurological: Negative for dizziness, numbness and headaches.  Psychiatric/Behavioral:       Denies concerns for anxiety and depression.    Wt Readings from Last 3 Encounters:  06/05/16 277 lb 12.8 oz (126 kg)  05/09/16 275 lb 1.9 oz (124.8 kg)  11/26/15 278 lb (126.1 kg)        Past Medical History:  Diagnosis Date  . Arthritis    knee  . Bronchitis   . Colon polyp 2008  . Diabetes mellitus   . Hypertension      Social History   Social History  . Marital status: Married    Spouse name: N/A  . Number of children: N/A  . Years of education: N/A   Occupational History  . Not on  file.   Social History Main Topics  . Smoking status: Former Smoker    Packs/day: 2.00    Years: 35.00    Types: Cigarettes, E-cigarettes    Quit date: 12/11/2014  . Smokeless tobacco: Never Used     Comment: Pt still smokes e-cigarettes, occasionally a "drag"  . Alcohol use No  . Drug use: No  . Sexual activity: Not on file   Other Topics Concern  . Not on file   Social History Narrative   Married.   3 children. 2 grandchildren.   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     Past Surgical History:  Procedure Laterality Date  . COLONOSCOPY  2008, 2016   Dr Bary Castilla  . DERMATOFIBROMA  03/26/15   Procedure: Excision mass left leg;  Surgeon: Robert Bellow, MD;  Location: [facility] ORS;  Service: General;  Laterality: N/A;  . LIPOMA EXCISION N/A 03/26/2015      . TONSILLECTOMY      Family History  Problem Relation Age of Onset  . Stroke Mother   . Diabetes Mother   . Stroke Father   . Diabetes Other   . Other Daughter 25    precancerous colon polyp   . Diabetes Maternal Grandmother   . Diabetes Paternal Grandmother     Allergies  Allergen Reactions  . Atorvastatin     Other reaction(s): Headache  . Other Other (See Comments)    STATIN DRUG-CAUSED MUSCLE PAIN/LETHARGIC    Current Outpatient  Prescriptions on File Prior to Visit  Medication Sig Dispense Refill  . aspirin EC 81 MG tablet Take 81 mg by mouth daily.    . fenofibrate (TRICOR) 145 MG tablet Take 145 mg by mouth daily.    . hydrochlorothiazide (HYDRODIURIL) 50 MG tablet TAKE 1 TABLET BY MOUTH DAILY 90 tablet 1  . Insulin Glargine (LANTUS SOLOSTAR) 100 UNIT/ML Solostar Pen Inject 10 Units into the skin daily at 10 pm. Increase by 2 units daily until fasting is 130 in am. (Patient taking differently: Inject 55-75 units into the skin once a day) 5 pen PRN  . liraglutide (VICTOZA) 18 MG/3ML SOPN Inject 1.8 mg into the skin daily.     . metFORMIN (GLUCOPHAGE) 1000  MG tablet TAKE 1 TABLET BY MOUTH TWICE A DAY 180 tablet 1  . ramipril (ALTACE) 10 MG capsule TAKE ONE (1) CAPSULE BY MOUTH 2 TIMES DAILY 180 capsule 1  . ranitidine (ZANTAC) 150 MG tablet Take 150 mg by mouth 2 (two) times daily.     No current facility-administered medications on file prior to visit.     BP 124/80   Pulse 85   Temp 97.6 F (36.4 C) (Oral)   Ht 5\' 10"  (1.778 m)   Wt 277 lb 12.8 oz (126 kg)   SpO2 97%   BMI 39.86 kg/m    Objective:   Physical Exam  Constitutional: He is oriented to person, place, and time. He appears well-nourished.  HENT:  Right Ear: Tympanic membrane and ear canal normal.  Left Ear: Tympanic membrane and ear canal normal.  Nose: Nose normal. Right sinus exhibits no maxillary sinus tenderness and no frontal sinus tenderness. Left sinus exhibits no maxillary sinus tenderness and no frontal sinus tenderness.  Mouth/Throat: Oropharynx is clear and moist.  Eyes: Conjunctivae and EOM are normal. Pupils are equal, round, and reactive to light.  Neck: Neck supple. Carotid bruit is not present. No thyromegaly present.  Cardiovascular: Normal rate, regular rhythm and normal heart sounds.   Pulmonary/Chest: Effort normal and breath sounds normal. He has no wheezes. He has no rales.  Abdominal: Soft. Bowel sounds are normal. There is no tenderness.  Musculoskeletal:       Left knee: He exhibits decreased range of motion. He exhibits no swelling.  Pain to left knee, history of chronic arthritis.  Neurological: He is alert and oriented to person, place, and time. He has normal reflexes. No cranial nerve deficit.  Skin: Skin is warm and dry.  Psychiatric: He has a normal mood and affect.          Assessment & Plan:

## 2016-06-05 NOTE — Assessment & Plan Note (Signed)
TSH at 6 on recent labs. TSH in 2016 unremarkable. Mostly asymptomatic with some fatigue, also weight gain. Will recheck TSH in 4 weeks, if above goal then will initiate treatment.

## 2016-06-05 NOTE — Assessment & Plan Note (Signed)
Pneumovax provided today as he declines prior history of administration. Influenza UTD. Rx for Zostavax provided with instructions to obtain 30 days after pneumonia vaccination today. TD UTD. Colonoscopy UTD, due in 2021. PSA unremarkable. Exam mostly unremarkable, chronic arthritis to left knee. Labs stable. Follow up in 1 year for annual physical.

## 2016-06-05 NOTE — Assessment & Plan Note (Signed)
Moderate pain to left knee. Likely needs knee replacement. Discussed to work on weight loss. He will notify if/when he's ready for ortho referral.

## 2016-06-16 ENCOUNTER — Telehealth: Payer: Self-pay | Admitting: *Deleted

## 2016-06-16 NOTE — Telephone Encounter (Signed)
Received referral for initial lung cancer screening scan. Contacted patient and obtained smoking history,(former, quit 12/11/14, 70 pack year) as well as answering questions related to screening process. Patient denies signs of lung cancer such as weight loss or hemoptysis. Patient denies comorbidity that would prevent curative treatment if lung cancer were found. Patient is tentatively scheduled for shared decision making visit and CT scan on 06/27/16, pending insurance approval from business office.

## 2016-06-26 ENCOUNTER — Other Ambulatory Visit: Payer: Self-pay | Admitting: *Deleted

## 2016-06-26 DIAGNOSIS — Z87891 Personal history of nicotine dependence: Secondary | ICD-10-CM

## 2016-06-27 ENCOUNTER — Ambulatory Visit
Admission: RE | Admit: 2016-06-27 | Discharge: 2016-06-27 | Disposition: A | Discharge status: Home / Self Care | Payer: BLUE CROSS/BLUE SHIELD | Source: Ambulatory Visit | Attending: Oncology | Admitting: Oncology

## 2016-06-27 ENCOUNTER — Inpatient Hospital Stay: Payer: BLUE CROSS/BLUE SHIELD | Attending: Oncology | Admitting: Oncology

## 2016-06-27 DIAGNOSIS — Z122 Encounter for screening for malignant neoplasm of respiratory organs: Secondary | ICD-10-CM

## 2016-06-27 DIAGNOSIS — I700 Atherosclerosis of aorta: Secondary | ICD-10-CM | POA: Diagnosis not present

## 2016-06-27 DIAGNOSIS — K760 Fatty (change of) liver, not elsewhere classified: Secondary | ICD-10-CM | POA: Diagnosis not present

## 2016-06-27 DIAGNOSIS — I2510 Atherosclerotic heart disease of native coronary artery without angina pectoris: Secondary | ICD-10-CM | POA: Diagnosis not present

## 2016-06-27 DIAGNOSIS — Z87891 Personal history of nicotine dependence: Secondary | ICD-10-CM | POA: Diagnosis not present

## 2016-06-27 DIAGNOSIS — J439 Emphysema, unspecified: Secondary | ICD-10-CM | POA: Diagnosis not present

## 2016-06-28 ENCOUNTER — Encounter: Payer: Self-pay | Admitting: *Deleted

## 2016-06-30 DIAGNOSIS — Z87891 Personal history of nicotine dependence: Secondary | ICD-10-CM | POA: Insufficient documentation

## 2016-06-30 NOTE — Progress Notes (Signed)
In accordance with CMS guidelines, patient has met eligibility criteria including age, absence of signs or symptoms of lung cancer.  Social History  Substance Use Topics  . Smoking status: Former Smoker    Packs/day: 2.00    Years: 35.00    Types: Cigarettes, E-cigarettes    Quit date: 12/11/2014  . Smokeless tobacco: Never Used     Comment: Pt still smokes e-cigarettes, occasionally a "drag"  . Alcohol use No     A shared decision-making session was conducted prior to the performance of CT scan. This includes one or more decision aids, includes benefits and harms of screening, follow-up diagnostic testing, over-diagnosis, false positive rate, and total radiation exposure.  Counseling on the importance of adherence to annual lung cancer LDCT screening, impact of co-morbidities, and ability or willingness to undergo diagnosis and treatment is imperative for compliance of the program.  Counseling on the importance of continued smoking cessation for former smokers; the importance of smoking cessation for current smokers, and information about tobacco cessation interventions have been given to patient including [place] and [facility] quit [place] programs.  Written order for lung cancer screening with LDCT has been given to the patient and any and all questions have been answered to the best of my abilities.   Yearly follow up will be coordinated by Burgess Estelle, Thoracic Navigator.

## 2016-07-04 ENCOUNTER — Other Ambulatory Visit (INDEPENDENT_AMBULATORY_CARE_PROVIDER_SITE_OTHER): Payer: BLUE CROSS/BLUE SHIELD

## 2016-07-04 DIAGNOSIS — R946 Abnormal results of thyroid function studies: Secondary | ICD-10-CM | POA: Diagnosis not present

## 2016-07-04 DIAGNOSIS — R7989 Other specified abnormal findings of blood chemistry: Secondary | ICD-10-CM

## 2016-07-04 LAB — T4, FREE: Free T4: 0.89 ng/dL (ref 0.60–1.60)

## 2016-07-04 LAB — TSH: TSH: 5.94 u[IU]/mL — ABNORMAL HIGH (ref 0.35–4.50)

## 2016-07-04 NOTE — Progress Notes (Signed)
l °

## 2016-07-05 ENCOUNTER — Other Ambulatory Visit: Payer: Self-pay | Admitting: Primary Care

## 2016-07-05 DIAGNOSIS — E039 Hypothyroidism, unspecified: Secondary | ICD-10-CM

## 2016-07-05 MED ORDER — LEVOTHYROXINE SODIUM 25 MCG PO TABS: ORAL_TABLET | ORAL | 1 refills | Status: DC

## 2016-07-06 ENCOUNTER — Ambulatory Visit (INDEPENDENT_AMBULATORY_CARE_PROVIDER_SITE_OTHER): Payer: BLUE CROSS/BLUE SHIELD | Admitting: Podiatry

## 2016-07-06 ENCOUNTER — Encounter: Payer: Self-pay | Admitting: Podiatry

## 2016-07-06 DIAGNOSIS — B351 Tinea unguium: Secondary | ICD-10-CM | POA: Diagnosis not present

## 2016-07-06 DIAGNOSIS — Q828 Other specified congenital malformations of skin: Secondary | ICD-10-CM | POA: Diagnosis not present

## 2016-07-06 DIAGNOSIS — M79676 Pain in unspecified toe(s): Secondary | ICD-10-CM | POA: Diagnosis not present

## 2016-07-06 DIAGNOSIS — E1149 Type 2 diabetes mellitus with other diabetic neurological complication: Secondary | ICD-10-CM

## 2016-07-06 NOTE — Progress Notes (Signed)
Patient ID: [name], male   DOB: Mar 05, 1951, 65 y.o.   MRN: [id number]  Subjective: 65 y.o. returns the office today for painful, elongated, thickened toenails which he cannot trim himself. Denies any redness or drainage around the nails. Also has painful calluses to his feet. No swelling or redness.  Denies any acute changes since last appointment and no new complaints today. Denies any systemic complaints such as fevers, chills, nausea, vomiting.   Objective: AAO 3, NAD DP/PT pulses palpable, CRT less than 3 seconds Decreased sensation with SWMF.  Nails hypertrophic, dystrophic, elongated, brittle, discolored 10. There is tenderness overlying the nails 1-5 bilaterally. There is no surrounding erythema or drainage along the nail sites. Hyperkeratotic lesions right submetatarsal 2 and submetatarsal 5. Upon debridement no underlying ulceration, drainage or other signs of infection. On the right leg is a flesh colored nodule which has been ongoing. He has seen his dermatologist for this. The one on the left was previously removed and was benign.  No open lesions or pre-ulcerative lesions are identified. No other areas of tenderness bilateral lower extremities. No overlying edema, erythema, increased warmth. No pain with calf compression, swelling, warmth, erythema.  Assessment: Patient presents with symptomatic onychomycosis, hyperkerotic lesions  Plan: -Treatment options including alternatives, risks, complications were discussed -Nails sharply debrided 10 without complication/bleeding.  -Hyperkeratotic lesions 2 with debrided without competitions or bleeding. -Discussed daily foot inspection. If there are any changes, to call the office immediately.  -Follow-up in 3 months or sooner if any problems are to arise. In the meantime, encouraged to call the office with any questions, concerns, changes symptoms.  Celesta Gentile, DPM

## 2016-07-06 NOTE — Progress Notes (Signed)
See consultant note. I believe he has transferred care to you.

## 2016-07-23 ENCOUNTER — Other Ambulatory Visit: Payer: Self-pay | Admitting: Family Medicine

## 2016-08-07 ENCOUNTER — Other Ambulatory Visit (INDEPENDENT_AMBULATORY_CARE_PROVIDER_SITE_OTHER): Payer: BLUE CROSS/BLUE SHIELD

## 2016-08-07 DIAGNOSIS — E039 Hypothyroidism, unspecified: Secondary | ICD-10-CM

## 2016-08-07 LAB — TSH: TSH: 3.37 u[IU]/mL (ref 0.35–4.50)

## 2016-08-09 ENCOUNTER — Other Ambulatory Visit: Payer: Self-pay | Admitting: Family Medicine

## 2016-08-09 DIAGNOSIS — I10 Essential (primary) hypertension: Secondary | ICD-10-CM

## 2016-09-03 ENCOUNTER — Other Ambulatory Visit: Payer: Self-pay | Admitting: Primary Care

## 2016-09-03 DIAGNOSIS — E039 Hypothyroidism, unspecified: Secondary | ICD-10-CM

## 2016-09-09 ENCOUNTER — Other Ambulatory Visit: Payer: Self-pay | Admitting: Family Medicine

## 2016-09-09 DIAGNOSIS — E119 Type 2 diabetes mellitus without complications: Secondary | ICD-10-CM

## 2016-09-09 DIAGNOSIS — Z794 Long term (current) use of insulin: Principal | ICD-10-CM

## 2016-10-09 ENCOUNTER — Ambulatory Visit (INDEPENDENT_AMBULATORY_CARE_PROVIDER_SITE_OTHER): Payer: BLUE CROSS/BLUE SHIELD | Admitting: Primary Care

## 2016-10-09 ENCOUNTER — Encounter: Payer: Self-pay | Admitting: Primary Care

## 2016-10-09 VITALS — BP 150/78 | HR 84 | Temp 97.8°F | Ht 70.0 in | Wt 286.1 lb

## 2016-10-09 DIAGNOSIS — J209 Acute bronchitis, unspecified: Secondary | ICD-10-CM

## 2016-10-09 MED ORDER — DOXYCYCLINE HYCLATE 100 MG PO TABS: 100.0000 mg | ORAL_TABLET | Freq: Two times a day (BID) | ORAL | 0 refills | Status: DC

## 2016-10-09 NOTE — Progress Notes (Signed)
Pre visit review using our clinic review tool, if applicable. No additional management support is needed unless otherwise documented below in the visit note. 

## 2016-10-09 NOTE — Patient Instructions (Signed)
Start Doxycycline antibiotic. Take 1 tablet by mouth twice daily for 10 days.  Cough/Congestion: Try taking Mucinex DM. This will help loosen up the mucous in your chest. Ensure you take this medication with a full glass of water.  Shortness of Breath/Wheezing/Cough: Use the albuterol inhaler. Inhale 2 puffs into the lungs every 6 to 8 hours as needed for wheezing and/or shortness of breath.   Ensure you are staying hydrated with water and rest.  It was a pleasure to see you today!

## 2016-10-09 NOTE — Progress Notes (Signed)
Subjective:    Patient ID: [name], male    DOB: 1951-03-07, 66 y.o.   MRN: [id number]  HPI  [name] is a 66 year old male with a history of COPD, GERD, and allergic rhinitis who presents today with a chief complaint of cough. He also reports nasal congestion, sinus pressure, sore throat, fevers, fatigue. His cough is productive with green sputum. His symptoms began 2 weeks ago. His fevers are low grade. He's taken Coricidin OTC with temporary improvement. Overall he's not noticed any improvement.   Review of Systems  Constitutional: Positive for fatigue and fever.  HENT: Positive for congestion, ear pain, sinus pressure and sore throat.   Respiratory: Positive for cough and shortness of breath.   Cardiovascular: Negative for chest pain.       Past Medical History:  Diagnosis Date  . Arthritis    knee  . Bronchitis   . Colon polyp 2008  . Diabetes mellitus   . Hypertension      Social History   Social History  . Marital status: Married    Spouse name: N/A  . Number of children: N/A  . Years of education: N/A   Occupational History  . Not on file.   Social History Main Topics  . Smoking status: Former Smoker    Packs/day: 2.00    Years: 35.00    Types: Cigarettes, E-cigarettes    Quit date: 12/11/2014  . Smokeless tobacco: Never Used  . Alcohol use No  . Drug use: No  . Sexual activity: Not on file   Other Topics Concern  . Not on file   Social History Narrative   Married.   3 children. 2 grandchildren.   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     Past Surgical History:  Procedure Laterality Date  . COLONOSCOPY  2008, 2016   Dr Bary Castilla  . DERMATOFIBROMA  03/26/15   Procedure: Excision mass left leg;  Surgeon: Robert Bellow, MD;  Location: [facility] ORS;  Service: General;  Laterality: N/A;  . LIPOMA EXCISION N/A 03/26/2015      . TONSILLECTOMY      Family History  Problem Relation Age of Onset    . Stroke Mother   . Diabetes Mother   . Stroke Father   . Diabetes Other   . Other Daughter 25    precancerous colon polyp   . Diabetes Maternal Grandmother   . Diabetes Paternal Grandmother     Allergies  Allergen Reactions  . Atorvastatin     Other reaction(s): Headache  . Other Other (See Comments)    STATIN DRUG-CAUSED MUSCLE PAIN/LETHARGIC    Current Outpatient Prescriptions on File Prior to Visit  Medication Sig Dispense Refill  . aspirin EC 81 MG tablet Take 81 mg by mouth daily.    . fenofibrate (TRICOR) 145 MG tablet Take 145 mg by mouth daily.    . hydrochlorothiazide (HYDRODIURIL) 50 MG tablet TAKE 1 TABLET BY MOUTH DAILY 90 tablet 3  . Insulin Glargine (LANTUS SOLOSTAR) 100 UNIT/ML Solostar Pen Inject 10 Units into the skin daily at 10 pm. Increase by 2 units daily until fasting is 130 in am. (Patient taking differently: Inject 55-75 units into the skin once a day) 5 pen PRN  . levothyroxine (SYNTHROID, LEVOTHROID) 25 MCG tablet TAKE 1 TABLET BY MOUTH EVERY MORNING 30 MINUTES BEFORE BREAKFAST. 30 tablet 5  . liraglutide (VICTOZA) 18 MG/3ML  SOPN Inject 1.8 mg into the skin daily.     . metFORMIN (GLUCOPHAGE) 1000 MG tablet TAKE 1 TABLET BY MOUTH TWICE A DAY 180 tablet 1  . ramipril (ALTACE) 10 MG capsule TAKE ONE (1) CAPSULE BY MOUTH 2 TIMES DAILY 180 capsule 3  . ranitidine (ZANTAC) 150 MG tablet Take 150 mg by mouth 2 (two) times daily.     No current facility-administered medications on file prior to visit.     BP (!) 150/78   Pulse 84   Temp 97.8 F (36.6 C) (Oral)   Ht 5\' 10"  (1.778 m)   Wt 286 lb 1.9 oz (129.8 kg)   SpO2 97%   BMI 41.05 kg/m    Objective:   Physical Exam  Constitutional: He appears well-nourished. He does not appear ill.  HENT:  Right Ear: Tympanic membrane and ear canal normal.  Left Ear: Tympanic membrane and ear canal normal.  Nose: No mucosal edema. Right sinus exhibits no maxillary sinus tenderness and no frontal sinus  tenderness. Left sinus exhibits no maxillary sinus tenderness and no frontal sinus tenderness.  Mouth/Throat: Oropharynx is clear and moist.  Eyes: Conjunctivae are normal.  Neck: Neck supple.  Cardiovascular: Normal rate and regular rhythm.   Pulmonary/Chest: Effort normal. He has no decreased breath sounds. He has wheezes in the right upper field and the left upper field. He has rhonchi in the right upper field, the right lower field, the left upper field and the left lower field. He has no rales.  Skin: Skin is warm and dry.          Assessment & Plan:  Acute Bronchitis:  Cough, congestion, sinus pressure, fatigue, fevers x 2 weeks. Overall no improvement on OTC treatment. Exam today suspicious for bacterial involvement based off exam and presentation. Rx for Doxycycline course sent to pharmacy, cover for atypical involvement. Discussed Mucinex DM. Fluids, rest.  Sheral Flow, NP

## 2016-10-12 ENCOUNTER — Ambulatory Visit: Payer: BLUE CROSS/BLUE SHIELD | Admitting: Podiatry

## 2016-10-16 ENCOUNTER — Ambulatory Visit (INDEPENDENT_AMBULATORY_CARE_PROVIDER_SITE_OTHER): Payer: BLUE CROSS/BLUE SHIELD | Admitting: Podiatry

## 2016-10-16 DIAGNOSIS — M79676 Pain in unspecified toe(s): Secondary | ICD-10-CM

## 2016-10-16 DIAGNOSIS — E1149 Type 2 diabetes mellitus with other diabetic neurological complication: Secondary | ICD-10-CM

## 2016-10-16 DIAGNOSIS — B351 Tinea unguium: Secondary | ICD-10-CM | POA: Diagnosis not present

## 2016-10-16 DIAGNOSIS — Q828 Other specified congenital malformations of skin: Secondary | ICD-10-CM

## 2016-10-16 NOTE — Progress Notes (Signed)
Complaint:  Visit Type: Patient returns to my office for continued preventative foot care services. Complaint: Patient states" my nails have grown long and thick and become painful to walk and wear shoes" Patient has been diagnosed with DM with no foot complications. The patient presents for preventative foot care services. No changes to ROS.  Painful callus right foot.  Podiatric Exam: Vascular: dorsalis pedis and posterior tibial pulses are palpable bilateral. Capillary return is immediate. Temperature gradient is WNL. Skin turgor WNL  Sensorium: Diminished  Semmes Weinstein monofilament test. Normal tactile sensation bilaterally. Nail Exam: Pt has thick disfigured discolored nails with subungual debris noted bilateral entire nail hallux through fifth toenails Ulcer Exam: There is no evidence of ulcer or pre-ulcerative changes or infection. Orthopedic Exam: Muscle tone and strength are WNL. No limitations in general ROM. No crepitus or effusions noted. Hallux malleus and hammer toes  B/L. Skin: Porokeratosis sub3,5 right foot. No infection or ulcers  Diagnosis:  Onychomycosis, , Pain in right toe, pain in left toes, Porokeratosis right foot  Treatment & Plan Procedures and Treatment: Consent by patient was obtained for treatment procedures. The patient understood the discussion of treatment and procedures well. All questions were answered thoroughly reviewed. Debridement of mycotic and hypertrophic toenails, 1 through 5 bilateral and clearing of subungual debris. No ulceration, no infection noted. Debride porokeratosis right foot. Return Visit-Office Procedure: Patient instructed to return to the office for a follow up visit 3 months for continued evaluation and treatment.    Gardiner Barefoot DPM

## 2016-11-08 ENCOUNTER — Telehealth: Payer: Self-pay | Admitting: Primary Care

## 2016-11-08 ENCOUNTER — Ambulatory Visit (INDEPENDENT_AMBULATORY_CARE_PROVIDER_SITE_OTHER): Payer: BLUE CROSS/BLUE SHIELD | Admitting: Primary Care

## 2016-11-08 ENCOUNTER — Encounter: Payer: Self-pay | Admitting: Primary Care

## 2016-11-08 VITALS — BP 164/92 | HR 94 | Temp 98.2°F | Ht 70.0 in | Wt 284.8 lb

## 2016-11-08 DIAGNOSIS — L03818 Cellulitis of other sites: Secondary | ICD-10-CM

## 2016-11-08 MED ORDER — SULFAMETHOXAZOLE-TRIMETHOPRIM 800-160 MG PO TABS: 1.0000 | ORAL_TABLET | Freq: Two times a day (BID) | ORAL | 0 refills | Status: DC

## 2016-11-08 NOTE — Telephone Encounter (Signed)
Noted  

## 2016-11-08 NOTE — Telephone Encounter (Signed)
°  Patient Name: [name]  DOB: 01-Jul-1951    Initial Comment Caller states he popped a bump in his groin and it was filled with blood and pus.   Nurse Assessment  Nurse: Erling Cruz, RN, Morey Hummingbird Date/Time (Eastern Time): 11/08/2016 10:04:09 AM  Confirm and document reason for call. If symptomatic, describe symptoms. ---Caller states he popped a bump in his groin; it was filled with blood and pus. Noticed bump over the past 3-4 days. Was on the left side where his underwear seam meets his groin & about the size of tip of pinky finger. No fever that he knows of. 5-6 years ago had a similar bump underarm & was dx w/ MRSA.  Does the patient have any new or worsening symptoms? ---Yes  Will a triage be completed? ---Yes  Related visit to physician within the last 2 weeks? ---N/A  Does the PT have any chronic conditions? (i.e. diabetes, asthma, etc.) ---Yes  List chronic conditions. ---DM Type 2, MRSA  Is this a behavioral health or substance abuse call? ---No     Guidelines    Guideline Title Affirmed Question Affirmed Notes  Boil (Skin Abscess) [1] Boil > 1/2 inch across (> 12 mm; larger than a marble) AND [2] center is soft or pus colored    Final Disposition User   See Physician within 24 Hours Love, RN, Morey Hummingbird    Comments  Appointment scheduled for today @4 :15pm w/ Alma Friendly, NP.   Referrals  REFERRED TO PCP OFFICE   Disagree/Comply: Comply

## 2016-11-08 NOTE — Telephone Encounter (Signed)
Pt has appt with Gentry Fitz NP today at 4:15.

## 2016-11-08 NOTE — Progress Notes (Signed)
Subjective:    Patient ID: [name], male    DOB: [birth year], 66 y.o.   MRN: [id number]  HPI  [name] is a 66 year old male who presents today with a chief complaint of skin mass. His mass is present to the left groin for which he noticed Sunday this past weekend (3 days ago). He popped the mass this morning with expulsion of whitish/green puss and blood with foul smell from the site. He denies fevers. He's noticed mild discomfort. He's not taken anything OTC for his symptoms.   Review of Systems  Constitutional: Negative for fever.  Skin: Positive for color change and wound.       Past Medical History:  Diagnosis Date  . Arthritis    knee  . Bronchitis   . Colon polyp 2008  . Diabetes mellitus   . Hypertension      Social History   Social History  . Marital status: Married    Spouse name: N/A  . Number of children: N/A  . Years of education: N/A   Occupational History  . Not on file.   Social History Main Topics  . Smoking status: Former Smoker    Packs/day: 2.00    Years: 35.00    Types: Cigarettes, E-cigarettes    Quit date: 12/11/2014  . Smokeless tobacco: Never Used  . Alcohol use No  . Drug use: No  . Sexual activity: Not on file   Other Topics Concern  . Not on file   Social History Narrative   Married.   3 children. 2 grandchildren.   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     Past Surgical History:  Procedure Laterality Date  . COLONOSCOPY  2008, 2016   Dr Bary Castilla  . DERMATOFIBROMA  03/26/15   Procedure: Excision mass left leg;  Surgeon: Robert Bellow, MD;  Location: [facility] ORS;  Service: General;  Laterality: N/A;  . LIPOMA EXCISION N/A 03/26/2015      . TONSILLECTOMY      Family History  Problem Relation Age of Onset  . Stroke Mother   . Diabetes Mother   . Stroke Father   . Diabetes Other   . Other Daughter 25    precancerous colon polyp   . Diabetes Maternal Grandmother   .  Diabetes Paternal Grandmother     Allergies  Allergen Reactions  . Atorvastatin     Other reaction(s): Headache  . Other Other (See Comments)    STATIN DRUG-CAUSED MUSCLE PAIN/LETHARGIC    Current Outpatient Prescriptions on File Prior to Visit  Medication Sig Dispense Refill  . aspirin EC 81 MG tablet Take 81 mg by mouth daily.    . fenofibrate (TRICOR) 145 MG tablet Take 145 mg by mouth daily.    . hydrochlorothiazide (HYDRODIURIL) 50 MG tablet TAKE 1 TABLET BY MOUTH DAILY 90 tablet 3  . Insulin Glargine (LANTUS SOLOSTAR) 100 UNIT/ML Solostar Pen Inject 10 Units into the skin daily at 10 pm. Increase by 2 units daily until fasting is 130 in am. (Patient taking differently: Inject 55-75 units into the skin once a day) 5 pen PRN  . levothyroxine (SYNTHROID, LEVOTHROID) 25 MCG tablet TAKE 1 TABLET BY MOUTH EVERY MORNING 30 MINUTES BEFORE BREAKFAST. 30 tablet 5  . liraglutide (VICTOZA) 18 MG/3ML SOPN Inject 1.8 mg into the skin daily.     . metFORMIN (GLUCOPHAGE) 1000 MG tablet TAKE 1 TABLET  BY MOUTH TWICE A DAY 180 tablet 1  . ramipril (ALTACE) 10 MG capsule TAKE ONE (1) CAPSULE BY MOUTH 2 TIMES DAILY 180 capsule 3  . ranitidine (ZANTAC) 150 MG tablet Take 150 mg by mouth 2 (two) times daily.     No current facility-administered medications on file prior to visit.     BP (!) 164/92   Pulse 94   Temp 98.2 F (36.8 C) (Oral)   Ht 5\' 10"  (1.778 m)   Wt 284 lb 12.8 oz (129.2 kg)   SpO2 95%   BMI 40.86 kg/m    Objective:   Physical Exam  Constitutional: He appears well-nourished.  Neck: Neck supple.  Cardiovascular: Normal rate.   Pulmonary/Chest: Effort normal.  Skin: Skin is warm and dry.  2 cm oblong deep abscess to left groin. No drainage.          Assessment & Plan:  Abscess:  Located to left groin x 3 days. Exam today with evidence of drained abscess with surround cellulitis. Rx for Bactrim DS course sent to pharmacy. Discussed warm compresses, dressings at  home, Ibuprofen PRN pain. Return precautions provided.  Sheral Flow, NP

## 2016-11-08 NOTE — Progress Notes (Signed)
Pre visit review using our clinic review tool, if applicable. No additional management support is needed unless otherwise documented below in the visit note. 

## 2016-11-08 NOTE — Patient Instructions (Signed)
Start Bactrim DS (sulfamethoxazole/trimethoprim) tablets for skin infection. Take 1 tablet by mouth twice daily for 7 days.  Apply warm compresses to the site to allow it to drain. It will drain over the next several days when on the antibiotics, this is normal.  Please call me if you notice increased pain, swelling, fevers.  It was a pleasure to see you today!

## 2017-01-22 ENCOUNTER — Encounter: Payer: Self-pay | Admitting: Podiatry

## 2017-01-22 ENCOUNTER — Ambulatory Visit (INDEPENDENT_AMBULATORY_CARE_PROVIDER_SITE_OTHER): Payer: BLUE CROSS/BLUE SHIELD | Admitting: Podiatry

## 2017-01-22 DIAGNOSIS — E1149 Type 2 diabetes mellitus with other diabetic neurological complication: Secondary | ICD-10-CM

## 2017-01-22 DIAGNOSIS — Q828 Other specified congenital malformations of skin: Secondary | ICD-10-CM | POA: Diagnosis not present

## 2017-01-22 DIAGNOSIS — B351 Tinea unguium: Secondary | ICD-10-CM | POA: Diagnosis not present

## 2017-01-22 DIAGNOSIS — M79676 Pain in unspecified toe(s): Secondary | ICD-10-CM

## 2017-01-22 NOTE — Progress Notes (Signed)
Complaint:  Visit Type: Patient returns to my office for continued preventative foot care services. Complaint: Patient states" my nails have grown long and thick and become painful to walk and wear shoes" Patient has been diagnosed with DM with no foot complications. The patient presents for preventative foot care services. No changes to ROS.  Painful callus right foot.  Podiatric Exam: Vascular: dorsalis pedis and posterior tibial pulses are palpable bilateral. Capillary return is immediate. Temperature gradient is WNL. Skin turgor WNL  Sensorium: Diminished  Semmes Weinstein monofilament test. Normal tactile sensation bilaterally. Nail Exam: Pt has thick disfigured discolored nails with subungual debris noted bilateral entire nail hallux through fifth toenails Ulcer Exam: There is no evidence of ulcer or pre-ulcerative changes or infection. Orthopedic Exam: Muscle tone and strength are WNL. No limitations in general ROM. No crepitus or effusions noted. Hallux malleus and hammer toes  B/L. Skin: Porokeratosis sub3,5 right foot. No infection or ulcers  Diagnosis:  Onychomycosis, , Pain in right toe, pain in left toes, Porokeratosis right foot  Treatment & Plan Procedures and Treatment: Consent by patient was obtained for treatment procedures. The patient understood the discussion of treatment and procedures well. All questions were answered thoroughly reviewed. Debridement of mycotic and hypertrophic toenails, 1 through 5 bilateral and clearing of subungual debris. No ulceration, no infection noted. Debride porokeratosis right foot. Return Visit-Office Procedure: Patient instructed to return to the office for a follow up visit 3 months for continued evaluation and treatment.    Gardiner Barefoot DPM

## 2017-02-23 ENCOUNTER — Encounter: Payer: Self-pay | Admitting: Primary Care

## 2017-02-23 ENCOUNTER — Ambulatory Visit (INDEPENDENT_AMBULATORY_CARE_PROVIDER_SITE_OTHER): Payer: BLUE CROSS/BLUE SHIELD | Admitting: Primary Care

## 2017-02-23 VITALS — BP 146/76 | HR 91 | Temp 98.4°F | Ht 70.0 in | Wt 295.4 lb

## 2017-02-23 DIAGNOSIS — R0981 Nasal congestion: Secondary | ICD-10-CM

## 2017-02-23 MED ORDER — FLUTICASONE PROPIONATE 50 MCG/ACT NA SUSP: 1.0000 | Freq: Two times a day (BID) | NASAL | 0 refills | Status: DC | PRN

## 2017-02-23 NOTE — Patient Instructions (Signed)
Nasal Congestion/Sinus Pressure: Try using Flonase (fluticasone) nasal spray. Instill 1 spray in each nostril twice daily.   Start Zyrtec tablets once daily at bedtime to help with runny nose.  Cough/Congestion: Try taking Mucinex DM. This will help loosen up the mucous in your chest. Ensure you take this medication with a full glass of water.  Ensure you are staying hydrated with water and rest. Please call me Wednesday next week if you are worse, running fevers of 101.5 or greater, experience shortness of breath.  It was a pleasure to see you today!   Upper Respiratory Infection, Adult Most upper respiratory infections (URIs) are a viral infection of the air passages leading to the lungs. A URI affects the nose, throat, and upper air passages. The most common type of URI is nasopharyngitis and is typically referred to as "the common cold." URIs run their course and usually go away on their own. Most of the time, a URI does not require medical attention, but sometimes a bacterial infection in the upper airways can follow a viral infection. This is called a secondary infection. Sinus and middle ear infections are common types of secondary upper respiratory infections. Bacterial pneumonia can also complicate a URI. A URI can worsen asthma and chronic obstructive pulmonary disease (COPD). Sometimes, these complications can require emergency medical care and may be life threatening. What are the causes? Almost all URIs are caused by viruses. A virus is a type of germ and can spread from one person to another. What increases the risk? You may be at risk for a URI if:  You smoke.  You have chronic heart or lung disease.  You have a weakened defense (immune) system.  You are very young or very old.  You have nasal allergies or asthma.  You work in crowded or poorly ventilated areas.  You work in health care facilities or schools.  What are the signs or symptoms? Symptoms typically  develop 2-3 days after you come in contact with a cold virus. Most viral URIs last 7-10 days. However, viral URIs from the influenza virus (flu virus) can last 14-18 days and are typically more severe. Symptoms may include:  Runny or stuffy (congested) nose.  Sneezing.  Cough.  Sore throat.  Headache.  Fatigue.  Fever.  Loss of appetite.  Pain in your forehead, behind your eyes, and over your cheekbones (sinus pain).  Muscle aches.  How is this diagnosed? Your health care provider may diagnose a URI by:  Physical exam.  Tests to check that your symptoms are not due to another condition such as: ? Strep throat. ? Sinusitis. ? Pneumonia. ? Asthma.  How is this treated? A URI goes away on its own with time. It cannot be cured with medicines, but medicines may be prescribed or recommended to relieve symptoms. Medicines may help:  Reduce your fever.  Reduce your cough.  Relieve nasal congestion.  Follow these instructions at home:  Take medicines only as directed by your health care provider.  Gargle warm saltwater or take cough drops to comfort your throat as directed by your health care provider.  Use a warm mist humidifier or inhale steam from a shower to increase air moisture. This may make it easier to breathe.  Drink enough fluid to keep your urine clear or pale yellow.  Eat soups and other clear broths and maintain good nutrition.  Rest as needed.  Return to work when your temperature has returned to normal or as your health  care provider advises. You may need to stay home longer to avoid infecting others. You can also use a face mask and careful hand washing to prevent spread of the virus.  Increase the usage of your inhaler if you have asthma.  Do not use any tobacco products, including cigarettes, chewing tobacco, or electronic cigarettes. If you need help quitting, ask your health care provider. How is this prevented? The best way to protect  yourself from getting a cold is to practice good hygiene.  Avoid oral or hand contact with people with cold symptoms.  Wash your hands often if contact occurs.  There is no clear evidence that vitamin C, vitamin E, echinacea, or exercise reduces the chance of developing a cold. However, it is always recommended to get plenty of rest, exercise, and practice good nutrition. Contact a health care provider if:  You are getting worse rather than better.  Your symptoms are not controlled by medicine.  You have chills.  You have worsening shortness of breath.  You have brown or red mucus.  You have yellow or brown nasal discharge.  You have pain in your face, especially when you bend forward.  You have a fever.  You have swollen neck glands.  You have pain while swallowing.  You have white areas in the back of your throat. Get help right away if:  You have severe or persistent: ? Headache. ? Ear pain. ? Sinus pain. ? Chest pain.  You have chronic lung disease and any of the following: ? Wheezing. ? Prolonged cough. ? Coughing up blood. ? A change in your usual mucus.  You have a stiff neck.  You have changes in your: ? Vision. ? Hearing. ? Thinking. ? Mood. This information is not intended to replace advice given to you by your health care provider. Make sure you discuss any questions you have with your health care provider. Document Released: 12/27/2000 Document Revised: 03/05/2016 Document Reviewed: 10/08/2013 Elsevier Interactive Patient Education  2017 [employer].

## 2017-02-23 NOTE — Progress Notes (Signed)
Subjective:    Patient ID: [name], male    DOB: Feb 15, 1951, 66 y.o.   MRN: [id number]  HPI  [name] is a 66 year old male with a history of allergic rhinitis, diabetes, COPD who presents today with a chief complaint of cough. He also reports sore throat, voice hoarseness, sinus pressure. His symptoms began two days ago. His cough is productive with yellow sputum. He's been taking Coricidin, Ibuprofen, Vicks vapor rub with some improvement. He has been around his grandchildren that have the "sniffles".   Review of Systems  Constitutional: Negative for chills, fatigue and fever.  HENT: Positive for congestion, postnasal drip, rhinorrhea and sore throat. Negative for ear pain.   Respiratory: Positive for cough. Negative for shortness of breath.   Cardiovascular: Negative for chest pain.       Past Medical History:  Diagnosis Date  . Arthritis    knee  . Bronchitis   . Colon polyp 2008  . Diabetes mellitus   . Hypertension      Social History   Social History  . Marital status: Married    Spouse name: N/A  . Number of children: N/A  . Years of education: N/A   Occupational History  . Not on file.   Social History Main Topics  . Smoking status: Former Smoker    Packs/day: 2.00    Years: 35.00    Types: Cigarettes, E-cigarettes    Quit date: 12/11/2014  . Smokeless tobacco: Never Used  . Alcohol use No  . Drug use: No  . Sexual activity: Not on file   Other Topics Concern  . Not on file   Social History Narrative   Married.   3 children. 2 grandchildren.   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     Past Surgical History:  Procedure Laterality Date  . COLONOSCOPY  2008, 2016   Dr Bary Castilla  . DERMATOFIBROMA  03/26/15   Procedure: Excision mass left leg;  Surgeon: Robert Bellow, MD;  Location: [facility] ORS;  Service: General;  Laterality: N/A;  . LIPOMA EXCISION N/A 03/26/2015      . TONSILLECTOMY       Family History  Problem Relation Age of Onset  . Stroke Mother   . Diabetes Mother   . Stroke Father   . Diabetes Other   . Other Daughter 25       precancerous colon polyp   . Diabetes Maternal Grandmother   . Diabetes Paternal Grandmother     Allergies  Allergen Reactions  . Atorvastatin     Other reaction(s): Headache  . Other Other (See Comments)    STATIN DRUG-CAUSED MUSCLE PAIN/LETHARGIC    Current Outpatient Prescriptions on File Prior to Visit  Medication Sig Dispense Refill  . aspirin EC 81 MG tablet Take 81 mg by mouth daily.    . fenofibrate (TRICOR) 145 MG tablet Take 145 mg by mouth daily.    . hydrochlorothiazide (HYDRODIURIL) 50 MG tablet TAKE 1 TABLET BY MOUTH DAILY 90 tablet 3  . Insulin Glargine (LANTUS SOLOSTAR) 100 UNIT/ML Solostar Pen Inject 10 Units into the skin daily at 10 pm. Increase by 2 units daily until fasting is 130 in am. (Patient taking differently: Inject 55-75 units into the skin once a day) 5 pen PRN  . levothyroxine (SYNTHROID, LEVOTHROID) 25 MCG tablet TAKE 1 TABLET BY MOUTH EVERY MORNING 30 MINUTES BEFORE BREAKFAST. 30 tablet  5  . metFORMIN (GLUCOPHAGE) 1000 MG tablet TAKE 1 TABLET BY MOUTH TWICE A DAY 180 tablet 1  . ramipril (ALTACE) 10 MG capsule TAKE ONE (1) CAPSULE BY MOUTH 2 TIMES DAILY 180 capsule 3  . ranitidine (ZANTAC) 150 MG tablet Take 150 mg by mouth 2 (two) times daily.    Marland Kitchen liraglutide (VICTOZA) 18 MG/3ML SOPN Inject 1.8 mg into the skin daily.      No current facility-administered medications on file prior to visit.     BP (!) 146/76   Pulse 91   Temp 98.4 F (36.9 C) (Oral)   Ht 5\' 10"  (1.778 m)   Wt 295 lb 6.4 oz (134 kg)   SpO2 95%   BMI 42.39 kg/m    Objective:   Physical Exam  Constitutional: He appears well-nourished. He does not appear ill.  HENT:  Right Ear: Tympanic membrane and ear canal normal.  Left Ear: Tympanic membrane and ear canal normal.  Nose: Mucosal edema present. Right sinus exhibits  no maxillary sinus tenderness and no frontal sinus tenderness. Left sinus exhibits no maxillary sinus tenderness and no frontal sinus tenderness.  Mouth/Throat: Oropharynx is clear and moist.  Post nasal drip  Eyes: Conjunctivae are normal.  Neck: Neck supple.  Cardiovascular: Normal rate and regular rhythm.   Pulmonary/Chest: Effort normal and breath sounds normal. He has no wheezes. He has no rales.  Skin: Skin is warm and dry.          Assessment & Plan:  URI:  Cough, congestion, rhinorrhea x 2 days. Exam today with clear lungs - no suspicion for pneumonia/COPD exacerbation, bronchitis. Appears well. Suspect viral involvement and will treat with conservative measures. Mucinex DM, Zyrtec, Flonase, fluids, rest. Return precautions provided  Sheral Flow, NP

## 2017-03-01 ENCOUNTER — Telehealth: Payer: Self-pay

## 2017-03-01 DIAGNOSIS — J069 Acute upper respiratory infection, unspecified: Secondary | ICD-10-CM

## 2017-03-01 MED ORDER — AZITHROMYCIN 250 MG PO TABS: ORAL_TABLET | ORAL | 0 refills | Status: DC

## 2017-03-01 MED ORDER — ALBUTEROL SULFATE HFA 108 (90 BASE) MCG/ACT IN AERS: 2.0000 | INHALATION_SPRAY | RESPIRATORY_TRACT | 0 refills | Status: DC | PRN

## 2017-03-01 NOTE — Telephone Encounter (Signed)
Pt was seen 02/23/17 and now nasal discharge has worsened, prod cough with green phlegm which is thick,chest congestion and ? Fever. pt having slight wheezing.Pt request abx and an inhaler to [facility].Please advise.

## 2017-03-01 NOTE — Telephone Encounter (Signed)
Agree that antibiotics are warranted. Start Azithromycin antibiotics. Take 2 tablets by mouth today, then 1 tablet daily for 4 additional days. Shortness of Breath/Wheezing/Cough: Use the albuterol inhaler. Inhale 2 puffs into the lungs every 6 to 8 hours as needed for wheezing and/or shortness of breath.

## 2017-03-02 NOTE — Telephone Encounter (Signed)
Spoken and notified patient of Kate's comments. Patient verbalized understanding. 

## 2017-03-03 ENCOUNTER — Other Ambulatory Visit: Payer: Self-pay | Admitting: Primary Care

## 2017-03-03 DIAGNOSIS — E039 Hypothyroidism, unspecified: Secondary | ICD-10-CM

## 2017-04-03 ENCOUNTER — Other Ambulatory Visit: Payer: Self-pay | Admitting: Primary Care

## 2017-04-03 DIAGNOSIS — E039 Hypothyroidism, unspecified: Secondary | ICD-10-CM

## 2017-04-05 ENCOUNTER — Encounter: Payer: Self-pay | Admitting: Nurse Practitioner

## 2017-04-05 ENCOUNTER — Ambulatory Visit (INDEPENDENT_AMBULATORY_CARE_PROVIDER_SITE_OTHER): Payer: BLUE CROSS/BLUE SHIELD | Admitting: Nurse Practitioner

## 2017-04-05 VITALS — BP 154/78 | HR 87 | Temp 98.2°F | Ht 70.0 in | Wt 298.0 lb

## 2017-04-05 DIAGNOSIS — L03115 Cellulitis of right lower limb: Secondary | ICD-10-CM

## 2017-04-05 DIAGNOSIS — S81811A Laceration without foreign body, right lower leg, initial encounter: Secondary | ICD-10-CM | POA: Diagnosis not present

## 2017-04-05 MED ORDER — MUPIROCIN 2 % EX OINT: 1.0000 "application " | TOPICAL_OINTMENT | Freq: Two times a day (BID) | CUTANEOUS | 0 refills | Status: AC

## 2017-04-05 MED ORDER — DOXYCYCLINE HYCLATE 100 MG PO TABS: 100.0000 mg | ORAL_TABLET | Freq: Two times a day (BID) | ORAL | 0 refills | Status: AC

## 2017-04-05 NOTE — Progress Notes (Signed)
Subjective:  Patient ID: [name], male    DOB: 1950/10/10  Age: 66 y.o. MRN: [id number]  CC: Leg Injury (right leg injure, cut, red,swelling and sore. going on 1 wk. )   Wound Check  He was originally treated 5 to 10 days ago. Previous treatment included wound cleansing or irrigation. His temperature was unmeasured prior to arrival. There has been colored discharge from the wound. There is new redness present. There is new swelling present. The pain has worsened. He has no difficulty moving the affected extremity or digit.  laceration from [facility] equipment. Last tetanus 2016.  Outpatient Medications Prior to Visit  Medication Sig Dispense Refill  . albuterol (PROVENTIL HFA;VENTOLIN HFA) 108 (90 Base) MCG/ACT inhaler Inhale 2 puffs into the lungs every 4 (four) hours as needed for wheezing or shortness of breath. 1 Inhaler 0  . aspirin EC 81 MG tablet Take 81 mg by mouth daily.    . fenofibrate (TRICOR) 145 MG tablet Take 145 mg by mouth daily.    . fluticasone (FLONASE) 50 MCG/ACT nasal spray Place 1 spray into both nostrils 2 (two) times daily as needed for allergies or rhinitis. 16 g 0  . hydrochlorothiazide (HYDRODIURIL) 50 MG tablet TAKE 1 TABLET BY MOUTH DAILY 90 tablet 3  . Insulin Glargine (LANTUS SOLOSTAR) 100 UNIT/ML Solostar Pen Inject 10 Units into the skin daily at 10 pm. Increase by 2 units daily until fasting is 130 in am. (Patient taking differently: Inject 55-75 units into the skin once a day) 5 pen PRN  . levothyroxine (SYNTHROID, LEVOTHROID) 25 MCG tablet TAKE 1 TABLET BY MOUTH EVERY MORNING 30 MINUTES BEFORE BREAKFAST. 30 tablet 5  . metFORMIN (GLUCOPHAGE) 1000 MG tablet TAKE 1 TABLET BY MOUTH TWICE A DAY 180 tablet 1  . ramipril (ALTACE) 10 MG capsule TAKE ONE (1) CAPSULE BY MOUTH 2 TIMES DAILY 180 capsule 3  . ranitidine (ZANTAC) 150 MG tablet Take 150 mg by mouth 2 (two) times daily.    Marland Kitchen azithromycin (ZITHROMAX) 250 MG tablet Take 2 tablets by mouth today, then 1  tablet daily for 4 additional days. 6 tablet 0  . liraglutide (VICTOZA) 18 MG/3ML SOPN Inject 1.8 mg into the skin daily.      No facility-administered medications prior to visit.     ROS See HPI  Objective:  BP (!) 154/78   Pulse 87   Temp 98.2 F (36.8 C)   Ht 5\' 10"  (1.778 m)   Wt 298 lb (135.2 kg)   SpO2 98%   BMI 42.76 kg/m   BP Readings from Last 3 Encounters:  04/05/17 (!) 154/78  02/23/17 (!) 146/76  11/08/16 (!) 164/92    Wt Readings from Last 3 Encounters:  04/05/17 298 lb (135.2 kg)  02/23/17 295 lb 6.4 oz (134 kg)  11/08/16 284 lb 12.8 oz (129.2 kg)    Physical Exam  Constitutional: He is oriented to person, place, and time.  Cardiovascular: Normal rate.   Pulmonary/Chest: Effort normal.  Musculoskeletal: He exhibits edema and tenderness. He exhibits no deformity.       Right knee: Normal.       Right ankle: Normal.       Legs:      Right foot: Normal.  Normal distal pulses  Neurological: He is alert and oriented to person, place, and time.  Skin: Skin is warm and dry. There is erythema.  Psychiatric: He has a normal mood and affect. His behavior is normal.  Vitals  reviewed.   Lab Results  Component Value Date   WBC 8.1 02/19/2015   HGB 14.2 02/19/2015   HCT 41.3 02/19/2015   PLT 269 02/19/2015   GLUCOSE 102 (H) 06/01/2016   CHOL 152 06/01/2016   TRIG 106.0 06/01/2016   HDL 33.70 (L) 06/01/2016   LDLCALC 97 06/01/2016   ALT 43 06/01/2016   AST 33 06/01/2016   NA 142 06/01/2016   K 4.4 06/01/2016   CL 102 06/01/2016   CREATININE 1.07 06/01/2016   BUN 15 06/01/2016   CO2 32 06/01/2016   TSH 3.37 08/07/2016   PSA 0.78 06/01/2016   HGBA1C 7.5 (H) 06/01/2016   MICROALBUR 1.1 06/01/2016    Ct Chest Lung Cancer Screening Low Dose Wo Contrast  Result Date: 06/28/2016 CLINICAL DATA:  Former smoker, quit 2 years ago, 68 pack-year history, lung cancer screening. EXAM: CT CHEST WITHOUT CONTRAST LOW-DOSE FOR LUNG CANCER SCREENING TECHNIQUE:  Multidetector CT imaging of the chest was performed following the standard protocol without IV contrast. COMPARISON:  None. FINDINGS: Cardiovascular: Atherosclerotic calcification of the arterial vasculature, including coronary arteries. Heart size normal. No pericardial effusion. Mediastinum/Nodes: Mediastinal lymph nodes are not enlarged by CT size criteria. Hilar regions are difficult to definitively evaluate without IV contrast. No axillary adenopathy. Esophagus is grossly unremarkable. Lungs/Pleura: Mild to moderate centrilobular emphysema with scattered paraseptal emphysema. No pulmonary nodules. Probable calcified granuloma in the posterior right upper lobe. No pleural fluid. Airway is unremarkable. Upper Abdomen: Visualized portion of the liver is decreased in attenuation diffusely. Visualized portions of the adrenal glands, spleen and stomach are grossly unremarkable. Musculoskeletal: No worrisome lytic or sclerotic lesions. Degenerative changes are seen in the spine. Flowing anterior osteophytosis is noted as well. IMPRESSION: 1. Lung-RADS Category 1, negative. Continue annual screening with low-dose chest CT without contrast in 12 months. 2. Aortic atherosclerosis (ICD10-170.0). Coronary artery calcification. 3.  Emphysema (ICD10-J43.9). 4. Hepatic steatosis. Electronically Signed   By: Lorin Picket M.D.   On: 06/28/2016 10:36    Assessment & Plan:   Yanixan was seen today for leg injury.  Diagnoses and all orders for this visit:  Lower leg laceration with complication, right, initial encounter -     mupirocin ointment (BACTROBAN) 2 %; Apply 1 application topically 2 (two) times daily. Apply with dressing change -     doxycycline (VIBRA-TABS) 100 MG tablet; Take 1 tablet (100 mg total) by mouth 2 (two) times daily.  Cellulitis of right lower extremity -     mupirocin ointment (BACTROBAN) 2 %; Apply 1 application topically 2 (two) times daily. Apply with dressing change -     doxycycline  (VIBRA-TABS) 100 MG tablet; Take 1 tablet (100 mg total) by mouth 2 (two) times daily.   I have discontinued Mr. Jacque's azithromycin. I am also having him start on mupirocin ointment and doxycycline. Additionally, I am having him maintain his Insulin Glargine, metFORMIN, aspirin EC, fenofibrate, ranitidine, liraglutide, ramipril, hydrochlorothiazide, fluticasone, albuterol, and levothyroxine.  Meds ordered this encounter  Medications  . mupirocin ointment (BACTROBAN) 2 %    Sig: Apply 1 application topically 2 (two) times daily. Apply with dressing change    Dispense:  8 g    Refill:  0    Order Specific Question:   Supervising Provider    Answer:   Cassandria Anger [[id number]]  . doxycycline (VIBRA-TABS) 100 MG tablet    Sig: Take 1 tablet (100 mg total) by mouth 2 (two) times daily.    Dispense:  14 tablet    Refill:  0    Order Specific Question:   Supervising Provider    Answer:   Cassandria Anger [[id number]]    Follow-up: Return if symptoms worsen or fail to improve.  Wilfred Lacy, NP

## 2017-04-05 NOTE — Patient Instructions (Signed)
Clean wound with antibacterial soap and water 1-2times a day.  Leave wound open as much as possible when at home.  Keep wound clean and dry as much as possible.  Avoid soaking leg in water.  Elevate leg as much as possible.  Return to office if no improvement in 1week.

## 2017-04-17 ENCOUNTER — Ambulatory Visit (INDEPENDENT_AMBULATORY_CARE_PROVIDER_SITE_OTHER): Payer: BLUE CROSS/BLUE SHIELD | Admitting: Primary Care

## 2017-04-17 ENCOUNTER — Encounter: Payer: Self-pay | Admitting: Primary Care

## 2017-04-17 VITALS — BP 148/72 | HR 88 | Temp 98.0°F | Ht 70.0 in | Wt 300.0 lb

## 2017-04-17 DIAGNOSIS — L97912 Non-pressure chronic ulcer of unspecified part of right lower leg with fat layer exposed: Secondary | ICD-10-CM | POA: Diagnosis not present

## 2017-04-17 NOTE — Progress Notes (Signed)
Subjective:    Patient ID: [name], male    DOB: Aug 09, 1950, 66 y.o.   MRN: [id number]  HPI  [name] is a 66 year old male with a history of type 2 diabetes, essential hypertension who presents today for follow up of lower extremity laceration. 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 He was treated 1 week prior to this visit with wound irrigation at home. During his visit on 04/05/17 he endorsed new redness, swelling, and pain. His wound was determined to be infectious so he was treated with topical Bactroban ointment and oral doxycycline antibiotics.  Since treatment with antibiotics he's noticed some improvement in the surrounding erythema but not significant improvement. He's noticed increased discomfort around the wound and increased swelling to his right foot. He's been applying the Bactroban ointment and keeping the wound covered. He denies fevers, re-injury, weakness.   Review of Systems  Constitutional: Negative for fever.  Skin: Positive for color change.  Neurological: Negative for weakness.       Past Medical History:  Diagnosis Date  . Arthritis    knee  . Bronchitis   . Colon polyp 2008  . Diabetes mellitus   . Hypertension      Social History   Social History  . Marital status: Married    Spouse name: N/A  . Number of children: N/A  . Years of education: N/A   Occupational History  . Not on file.   Social History Main Topics  . Smoking status: Former Smoker    Packs/day: 2.00    Years: 35.00    Types: Cigarettes, E-cigarettes    Quit date: 12/11/2014  . Smokeless tobacco: Never Used  . Alcohol use No  . Drug use: No  . Sexual activity: Not on file   Other Topics Concern  . Not on file   Social History Narrative   Married.   3 children. 2 grandchildren.   Retired. He once worked as a Administrator.   Enjoys working on projects around [employer].     Past Surgical History:  Procedure Laterality Date  . COLONOSCOPY  2008, 2016   Dr Bary Castilla  . DERMATOFIBROMA  03/26/15   Procedure: Excision mass left leg;  Surgeon: Robert Bellow, MD;  Location: [facility] ORS;  Service: General;  Laterality: N/A;  . LIPOMA EXCISION N/A 03/26/2015      . TONSILLECTOMY      Family History  Problem Relation Age of Onset  . Stroke Mother   . Diabetes Mother   . Stroke Father   . Diabetes Other   . Other Daughter 25       precancerous colon polyp   . Diabetes Maternal Grandmother   . Diabetes Paternal Grandmother     Allergies  Allergen Reactions  . Atorvastatin     Other reaction(s): Headache  . Other Other (See Comments)    STATIN DRUG-CAUSED MUSCLE PAIN/LETHARGIC    Current Outpatient Prescriptions on File Prior to Visit  Medication Sig Dispense Refill  . albuterol (PROVENTIL HFA;VENTOLIN HFA) 108 (90 Base) MCG/ACT inhaler Inhale 2 puffs into the lungs every 4 (four) hours as needed for wheezing or shortness of breath. 1 Inhaler 0  . aspirin EC 81 MG tablet Take 81 mg by mouth daily.    . fenofibrate (TRICOR) 145 MG tablet Take 145 mg by mouth daily.    . fluticasone (  FLONASE) 50 MCG/ACT nasal spray Place 1 spray into both nostrils 2 (two) times daily as needed for allergies or rhinitis. 16 g 0  . hydrochlorothiazide (HYDRODIURIL) 50 MG tablet TAKE 1 TABLET BY MOUTH DAILY 90 tablet 3  . Insulin Glargine (LANTUS SOLOSTAR) 100 UNIT/ML Solostar Pen Inject 10 Units into the skin daily at 10 pm. Increase by 2 units daily until fasting is 130 in am. (Patient taking differently: Inject 55-75 units into the skin once a day) 5 pen PRN  . levothyroxine (SYNTHROID, LEVOTHROID) 25 MCG tablet TAKE 1 TABLET BY MOUTH EVERY MORNING 30 MINUTES BEFORE BREAKFAST. 30 tablet 5  . liraglutide (VICTOZA) 18 MG/3ML SOPN Inject 1.8 mg into the skin daily.     . metFORMIN (GLUCOPHAGE) 1000 MG tablet TAKE 1 TABLET BY MOUTH TWICE A DAY 180 tablet 1  . ramipril  (ALTACE) 10 MG capsule TAKE ONE (1) CAPSULE BY MOUTH 2 TIMES DAILY 180 capsule 3  . ranitidine (ZANTAC) 150 MG tablet Take 150 mg by mouth 2 (two) times daily.     No current facility-administered medications on file prior to visit.     BP (!) 148/72   Pulse 88   Temp 98 F (36.7 C) (Oral)   Ht 5\' 10"  (1.778 m)   Wt 300 lb (136.1 kg)   SpO2 96%   BMI 43.05 kg/m    Objective:   Physical Exam  Constitutional: He appears well-nourished.  Neck: Neck supple.  Cardiovascular: Normal rate.   Pulses:      Dorsalis pedis pulses are 2+ on the right side.       Posterior tibial pulses are 2+ on the right side.  Skin: There is erythema.  Mild erythema surrounding ulcer. Ulcer measuring 3.5 cm x 1 cm.             Assessment & Plan:  Lower Extremity Ulcer:  Treated for cellulitis of lower extremity 12 days ago, completed Doxycycline course. Exam today consistent for venous stasis ulcer. Given history of diabetes and hypertension, will send to wound clinic for treatment. Discussed wet to dry dressings until evaluated. Referral placed for wound clinic. Does not appear infectious. Dressing applied today.  Sheral Flow, NP

## 2017-04-17 NOTE — Patient Instructions (Signed)
Stop by the front desk and speak with either Rosaria Ferries or Robin regarding your referral to the Wound Clinic.  Keep the wound covered for protection.  It was a pleasure to see you today!

## 2017-04-23 ENCOUNTER — Encounter: Payer: Self-pay | Admitting: Podiatry

## 2017-04-23 ENCOUNTER — Ambulatory Visit (INDEPENDENT_AMBULATORY_CARE_PROVIDER_SITE_OTHER): Payer: BLUE CROSS/BLUE SHIELD | Admitting: Podiatry

## 2017-04-23 DIAGNOSIS — E1149 Type 2 diabetes mellitus with other diabetic neurological complication: Secondary | ICD-10-CM

## 2017-04-23 DIAGNOSIS — B351 Tinea unguium: Secondary | ICD-10-CM

## 2017-04-23 DIAGNOSIS — Q828 Other specified congenital malformations of skin: Secondary | ICD-10-CM | POA: Diagnosis not present

## 2017-04-23 NOTE — Progress Notes (Signed)
Complaint:  Visit Type: Patient returns to my office for continued preventative foot care services. Complaint: Patient states" my nails have grown long and thick and become painful to walk and wear shoes" Patient has been diagnosed with DM with no foot complications. The patient presents for preventative foot care services. No changes to ROS.  Painful callus right foot.  Podiatric Exam: Vascular: dorsalis pedis and posterior tibial pulses are palpable bilateral. Capillary return is immediate. Temperature gradient is WNL. Skin turgor WNL  Sensorium: Diminished  Semmes Weinstein monofilament test. Normal tactile sensation bilaterally. Nail Exam: Pt has thick disfigured discolored nails with subungual debris noted bilateral entire nail hallux through fifth toenails Ulcer Exam: There is no evidence of ulcer or pre-ulcerative changes or infection. Orthopedic Exam: Muscle tone and strength are WNL. No limitations in general ROM. No crepitus or effusions noted. Hallux malleus and hammer toes  B/L. Skin: Porokeratosis sub3,5 right foot. No infection or ulcers  Diagnosis:  Onychomycosis, , Pain in right toe, pain in left toes, Porokeratosis right foot  Treatment & Plan Procedures and Treatment: Consent by patient was obtained for treatment procedures. The patient understood the discussion of treatment and procedures well. All questions were answered thoroughly reviewed. Debridement of mycotic and hypertrophic toenails, 1 through 5 bilateral and clearing of subungual debris. No ulceration, no infection noted. Debride porokeratosis right foot using # 15 Blade. Return Visit-Office Procedure: Patient instructed to return to the office for a follow up visit 3 months for continued evaluation and treatment.    Gardiner Barefoot DPM

## 2017-04-27 ENCOUNTER — Encounter: Payer: BLUE CROSS/BLUE SHIELD | Attending: Surgery | Admitting: Surgery

## 2017-04-27 DIAGNOSIS — Z794 Long term (current) use of insulin: Secondary | ICD-10-CM | POA: Diagnosis not present

## 2017-04-27 DIAGNOSIS — M1990 Unspecified osteoarthritis, unspecified site: Secondary | ICD-10-CM | POA: Insufficient documentation

## 2017-04-27 DIAGNOSIS — I10 Essential (primary) hypertension: Secondary | ICD-10-CM | POA: Insufficient documentation

## 2017-04-27 DIAGNOSIS — Z79899 Other long term (current) drug therapy: Secondary | ICD-10-CM | POA: Diagnosis not present

## 2017-04-27 DIAGNOSIS — E11622 Type 2 diabetes mellitus with other skin ulcer: Secondary | ICD-10-CM | POA: Insufficient documentation

## 2017-04-27 DIAGNOSIS — X58XXXA Exposure to other specified factors, initial encounter: Secondary | ICD-10-CM | POA: Diagnosis not present

## 2017-04-27 DIAGNOSIS — Z7982 Long term (current) use of aspirin: Secondary | ICD-10-CM | POA: Insufficient documentation

## 2017-04-27 DIAGNOSIS — L97212 Non-pressure chronic ulcer of right calf with fat layer exposed: Secondary | ICD-10-CM | POA: Diagnosis not present

## 2017-04-27 DIAGNOSIS — S81811A Laceration without foreign body, right lower leg, initial encounter: Secondary | ICD-10-CM | POA: Diagnosis not present

## 2017-04-27 DIAGNOSIS — Z87891 Personal history of nicotine dependence: Secondary | ICD-10-CM | POA: Diagnosis not present

## 2017-04-27 DIAGNOSIS — Z6841 Body Mass Index (BMI) 40.0 and over, adult: Secondary | ICD-10-CM | POA: Insufficient documentation

## 2017-04-29 NOTE — Progress Notes (Signed)
DILON, LANK ([id number]) Visit Report for 04/27/2017 Abuse/Suicide Risk Screen Details Patient Name: BRUIN, BOLGER. Date of Service: 04/27/2017 8:00 AM Medical Record Number: [id number] Patient Account Number: [id number] Date of Birth/Sex: Apr 21, 1951 (66 y.o. Male) Treating RN: Montey Hora Primary Care Jemmie Rhinehart: Alma Friendly Other Clinician: Referring Shadoe Cryan: Alma Friendly Treating Brittini Brubeck/Extender: Frann Rider in Treatment: 0 Abuse/Suicide Risk Screen Items Answer ABUSE/SUICIDE RISK SCREEN: Has anyone close to you tried to hurt or harm you recentlyo No Do you feel uncomfortable with anyone in your familyo No Has anyone forced you do things that you didnot want to doo No Do you have any thoughts of harming yourselfo No Patient displays signs or symptoms of abuse and/or neglect. No Electronic Signature(s) Signed: 04/27/2017 4:21:56 PM By: Montey Hora Entered By: Montey Hora on 04/27/2017 08:15:13 [name] ([id number]) -------------------------------------------------------------------------------- Activities of Daily Living Details Patient Name: SATORU, MILICH. Date of Service: 04/27/2017 8:00 AM Medical Record Number: [id number] Patient Account Number: [id number] Date of Birth/Sex: [birth year] (66 y.o. Male) Treating RN: Montey Hora Primary Care Kage Willmann: Alma Friendly Other Clinician: Referring Shamell Hittle: Alma Friendly Treating Reeta Kuk/Extender: Frann Rider in Treatment: 0 Activities of Daily Living Items Answer Activities of Daily Living (Please select one for each item) Drive Automobile Completely Able Take Medications Completely Able Use Telephone Completely Able Care for Appearance Completely Able Use Toilet Completely Able Bath / Shower Completely Able Dress Self Completely Able Feed Self Completely Able Walk Completely Able Get In / Out Bed Completely Able Housework Completely Able Prepare Meals Completely  [place] for Self Completely Able Electronic Signature(s) Signed: 04/27/2017 4:21:56 PM By: Montey Hora Entered By: Montey Hora on 04/27/2017 08:15:36 [name] ([id number]) -------------------------------------------------------------------------------- Education Assessment Details Patient Name: [name]. Date of Service: 04/27/2017 8:00 AM Medical Record Number: [id number] Patient Account Number: [id number] Date of Birth/Sex: [birth year] (66 y.o. Male) Treating RN: Montey Hora Primary Care Selma Rodelo: Alma Friendly Other Clinician: Referring Claudie Brickhouse: Alma Friendly Treating Jeral Zick/Extender: Frann Rider in Treatment: 0 Primary Learner Assessed: Patient Learning Preferences/Education Level/Primary Language Learning Preference: Explanation, Demonstration Highest Education Level: High School Preferred Language: English Cognitive Barrier Assessment/Beliefs Language Barrier: No Translator Needed: No Memory Deficit: No Emotional Barrier: No Cultural/Religious Beliefs Affecting Medical No Care: Physical Barrier Assessment Impaired Vision: No Impaired Hearing: No Decreased Hand dexterity: No Knowledge/Comprehension Assessment Knowledge Level: Medium Comprehension Level: Medium Ability to understand written Medium instructions: Ability to understand verbal Medium instructions: Motivation Assessment Anxiety Level: Calm Cooperation: Cooperative Education Importance: Acknowledges Need Interest in Health Problems: Asks Questions Perception: Coherent Willingness to Engage in Self- Medium Management Activities: Readiness to Engage in Self- Medium Management Activities: Electronic Signature(s) MACDONALD, RIGOR ([id number]) Signed: 04/27/2017 4:21:56 PM By: Montey Hora Entered By: Montey Hora on 04/27/2017 08:16:03 DAYMEN, HASSEBROCK  ([id number]) -------------------------------------------------------------------------------- Fall Risk Assessment Details Patient Name: [name]. Date of Service: 04/27/2017 8:00 AM Medical Record Number: [id number] Patient Account Number: [id number] Date of Birth/Sex: [birth year] (66 y.o. Male) Treating RN: Montey Hora Primary Care [place] Shannahan: Alma Friendly Other Clinician: Referring Aluel Schwarz: Alma Friendly Treating Cortlyn Cannell/Extender: Frann Rider in Treatment: 0 Fall Risk Assessment Items Have you had 2 or more falls in the last 12 monthso 0 No Have you had any fall that resulted in injury in the last 12 monthso 0 No FALL RISK ASSESSMENT: History of falling - immediate or within 3 months 0 No Secondary diagnosis 0 No Ambulatory aid None/bed rest/wheelchair/nurse 0 No Crutches/cane/walker 0 No Furniture 0  No IV Access/Saline Lock 0 No Gait/Training Normal/bed rest/immobile 0 Yes Weak 0 No Impaired 0 No Mental Status Oriented to own ability 0 Yes Electronic Signature(s) Signed: 04/27/2017 4:21:56 PM By: Montey Hora Entered By: Montey Hora on 04/27/2017 08:16:17 [name] ([id number]) -------------------------------------------------------------------------------- Foot Assessment Details Patient Name: [name]. Date of Service: 04/27/2017 8:00 AM Medical Record Number: [id number] Patient Account Number: [id number] Date of Birth/Sex: 09-16-1950 (66 y.o. Male) Treating RN: Montey Hora Primary Care Tavonte Seybold: Alma Friendly Other Clinician: Referring Laurine Kuyper: Alma Friendly Treating Lil Lepage/Extender: Frann Rider in Treatment: 0 Foot Assessment Items Site Locations + = Sensation present, - = Sensation absent, C = Callus, U = Ulcer R = Redness, W = Warmth, M = Maceration, PU = Pre-ulcerative lesion F = Fissure, S = Swelling, D = Dryness Assessment Right: Left: Other Deformity: No No Prior Foot Ulcer: No No Prior  Amputation: No No Charcot Joint: No No Ambulatory Status: Ambulatory Without Help Gait: Steady Electronic Signature(s) Signed: 04/27/2017 10:03:09 AM By: Montey Hora Entered By: Montey Hora on 04/27/2017 10:03:09 [name] ([id number]) -------------------------------------------------------------------------------- Nutrition Risk Assessment Details Patient Name: [name]. Date of Service: 04/27/2017 8:00 AM Medical Record Number: [id number] Patient Account Number: [id number] Date of Birth/Sex: [birth year] (66 y.o. Male) Treating RN: Montey Hora Primary Care Myles Tavella: Alma Friendly Other Clinician: Referring Ahlijah Raia: Alma Friendly Treating Francenia Chimenti/Extender: Frann Rider in Treatment: 0 Height (in): Weight (lbs): Body Mass Index (BMI): Nutrition Risk Assessment Items NUTRITION RISK SCREEN: I have an illness or condition that made me change the kind and/or 0 No amount of food I eat I eat fewer than two meals per day 0 No I eat few fruits and vegetables, or milk products 0 No I have three or more drinks of beer, liquor or wine almost every day 0 No I have tooth or mouth problems that make it hard for me to eat 0 No I don't always have enough money to buy the food I need 0 No I eat alone most of the time 0 No I take three or more different prescribed or over-the-counter drugs a 1 Yes day Without wanting to, I have lost or gained 10 pounds in the last six 0 No months I am not always physically able to shop, cook and/or feed myself 0 No Nutrition Protocols Good Risk Protocol Moderate Risk Protocol Electronic Signature(s) Signed: 04/27/2017 4:21:56 PM By: Montey Hora Entered By: Montey Hora on 04/27/2017 90:24:09

## 2017-04-29 NOTE — Progress Notes (Signed)
RASHARD, RYLE ([id number]) Visit Report for 04/27/2017 Allergy List Details Patient Name: [name], [name]. Date of Service: 04/27/2017 8:00 AM Medical Record Number: [id number] Patient Account Number: [id number] Date of Birth/Sex: [birth year] (66 y.o. Male) Treating RN: Montey Hora Primary Care Honey Zakarian: Alma Friendly Other Clinician: Referring Deklyn Gibbon: Alma Friendly Treating Lili Harts/Extender: Frann Rider in Treatment: 0 Allergies Active Allergies Statins-Hmg-Coa Reductase Inhibitors Allergy Notes Electronic Signature(s) Signed: 04/27/2017 4:21:56 PM By: Montey Hora Entered By: Montey Hora on 04/27/2017 08:15:02 [name] ([id number]) -------------------------------------------------------------------------------- Arrival Information Details Patient Name: [name], [name]. Date of Service: 04/27/2017 8:00 AM Medical Record Number: [id number] Patient Account Number: [id number] Date of Birth/Sex: April 02, 1951 (66 y.o. Male) Treating RN: Montey Hora Primary Care Ceasar Decandia: Alma Friendly Other Clinician: Referring Ashok Sawaya: Alma Friendly Treating Aditya Nastasi/Extender: Frann Rider in Treatment: 0 Visit Information Patient Arrived: Ambulatory Arrival Time: 08:12 Accompanied By: spouse Transfer Assistance: None Patient Identification Verified: Yes Secondary Verification Process Yes Completed: Patient Has Alerts: Yes Patient Alerts: DMII Electronic Signature(s) Signed: 04/27/2017 4:21:56 PM By: Montey Hora Entered By: Montey Hora on 04/27/2017 08:12:57 [name] ([id number]) -------------------------------------------------------------------------------- Clinic Level of Care Assessment Details Patient Name: [name]. Date of Service: 04/27/2017 8:00 AM Medical Record Number: [id number] Patient Account Number: [id number] Date of Birth/Sex: 09-19-1950 (66 y.o. Male) Treating RN: Montey Hora Primary Care Emmy Keng: Alma Friendly Other Clinician: Referring Jameson Tormey: Alma Friendly Treating Tessia Kassin/Extender: Frann Rider in Treatment: 0 Clinic Level of Care Assessment Items TOOL 1 Quantity Score []  - Use when EandM and Procedure is performed on INITIAL visit 0 ASSESSMENTS - Nursing Assessment / Reassessment X - General Physical Exam (combine w/ comprehensive assessment (listed just 1 20 below) when performed on new pt. evals) X - Comprehensive Assessment (HX, ROS, Risk Assessments, Wounds Hx, etc.) 1 25 ASSESSMENTS - Wound and Skin Assessment / Reassessment []  - Dermatologic / Skin Assessment (not related to wound area) 0 ASSESSMENTS - Ostomy and/or Continence Assessment and Care []  - Incontinence Assessment and Management 0 []  - Ostomy Care Assessment and Management (repouching, etc.) 0 PROCESS - Coordination of Care X - Simple Patient / Family Education for ongoing care 1 15 []  - Complex (extensive) Patient / Family Education for ongoing care 0 []  - Staff obtains Programmer, systems, Records, Test Results / Process Orders 0 []  - Staff telephones HHA, Nursing Homes / Clarify orders / etc 0 []  - Routine Transfer to another Facility (non-emergent condition) 0 []  - Routine Hospital Admission (non-emergent condition) 0 X - New Admissions / Biomedical engineer / Ordering NPWT, Apligraf, etc. 1 15 []  - Emergency Hospital Admission (emergent condition) 0 PROCESS - Special Needs []  - Pediatric / Minor Patient Management 0 []  - Isolation Patient Management 0 [name], [name] ([id number]) []  - Hearing / Language / Visual special needs 0 []  - Assessment of Community assistance (transportation, D/C planning, etc.) 0 []  - Additional assistance / Altered mentation 0 []  - Support Surface(s) Assessment (bed, cushion, seat, etc.) 0 INTERVENTIONS - Miscellaneous []  - External ear exam 0 []  - Patient Transfer (multiple staff / Civil Service fast streamer / Similar devices) 0 []  - Simple Staple / Suture removal (25 or less)  0 []  - Complex Staple / Suture removal (26 or more) 0 []  - Hypo/Hyperglycemic Management (do not check if billed separately) 0 X - Ankle / Brachial Index (ABI) - do not check if billed separately 1 15 Has the patient been seen at the hospital within the last three years: Yes Total Score: 90 Level  Of Care: New/Established - Level 3 Electronic Signature(s) Signed: 04/27/2017 4:21:56 PM By: Montey Hora Entered By: Montey Hora on 04/27/2017 10:04:52 [name] ([id number]) -------------------------------------------------------------------------------- Encounter Discharge Information Details Patient Name: [name], [name]. Date of Service: 04/27/2017 8:00 AM Medical Record Number: [id number] Patient Account Number: [id number] Date of Birth/Sex: [birth year] (66 y.o. Male) Treating RN: Montey Hora Primary Care Baylen Buckner: Alma Friendly Other Clinician: Referring Ehtan Delfavero: Alma Friendly Treating Bethel Gaglio/Extender: Frann Rider in Treatment: 0 Encounter Discharge Information Items Discharge Pain Level: 0 Discharge Condition: Stable Ambulatory Status: Ambulatory Discharge Destination: Home Transportation: Private Auto Accompanied By: spouse Schedule Follow-up Appointment: Yes Medication Reconciliation completed and provided to Patient/Care No Kaniya Trueheart: Provided on Clinical Summary of Care: 04/27/2017 Form Type Recipient Paper Patient JB Electronic Signature(s) Signed: 04/27/2017 10:05:49 AM By: Montey Hora Entered By: Montey Hora on 04/27/2017 10:05:49 [name] ([id number]) -------------------------------------------------------------------------------- Lower Extremity Assessment Details Patient Name: [name]. Date of Service: 04/27/2017 8:00 AM Medical Record Number: [id number] Patient Account Number: [id number] Date of Birth/Sex: 1950/11/27 (66 y.o. Male) Treating RN: Montey Hora Primary Care Ona Roehrs: Alma Friendly Other  Clinician: Referring Karee Christopherson: Alma Friendly Treating Antonya Leeder/Extender: Frann Rider in Treatment: 0 Edema Assessment Assessed: [Left: No] [Right: No] Edema: [Left: Yes] [Right: Yes] Calf Left: Right: Point of Measurement: 34 cm From Medial Instep 41.6 cm 41 cm Ankle Left: Right: Point of Measurement: 12 cm From Medial Instep 26.2 cm 26.5 cm Vascular Assessment Pulses: Dorsalis Pedis Palpable: [Left:Yes] [Right:Yes] Doppler Audible: [Left:Yes] [Right:Yes] Posterior Tibial Palpable: [Left:Yes] [Right:Yes] Doppler Audible: [Left:Yes] [Right:Yes] Extremity colors, hair growth, and conditions: Extremity Color: [Left:Normal] [Right:Normal] Hair Growth on Extremity: [Left:No] [Right:No] Temperature of Extremity: [Left:Warm] [Right:Warm] Capillary Refill: [Left:< 3 seconds] [Right:< 3 seconds] Blood Pressure: Brachial: [Left:118] [Right:116] Dorsalis Pedis: 140 [Left:Dorsalis Pedis: 425] Ankle: Posterior Tibial: 144 [Left:Posterior Tibial: 130 1.22] [Right:1.12] Toe Nail Assessment Left: Right: Thick: Yes Yes Discolored: Yes Yes Deformed: No No Improper Length and Hygiene: No No [name], [name] ([id number]) Electronic Signature(s) Signed: 04/27/2017 4:21:56 PM By: Montey Hora Entered By: Montey Hora on 04/27/2017 08:48:01 [name] ([id number]) -------------------------------------------------------------------------------- Multi Wound Chart Details Patient Name: [name]. Date of Service: 04/27/2017 8:00 AM Medical Record Number: [id number] Patient Account Number: [id number] Date of Birth/Sex: August 05, 1950 (66 y.o. Male) Treating RN: Montey Hora Primary Care Roark Rufo: Alma Friendly Other Clinician: Referring Magdalene Tardiff: Alma Friendly Treating Dayln Tugwell/Extender: Frann Rider in Treatment: 0 Vital Signs Height(in): 70 Pulse(bpm): 82 Weight(lbs): 302 Blood Pressure 119/62 (mmHg): Body Mass Index(BMI): 43 Temperature(F):  98.0 Respiratory Rate 18 (breaths/min): Photos: [1:No Photos] [N/A:N/A] Wound Location: [1:Right Lower Leg - Posterior] [N/A:N/A] Wounding Event: [1:Trauma] [N/A:N/A] Primary Etiology: [1:Trauma, Other] [N/A:N/A] Comorbid History: [1:Hypertension, Type II Diabetes, Osteoarthritis] [N/A:N/A] Date Acquired: [1:04/06/2017] [N/A:N/A] Weeks of Treatment: [1:0] [N/A:N/A] Wound Status: [1:Open] [N/A:N/A] Measurements L x W x D 2.2x1.5x0.1 [N/A:N/A] (cm) Area (cm) : [1:2.592] [N/A:N/A] Volume (cm) : [1:0.259] [N/A:N/A] Classification: [1:Full Thickness Without Exposed Support Structures] [N/A:N/A] Exudate Amount: [1:Large] [N/A:N/A] Exudate Type: [1:Serous] [N/A:N/A] Exudate Color: [1:amber] [N/A:N/A] Wound Margin: [1:Flat and Intact] [N/A:N/A] Granulation Amount: [1:Small (1-33%)] [N/A:N/A] Granulation Quality: [1:Pink] [N/A:N/A] Necrotic Amount: [1:Large (67-100%)] [N/A:N/A] Necrotic Tissue: [1:Eschar, Adherent Slough] [N/A:N/A] Exposed Structures: [1:Fat Layer (Subcutaneous Tissue) Exposed: Yes Fascia: No Tendon: No Muscle: No] [N/A:N/A] Joint: No Bone: No Epithelialization: None N/A N/A Debridement: Debridement (01093- N/A N/A 11047) Pre-procedure 09:14 N/A N/A Verification/Time Out Taken: Pain Control: Lidocaine 4% Topical N/A N/A Solution Tissue Debrided: Necrotic/Eschar, N/A N/A Fibrin/Slough, Subcutaneous Level: Skin/Subcutaneous N/A N/A Tissue  Debridement Area (sq 3.3 N/A N/A cm): Instrument: Curette N/A N/A Bleeding: Minimum N/A N/A Hemostasis Achieved: Pressure N/A N/A Procedural Pain: 0 N/A N/A Post Procedural Pain: 0 N/A N/A Debridement Treatment Procedure was tolerated N/A N/A Response: well Post Debridement 2.2x1.5x0.2 N/A N/A Measurements L x W x D (cm) Post Debridement 0.518 N/A N/A Volume: (cm) Periwound Skin Texture: Excoriation: No N/A N/A Induration: No Callus: No Crepitus: No Rash: No Scarring: No Periwound Skin Maceration: No N/A  N/A Moisture: Dry/Scaly: No Periwound Skin Color: Atrophie Blanche: No N/A N/A Cyanosis: No Ecchymosis: No Erythema: No Hemosiderin Staining: No Mottled: No Pallor: No Rubor: No Temperature: No Abnormality N/A N/A Tenderness on Yes N/A N/A Palpation: Wound Preparation: Ulcer Cleansing: N/A N/A Rinsed/Irrigated with DERRIS, MILLAN ([id number]) Saline Topical Anesthetic Applied: Other: lidocaine 4% Procedures Performed: Debridement N/A N/A Treatment Notes Electronic Signature(s) Signed: 04/27/2017 10:04:33 AM By: Montey Hora Previous Signature: 04/27/2017 9:40:31 AM Version By: Christin Fudge MD, FACS Previous Signature: 04/27/2017 9:40:25 AM Version By: Christin Fudge MD, FACS Entered By: Montey Hora on 04/27/2017 10:04:32 [name] ([id number]) -------------------------------------------------------------------------------- [place] Details Patient Name: [name], [name]. Date of Service: 04/27/2017 8:00 AM Medical Record Number: [id number] Patient Account Number: [id number] Date of Birth/Sex: [birth year] (66 y.o. Male) Treating RN: Montey Hora Primary Care Trystin Terhune: Alma Friendly Other Clinician: Referring Matej Sappenfield: Alma Friendly Treating Pranit Owensby/Extender: Frann Rider in Treatment: 0 Active Inactive ` Nutrition Nursing Diagnoses: Impaired glucose control: actual or potential Goals: Patient/caregiver verbalizes understanding of need to maintain therapeutic glucose control per primary care physician Date Initiated: 04/27/2017 Target Resolution Date: 07/04/2017 Goal Status: Active Interventions: Assess HgA1c results as ordered upon admission and as needed Provide education on elevated blood sugars and impact on wound healing Notes: ` Orientation to the Wound Care Program Nursing Diagnoses: Knowledge deficit related to the wound healing center program Goals: Patient/caregiver will verbalize understanding of the [facility] Date Initiated: 04/27/2017 Target Resolution Date: 07/20/2017 Goal Status: Active Interventions: Provide education on orientation to the wound center Notes: ` Wound/Skin Impairment Nursing Diagnoses: [name], [name] ([id number]) Impaired tissue integrity Goals: Ulcer/skin breakdown will heal within 14 weeks Date Initiated: 04/27/2017 Target Resolution Date: 07/20/2017 Goal Status: Active Interventions: Assess patient/caregiver ability to obtain necessary supplies Assess patient/caregiver ability to perform ulcer/skin care regimen upon admission and as needed Assess ulceration(s) every visit Notes: Electronic Signature(s) Signed: 04/27/2017 10:04:17 AM By: Montey Hora Entered By: Montey Hora on 04/27/2017 10:04:16 [name] ([id number]) -------------------------------------------------------------------------------- Pain Assessment Details Patient Name: [name]. Date of Service: 04/27/2017 8:00 AM Medical Record Number: [id number] Patient Account Number: [id number] Date of Birth/Sex: 19-Jun-1951 (66 y.o. Male) Treating RN: Montey Hora Primary Care Yatziry Deakins: Alma Friendly Other Clinician: Referring Kashlyn Salinas: Alma Friendly Treating Ha Placeres/Extender: Frann Rider in Treatment: 0 Active Problems Location of Pain Severity and Description of Pain Patient Has Paino No Site Locations Pain Management and Medication Current Pain Management: Notes Topical or injectable lidocaine is offered to patient for acute pain when surgical debridement is performed. If needed, Patient is instructed to use over the counter pain medication for the following 24-48 hours after debridement. Wound care MDs do not prescribed pain medications. Patient has chronic pain or uncontrolled pain. Patient has been instructed to make an appointment with their Primary Care Physician for pain management. Electronic Signature(s) Signed: 04/27/2017 4:21:56 PM  By: Montey Hora Entered By: Montey Hora on 04/27/2017 08:14:21 [name] ([id number]) -------------------------------------------------------------------------------- Patient/Caregiver Education Details Patient Name: Juan Baumgarten  M. Date of Service: 04/27/2017 8:00 AM Medical Record Number: [id number] Patient Account Number: [id number] Date of Birth/Gender: 26-Jan-1951 (66 y.o. Male) Treating RN: Montey Hora Primary Care Physician: Alma Friendly Other Clinician: Referring Physician: Alma Friendly Treating Physician/Extender: Frann Rider in Treatment: 0 Education Assessment Education Provided To: Patient and Caregiver Education Topics Provided Wound/Skin Impairment: Handouts: Other: wound care as ordered Methods: Demonstration, Explain/Verbal Responses: State content correctly Electronic Signature(s) Signed: 04/27/2017 4:21:56 PM By: Montey Hora Entered By: Montey Hora on 04/27/2017 10:06:08 [name] ([id number]) -------------------------------------------------------------------------------- Wound Assessment Details Patient Name: [name]. Date of Service: 04/27/2017 8:00 AM Medical Record Number: [id number] Patient Account Number: [id number] Date of Birth/Sex: 08/28/1950 (65 y.o. Male) Treating RN: Montey Hora Primary Care Shirelle Tootle: Alma Friendly Other Clinician: Referring Ruey Storer: Alma Friendly Treating Fate Galanti/Extender: Frann Rider in Treatment: 0 Wound Status Wound Number: 1 Primary Trauma, Other Etiology: Wound Location: Right Lower Leg - Posterior Wound Status: Open Wounding Event: Trauma Comorbid Hypertension, Type II Diabetes, Date Acquired: 04/06/2017 History: Osteoarthritis Weeks Of Treatment: 0 Clustered Wound: No Photos Photo Uploaded By: Montey Hora on 04/27/2017 14:44:28 Wound Measurements Length: (cm) 2.2 Width: (cm) 1.5 Depth: (cm) 0.1 Area: (cm) 2.592 Volume: (cm) 0.259 %  Reduction in Area: % Reduction in Volume: Epithelialization: None Tunneling: No Undermining: No Wound Description Full Thickness Without Exposed Foul Odor After Classification: Support Structures Slough/Fibrino Wound Margin: Flat and Intact Exudate Large Amount: Exudate Type: Serous Exudate Color: amber Cleansing: No Yes Wound Bed Granulation Amount: Small (1-33%) Exposed Structure Granulation Quality: Pink Fascia Exposed: No Necrotic Amount: Large (67-100%) Fat Layer (Subcutaneous Tissue) Exposed: Yes [name], [name] ([id number]) Necrotic Quality: Eschar, Adherent Slough Tendon Exposed: No Muscle Exposed: No Joint Exposed: No Bone Exposed: No Periwound Skin Texture Texture Color No Abnormalities Noted: No No Abnormalities Noted: No Callus: No Atrophie Blanche: No Crepitus: No Cyanosis: No Excoriation: No Ecchymosis: No Induration: No Erythema: No Rash: No Hemosiderin Staining: No Scarring: No Mottled: No Pallor: No Moisture Rubor: No No Abnormalities Noted: No Dry / Scaly: No Temperature / Pain Maceration: No Temperature: No Abnormality Tenderness on Palpation: Yes Wound Preparation Ulcer Cleansing: Rinsed/Irrigated with Saline Topical Anesthetic Applied: Other: lidocaine 4%, Treatment Notes Wound #1 (Right, Posterior Lower Leg) 1. Cleansed with: Clean wound with Normal Saline 2. Anesthetic Topical Lidocaine 4% cream to wound bed prior to debridement 4. Dressing Applied: Santyl Ointment 5. Secondary Dressing Applied Dry [place] Signature(s) Signed: 04/27/2017 4:21:56 PM By: Montey Hora Entered By: Montey Hora on 04/27/2017 08:35:59 [name] ([id number]) -------------------------------------------------------------------------------- [place] Details Patient Name: [name]. Date of Service: 04/27/2017 8:00 AM Medical Record Number: [id number] Patient Account Number: [id number] Date of Birth/Sex: 10-Oct-1950  (66 y.o. Male) Treating RN: Montey Hora Primary Care Arsal Tappan: Alma Friendly Other Clinician: Referring Jefrey Raburn: Alma Friendly Treating Sallie Staron/Extender: Frann Rider in Treatment: 0 Vital Signs Time Taken: 08:25 Temperature (F): 98.0 Height (in): 70 Pulse (bpm): 82 Source: Measured Respiratory Rate (breaths/min): 18 Weight (lbs): 302 Blood Pressure (mmHg): 119/62 Source: Measured Reference Range: 80 - 120 mg / dl Body Mass Index (BMI): 43.3 Electronic Signature(s) Signed: 04/27/2017 4:21:56 PM By: Montey Hora Entered By: Montey Hora on 04/27/2017 08:26:39

## 2017-04-29 NOTE — Progress Notes (Signed)
IZAN, MIRON ([id number]) Visit Report for 04/27/2017 Chief Complaint Document Details Patient Name: [name], [name]. Date of Service: 04/27/2017 8:00 AM Medical Record Number: [id number] Patient Account Number: [id number] Date of Birth/Sex: 1951/02/07 (66 y.o. Male) Treating RN: Montey Hora Primary Care Provider: Alma Friendly Other Clinician: Referring Provider: Alma Friendly Treating Provider/Extender: Frann Rider in Treatment: 0 Information Obtained from: Patient Chief Complaint Patient seen for complaints of Non-Healing Wound to the right calf which he sustained about 3 weeks ago Electronic Signature(s) Signed: 04/27/2017 9:41:02 AM By: Christin Fudge MD, FACS Entered By: Christin Fudge on 04/27/2017 09:41:01 [name] ([id number]) -------------------------------------------------------------------------------- Debridement Details Patient Name: [name]. Date of Service: 04/27/2017 8:00 AM Medical Record Number: [id number] Patient Account Number: [id number] Date of Birth/Sex: 1950/10/24 (66 y.o. Male) Treating RN: Montey Hora Primary Care Provider: Alma Friendly Other Clinician: Referring Provider: Alma Friendly Treating Provider/Extender: Frann Rider in Treatment: 0 Debridement Performed for Wound #1 Right,Posterior Lower Leg Assessment: Performed By: Physician Christin Fudge, MD Debridement: Debridement Pre-procedure Verification/Time Out Yes - 09:14 Taken: Start Time: 09:14 Pain Control: Lidocaine 4% Topical Solution Level: Skin/Subcutaneous Tissue Total Area Debrided (L x 2.2 (cm) x 1.5 (cm) = 3.3 (cm) W): Tissue and other Viable, Non-Viable, Eschar, Fibrin/Slough, Subcutaneous material debrided: Instrument: Curette Bleeding: Minimum Hemostasis Achieved: Pressure End Time: 09:18 Procedural Pain: 0 Post Procedural Pain: 0 Response to Treatment: Procedure was tolerated well Post Debridement Measurements of Total  Wound Length: (cm) 2.2 Width: (cm) 1.5 Depth: (cm) 0.2 Volume: (cm) 0.518 Character of Wound/Ulcer Post Improved Debridement: Post Procedure Diagnosis Same as Pre-procedure Electronic Signature(s) Signed: 04/27/2017 9:40:40 AM By: Christin Fudge MD, FACS Signed: 04/27/2017 4:21:56 PM By: Montey Hora Entered By: Christin Fudge on 04/27/2017 09:40:40 [name] ([id number]) -------------------------------------------------------------------------------- HPI Details Patient Name: [name]. Date of Service: 04/27/2017 8:00 AM Medical Record Number: [id number] Patient Account Number: [id number] Date of Birth/Sex: 1950-09-05 (66 y.o. Male) Treating RN: Montey Hora Primary Care Provider: Alma Friendly Other Clinician: Referring Provider: Alma Friendly Treating Provider/Extender: Frann Rider in Treatment: 0 History of Present Illness Location: right calf Quality: Patient reports experiencing a dull pain to affected area(s). Severity: Patient states wound are getting worse. Duration: Patient has had the wound for < 4 weeks prior to presenting for treatment Timing: Pain in wound is Intermittent (comes and goes Context: The wound occurred when the patient had a laceration with a tractor Modifying Factors: Other treatment(s) tried include:oral antibiotics and Bactroban ointment Associated Signs and Symptoms: Patient reports having increase discharge. HPI Description: 66 year old male with a history of diabetes mellitus type 2 has been seeing his PCP for lower extremity laceration and was initially seen on September 20. 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 The nurse practitioner at the practice was concerned about her venous stasis ulcer and referred him to the wound center. He is a former smoker who quit in 2016 and has a past medical history of arthritis,  bronchitis, colon polyps, diabetes mellitus and hypertension Last hemoglobin A1c about a year ago was 7.5%. He says he's had a more recent hemoglobin A1c done and it was about 7.2% Electronic Signature(s) Signed: 04/27/2017 9:42:39 AM By: Christin Fudge MD, FACS Previous Signature: 04/27/2017 8:49:11 AM Version By: Christin Fudge MD, FACS Entered By: Christin Fudge on 04/27/2017 09:42:39 [name], [name] ([id number]) -------------------------------------------------------------------------------- Physical Exam Details Patient Name: [name], [name]. Date of  Service: 04/27/2017 8:00 AM Medical Record Number: [id number] Patient Account Number: [id number] Date of Birth/Sex: 12-28-1950 (66 y.o. Male) Treating RN: Montey Hora Primary Care Provider: Alma Friendly Other Clinician: Referring Provider: Alma Friendly Treating Provider/Extender: Frann Rider in Treatment: 0 Constitutional . Pulse regular. Respirations normal and unlabored. Afebrile. . Eyes Nonicteric. Reactive to light. Ears, Nose, Mouth, and Throat Lips, teeth, and gums WNL.Marland Kitchen Moist mucosa without lesions. Neck supple and nontender. No palpable supraclavicular or cervical adenopathy. Normal sized without goiter. Respiratory WNL. No retractions.. Cardiovascular Pedal Pulses WNL. ABI on the left was 1.22 in the right was 1.12. No clubbing, cyanosis or edema. Chest Breasts symmetical and no nipple discharge.. Breast tissue WNL, no masses, lumps, or tenderness.. Gastrointestinal (GI) Abdomen without masses or tenderness.. No liver or spleen enlargement or tenderness.. Lymphatic No adneopathy. No adenopathy. No adenopathy. Musculoskeletal Adexa without tenderness or enlargement.. Digits and nails w/o clubbing, cyanosis, infection, petechiae, ischemia, or inflammatory conditions.. Integumentary (Hair, Skin) No suspicious lesions. No crepitus or fluctuance. No peri-wound warmth or erythema. No  masses.Marland Kitchen Psychiatric Judgement and insight Intact.. No evidence of depression, anxiety, or agitation.. Notes he has a lacerated wound on the right medial calf which has some slough and a scar sharply remove this with a #3 curet and minimal bleeding controlled with pressure Electronic Signature(s) Signed: 04/27/2017 9:51:22 AM By: Christin Fudge MD, FACS Entered By: Christin Fudge on 04/27/2017 09:51:21 [name] ([id number]) -------------------------------------------------------------------------------- Physician Orders Details Patient Name: [name]. Date of Service: 04/27/2017 8:00 AM Medical Record Number: [id number] Patient Account Number: [id number] Date of Birth/Sex: [birth year] (66 y.o. Male) Treating RN: Montey Hora Primary Care Provider: Alma Friendly Other Clinician: Referring Provider: Alma Friendly Treating Provider/Extender: Frann Rider in Treatment: 0 Verbal / Phone Orders: No Diagnosis Coding Wound Cleansing Wound #1 Right,Posterior Lower Leg o Clean wound with Normal Saline. o May Shower, gently pat wound dry prior to applying new dressing. Anesthetic Wound #1 Right,Posterior Lower Leg o Topical Lidocaine 4% cream applied to wound bed prior to debridement Primary Wound Dressing Wound #1 Right,Posterior Lower Leg o Santyl Ointment Secondary Dressing Wound #1 Right,Posterior Lower Leg o Boardered Foam Dressing - or telfa island dressing Dressing Change Frequency Wound #1 Right,Posterior Lower Leg o Change dressing every day. Follow-up Appointments Wound #1 Right,Posterior Lower Leg o Return Appointment in 1 week. Edema Control Wound #1 Right,Posterior Lower Leg o Elevate legs to the level of the heart and pump ankles as often as possible Additional Orders / Instructions Wound #1 Right,Posterior Lower Leg o Increase protein intake. o Other: - Try to keep blood sugars below 180 and please add vitamin A, vitamin C  and zinc supplements to your diet [name], [name] ([id number]) Medications-please add to medication list. Wound #1 Right,Posterior Lower Leg o Santyl Enzymatic Ointment Patient Medications Allergies: Statins-Hmg-Coa Reductase Inhibitors Notifications Medication Indication Start End Santyl 04/27/2017 DOSE topical 250 unit/gram ointment - ointment topical as directed Electronic Signature(s) Signed: 04/27/2017 9:24:25 AM By: Christin Fudge MD, FACS Entered By: Christin Fudge on 04/27/2017 09:24:24 [name] ([id number]) -------------------------------------------------------------------------------- Problem List Details Patient Name: [name], [name]. Date of Service: 04/27/2017 8:00 AM Medical Record Number: [id number] Patient Account Number: [id number] Date of Birth/Sex: Nov 12, 1950 (66 y.o. Male) Treating RN: Montey Hora Primary Care Provider: Alma Friendly Other Clinician: Referring Provider: Alma Friendly Treating Provider/Extender: Frann Rider in Treatment: 0 Active Problems ICD-10 Encounter Code Description Active Date Diagnosis E11.622 Type 2 diabetes mellitus with other skin ulcer 04/27/2017 Yes S81.811A Laceration  without foreign body, right lower leg, initial 04/27/2017 Yes encounter L97.212 Non-pressure chronic ulcer of right calf with fat layer 04/27/2017 Yes exposed E66.01 Morbid (severe) obesity due to excess calories 04/27/2017 Yes Inactive Problems Resolved Problems Electronic Signature(s) Signed: 04/27/2017 9:40:18 AM By: Christin Fudge MD, FACS Entered By: Christin Fudge on 04/27/2017 09:40:17 [name] ([id number]) -------------------------------------------------------------------------------- Progress Note Details Patient Name: [name]. Date of Service: 04/27/2017 8:00 AM Medical Record Number: [id number] Patient Account Number: [id number] Date of Birth/Sex: Jun 07, 1951 (66 y.o. Male) Treating RN: Montey Hora Primary Care  Provider: Alma Friendly Other Clinician: Referring Provider: Alma Friendly Treating Provider/Extender: Frann Rider in Treatment: 0 Subjective Chief Complaint Information obtained from Patient Patient seen for complaints of Non-Healing Wound to the right calf which he sustained about 3 weeks ago History of Present Illness (HPI) The following HPI elements were documented for the patient's wound: Location: right calf Quality: Patient reports experiencing a dull pain to affected area(s). Severity: Patient states wound are getting worse. Duration: Patient has had the wound for < 4 weeks prior to presenting for treatment Timing: Pain in wound is Intermittent (comes and goes Context: The wound occurred when the patient had a laceration with a tractor Modifying Factors: Other treatment(s) tried include:oral antibiotics and Bactroban ointment Associated Signs and Symptoms: Patient reports having increase discharge. 66 year old male with a history of diabetes mellitus type 2 has been seeing his PCP for lower extremity laceration and was initially seen on September 20. 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 The nurse practitioner at the practice was concerned about her venous stasis ulcer and referred him to the wound center. He is a former smoker who quit in 2016 and has a past medical history of arthritis, bronchitis, colon polyps, diabetes mellitus and hypertension Last hemoglobin A1c about a year ago was 7.5%. He says he's had a more recent hemoglobin A1c done and it was about 7.2% Wound History Patient presents with 1 open wound that has been present for approximately 3 weeks. Patient has been treating wound in the following manner: dry dressing. Laboratory tests have not been performed in the last month. Patient reportedly has not tested positive for an antibiotic resistant organism.  Patient reportedly has not tested positive for osteomyelitis. Patient reportedly has not had testing performed to evaluate circulation in the legs. Patient experiences the following problems associated with their wounds: infection. Patient History Information obtained from Patient. Allergies Statins-Hmg-Coa Reductase Inhibitors [name], [name] ([id number]) Family History Diabetes - Mother, Stroke - Mother,Father, No family history of Cancer, Heart Disease, Hereditary Spherocytosis, Hypertension, Kidney Disease, Lung Disease, Seizures, Thyroid Problems, Tuberculosis. Social History Former smoker - 2+ years ago, Marital Status - Married, Alcohol Use - Never, Drug Use - No History, Caffeine Use - Daily. Medical History Cardiovascular Patient has history of Hypertension Endocrine Patient has history of Type II Diabetes Musculoskeletal Patient has history of Osteoarthritis Patient is treated with Insulin. Blood sugar is tested. Medical And Surgical History Notes Respiratory bronchitis Review of Systems (ROS) Constitutional Symptoms (General Health) The patient has no complaints or symptoms. Eyes Complains or has symptoms of Glasses / Contacts - glasses. Ear/Nose/Mouth/Throat The patient has no complaints or symptoms. Hematologic/Lymphatic The patient has no complaints or symptoms. Cardiovascular The patient has no complaints or symptoms. Gastrointestinal The patient has no complaints or symptoms. Genitourinary The patient has no complaints or symptoms. Immunological The patient has  no complaints or symptoms. Integumentary (Skin) The patient has no complaints or symptoms. Musculoskeletal The patient has no complaints or symptoms. Neurologic The patient has no complaints or symptoms. Oncologic The patient has no complaints or symptoms. Psychiatric The patient has no complaints or symptoms. [name], [name] ([id number]) Medications Victoza 3-Pak 0.6 mg/0.1 mL (18 mg/3 mL)  subcutaneous pen injector subcutaneous pen injector subcutaneous up to 100 units total three times daily metformin 1,000 mg tablet oral 1 1 tablet oral two times daily ramipril 10 mg capsule oral 1 1 capsule oral two times daily ranitidine 150 mg tablet oral 1 1 tablet oral two times daily Lantus Solostar U-100 Insulin 100 unit/mL (3 mL) subcutaneous pen subcutaneous insulin pen subcutaneous 58 units nightly Tricor 145 mg tablet oral 1 1 tablet oral daily aspirin 81 mg tablet,delayed release oral 1 1 tablet,delayed release (DR/EC) oral daily hydrochlorothiazide 50 mg tablet oral 1 1 tablet oral daily levothyroxine 25 mcg tablet oral tablet oral daily Santyl 250 unit/gram topical ointment topical ointment topical as directed Objective Constitutional Pulse regular. Respirations normal and unlabored. Afebrile. Vitals Time Taken: 8:25 AM, Height: 70 in, Source: Measured, Weight: 302 lbs, Source: Measured, BMI: 43.3, Temperature: 98.0 F, Pulse: 82 bpm, Respiratory Rate: 18 breaths/min, Blood Pressure: 119/62 mmHg. Eyes Nonicteric. Reactive to light. Ears, Nose, Mouth, and Throat Lips, teeth, and gums WNL.Marland Kitchen Moist mucosa without lesions. Neck supple and nontender. No palpable supraclavicular or cervical adenopathy. Normal sized without goiter. Respiratory WNL. No retractions.. Cardiovascular Pedal Pulses WNL. ABI on the left was 1.22 in the right was 1.12. No clubbing, cyanosis or edema. BENJIE, RICKETSON ([id number]) Chest Breasts symmetical and no nipple discharge.. Breast tissue WNL, no masses, lumps, or tenderness.. Gastrointestinal (GI) Abdomen without masses or tenderness.. No liver or spleen enlargement or tenderness.. Lymphatic No adneopathy. No adenopathy. No adenopathy. Musculoskeletal Adexa without tenderness or enlargement.. Digits and nails w/o clubbing, cyanosis, infection, petechiae, ischemia, or inflammatory conditions.Marland Kitchen Psychiatric Judgement and insight Intact.. No  evidence of depression, anxiety, or agitation.. General Notes: he has a lacerated wound on the right medial calf which has some slough and a scar sharply remove this with a #3 curet and minimal bleeding controlled with pressure Integumentary (Hair, Skin) No suspicious lesions. No crepitus or fluctuance. No peri-wound warmth or erythema. No masses.. Wound #1 status is Open. Original cause of wound was Trauma. The wound is located on the Right,Posterior Lower Leg. The wound measures 2.2cm length x 1.5cm width x 0.1cm depth; 2.592cm^2 area and 0.259cm^3 volume. There is Fat Layer (Subcutaneous Tissue) Exposed exposed. There is no tunneling or undermining noted. There is a large amount of serous drainage noted. The wound margin is flat and intact. There is small (1-33%) pink granulation within the wound bed. There is a large (67-100%) amount of necrotic tissue within the wound bed including Eschar and Adherent Slough. The periwound skin appearance did not exhibit: Callus, Crepitus, Excoriation, Induration, Rash, Scarring, Dry/Scaly, Maceration, Atrophie Blanche, Cyanosis, Ecchymosis, Hemosiderin Staining, Mottled, Pallor, Rubor, Erythema. Periwound temperature was noted as No Abnormality. The periwound has tenderness on palpation. Assessment Active Problems ICD-10 E11.622 - Type 2 diabetes mellitus with other skin ulcer S81.811A - Laceration without foreign body, right lower leg, initial encounter L97.212 - Non-pressure chronic ulcer of right calf with fat layer exposed E66.01 - Morbid (severe) obesity due to excess calories [name], [name] ([id number]) 66 year old with a lacerated wound on his right calf which needed some sharp debridement and after review I have recommended: 1. Santyl  ointment locally to be applied daily after washing with soap and water 2. Good control of his diabetes mellitus 3. Adequate protein, vitamin A, vitamin C and zinc 4. Regular physician wound  center Procedures Wound #1 Pre-procedure diagnosis of Wound #1 is a Trauma, Other located on the Right,Posterior Lower Leg . There was a Skin/Subcutaneous Tissue Debridement ([id number]) debridement with total area of 3.3 sq cm performed by Christin Fudge, MD. with the following instrument(s): Curette to remove Viable and Non-Viable tissue/material including Fibrin/Slough, Eschar, and Subcutaneous after achieving pain control using Lidocaine 4% Topical Solution. A time out was conducted at 09:14, prior to the start of the procedure. A Minimum amount of bleeding was controlled with Pressure. The procedure was tolerated well with a pain level of 0 throughout and a pain level of 0 following the procedure. Post Debridement Measurements: 2.2cm length x 1.5cm width x 0.2cm depth; 0.518cm^3 volume. Character of Wound/Ulcer Post Debridement is improved. Post procedure Diagnosis Wound #1: Same as Pre-Procedure Plan Wound Cleansing: Wound #1 Right,Posterior Lower Leg: Clean wound with Normal Saline. May Shower, gently pat wound dry prior to applying new dressing. Anesthetic: Wound #1 Right,Posterior Lower Leg: Topical Lidocaine 4% cream applied to wound bed prior to debridement Primary Wound Dressing: Wound #1 Right,Posterior Lower Leg: Santyl Ointment Secondary Dressing: Wound #1 Right,Posterior Lower Leg: Boardered Foam Dressing - or telfa island dressing Dressing Change Frequency: Wound #1 Right,Posterior Lower Leg: Change dressing every day. Follow-up Appointments: Wound #1 Right,Posterior Lower Leg: Return Appointment in 1 week. Edema Control: Wound #1 Right,Posterior Lower Leg: [name], [name]. ([id number]) Elevate legs to the level of the heart and pump ankles as often as possible Additional Orders / Instructions: Wound #1 Right,Posterior Lower Leg: Increase protein intake. Other: - Try to keep blood sugars below 180 and please add vitamin A, vitamin C and zinc supplements to your  diet Medications-please add to medication list.: Wound #1 Right,Posterior Lower Leg: Santyl Enzymatic Ointment The following medication(s) was prescribed: Santyl topical 250 unit/gram ointment ointment topical as directed starting 04/27/2017 66 year old with a lacerated wound on his right calf which needed some sharp debridement and after review I have recommended: 1. Santyl ointment locally to be applied daily after washing with soap and water 2. Good control of his diabetes mellitus 3. Adequate protein, vitamin A, vitamin C and zinc 4. Regular physician wound center Electronic Signature(s) Signed: 04/27/2017 9:52:43 AM By: Christin Fudge MD, FACS Entered By: Christin Fudge on 04/27/2017 09:52:43 [name], [name] ([id number]) -------------------------------------------------------------------------------- ROS/PFSH Details Patient Name: [name]. Date of Service: 04/27/2017 8:00 AM Medical Record Number: [id number] Patient Account Number: [id number] Date of Birth/Sex: [birth year] (66 y.o. Male) Treating RN: Montey Hora Primary Care Provider: Alma Friendly Other Clinician: Referring Provider: Alma Friendly Treating Provider/Extender: Frann Rider in Treatment: 0 Information Obtained From Patient Wound History Do you currently have one or more open woundso Yes How many open wounds do you currently haveo 1 Approximately how long have you had your woundso 3 weeks How have you been treating your wound(s) until nowo dry dressing Has your wound(s) ever healed and then re-openedo No Have you had any lab work done in the past montho No Have you tested positive for an antibiotic resistant organism (MRSA, VRE)o No Have you tested positive for osteomyelitis (bone infection)o No Have you had any tests for circulation on your legso No Have you had other problems associated with your woundso Infection Eyes Complaints and Symptoms: Positive for: Glasses / Contacts -  glasses  Constitutional Symptoms (General Health) Complaints and Symptoms: No Complaints or Symptoms Ear/Nose/Mouth/Throat Complaints and Symptoms: No Complaints or Symptoms Hematologic/Lymphatic Complaints and Symptoms: No Complaints or Symptoms Respiratory Medical History: Past Medical History Notes: bronchitis Cardiovascular KEMP, GOMES ([id number]) Complaints and Symptoms: No Complaints or Symptoms Medical History: Positive for: Hypertension Gastrointestinal Complaints and Symptoms: No Complaints or Symptoms Endocrine Medical History: Positive for: Type II Diabetes Time with diabetes: since 1997 Treated with: Insulin Blood sugar tested every day: Yes Tested : Genitourinary Complaints and Symptoms: No Complaints or Symptoms Immunological Complaints and Symptoms: No Complaints or Symptoms Integumentary (Skin) Complaints and Symptoms: No Complaints or Symptoms Musculoskeletal Complaints and Symptoms: No Complaints or Symptoms Medical History: Positive for: Osteoarthritis Neurologic Complaints and Symptoms: No Complaints or Symptoms Oncologic Complaints and Symptoms: No Complaints or Symptoms AERIK, POLAN ([id number]) Psychiatric Complaints and Symptoms: No Complaints or Symptoms Immunizations Pneumococcal Vaccine: Received Pneumococcal Vaccination: Yes Immunization Notes: up to date Implantable Devices Family and Social History Cancer: No; Diabetes: Yes - Mother; Heart Disease: No; Hereditary Spherocytosis: No; Hypertension: No; Kidney Disease: No; Lung Disease: No; Seizures: No; Stroke: Yes - Mother,Father; Thyroid Problems: No; Tuberculosis: No; Former smoker - 2+ years ago; Marital Status - Married; Alcohol Use: Never; Drug Use: No History; Caffeine Use: Daily; Financial Concerns: No; Food, Clothing or Shelter Needs: No; Support System Lacking: No; Transportation Concerns: No; Advanced Directives: No; Patient does not want information on  Advanced Directives Physician Affirmation I have reviewed and agree with the above information. Electronic Signature(s) Signed: 04/27/2017 4:18:43 PM By: Christin Fudge MD, FACS Signed: 04/27/2017 4:21:56 PM By: Montey Hora Previous Signature: 04/27/2017 8:31:58 AM Version By: Christin Fudge MD, FACS Entered By: Christin Fudge on 04/27/2017 08:44:40 TIMOFEY, CARANDANG ([id number]) -------------------------------------------------------------------------------- SuperBill Details Patient Name: MAXMILIAN, TROSTEL. Date of Service: 04/27/2017 Medical Record Number: [id number] Patient Account Number: [id number] Date of Birth/Sex: [birth year] (66 y.o. Male) Treating RN: Montey Hora Primary Care Provider: Alma Friendly Other Clinician: Referring Provider: Alma Friendly Treating Provider/Extender: Frann Rider in Treatment: 0 Diagnosis Coding ICD-10 Codes Code Description [phone] Type 2 diabetes mellitus with other skin ulcer S81.811A Laceration without foreign body, right lower leg, initial encounter L97.212 Non-pressure chronic ulcer of right calf with fat layer exposed E66.01 Morbid (severe) obesity due to excess calories Facility Procedures CPT4 Code Description: [id number] 99213 - WOUND CARE VISIT-LEV 3 EST PT Modifier: Quantity: 1 CPT4 Code Description: 53614431 11042 - DEB SUBQ TISSUE 20 SQ CM/< ICD-10 Description Diagnosis E11.622 Type 2 diabetes mellitus with other skin ulcer S81.811A Laceration without foreign body, right lower leg, init 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 Modifier: ial encounte r exposed Quantity: 1 r Physician Procedures CPT4 Code Description: 5400867 61950 - WC PHYS LEVEL 4 - NEW PT ICD-10 Description Diagnosis E11.622 Type 2 diabetes mellitus with other skin ulcer S81.811A Laceration without foreign body, right lower leg, init 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 E66.01 Morbid (severe) obesity due to  excess calories Modifier: 25 ial encounte r exposed Quantity: 1 r CPT4 Code Description: [id number] 45809 - WC PHYS SUBQ TISS 20 SQ CM ICD-10 Description Diagnosis E11.622 Type 2 diabetes mellitus with other skin ulcer S81.811A Laceration without foreign body, right lower leg, init 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

## 2017-05-03 ENCOUNTER — Encounter: Payer: BLUE CROSS/BLUE SHIELD | Admitting: Surgery

## 2017-05-03 DIAGNOSIS — E11622 Type 2 diabetes mellitus with other skin ulcer: Secondary | ICD-10-CM | POA: Diagnosis not present

## 2017-05-04 NOTE — Progress Notes (Signed)
ARIAS, WEINERT ([id number]) Visit Report for 05/03/2017 Arrival Information Details Patient Name: [name], [name]. Date of Service: 05/03/2017 9:15 AM Medical Record Number: [id number] Patient Account Number: [id number] Date of Birth/Sex: 26-Jul-1950 (66 y.o. Male) Treating RN: Montey Hora Primary Care Delaine Canter: Alma Friendly Other Clinician: Referring Legrand Lasser: Alma Friendly Treating Thinh Cuccaro/Extender: Frann Rider in Treatment: 0 Visit Information History Since Last Visit Added or deleted any medications: No Patient Arrived: Ambulatory Any new allergies or adverse reactions: No Arrival Time: 09:25 Had a fall or experienced change in No Accompanied By: spouse activities of daily living that may affect Transfer Assistance: None risk of falls: Patient Identification Verified: Yes Signs or symptoms of abuse/neglect since last No Secondary Verification Process Yes visito Completed: Hospitalized since last visit: No Patient Has Alerts: Yes Has Dressing in Place as Prescribed: Yes Patient Alerts: DMII Pain Present Now: No Electronic Signature(s) Signed: 05/03/2017 3:15:59 PM By: Montey Hora Entered By: Montey Hora on 05/03/2017 09:27:34 [name] ([id number]) -------------------------------------------------------------------------------- Encounter Discharge Information Details Patient Name: [name]. Date of Service: 05/03/2017 9:15 AM Medical Record Number: [id number] Patient Account Number: [id number] Date of Birth/Sex: [birth year] (66 y.o. Male) Treating RN: Montey Hora Primary Care Marysa Wessner: Alma Friendly Other Clinician: Referring Caterin Tabares: Alma Friendly Treating Breyon Sigg/Extender: Frann Rider in Treatment: 0 Encounter Discharge Information Items Discharge Pain Level: 0 Discharge Condition: Stable Ambulatory Status: Ambulatory Discharge Destination: Home Transportation: Private Auto Accompanied By: spouse Schedule  Follow-up Appointment: Yes Medication Reconciliation completed and provided to Patient/Care No [name]: Provided on Clinical Summary of Care: 05/03/2017 Form Type Recipient Paper Patient JB Electronic Signature(s) Signed: 05/03/2017 2:41:43 PM By: Montey Hora Entered By: Montey Hora on 05/03/2017 14:41:42 [name] ([id number]) -------------------------------------------------------------------------------- Lower Extremity Assessment Details Patient Name: [name]. Date of Service: 05/03/2017 9:15 AM Medical Record Number: [id number] Patient Account Number: [id number] Date of Birth/Sex: Jun 20, 1951 (66 y.o. Male) Treating RN: Montey Hora Primary Care Nioka Thorington: Alma Friendly Other Clinician: Referring Granite Godman: Alma Friendly Treating Anamae Rochelle/Extender: Frann Rider in Treatment: 0 Vascular Assessment Pulses: Dorsalis Pedis Palpable: [Right:Yes] Posterior Tibial Extremity colors, hair growth, and conditions: Extremity Color: [Right:Normal] Hair Growth on Extremity: [Right:Yes] Temperature of Extremity: [Right:Warm] Capillary Refill: [Right:< 3 seconds] Electronic Signature(s) Signed: 05/03/2017 3:15:59 PM By: Montey Hora Entered By: Montey Hora on 05/03/2017 09:34:15 [name] ([id number]) -------------------------------------------------------------------------------- Multi Wound Chart Details Patient Name: [name]. Date of Service: 05/03/2017 9:15 AM Medical Record Number: [id number] Patient Account Number: [id number] Date of Birth/Sex: 07/09/1951 (66 y.o. Male) Treating RN: Montey Hora Primary Care Dametria Tuzzolino: Alma Friendly Other Clinician: Referring Anique Beckley: Alma Friendly Treating Kabao Leite/Extender: Frann Rider in Treatment: 0 Vital Signs Height(in): 70 Pulse(bpm): 87 Weight(lbs): 302 Blood Pressure 151/72 (mmHg): Body Mass Index(BMI): 43 Temperature(F): 97.9 Respiratory  Rate 18 (breaths/min): Photos: [1:No Photos] [N/A:N/A] Wound Location: [1:Right Lower Leg - Posterior] [N/A:N/A] Wounding Event: [1:Trauma] [N/A:N/A] Primary Etiology: [1:Trauma, Other] [N/A:N/A] Comorbid History: [1:Hypertension, Type II Diabetes, Osteoarthritis] [N/A:N/A] Date Acquired: [1:04/06/2017] [N/A:N/A] Weeks of Treatment: [1:0] [N/A:N/A] Wound Status: [1:Open] [N/A:N/A] Measurements L x W x D 2.5x1x0.1 [N/A:N/A] (cm) Area (cm) : [1:1.963] [N/A:N/A] Volume (cm) : [1:0.196] [N/A:N/A] % Reduction in Area: [1:24.30%] [N/A:N/A] % Reduction in Volume: 24.30% [N/A:N/A] Classification: [1:Full Thickness Without Exposed Support Structures] [N/A:N/A] Exudate Amount: [1:Large] [N/A:N/A] Exudate Type: [1:Serous] [N/A:N/A] Exudate Color: [1:amber] [N/A:N/A] Wound Margin: [1:Flat and Intact] [N/A:N/A] Granulation Amount: [1:Small (1-33%)] [N/A:N/A] Granulation Quality: [1:Pink] [N/A:N/A] Necrotic Amount: [1:Large (67-100%)] [N/A:N/A] Necrotic Tissue: [1:Eschar, Adherent Slough] [N/A:N/A] Exposed Structures: [1:Fat Layer (  Subcutaneous Tissue) Exposed: Yes Fascia: No] [N/A:N/A] Tendon: No Muscle: No Joint: No Bone: No Epithelialization: None N/A N/A Debridement: Debridement (24580- N/A N/A 11047) Pre-procedure 09:40 N/A N/A Verification/Time Out Taken: Pain Control: Lidocaine 4% Topical N/A N/A Solution Tissue Debrided: Fibrin/Slough, N/A N/A Subcutaneous Level: Skin/Subcutaneous N/A N/A Tissue Debridement Area (sq 2.5 N/A N/A cm): Instrument: Curette N/A N/A Bleeding: Minimum N/A N/A Hemostasis Achieved: Pressure N/A N/A Procedural Pain: 0 N/A N/A Post Procedural Pain: 0 N/A N/A Debridement Treatment Procedure was tolerated N/A N/A Response: well Post Debridement 2.5x1x0.2 N/A N/A Measurements L x W x D (cm) Post Debridement 0.393 N/A N/A Volume: (cm) Periwound Skin Texture: Excoriation: No N/A N/A Induration: No Callus: No Crepitus: No Rash:  No Scarring: No Periwound Skin Maceration: No N/A N/A Moisture: Dry/Scaly: No Periwound Skin Color: Atrophie Blanche: No N/A N/A Cyanosis: No Ecchymosis: No Erythema: No Hemosiderin Staining: No Mottled: No Pallor: No Rubor: No Temperature: No Abnormality N/A N/A Tenderness on Yes N/A N/A Palpation: Wound Preparation: N/A N/A [name], Juan ([id number]) Ulcer Cleansing: Rinsed/Irrigated with Saline Topical Anesthetic Applied: Other: lidocaine 4% Procedures Performed: Debridement N/A N/A Treatment Notes Electronic Signature(s) Signed: 05/03/2017 9:56:28 AM By: Christin Fudge MD, FACS Entered By: Christin Fudge on 05/03/2017 09:56:28 [name], Juan ([id number]) -------------------------------------------------------------------------------- [place] Details Patient Name: HOYTE, ZIEBELL. Date of Service: 05/03/2017 9:15 AM Medical Record Number: [id number] Patient Account Number: [id number] Date of Birth/Sex: 1951-03-06 (66 y.o. Male) Treating RN: Montey Hora Primary Care Hydeia Mcatee: Alma Friendly Other Clinician: Referring Kaidan Spengler: Alma Friendly Treating Melis Trochez/Extender: Frann Rider in Treatment: 0 Active Inactive ` Nutrition Nursing Diagnoses: Impaired glucose control: actual or potential Goals: Patient/caregiver verbalizes understanding of need to maintain therapeutic glucose control per primary care physician Date Initiated: 04/27/2017 Target Resolution Date: 07/04/2017 Goal Status: Active Interventions: Assess HgA1c results as ordered upon admission and as needed Provide education on elevated blood sugars and impact on wound healing Notes: ` Orientation to the Wound Care Program Nursing Diagnoses: Knowledge deficit related to the wound healing center program Goals: Patient/caregiver will verbalize understanding of the [place] Date Initiated: 04/27/2017 Target Resolution Date: 07/20/2017 Goal Status:  Active Interventions: Provide education on orientation to the wound center Notes: ` Wound/Skin Impairment Nursing Diagnoses: [name], [name]) Impaired tissue integrity Goals: Ulcer/skin breakdown will heal within 14 weeks Date Initiated: 04/27/2017 Target Resolution Date: 07/20/2017 Goal Status: Active Interventions: Assess patient/caregiver ability to obtain necessary supplies Assess patient/caregiver ability to perform ulcer/skin care regimen upon admission and as needed Assess ulceration(s) every visit Notes: Electronic Signature(s) Signed: 05/03/2017 3:15:59 PM By: Montey Hora Entered By: Montey Hora on 05/03/2017 09:34:20 [name] ([id number]) -------------------------------------------------------------------------------- Pain Assessment Details Patient Name: [name]. Date of Service: 05/03/2017 9:15 AM Medical Record Number: [id number] Patient Account Number: [id number] Date of Birth/Sex: 1951/02/20 (66 y.o. Male) Treating RN: Montey Hora Primary Care Finneus Kaneshiro: Alma Friendly Other Clinician: Referring Coni Homesley: Alma Friendly Treating Heavyn Yearsley/Extender: Frann Rider in Treatment: 0 Active Problems Location of Pain Severity and Description of Pain Patient Has Paino No Site Locations Pain Management and Medication Current Pain Management: Notes Topical or injectable lidocaine is offered to patient for acute pain when surgical debridement is performed. If needed, Patient is instructed to use over the counter pain medication for the following 24-48 hours after debridement. Wound care MDs do not prescribed pain medications. Patient has chronic pain or uncontrolled pain. Patient has been instructed to make an appointment with their Primary Care Physician for  pain management. Electronic Signature(s) Signed: 05/03/2017 3:15:59 PM By: Montey Hora Entered By: Montey Hora on 05/03/2017 09:27:41 [name]  ([id number]) -------------------------------------------------------------------------------- Patient/Caregiver Education Details Patient Name: [name], [name]. Date of Service: 05/03/2017 9:15 AM Medical Record Number: [id number] Patient Account Number: [id number] Date of Birth/Gender: 1951-02-02 (65 y.o. Male) Treating RN: Montey Hora Primary Care Physician: Alma Friendly Other Clinician: Referring Physician: Alma Friendly Treating Physician/Extender: Frann Rider in Treatment: 0 Education Assessment Education Provided To: Patient and Caregiver Education Topics Provided Wound/Skin Impairment: Handouts: Other: wound care as ordered Methods: Demonstration, Explain/Verbal Responses: State content correctly Electronic Signature(s) Signed: 05/03/2017 3:15:59 PM By: Montey Hora Entered By: Montey Hora on 05/03/2017 14:42:03 [name] ([id number]) -------------------------------------------------------------------------------- Wound Assessment Details Patient Name: [name]. Date of Service: 05/03/2017 9:15 AM Medical Record Number: [id number] Patient Account Number: [id number] Date of Birth/Sex: 10-Oct-1950 (66 y.o. Male) Treating RN: Montey Hora Primary Care Brannon Decaire: Alma Friendly Other Clinician: Referring Kylee Nardozzi: Alma Friendly Treating Caulin Begley/Extender: Frann Rider in Treatment: 0 Wound Status Wound Number: 1 Primary Trauma, Other Etiology: Wound Location: Right Lower Leg - Posterior Wound Status: Open Wounding Event: Trauma Comorbid Hypertension, Type II Diabetes, Date Acquired: 04/06/2017 History: Osteoarthritis Weeks Of Treatment: 0 Clustered Wound: No Photos Photo Uploaded By: Montey Hora on 05/03/2017 15:12:21 Wound Measurements Length: (cm) 2.5 Width: (cm) 1 Depth: (cm) 0.1 Area: (cm) 1.963 Volume: (cm) 0.196 % Reduction in Area: 24.3% % Reduction in Volume: 24.3% Epithelialization: None Tunneling:  No Undermining: No Wound Description Full Thickness Without Exposed Foul Odor After Classification: Support Structures Slough/Fibrino Wound Margin: Flat and Intact Exudate Large Amount: Exudate Type: Serous Exudate Color: amber Cleansing: No Yes Wound Bed Granulation Amount: Small (1-33%) Exposed Structure Granulation Quality: Pink Fascia Exposed: No Necrotic Amount: Large (67-100%) Fat Layer (Subcutaneous Tissue) Exposed: Yes GABE, GLACE ([id number]) Necrotic Quality: Eschar, Adherent Slough Tendon Exposed: No Muscle Exposed: No Joint Exposed: No Bone Exposed: No Periwound Skin Texture Texture Color No Abnormalities Noted: No No Abnormalities Noted: No Callus: No Atrophie Blanche: No Crepitus: No Cyanosis: No Excoriation: No Ecchymosis: No Induration: No Erythema: No Rash: No Hemosiderin Staining: No Scarring: No Mottled: No Pallor: No Moisture Rubor: No No Abnormalities Noted: No Dry / Scaly: No Temperature / Pain Maceration: No Temperature: No Abnormality Tenderness on Palpation: Yes Wound Preparation Ulcer Cleansing: Rinsed/Irrigated with Saline Topical Anesthetic Applied: Other: lidocaine 4%, Treatment Notes Wound #1 (Right, Posterior Lower Leg) 1. Cleansed with: Clean wound with Normal Saline 2. Anesthetic Topical Lidocaine 4% cream to wound bed prior to debridement 4. Dressing Applied: Santyl Ointment 5. Secondary Dressing Applied Dry [place] Signature(s) Signed: 05/03/2017 3:15:59 PM By: Montey Hora Entered By: Montey Hora on 05/03/2017 09:33:48 [name] ([id number]) -------------------------------------------------------------------------------- Vitals Details Patient Name: [name]. Date of Service: 05/03/2017 9:15 AM Medical Record Number: [id number] Patient Account Number: [id number] Date of Birth/Sex: 1950-09-02 (66 y.o. Male) Treating RN: Montey Hora Primary Care Davide Risdon: Alma Friendly Other Clinician: Referring Patryck Kilgore: Alma Friendly Treating Fed Ceci/Extender: Frann Rider in Treatment: 0 Vital Signs Time Taken: 09:27 Temperature (F): 97.9 Height (in): 70 Pulse (bpm): 87 Weight (lbs): 302 Respiratory Rate (breaths/min): 18 Body Mass Index (BMI): 43.3 Blood Pressure (mmHg): 151/72 Reference Range: 80 - 120 mg / dl Electronic Signature(s) Signed: 05/03/2017 3:15:59 PM By: Montey Hora Entered By: Montey Hora on 05/03/2017 09:29:10

## 2017-05-07 NOTE — Progress Notes (Signed)
OMA, ALPERT ([id number]) Visit Report for 05/03/2017 Chief Complaint Document Details Patient Name: [name], [name]. Date of Service: 05/03/2017 9:15 AM Medical Record Number: [id number] Patient Account Number: [id number] Date of Birth/Sex: September 26, 1950 (66 y.o. Male) Treating RN: Montey Hora Primary Care Provider: Alma Friendly Other Clinician: Referring Provider: Alma Friendly Treating Provider/Extender: Frann Rider in Treatment: 0 Information Obtained from: Patient Chief Complaint Patient seen for complaints of Non-Healing Wound to the right calf which he sustained about 3 weeks ago Electronic Signature(s) Signed: 05/03/2017 9:56:43 AM By: Christin Fudge MD, FACS Entered By: Christin Fudge on 05/03/2017 09:56:42 [name] ([id number]) -------------------------------------------------------------------------------- Debridement Details Patient Name: [name]. Date of Service: 05/03/2017 9:15 AM Medical Record Number: [id number] Patient Account Number: [id number] Date of Birth/Sex: 1950/09/06 (66 y.o. Male) Treating RN: Montey Hora Primary Care Provider: Alma Friendly Other Clinician: Referring Provider: Alma Friendly Treating Provider/Extender: Frann Rider in Treatment: 0 Debridement Performed for Wound #1 Right,Posterior Lower Leg Assessment: Performed By: Physician Christin Fudge, MD Debridement: Debridement Pre-procedure Verification/Time Out Yes - 09:40 Taken: Start Time: 09:40 Pain Control: Lidocaine 4% Topical Solution Level: Skin/Subcutaneous Tissue Total Area Debrided (L x 2.5 (cm) x 1 (cm) = 2.5 (cm) W): Tissue and other Viable, Non-Viable, Fibrin/Slough, Subcutaneous material debrided: Instrument: Curette Bleeding: Minimum Hemostasis Achieved: Pressure End Time: 09:42 Procedural Pain: 0 Post Procedural Pain: 0 Response to Treatment: Procedure was tolerated well Post Debridement Measurements of Total  Wound Length: (cm) 2.5 Width: (cm) 1 Depth: (cm) 0.2 Volume: (cm) 0.393 Character of Wound/Ulcer Post Improved Debridement: Post Procedure Diagnosis Same as Pre-procedure Electronic Signature(s) Signed: 05/03/2017 9:56:37 AM By: Christin Fudge MD, FACS Signed: 05/03/2017 3:15:59 PM By: Montey Hora Entered By: Christin Fudge on 05/03/2017 09:56:37 [name] ([id number]) -------------------------------------------------------------------------------- HPI Details Patient Name: [name]. Date of Service: 05/03/2017 9:15 AM Medical Record Number: [id number] Patient Account Number: [id number] Date of Birth/Sex: [birth year] (66 y.o. Male) Treating RN: Montey Hora Primary Care Provider: Alma Friendly Other Clinician: Referring Provider: Alma Friendly Treating Provider/Extender: Frann Rider in Treatment: 0 History of Present Illness Location: right calf Quality: Patient reports experiencing a dull pain to affected area(s). Severity: Patient states wound are getting worse. Duration: Patient has had the wound for < 4 weeks prior to presenting for treatment Timing: Pain in wound is Intermittent (comes and goes Context: The wound occurred when the patient had a laceration with a tractor Modifying Factors: Other treatment(s) tried include:oral antibiotics and Bactroban ointment Associated Signs and Symptoms: Patient reports having increase discharge. HPI Description: 66 year old male with a history of diabetes mellitus type 2 has been seeing his PCP for lower extremity laceration and was initially seen on September 20. 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 The nurse practitioner at the practice was concerned about her venous stasis ulcer and referred him to the wound center. He is a former smoker who quit in 2016 and has a past medical history of arthritis,  bronchitis, colon polyps, diabetes mellitus and hypertension Last hemoglobin A1c about a year ago was 7.5%. He says he's had a more recent hemoglobin A1c done and it was about 7.2% 05/03/2017 -- he was unable to get Santyl through his insurance and hence he's been using Medihoney. Electronic Signature(s) Signed: 05/03/2017 9:57:23 AM By: Christin Fudge MD, FACS Entered By: Christin Fudge on 05/03/2017 09:57:23 [name] ([id number]) -------------------------------------------------------------------------------- Physical Exam Details Patient Name:  Juan Baumgarten M. Date of Service: 05/03/2017 9:15 AM Medical Record Number: [id number] Patient Account Number: [id number] Date of Birth/Sex: 1950-07-20 (66 y.o. Male) Treating RN: Montey Hora Primary Care Provider: Alma Friendly Other Clinician: Referring Provider: Alma Friendly Treating Provider/Extender: Frann Rider in Treatment: 0 Constitutional . Pulse regular. Respirations normal and unlabored. Afebrile. . Eyes Nonicteric. Reactive to light. Ears, Nose, Mouth, and Throat Lips, teeth, and gums WNL.Marland Kitchen Moist mucosa without lesions. Neck supple and nontender. No palpable supraclavicular or cervical adenopathy. Normal sized without goiter. Respiratory WNL. No retractions.. Cardiovascular Pedal Pulses WNL. No clubbing, cyanosis or edema. Lymphatic No adneopathy. No adenopathy. No adenopathy. Musculoskeletal Adexa without tenderness or enlargement.. Digits and nails w/o clubbing, cyanosis, infection, petechiae, ischemia, or inflammatory conditions.. Integumentary (Hair, Skin) No suspicious lesions. No crepitus or fluctuance. No peri-wound warmth or erythema. No masses.Marland Kitchen Psychiatric Judgement and insight Intact.. No evidence of depression, anxiety, or agitation.. Notes wound on the right medial calf need sharp debridement with #3 curet and brisk bleeding controlled with pressure Electronic Signature(s) Signed:  05/03/2017 9:58:11 AM By: Christin Fudge MD, FACS Entered By: Christin Fudge on 05/03/2017 09:58:10 [name] ([id number]) -------------------------------------------------------------------------------- Physician Orders Details Patient Name: [name]. Date of Service: 05/03/2017 9:15 AM Medical Record Number: [id number] Patient Account Number: [id number] Date of Birth/Sex: 1950-12-13 (66 y.o. Male) Treating RN: Montey Hora Primary Care Provider: Alma Friendly Other Clinician: Referring Provider: Alma Friendly Treating Provider/Extender: Frann Rider in Treatment: 0 Verbal / Phone Orders: No Diagnosis Coding Wound Cleansing Wound #1 Right,Posterior Lower Leg o Clean wound with Normal Saline. o May Shower, gently pat wound dry prior to applying new dressing. Anesthetic Wound #1 Right,Posterior Lower Leg o Topical Lidocaine 4% cream applied to wound bed prior to debridement Primary Wound Dressing Wound #1 Right,Posterior Lower Leg o Santyl Ointment - in clinic o Medihoney gel - at home Secondary Dressing Wound #1 Right,Posterior Lower Leg o Boardered Foam Dressing - or telfa island dressing Dressing Change Frequency Wound #1 Right,Posterior Lower Leg o Change dressing every day. Follow-up Appointments Wound #1 Right,Posterior Lower Leg o Return Appointment in 1 week. Edema Control Wound #1 Right,Posterior Lower Leg o Elevate legs to the level of the heart and pump ankles as often as possible Additional Orders / Instructions Wound #1 Right,Posterior Lower Leg o Increase protein intake. o Other: - Try to keep blood sugars below 180 and please add vitamin A, vitamin C and zinc supplements to your diet [name], [name] ([id number]) Medications-please add to medication list. Wound #1 Right,Posterior Lower Leg o Santyl Enzymatic Ointment Electronic Signature(s) Signed: 05/03/2017 3:15:59 PM By: Montey Hora Signed: 05/03/2017  4:12:58 PM By: Christin Fudge MD, FACS Entered By: Montey Hora on 05/03/2017 09:44:10 [name] ([id number]) -------------------------------------------------------------------------------- Problem List Details Patient Name: [name], [name]. Date of Service: 05/03/2017 9:15 AM Medical Record Number: [id number] Patient Account Number: [id number] Date of Birth/Sex: [birth year] (66 y.o. Male) Treating RN: Montey Hora Primary Care Provider: Alma Friendly Other Clinician: Referring Provider: Alma Friendly Treating Provider/Extender: Frann Rider in Treatment: 0 Active Problems ICD-10 Encounter Code Description Active Date Diagnosis E11.622 Type 2 diabetes mellitus with other skin ulcer 04/27/2017 Yes S81.811A Laceration without foreign body, right lower leg, initial 04/27/2017 Yes encounter L97.212 Non-pressure chronic ulcer of right calf with fat layer 04/27/2017 Yes exposed E66.01 Morbid (severe) obesity due to excess calories 04/27/2017 Yes Inactive Problems Resolved Problems Electronic Signature(s) Signed: 05/03/2017 9:56:24 AM By: Christin Fudge MD, FACS Entered By: Christin Fudge on 05/03/2017  09:56:24 [name], [name] ([id number]) -------------------------------------------------------------------------------- Progress Note Details Patient Name: [name], [name]. Date of Service: 05/03/2017 9:15 AM Medical Record Number: [id number] Patient Account Number: [id number] Date of Birth/Sex: 11/22/1950 (66 y.o. Male) Treating RN: Montey Hora Primary Care Provider: Alma Friendly Other Clinician: Referring Provider: Alma Friendly Treating Provider/Extender: Frann Rider in Treatment: 0 Subjective Chief Complaint Information obtained from Patient Patient seen for complaints of Non-Healing Wound to the right calf which he sustained about 3 weeks ago History of Present Illness (HPI) The following HPI elements were documented for the patient's  wound: Location: right calf Quality: Patient reports experiencing a dull pain to affected area(s). Severity: Patient states wound are getting worse. Duration: Patient has had the wound for < 4 weeks prior to presenting for treatment Timing: Pain in wound is Intermittent (comes and goes Context: The wound occurred when the patient had a laceration with a tractor Modifying Factors: Other treatment(s) tried include:oral antibiotics and Bactroban ointment Associated Signs and Symptoms: Patient reports having increase discharge. 66 year old male with a history of diabetes mellitus type 2 has been seeing his PCP for lower extremity laceration and was initially seen on September 20. 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 The nurse practitioner at the practice was concerned about her venous stasis ulcer and referred him to the wound center. He is a former smoker who quit in 2016 and has a past medical history of arthritis, bronchitis, colon polyps, diabetes mellitus and hypertension Last hemoglobin A1c about a year ago was 7.5%. He says he's had a more recent hemoglobin A1c done and it was about 7.2% 05/03/2017 -- he was unable to get Santyl through his insurance and hence he's been using Medihoney. Objective Constitutional Pulse regular. Respirations normal and unlabored. Afebrile. [name], [name] ([id number]) Vitals Time Taken: 9:27 AM, Height: 70 in, Weight: 302 lbs, BMI: 43.3, Temperature: 97.9 F, Pulse: 87 bpm, Respiratory Rate: 18 breaths/min, Blood Pressure: 151/72 mmHg. Eyes Nonicteric. Reactive to light. Ears, Nose, Mouth, and Throat Lips, teeth, and gums WNL.Marland Kitchen Moist mucosa without lesions. Neck supple and nontender. No palpable supraclavicular or cervical adenopathy. Normal sized without goiter. Respiratory WNL. No retractions.. Cardiovascular Pedal Pulses WNL. No clubbing, cyanosis or  edema. Lymphatic No adneopathy. No adenopathy. No adenopathy. Musculoskeletal Adexa without tenderness or enlargement.. Digits and nails w/o clubbing, cyanosis, infection, petechiae, ischemia, or inflammatory conditions.Marland Kitchen Psychiatric Judgement and insight Intact.. No evidence of depression, anxiety, or agitation.. General Notes: wound on the right medial calf need sharp debridement with #3 curet and brisk bleeding controlled with pressure Integumentary (Hair, Skin) No suspicious lesions. No crepitus or fluctuance. No peri-wound warmth or erythema. No masses.. Wound #1 status is Open. Original cause of wound was Trauma. The wound is located on the Right,Posterior Lower Leg. The wound measures 2.5cm length x 1cm width x 0.1cm depth; 1.963cm^2 area and 0.196cm^3 volume. There is Fat Layer (Subcutaneous Tissue) Exposed exposed. There is no tunneling or undermining noted. There is a large amount of serous drainage noted. The wound margin is flat and intact. There is small (1-33%) pink granulation within the wound bed. There is a large (67-100%) amount of necrotic tissue within the wound bed including Eschar and Adherent Slough. The periwound skin appearance did not exhibit: Callus, Crepitus, Excoriation, Induration, Rash, Scarring, Dry/Scaly, Maceration, Atrophie Blanche, Cyanosis, Ecchymosis, Hemosiderin Staining, Mottled, Pallor, Rubor, Erythema. Periwound temperature was noted as No Abnormality. The periwound  has tenderness on palpation. Assessment [name], [name] ([id number]) Active Problems ICD-10 E11.622 - Type 2 diabetes mellitus with other skin ulcer S81.811A - Laceration without foreign body, right lower leg, initial encounter L97.212 - Non-pressure chronic ulcer of right calf with fat layer exposed E66.01 - Morbid (severe) obesity due to excess calories Procedures Wound #1 Pre-procedure diagnosis of Wound #1 is a Trauma, Other located on the Right,Posterior Lower Leg . There was a  Skin/Subcutaneous Tissue Debridement ([id number]) debridement with total area of 2.5 sq cm performed by Christin Fudge, MD. with the following instrument(s): Curette to remove Viable and Non-Viable tissue/material including Fibrin/Slough and Subcutaneous after achieving pain control using Lidocaine 4% Topical Solution. A time out was conducted at 09:40, prior to the start of the procedure. A Minimum amount of bleeding was controlled with Pressure. The procedure was tolerated well with a pain level of 0 throughout and a pain level of 0 following the procedure. Post Debridement Measurements: 2.5cm length x 1cm width x 0.2cm depth; 0.393cm^3 volume. Character of Wound/Ulcer Post Debridement is improved. Post procedure Diagnosis Wound #1: Same as Pre-Procedure Plan Wound Cleansing: Wound #1 Right,Posterior Lower Leg: Clean wound with Normal Saline. May Shower, gently pat wound dry prior to applying new dressing. Anesthetic: Wound #1 Right,Posterior Lower Leg: Topical Lidocaine 4% cream applied to wound bed prior to debridement Primary Wound Dressing: Wound #1 Right,Posterior Lower Leg: Santyl Ointment - in clinic Medihoney gel - at home Secondary Dressing: Wound #1 Right,Posterior Lower Leg: Boardered Foam Dressing - or telfa island dressing Dressing Change Frequency: [name], [name] ([id number]) Wound #1 Right,Posterior Lower Leg: Change dressing every day. Follow-up Appointments: Wound #1 Right,Posterior Lower Leg: Return Appointment in 1 week. Edema Control: Wound #1 Right,Posterior Lower Leg: Elevate legs to the level of the heart and pump ankles as often as possible Additional Orders / Instructions: Wound #1 Right,Posterior Lower Leg: Increase protein intake. Other: - Try to keep blood sugars below 180 and please add vitamin A, vitamin C and zinc supplements to your diet Medications-please add to medication list.: Wound #1 Right,Posterior Lower Leg: Santyl Enzymatic  Ointment The lacerated wound on his right calf which needed some sharp debridement and after review I have recommended: 1. Medihoney ointment locally(Santyl too expensive), to be applied daily after washing with soap and water 2. Good control of his diabetes mellitus 3. Adequate protein, vitamin A, vitamin C and zinc 4. Regular physician wound center Electronic Signature(s) Signed: 05/03/2017 9:59:23 AM By: Christin Fudge MD, FACS Entered By: Christin Fudge on 05/03/2017 09:59:22 [name], [name] ([id number]) -------------------------------------------------------------------------------- SuperBill Details Patient Name: [name]. Date of Service: 05/03/2017 Medical Record Number: [id number] Patient Account Number: [id number] Date of Birth/Sex: 24-Jul-1950 (66 y.o. Male) Treating RN: Montey Hora Primary Care Provider: Alma Friendly Other Clinician: Referring Provider: Alma Friendly Treating Provider/Extender: Frann Rider in Treatment: 0 Diagnosis Coding ICD-10 Codes Code Description [phone] Type 2 diabetes mellitus with other skin ulcer S81.811A Laceration without foreign body, right lower leg, initial encounter L97.212 Non-pressure chronic ulcer of right calf with fat layer exposed E66.01 Morbid (severe) obesity due to excess calories Facility Procedures CPT4 Code Description: [id number] - DEB SUBQ TISSUE 20 SQ CM/< ICD-10 Description Diagnosis E11.622 Type 2 diabetes mellitus with other skin ulcer S81.811A Laceration without foreign body, right lower leg, init 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 Modifier: ial encounte r exposed Quantity: 1 r Physician Procedures CPT4 Code Description: [id number] - WC PHYS SUBQ TISS 20  SQ CM ICD-10 Description Diagnosis E11.622 Type 2 diabetes mellitus with other skin ulcer S81.811A Laceration without foreign body, right lower leg, init L97.212 Non-pressure chronic ulcer of  right calf with fat  laye Modifier: ial encounte r exposed Quantity: 1 r Electronic Signature(s) Signed: 05/03/2017 10:05:13 AM By: Christin Fudge MD, FACS Entered By: Christin Fudge on 05/03/2017 10:05:12

## 2017-05-10 ENCOUNTER — Ambulatory Visit (INDEPENDENT_AMBULATORY_CARE_PROVIDER_SITE_OTHER): Payer: BLUE CROSS/BLUE SHIELD

## 2017-05-10 ENCOUNTER — Encounter: Payer: BLUE CROSS/BLUE SHIELD | Admitting: Surgery

## 2017-05-10 DIAGNOSIS — E11622 Type 2 diabetes mellitus with other skin ulcer: Secondary | ICD-10-CM | POA: Diagnosis not present

## 2017-05-10 DIAGNOSIS — Z23 Encounter for immunization: Secondary | ICD-10-CM | POA: Diagnosis not present

## 2017-05-14 NOTE — Progress Notes (Signed)
BALRAJ, BRAYFIELD ([id number]) Visit Report for 05/10/2017 Chief Complaint Document Details Patient Name: [name], [name]. Date of Service: 05/10/2017 9:15 AM Medical Record Number: [id number] Patient Account Number: [id number] Date of Birth/Sex: [birth year] (66 y.o. Male) Treating RN: Montey Hora Primary Care Provider: Alma Friendly Other Clinician: Referring Provider: Alma Friendly Treating Provider/Extender: Frann Rider in Treatment: 1 Information Obtained from: Patient Chief Complaint Patient seen for complaints of Non-Healing Wound to the right calf which he sustained about 3 weeks ago Electronic Signature(s) Signed: 05/10/2017 10:30:00 AM By: Christin Fudge MD, FACS Entered By: Christin Fudge on 05/10/2017 10:30:00 [name] ([id number]) -------------------------------------------------------------------------------- Debridement Details Patient Name: [name]. Date of Service: 05/10/2017 9:15 AM Medical Record Number: [id number] Patient Account Number: [id number] Date of Birth/Sex: March 20, 1951 (66 y.o. Male) Treating RN: Montey Hora Primary Care Provider: Alma Friendly Other Clinician: Referring Provider: Alma Friendly Treating Provider/Extender: Frann Rider in Treatment: 1 Debridement Performed for Wound #1 Right,Posterior Lower Leg Assessment: Performed By: Physician Christin Fudge, MD Debridement: Debridement Pre-procedure Verification/Time Yes - 09:31 Out Taken: Start Time: 09:31 Pain Control: Lidocaine 4% Topical Solution Level: Skin/Subcutaneous Tissue Total Area Debrided (L x W): 2.4 (cm) x 0.8 (cm) = 1.92 (cm) Tissue and other material Viable, Non-Viable, Fibrin/Slough, Subcutaneous debrided: Instrument: Curette Bleeding: Minimum Hemostasis Achieved: Pressure End Time: 09:33 Procedural Pain: 0 Post Procedural Pain: 0 Post Debridement Measurements of Total Wound Length: (cm) 2.4 Width: (cm) 0.8 Depth: (cm) 0.2 Volume:  (cm) 0.302 Character of Wound/Ulcer Post Debridement: Improved Post Procedure Diagnosis Same as Pre-procedure Electronic Signature(s) Signed: 05/10/2017 4:33:04 PM By: Christin Fudge MD, FACS Signed: 05/11/2017 4:32:17 PM By: Montey Hora Entered By: Montey Hora on 05/10/2017 09:33:52 [name] ([id number]) -------------------------------------------------------------------------------- HPI Details Patient Name: [name]. Date of Service: 05/10/2017 9:15 AM Medical Record Number: [id number] Patient Account Number: [id number] Date of Birth/Sex: February 27, 1951 (66 y.o. Male) Treating RN: Montey Hora Primary Care Provider: Alma Friendly Other Clinician: Referring Provider: Alma Friendly Treating Provider/Extender: Frann Rider in Treatment: 1 History of Present Illness Location: right calf Quality: Patient reports experiencing a dull pain to affected area(s). Severity: Patient states wound are getting worse. Duration: Patient has had the wound for < 4 weeks prior to presenting for treatment Timing: Pain in wound is Intermittent (comes and goes Context: The wound occurred when the patient had a laceration with a tractor Modifying Factors: Other treatment(s) tried include:oral antibiotics and Bactroban ointment Associated Signs and Symptoms: Patient reports having increase discharge. HPI Description: 66 year old male with a history of diabetes mellitus type 2 has been seeing his PCP for lower extremity laceration and was initially seen on September 20. 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 The nurse practitioner at the practice was concerned about her venous stasis ulcer and referred him to the wound center. He is a former smoker who quit in 2016 and has a past medical history of arthritis, bronchitis, colon polyps, diabetes mellitus and hypertension Last hemoglobin  A1c about a year ago was 7.5%. He says he's had a more recent hemoglobin A1c done and it was about 7.2% 05/03/2017 -- he was unable to get Santyl through his insurance and hence he's been using Medihoney. Electronic Signature(s) Signed: 05/10/2017 10:30:05 AM By: Christin Fudge MD, FACS Entered By: Christin Fudge on 05/10/2017 10:30:05 [name] ([id number]) -------------------------------------------------------------------------------- Physical Exam Details Patient Name: GENIE, MIRABAL. Date of Service: 05/10/2017  9:15 AM Medical Record Number: [id number] Patient Account Number: [id number] Date of Birth/Sex: January 12, 1951 (66 y.o. Male) Treating RN: Montey Hora Primary Care Provider: Alma Friendly Other Clinician: Referring Provider: Alma Friendly Treating Provider/Extender: Frann Rider in Treatment: 1 Constitutional . Pulse regular. Respirations normal and unlabored. Afebrile. . Eyes Nonicteric. Reactive to light. Ears, Nose, Mouth, and Throat Lips, teeth, and gums WNL.Marland Kitchen Moist mucosa without lesions. Neck supple and nontender. No palpable supraclavicular or cervical adenopathy. Normal sized without goiter. Respiratory WNL. No retractions.. Cardiovascular Pedal Pulses WNL. No clubbing, cyanosis or edema. Chest Breasts symmetical and no nipple discharge.. Breast tissue WNL, no masses, lumps, or tenderness.. Lymphatic No adneopathy. No adenopathy. No adenopathy. Musculoskeletal Adexa without tenderness or enlargement.. Digits and nails w/o clubbing, cyanosis, infection, petechiae, ischemia, or inflammatory conditions.. Integumentary (Hair, Skin) No suspicious lesions. No crepitus or fluctuance. No peri-wound warmth or erythema. No masses.Marland Kitchen Psychiatric Judgement and insight Intact.. No evidence of depression, anxiety, or agitation.. Notes the wound needed some sharp debridement and once this was done brisk bleeding controlled with pressure Electronic  Signature(s) Signed: 05/10/2017 10:30:28 AM By: Christin Fudge MD, FACS Entered By: Christin Fudge on 05/10/2017 10:30:28 [name] ([id number]) -------------------------------------------------------------------------------- Physician Orders Details Patient Name: LEONA, PRESSLY. Date of Service: 05/10/2017 9:15 AM Medical Record Number: [id number] Patient Account Number: [id number] Date of Birth/Sex: [birth year] (66 y.o. Male) Treating RN: Montey Hora Primary Care Provider: Alma Friendly Other Clinician: Referring Provider: Alma Friendly Treating Provider/Extender: Frann Rider in Treatment: 1 Verbal / Phone Orders: No Diagnosis Coding Wound Cleansing Wound #1 Right,Posterior Lower Leg o Clean wound with Normal Saline. o May Shower, gently pat wound dry prior to applying new dressing. Anesthetic Wound #1 Right,Posterior Lower Leg o Topical Lidocaine 4% cream applied to wound bed prior to debridement Primary Wound Dressing Wound #1 Right,Posterior Lower Leg o Santyl Ointment - in clinic o Medihoney gel - at home Secondary Dressing Wound #1 Right,Posterior Lower Leg o Boardered Foam Dressing - or telfa island dressing Dressing Change Frequency Wound #1 Right,Posterior Lower Leg o Change dressing every day. Follow-up Appointments Wound #1 Right,Posterior Lower Leg o Return Appointment in 1 week. Edema Control Wound #1 Right,Posterior Lower Leg o Elevate legs to the level of the heart and pump ankles as often as possible Additional Orders / Instructions Wound #1 Right,Posterior Lower Leg o Increase protein intake. o Other: - Try to keep blood sugars below 180 and please add vitamin A, vitamin C and zinc supplements to your diet Medications-please add to medication list. Wound #1 Right,Posterior Lower Leg o [address] JONUEL, BUTTERFIELD ([id number]) Electronic Signature(s) Signed: 05/10/2017 4:33:04 PM By: Christin Fudge  MD, FACS Signed: 05/11/2017 4:32:17 PM By: Montey Hora Entered By: Montey Hora on 05/10/2017 09:34:12 JAISEAN, MONTEFORTE ([id number]) -------------------------------------------------------------------------------- Progress Note Details Patient Name: CUAHUTEMOC, ATTAR. Date of Service: 05/10/2017 9:15 AM Medical Record Number: [id number] Patient Account Number: [id number] Date of Birth/Sex: April 13, 1951 (66 y.o. Male) Treating RN: Montey Hora Primary Care Provider: Alma Friendly Other Clinician: Referring Provider: Alma Friendly Treating Provider/Extender: Frann Rider in Treatment: 1 Subjective Chief Complaint Information obtained from Patient Patient seen for complaints of Non-Healing Wound to the right calf which he sustained about 3 weeks ago History of Present Illness (HPI) The following HPI elements were documented for the patient's wound: Location: right calf Quality: Patient reports experiencing a dull pain to affected area(s). Severity: Patient states wound are getting worse. Duration: Patient has had the wound for < 4 weeks  prior to presenting for treatment Timing: Pain in wound is Intermittent (comes and goes Context: The wound occurred when the patient had a laceration with a tractor Modifying Factors: Other treatment(s) tried include:oral antibiotics and Bactroban ointment Associated Signs and Symptoms: Patient reports having increase discharge. 66 year old male with a history of diabetes mellitus type 2 has been seeing his PCP for lower extremity laceration and was initially seen on September 20. 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 The nurse practitioner at the practice was concerned about her venous stasis ulcer and referred him to the wound center. He is a former smoker who quit in 2016 and has a past medical history of arthritis, bronchitis, colon polyps,  diabetes mellitus and hypertension Last hemoglobin A1c about a year ago was 7.5%. He says he's had a more recent hemoglobin A1c done and it was about 7.2% 05/03/2017 -- he was unable to get Santyl through his insurance and hence he's been using Medihoney. Objective Constitutional Pulse regular. Respirations normal and unlabored. Afebrile. Vitals Time Taken: 9:09 AM, Height: 70 in, Weight: 302 lbs, BMI: 43.3, Temperature: 98.3 F, Pulse: 81 bpm, Respiratory Rate: 18 breaths/min, Blood Pressure: 158/74 mmHg. Eyes Nonicteric. Reactive to light. DEMETRUIS, DEPAUL. ([id number]) Ears, Nose, Mouth, and Throat Lips, teeth, and gums WNL.Marland Kitchen Moist mucosa without lesions. Neck supple and nontender. No palpable supraclavicular or cervical adenopathy. Normal sized without goiter. Respiratory WNL. No retractions.. Cardiovascular Pedal Pulses WNL. No clubbing, cyanosis or edema. Chest Breasts symmetical and no nipple discharge.. Breast tissue WNL, no masses, lumps, or tenderness.. Lymphatic No adneopathy. No adenopathy. No adenopathy. Musculoskeletal Adexa without tenderness or enlargement.. Digits and nails w/o clubbing, cyanosis, infection, petechiae, ischemia, or inflammatory conditions.Marland Kitchen Psychiatric Judgement and insight Intact.. No evidence of depression, anxiety, or agitation.. General Notes: the wound needed some sharp debridement and once this was done brisk bleeding controlled with pressure Integumentary (Hair, Skin) No suspicious lesions. No crepitus or fluctuance. No peri-wound warmth or erythema. No masses.. Wound #1 status is Open. Original cause of wound was Trauma. The wound is located on the Right,Posterior Lower Leg. The wound measures 2.4cm length x 0.8cm width x 0.1cm depth; 1.508cm^2 area and 0.151cm^3 volume. There is Fat Layer (Subcutaneous Tissue) Exposed exposed. There is no tunneling or undermining noted. There is a large amount of serous drainage noted. The wound margin is  flat and intact. There is small (1-33%) pink granulation within the wound bed. There is a large (67-100%) amount of necrotic tissue within the wound bed including Eschar and Adherent Slough. The periwound skin appearance did not exhibit: Callus, Crepitus, Excoriation, Induration, Rash, Scarring, Dry/Scaly, Maceration, Atrophie Blanche, Cyanosis, Ecchymosis, Hemosiderin Staining, Mottled, Pallor, Rubor, Erythema. Periwound temperature was noted as No Abnormality. The periwound has tenderness on palpation. Procedures Wound #1 Pre-procedure diagnosis of Wound #1 is a Trauma, Other located on the Right,Posterior Lower Leg . There was a Skin/Subcutaneous Tissue Debridement ([id number]) debridement with total area of 1.92 sq cm performed by Christin Fudge, MD. with the following instrument(s): Curette to remove Viable and Non-Viable tissue/material including Fibrin/Slough and Subcutaneous after achieving pain control using Lidocaine 4% Topical Solution. A time out was conducted at 09:31, prior to the start of the procedure. A Minimum amount of bleeding was controlled with Pressure. The patient tolerated the procedure with a pain level of 0 throughout and a pain level of 0 following the procedure. Post Debridement Measurements: 2.4cm  length x 0.8cm width x 0.2cm depth; 0.302cm^3 volume. Character of Wound/Ulcer Post Debridement is improved. Post procedure Diagnosis Wound #1: Same as Pre-Procedure ZALEN, SEQUEIRA. ([id number]) Plan Wound Cleansing: Wound #1 Right,Posterior Lower Leg: Clean wound with Normal Saline. May Shower, gently pat wound dry prior to applying new dressing. Anesthetic: Wound #1 Right,Posterior Lower Leg: Topical Lidocaine 4% cream applied to wound bed prior to debridement Primary Wound Dressing: Wound #1 Right,Posterior Lower Leg: Santyl Ointment - in clinic Medihoney gel - at home Secondary Dressing: Wound #1 Right,Posterior Lower Leg: Boardered Foam Dressing - or telfa  [place] dressing Dressing Change Frequency: Wound #1 Right,Posterior Lower Leg: Change dressing every day. Follow-up Appointments: Wound #1 Right,Posterior Lower Leg: Return Appointment in 1 week. Edema Control: Wound #1 Right,Posterior Lower Leg: Elevate legs to the level of the heart and pump ankles as often as possible Additional Orders / Instructions: Wound #1 Right,Posterior Lower Leg: Increase protein intake. Other: - Try to keep blood sugars below 180 and please add vitamin A, vitamin C and zinc supplements to your diet Medications-please add to medication list.: Wound #1 Right,Posterior Lower Leg: Santyl Enzymatic Ointment his improvement has been slow and after sharp debridement and review I have recommended: 1. Medihoney ointment locally, to be applied daily after washing with soap and water 2. Good control of his diabetes mellitus 3. Adequate protein, vitamin A, vitamin C and zinc 4. Regular physician wound center Electronic Signature(s) Signed: 05/10/2017 10:31:24 AM By: Christin Fudge MD, FACS Entered By: Christin Fudge on 05/10/2017 10:31:24 EMILIO, BAYLOCK ([id number]) -------------------------------------------------------------------------------- SuperBill Details Patient Name: [name]. Date of Service: 05/10/2017 Medical Record Number: [id number] Patient Account Number: [id number] Date of Birth/Sex: [birth year] (66 y.o. Male) Treating RN: Montey Hora Primary Care Provider: Alma Friendly Other Clinician: Referring Provider: Alma Friendly Treating Provider/Extender: Frann Rider in Treatment: 1 Diagnosis Coding ICD-10 Codes Code Description [phone] Type 2 diabetes mellitus with other skin ulcer S81.811A Laceration without foreign body, right lower leg, initial encounter L97.212 Non-pressure chronic ulcer of right calf with fat layer exposed E66.01 Morbid (severe) obesity due to excess calories Facility Procedures CPT4 Code:  [id number] Description: 64332 - DEB SUBQ TISSUE 20 SQ CM/< ICD-10 Diagnosis Description E11.622 Type 2 diabetes mellitus with other skin ulcer S81.811A Laceration without foreign body, right lower leg, initial enc L97.212 Non-pressure chronic ulcer of right calf  with fat layer expos E66.01 Morbid (severe) obesity due to excess calories Modifier: ounter ed Quantity: 1 Physician Procedures CPT4 Code: 9518841 Description: 66063 - WC PHYS SUBQ TISS 20 SQ CM ICD-10 Diagnosis Description E11.622 Type 2 diabetes mellitus with other skin ulcer S81.811A Laceration without foreign body, right lower leg, initial enc L97.212 Non-pressure chronic ulcer of right calf  with fat layer expos E66.01 Morbid (severe) obesity due to excess calories Modifier: ounter ed Quantity: 1 Electronic Signature(s) Signed: 05/10/2017 10:31:34 AM By: Christin Fudge MD, FACS Entered By: Christin Fudge on 05/10/2017 10:31:33

## 2017-05-14 NOTE — Progress Notes (Signed)
WALDRON, GERRY ([id number]) Visit Report for 05/10/2017 Arrival Information Details Patient Name: [name], [name]. Date of Service: 05/10/2017 9:15 AM Medical Record Number: [id number] Patient Account Number: [id number] Date of Birth/Sex: 1951/03/29 (66 y.o. Male) Treating RN: Montey Hora Primary Care Ethel Veronica: Alma Friendly Other Clinician: Referring Mayla Biddy: Alma Friendly Treating Rian Koon/Extender: Frann Rider in Treatment: 1 Visit Information History Since Last Visit Added or deleted any medications: No Patient Arrived: Ambulatory Any new allergies or adverse reactions: No Arrival Time: 09:09 Had a fall or experienced change in No Accompanied By: spouse activities of daily living that may affect Transfer Assistance: None risk of falls: Patient Identification Verified: Yes Signs or symptoms of abuse/neglect since last visito No Secondary Verification Process Completed: Yes Hospitalized since last visit: No Patient Has Alerts: Yes Has Dressing in Place as Prescribed: Yes Patient Alerts: DMII Pain Present Now: No Electronic Signature(s) Signed: 05/11/2017 4:32:17 PM By: Montey Hora Entered By: Montey Hora on 05/10/2017 09:09:26 [name] ([id number]) -------------------------------------------------------------------------------- Encounter Discharge Information Details Patient Name: [name]. Date of Service: 05/10/2017 9:15 AM Medical Record Number: [id number] Patient Account Number: [id number] Date of Birth/Sex: 04/10/1951 (66 y.o. Male) Treating RN: Montey Hora Primary Care Deaunna Olarte: Alma Friendly Other Clinician: Referring Jawuan Robb: Alma Friendly Treating Pearlene Teat/Extender: Frann Rider in Treatment: 1 Encounter Discharge Information Items Discharge Pain Level: 0 Discharge Condition: Stable Ambulatory Status: Ambulatory Discharge Destination: Home Transportation: Private Auto Accompanied By: spouse Schedule Follow-up  Appointment: Yes Medication Reconciliation completed and No provided to Patient/Care Jedd Schulenburg: Provided on Clinical Summary of Care: 05/10/2017 Form Type Recipient Paper Patient JB Electronic Signature(s) Signed: 05/10/2017 1:18:00 PM By: Montey Hora Entered By: Montey Hora on 05/10/2017 13:18:00 [name] ([id number]) -------------------------------------------------------------------------------- Lower Extremity Assessment Details Patient Name: [name]. Date of Service: 05/10/2017 9:15 AM Medical Record Number: [id number] Patient Account Number: [id number] Date of Birth/Sex: [birth year] (66 y.o. Male) Treating RN: Montey Hora Primary Care Abdoul Encinas: Alma Friendly Other Clinician: Referring Devlyn Parish: Alma Friendly Treating Aloise Copus/Extender: Frann Rider in Treatment: 1 Vascular Assessment Pulses: Dorsalis Pedis Palpable: [Right:Yes] Posterior Tibial Extremity colors, hair growth, and conditions: Extremity Color: [Right:Normal] Hair Growth on Extremity: [Right:Yes] Temperature of Extremity: [Right:Warm] Capillary Refill: [Right:< 3 seconds] Electronic Signature(s) Signed: 05/11/2017 4:32:17 PM By: Montey Hora Entered By: Montey Hora on 05/10/2017 09:19:14 [name] ([id number]) -------------------------------------------------------------------------------- Multi Wound Chart Details Patient Name: [name]. Date of Service: 05/10/2017 9:15 AM Medical Record Number: [id number] Patient Account Number: [id number] Date of Birth/Sex: Sep 01, 1950 (67 y.o. Male) Treating RN: Montey Hora Primary Care Mackena Plummer: Alma Friendly Other Clinician: Referring Hanalei Glace: Alma Friendly Treating Karimah Winquist/Extender: Frann Rider in Treatment: 1 Vital Signs Height(in): 70 Pulse(bpm): 52 Weight(lbs): 302 Blood Pressure(mmHg): 158/74 Body Mass Index(BMI): 43 Temperature(F): 98.3 Respiratory Rate 18 (breaths/min): Photos:  [1:No Photos] [N/A:N/A] Wound Location: [1:Right Lower Leg - Posterior] [N/A:N/A] Wounding Event: [1:Trauma] [N/A:N/A] Primary Etiology: [1:Trauma, Other] [N/A:N/A] Comorbid History: [1:Hypertension, Type II Diabetes, Osteoarthritis] [N/A:N/A] Date Acquired: [1:04/06/2017] [N/A:N/A] Weeks of Treatment: [1:1] [N/A:N/A] Wound Status: [1:Open] [N/A:N/A] Measurements L x W x D [1:2.4x0.8x0.1] [N/A:N/A] (cm) Area (cm) : [1:1.508] [N/A:N/A] Volume (cm) : [1:0.151] [N/A:N/A] % Reduction in Area: [1:41.80%] [N/A:N/A] % Reduction in Volume: [1:41.70%] [N/A:N/A] Classification: [1:Full Thickness Without Exposed Support Structures] [N/A:N/A] Exudate Amount: [1:Large] [N/A:N/A] Exudate Type: [1:Serous] [N/A:N/A] Exudate Color: [1:amber] [N/A:N/A] Wound Margin: [1:Flat and Intact] [N/A:N/A] Granulation Amount: [1:Small (1-33%)] [N/A:N/A] Granulation Quality: [1:Pink] [N/A:N/A] Necrotic Amount: [1:Large (67-100%)] [N/A:N/A] Necrotic Tissue: [1:Eschar, Adherent Slough] [N/A:N/A] Exposed Structures: [1:Fat Layer (Subcutaneous  Tissue) Exposed: Yes Fascia: No Tendon: No Muscle: No Joint: No Bone: No] [N/A:N/A] Epithelialization: [1:None] [N/A:N/A] Periwound Skin Texture: [1:Excoriation: No Induration: No Callus: No Crepitus: No] [N/A:N/A] Rash: No Scarring: No Periwound Skin Moisture: Maceration: No N/A N/A Dry/Scaly: No Periwound Skin Color: Atrophie Blanche: No N/A N/A Cyanosis: No Ecchymosis: No Erythema: No Hemosiderin Staining: No Mottled: No Pallor: No Rubor: No Temperature: No Abnormality N/A N/A Tenderness on Palpation: Yes N/A N/A Wound Preparation: Ulcer Cleansing: N/A N/A Rinsed/Irrigated with Saline Topical Anesthetic Applied: Other: lidocaine 4% Treatment Notes Electronic Signature(s) Signed: 05/11/2017 4:32:17 PM By: Montey Hora Entered By: Montey Hora on 05/10/2017 09:32:30 [name], [name]  ([id number]) -------------------------------------------------------------------------------- [place] Details Patient Name: [name], DEMAS. Date of Service: 05/10/2017 9:15 AM Medical Record Number: [id number] Patient Account Number: [id number] Date of Birth/Sex: Jul 12, 1951 (66 y.o. Male) Treating RN: Montey Hora Primary Care Koron Godeaux: Alma Friendly Other Clinician: Referring Neave Lenger: Alma Friendly Treating Tanasia Budzinski/Extender: Frann Rider in Treatment: 1 Active Inactive ` Nutrition Nursing Diagnoses: Impaired glucose control: actual or potential Goals: Patient/caregiver verbalizes understanding of need to maintain therapeutic glucose control per primary care physician Date Initiated: 04/27/2017 Target Resolution Date: 07/04/2017 Goal Status: Active Interventions: Assess HgA1c results as ordered upon admission and as needed Provide education on elevated blood sugars and impact on wound healing Notes: ` Orientation to the Wound Care Program Nursing Diagnoses: Knowledge deficit related to the wound healing center program Goals: Patient/caregiver will verbalize understanding of the [facility] [place] Date Initiated: 04/27/2017 Target Resolution Date: 07/20/2017 Goal Status: Active Interventions: Provide education on orientation to the wound center Notes: ` Wound/Skin Impairment Nursing Diagnoses: Impaired tissue integrity Goals: Ulcer/skin breakdown will heal within 14 weeks Date Initiated: 04/27/2017 Target Resolution Date: 07/20/2017 Goal Status: Active AKSHAR, STARNES ([id number]) Interventions: Assess patient/caregiver ability to obtain necessary supplies Assess patient/caregiver ability to perform ulcer/skin care regimen upon admission and as needed Assess ulceration(s) every visit Notes: Electronic Signature(s) Signed: 05/11/2017 4:32:17 PM By: Montey Hora Entered By: Montey Hora on 05/10/2017 09:31:37 [name] ([id number]) -------------------------------------------------------------------------------- Pain Assessment Details Patient Name: [name]. Date of Service: 05/10/2017 9:15 AM Medical Record Number: [id number] Patient Account Number: [id number] Date of Birth/Sex: 1951/01/24 (66 y.o. Male) Treating RN: Montey Hora Primary Care Tommy Minichiello: Alma Friendly Other Clinician: Referring Nicey Krah: Alma Friendly Treating Sophiana Milanese/Extender: Frann Rider in Treatment: 1 Active Problems Location of Pain Severity and Description of Pain Patient Has Paino No Site Locations Pain Management and Medication Current Pain Management: Notes Topical or injectable lidocaine is offered to patient for acute pain when surgical debridement is performed. If needed, Patient is instructed to use over the counter pain medication for the following 24-48 hours after debridement. Wound care MDs do not prescribed pain medications. Patient has chronic pain or uncontrolled pain. Patient has been instructed to make an appointment with their Primary Care Physician for pain management. Electronic Signature(s) Signed: 05/11/2017 4:32:17 PM By: Montey Hora Entered By: Montey Hora on 05/10/2017 09:09:43 [name] ([id number]) -------------------------------------------------------------------------------- Patient/Caregiver Education Details Patient Name: [name], FLANNERY. Date of Service: 05/10/2017 9:15 AM Medical Record Number: [id number] Patient Account Number: [id number] Date of Birth/Gender: September 14, 1950 (66 y.o. Male) Treating RN: Montey Hora Primary Care Physician: Alma Friendly Other Clinician: Referring Physician: Alma Friendly Treating Physician/Extender: Frann Rider in Treatment: 1 Education Assessment Education Provided To: Patient and Caregiver Education Topics Provided Wound/Skin Impairment: Handouts: Other: wound care as ordered Methods: Demonstration,  Explain/Verbal Responses: State content correctly Electronic Signature(s) Signed:  05/11/2017 4:32:17 PM By: Montey Hora Entered By: Montey Hora on 05/10/2017 13:18:32 [name] ([id number]) -------------------------------------------------------------------------------- Wound Assessment Details Patient Name: [name], [name]. Date of Service: 05/10/2017 9:15 AM Medical Record Number: [id number] Patient Account Number: [id number] Date of Birth/Sex: [birth year] (66 y.o. Male) Treating RN: Montey Hora Primary Care Reino Lybbert: Alma Friendly Other Clinician: Referring Yochanan Eddleman: Alma Friendly Treating Lizvet Chunn/Extender: Frann Rider in Treatment: 1 Wound Status Wound Number: 1 Primary Etiology: Trauma, Other Wound Location: Right Lower Leg - Posterior Wound Status: Open Wounding Event: Trauma Comorbid Hypertension, Type II Diabetes, History: Osteoarthritis Date Acquired: 04/06/2017 Weeks Of Treatment: 1 Clustered Wound: No Photos Photo Uploaded By: Montey Hora on 05/10/2017 16:21:11 Wound Measurements Length: (cm) 2.4 Width: (cm) 0.8 Depth: (cm) 0.1 Area: (cm) 1.508 Volume: (cm) 0.151 % Reduction in Area: 41.8% % Reduction in Volume: 41.7% Epithelialization: None Tunneling: No Undermining: No Wound Description Full Thickness Without Exposed Support Foul O Classification: Structures Slough Wound Margin: Flat and Intact Exudate Large Amount: Exudate Type: Serous Exudate Color: amber dor After Cleansing: No /Fibrino Yes Wound Bed Granulation Amount: Small (1-33%) Exposed Structure Granulation Quality: Pink Fascia Exposed: No Necrotic Amount: Large (67-100%) Fat Layer (Subcutaneous Tissue) Exposed: Yes Necrotic Quality: Eschar, Adherent Slough Tendon Exposed: No Muscle Exposed: No Joint Exposed: No Bone Exposed: No [name], [name]. ([id number]) Periwound Skin Texture Texture Color No Abnormalities Noted: No No Abnormalities Noted:  No Callus: No Atrophie Blanche: No Crepitus: No Cyanosis: No Excoriation: No Ecchymosis: No Induration: No Erythema: No Rash: No Hemosiderin Staining: No Scarring: No Mottled: No Pallor: No Moisture Rubor: No No Abnormalities Noted: No Dry / Scaly: No Temperature / Pain Maceration: No Temperature: No Abnormality Tenderness on Palpation: Yes Wound Preparation Ulcer Cleansing: Rinsed/Irrigated with Saline Topical Anesthetic Applied: Other: lidocaine 4%, Treatment Notes Wound #1 (Right, Posterior Lower Leg) 1. Cleansed with: Clean wound with Normal Saline 2. Anesthetic Topical Lidocaine 4% cream to wound bed prior to debridement 4. Dressing Applied: Santyl Ointment 5. Secondary Dressing Applied Bordered Foam Dressing Dry Gauze Electronic Signature(s) Signed: 05/11/2017 4:32:17 PM By: Montey Hora Entered By: Montey Hora on 05/10/2017 09:18:54 [name], [name] ([id number]) -------------------------------------------------------------------------------- [place] Details Patient Name: [name]. Date of Service: 05/10/2017 9:15 AM Medical Record Number: [id number] Patient Account Number: [id number] Date of Birth/Sex: 01-17-1951 (66 y.o. Male) Treating RN: Montey Hora Primary Care Duel Conrad: Alma Friendly Other Clinician: Referring Nadezhda Pollitt: Alma Friendly Treating Ava Deguire/Extender: Frann Rider in Treatment: 1 Vital Signs Time Taken: 09:09 Temperature (F): 98.3 Height (in): 70 Pulse (bpm): 81 Weight (lbs): 302 Respiratory Rate (breaths/min): 18 Body Mass Index (BMI): 43.3 Blood Pressure (mmHg): 158/74 Reference Range: 80 - 120 mg / dl Electronic Signature(s) Signed: 05/11/2017 4:32:17 PM By: Montey Hora Entered By: Montey Hora on 05/10/2017 09:11:35

## 2017-05-17 ENCOUNTER — Encounter: Payer: BLUE CROSS/BLUE SHIELD | Attending: Surgery | Admitting: Surgery

## 2017-05-17 DIAGNOSIS — E11622 Type 2 diabetes mellitus with other skin ulcer: Secondary | ICD-10-CM | POA: Diagnosis not present

## 2017-05-17 DIAGNOSIS — L97212 Non-pressure chronic ulcer of right calf with fat layer exposed: Secondary | ICD-10-CM | POA: Diagnosis not present

## 2017-05-17 DIAGNOSIS — X58XXXA Exposure to other specified factors, initial encounter: Secondary | ICD-10-CM | POA: Diagnosis not present

## 2017-05-17 DIAGNOSIS — Z87891 Personal history of nicotine dependence: Secondary | ICD-10-CM | POA: Diagnosis not present

## 2017-05-17 DIAGNOSIS — M1990 Unspecified osteoarthritis, unspecified site: Secondary | ICD-10-CM | POA: Insufficient documentation

## 2017-05-17 DIAGNOSIS — S81811A Laceration without foreign body, right lower leg, initial encounter: Secondary | ICD-10-CM | POA: Diagnosis not present

## 2017-05-17 DIAGNOSIS — I10 Essential (primary) hypertension: Secondary | ICD-10-CM | POA: Diagnosis not present

## 2017-05-17 DIAGNOSIS — Z6841 Body Mass Index (BMI) 40.0 and over, adult: Secondary | ICD-10-CM | POA: Insufficient documentation

## 2017-05-19 NOTE — Progress Notes (Addendum)
COLBIN, JOVEL ([id number]) Visit Report for 05/17/2017 Chief Complaint Document Details Patient Name: [name], [name]. Date of Service: 05/17/2017 2:30 PM Medical Record Number: [id number] Patient Account Number: [id number] Date of Birth/Sex: 1951/06/10 (66 y.o. Male) Treating RN: Montey Hora Primary Care Provider: Alma Friendly Other Clinician: Referring Provider: Alma Friendly Treating Provider/Extender: Frann Rider in Treatment: 2 Information Obtained from: Patient Chief Complaint Patient seen for complaints of Non-Healing Wound to the right calf which he sustained about 3 weeks ago Electronic Signature(s) Signed: 05/17/2017 3:07:04 PM By: Christin Fudge MD, FACS Entered By: Christin Fudge on 05/17/2017 15:07:04 [name] ([id number]) -------------------------------------------------------------------------------- Debridement Details Patient Name: [name]. Date of Service: 05/17/2017 2:30 PM Medical Record Number: [id number] Patient Account Number: [id number] Date of Birth/Sex: 14-May-1951 (66 y.o. Male) Treating RN: Montey Hora Primary Care Provider: Alma Friendly Other Clinician: Referring Provider: Alma Friendly Treating Provider/Extender: Frann Rider in Treatment: 2 Debridement Performed for Wound #1 Right,Posterior Lower Leg Assessment: Performed By: Physician Christin Fudge, MD Debridement: Debridement Pre-procedure Verification/Time Yes - 14:50 Out Taken: Start Time: 14:51 Pain Control: Lidocaine 4% Topical Solution Level: Skin/Subcutaneous Tissue Total Area Debrided (L x W): 2.5 (cm) x 0.7 (cm) = 1.75 (cm) Tissue and other material Viable, Non-Viable, Exudate, Fibrin/Slough, Subcutaneous debrided: Instrument: Curette Bleeding: Minimum Hemostasis Achieved: Pressure End Time: 14:52 Procedural Pain: 0 Post Procedural Pain: 0 Response to Treatment: Procedure was tolerated well Post Debridement Measurements of Total  Wound Length: (cm) 2.5 Width: (cm) 0.7 Depth: (cm) 0.2 Volume: (cm) 0.275 Character of Wound/Ulcer Post Debridement: Requires Further Debridement Post Procedure Diagnosis Same as Pre-procedure Electronic Signature(s) Signed: 05/17/2017 3:06:51 PM By: Christin Fudge MD, FACS Signed: 05/17/2017 4:49:35 PM By: Montey Hora Entered By: Christin Fudge on 05/17/2017 15:06:51 [name] ([id number]) -------------------------------------------------------------------------------- HPI Details Patient Name: [name]. Date of Service: 05/17/2017 2:30 PM Medical Record Number: [id number] Patient Account Number: [id number] Date of Birth/Sex: 1951/02/21 (66 y.o. Male) Treating RN: Montey Hora Primary Care Provider: Alma Friendly Other Clinician: Referring Provider: Alma Friendly Treating Provider/Extender: Frann Rider in Treatment: 2 History of Present Illness Location: right calf Quality: Patient reports experiencing a dull pain to affected area(s). Severity: Patient states wound are getting worse. Duration: Patient has had the wound for < 4 weeks prior to presenting for treatment Timing: Pain in wound is Intermittent (comes and goes Context: The wound occurred when the patient had a laceration with a tractor Modifying Factors: Other treatment(s) tried include:oral antibiotics and Bactroban ointment Associated Signs and Symptoms: Patient reports having increase discharge. HPI Description: 66 year old male with a history of diabetes mellitus type 2 has been seeing his PCP for lower extremity laceration and was initially seen on September 20. 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 The nurse practitioner at the practice was concerned about her venous stasis ulcer and referred him to the wound center. He is a former smoker who quit in 2016 and has a past medical history of  arthritis, bronchitis, colon polyps, diabetes mellitus and hypertension Last hemoglobin A1c about a year ago was 7.5%. He says he's had a more recent hemoglobin A1c done and it was about 7.2% 05/03/2017 -- he was unable to get Santyl through his insurance and hence he's been using Medihoney. Electronic Signature(s) Signed: 05/17/2017 3:07:26 PM By: Christin Fudge MD, FACS Entered By: Christin Fudge on 05/17/2017 15:07:26 [name] ([id number]) -------------------------------------------------------------------------------- Physical Exam  Details Patient Name: [name], [name]. Date of Service: 05/17/2017 2:30 PM Medical Record Number: [id number] Patient Account Number: [id number] Date of Birth/Sex: Nov 27, 1950 (66 y.o. Male) Treating RN: Montey Hora Primary Care Provider: Alma Friendly Other Clinician: Referring Provider: Alma Friendly Treating Provider/Extender: Frann Rider in Treatment: 2 Constitutional . Pulse regular. Respirations normal and unlabored. Afebrile. . Eyes Nonicteric. Reactive to light. Ears, Nose, Mouth, and Throat Lips, teeth, and gums WNL.Marland Kitchen Moist mucosa without lesions. Neck supple and nontender. No palpable supraclavicular or cervical adenopathy. Normal sized without goiter. Respiratory WNL. No retractions.. Cardiovascular Pedal Pulses WNL. No clubbing, cyanosis or edema. Genitourinary (GU) No hydrocele, spermatocele, tenderness of the cord, or testicular mass.Marland Kitchen Penis without lesions.Lowella Fairy without lesions. No cystocele, or rectocele. Pelvic support intact, no discharge.Marland Kitchen Urethra without masses, tenderness or scarring.Marland Kitchen Lymphatic No adneopathy. No adenopathy. No adenopathy. Musculoskeletal Adexa without tenderness or enlargement.. Digits and nails w/o clubbing, cyanosis, infection, petechiae, ischemia, or inflammatory conditions.. Integumentary (Hair, Skin) No suspicious lesions. No crepitus or fluctuance. No peri-wound warmth or erythema.  No masses.Marland Kitchen Psychiatric Judgement and insight Intact.. No evidence of depression, anxiety, or agitation.. Notes the wound continues to need sharp debridement with #3 curet and this was done with brisk bleeding which was controlled with pressure Electronic Signature(s) Signed: 05/24/2017 11:15:16 AM By: Christin Fudge MD, FACS Previous Signature: 05/17/2017 3:08:16 PM Version By: Christin Fudge MD, FACS Entered By: Christin Fudge on 05/24/2017 11:15:14 [name] ([id number]) -------------------------------------------------------------------------------- Physician Orders Details Patient Name: [name], [name]. Date of Service: 05/17/2017 2:30 PM Medical Record Number: [id number] Patient Account Number: [id number] Date of Birth/Sex: 08-16-1950 (65 y.o. Male) Treating RN: Ahmed Prima Primary Care Provider: Alma Friendly Other Clinician: Referring Provider: Alma Friendly Treating Provider/Extender: Frann Rider in Treatment: 2 Verbal / Phone Orders: Yes ClinicianCarolyne Fiscal, Debi Read Back and Verified: Yes Diagnosis Coding Wound Cleansing Wound #1 Right,Posterior Lower Leg o Clean wound with Normal Saline. o May Shower, gently pat wound dry prior to applying new dressing. Anesthetic Wound #1 Right,Posterior Lower Leg o Topical Lidocaine 4% cream applied to wound bed prior to debridement Primary Wound Dressing Wound #1 Right,Posterior Lower Leg o Santyl Ointment - in clinic o Medihoney gel - at home Secondary Dressing Wound #1 Right,Posterior Lower Leg o Boardered Foam Dressing - or telfa island dressing Dressing Change Frequency Wound #1 Right,Posterior Lower Leg o Change dressing every day. Follow-up Appointments Wound #1 Right,Posterior Lower Leg o Return Appointment in 1 week. Edema Control Wound #1 Right,Posterior Lower Leg o Elevate legs to the level of the heart and pump ankles as often as possible Additional Orders /  Instructions Wound #1 Right,Posterior Lower Leg o Increase protein intake. o Other: - Try to keep blood sugars below 180 and please add vitamin A, vitamin C and zinc supplements to your diet Medications-please add to medication list. Wound #1 Right,Posterior Lower Leg o [address] MUHAMED, LUECKE ([id number]) Electronic Signature(s) Signed: 05/17/2017 4:34:23 PM By: Christin Fudge MD, FACS Signed: 05/17/2017 4:53:27 PM By: Alric Quan Entered By: Alric Quan on 05/17/2017 14:54:12 [name], [name] ([id number]) -------------------------------------------------------------------------------- Problem List Details Patient Name: [name], [name]. Date of Service: 05/17/2017 2:30 PM Medical Record Number: [id number] Patient Account Number: [id number] Date of Birth/Sex: Jun 06, 1951 (66 y.o. Male) Treating RN: Montey Hora Primary Care Provider: Alma Friendly Other Clinician: Referring Provider: Alma Friendly Treating Provider/Extender: Frann Rider in Treatment: 2 Active Problems ICD-10 Encounter Code Description Active Date Diagnosis E11.622 Type 2 diabetes mellitus with other skin  ulcer 04/27/2017 Yes S81.811A Laceration without foreign body, right lower leg, initial encounter 04/27/2017 Yes L97.212 Non-pressure chronic ulcer of right calf with fat layer exposed 04/27/2017 Yes E66.01 Morbid (severe) obesity due to excess calories 04/27/2017 Yes Inactive Problems Resolved Problems Electronic Signature(s) Signed: 05/17/2017 3:06:35 PM By: Christin Fudge MD, FACS Entered By: Christin Fudge on 05/17/2017 15:06:35 [name] ([id number]) -------------------------------------------------------------------------------- Progress Note Details Patient Name: [name]. Date of Service: 05/17/2017 2:30 PM Medical Record Number: [id number] Patient Account Number: [id number] Date of Birth/Sex: [birth year] (66 y.o. Male) Treating RN: Montey Hora Primary  Care Provider: Alma Friendly Other Clinician: Referring Provider: Alma Friendly Treating Provider/Extender: Frann Rider in Treatment: 2 Subjective Chief Complaint Information obtained from Patient Patient seen for complaints of Non-Healing Wound to the right calf which he sustained about 3 weeks ago History of Present Illness (HPI) The following HPI elements were documented for the patient's wound: Location: right calf Quality: Patient reports experiencing a dull pain to affected area(s). Severity: Patient states wound are getting worse. Duration: Patient has had the wound for < 4 weeks prior to presenting for treatment Timing: Pain in wound is Intermittent (comes and goes Context: The wound occurred when the patient had a laceration with a tractor Modifying Factors: Other treatment(s) tried include:oral antibiotics and Bactroban ointment Associated Signs and Symptoms: Patient reports having increase discharge. 66 year old male with a history of diabetes mellitus type 2 has been seeing his PCP for lower extremity laceration and was initially seen on September 20. 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 The nurse practitioner at the practice was concerned about her venous stasis ulcer and referred him to the wound center. He is a former smoker who quit in 2016 and has a past medical history of arthritis, bronchitis, colon polyps, diabetes mellitus and hypertension Last hemoglobin A1c about a year ago was 7.5%. He says he's had a more recent hemoglobin A1c done and it was about 7.2% 05/03/2017 -- he was unable to get Santyl through his insurance and hence he's been using Medihoney. Patient History Information obtained from Patient. Family History Diabetes - Mother, Stroke - Mother,Father, No family history of Cancer, Heart Disease, Hereditary Spherocytosis, Hypertension, Kidney  Disease, Lung Disease, Seizures, Thyroid Problems, Tuberculosis. Social History Former smoker - 2+ years ago, Marital Status - Married, Alcohol Use - Never, Drug Use - No History, Caffeine Use - Daily. Medical And Surgical History Notes Respiratory bronchitis [name], [name]. ([id number]) Objective Constitutional Pulse regular. Respirations normal and unlabored. Afebrile. Vitals Time Taken: 2:34 AM, Height: 70 in, Weight: 302 lbs, BMI: 43.3, Temperature: 98.6 F, Pulse: 96 bpm, Respiratory Rate: 20 breaths/min, Blood Pressure: 177/84 mmHg. Eyes Nonicteric. Reactive to light. Ears, Nose, Mouth, and Throat Lips, teeth, and gums WNL.Marland Kitchen Moist mucosa without lesions. Neck supple and nontender. No palpable supraclavicular or cervical adenopathy. Normal sized without goiter. Respiratory WNL. No retractions.. Cardiovascular Pedal Pulses WNL. No clubbing, cyanosis or edema. Genitourinary (GU) No hydrocele, spermatocele, tenderness of the cord, or testicular mass.Marland Kitchen Penis without lesions.Lowella Fairy without lesions. No cystocele, or rectocele. Pelvic support intact, no discharge.Marland Kitchen Urethra without masses, tenderness or scarring.Marland Kitchen Lymphatic No adneopathy. No adenopathy. No adenopathy. Musculoskeletal Adexa without tenderness or enlargement.. Digits and nails w/o clubbing, cyanosis, infection, petechiae, ischemia, or inflammatory conditions.Marland Kitchen Psychiatric Judgement and insight Intact.. No evidence of depression, anxiety, or agitation.. General Notes: the wound continues to need sharp debridement with #  3 curet and this was done with brisk bleeding which was controlled with pressure Integumentary (Hair, Skin) No suspicious lesions. No crepitus or fluctuance. No peri-wound warmth or erythema. No masses.. Wound #1 status is Open. Original cause of wound was Trauma. The wound is located on the Right,Posterior Lower Leg. The wound measures 2.5cm length x 0.7cm width x 0.1cm depth; 1.374cm^2 area and  0.137cm^3 volume. There is Fat Layer (Subcutaneous Tissue) Exposed exposed. There is no tunneling or undermining noted. There is a large amount of serous drainage noted. The wound margin is flat and intact. There is small (1-33%) pink granulation within the wound bed. There is a large (67-100%) amount of necrotic tissue within the wound bed including Adherent Slough. The periwound skin appearance exhibited: Erythema. The periwound skin appearance did not exhibit: Callus, Crepitus, Excoriation, Induration, Rash, Scarring, Dry/Scaly, Maceration, Atrophie Blanche, Cyanosis, Ecchymosis, Hemosiderin Staining, Mottled, Pallor, Rubor. The surrounding wound skin color is noted with erythema which is circumferential. Periwound temperature was noted as No [name], [name]. ([id number]) Abnormality. The periwound has tenderness on palpation. Assessment Active Problems ICD-10 E11.622 - Type 2 diabetes mellitus with other skin ulcer S81.811A - Laceration without foreign body, right lower leg, initial encounter L97.212 - Non-pressure chronic ulcer of right calf with fat layer exposed E66.01 - Morbid (severe) obesity due to excess calories Procedures Wound #1 Pre-procedure diagnosis of Wound #1 is a Trauma, Other located on the Right,Posterior Lower Leg . There was a Skin/Subcutaneous Tissue Debridement ([id number]) debridement with total area of 1.75 sq cm performed by Christin Fudge, MD. with the following instrument(s): Curette to remove Viable and Non-Viable tissue/material including Exudate, Fibrin/Slough, and Subcutaneous after achieving pain control using Lidocaine 4% Topical Solution. A time out was conducted at 14:50, prior to the start of the procedure. A Minimum amount of bleeding was controlled with Pressure. The procedure was tolerated well with a pain level of 0 throughout and a pain level of 0 following the procedure. Post Debridement Measurements: 2.5cm length x 0.7cm width x 0.2cm depth;  0.275cm^3 volume. Character of Wound/Ulcer Post Debridement requires further debridement. Post procedure Diagnosis Wound #1: Same as Pre-Procedure Plan Wound Cleansing: Wound #1 Right,Posterior Lower Leg: Clean wound with Normal Saline. May Shower, gently pat wound dry prior to applying new dressing. Anesthetic: Wound #1 Right,Posterior Lower Leg: Topical Lidocaine 4% cream applied to wound bed prior to debridement Primary Wound Dressing: Wound #1 Right,Posterior Lower Leg: Santyl Ointment - in clinic Medihoney gel - at home Secondary Dressing: Wound #1 Right,Posterior Lower Leg: Boardered Foam Dressing - or telfa island dressing Dressing Change Frequency: Wound #1 Right,Posterior Lower Leg: Change dressing every day. Follow-up Appointments: [name], [name] ([id number]) Wound #1 Right,Posterior Lower Leg: Return Appointment in 1 week. Edema Control: Wound #1 Right,Posterior Lower Leg: Elevate legs to the level of the heart and pump ankles as often as possible Additional Orders / Instructions: Wound #1 Right,Posterior Lower Leg: Increase protein intake. Other: - Try to keep blood sugars below 180 and please add vitamin A, vitamin C and zinc supplements to your diet Medications-please add to medication list.: Wound #1 Right,Posterior Lower Leg: Santyl Enzymatic Ointment after sharp debridement and review I have recommended: 1. Medihoney ointment locally, to be applied daily after washing with soap and water 2. Good control of his diabetes mellitus 3. Adequate protein, vitamin A, vitamin C and zinc 4. Regular visits to the wound center Electronic Signature(s) Signed: 05/24/2017 11:16:02 AM By: Christin Fudge MD, FACS Previous Signature: 05/17/2017 3:08:46 PM  Version By: Christin Fudge MD, FACS Entered By: Christin Fudge on 05/24/2017 11:16:02 [name] ([id number]) -------------------------------------------------------------------------------- ROS/PFSH Details Patient  Name: [name], [name]. Date of Service: 05/17/2017 2:30 PM Medical Record Number: [id number] Patient Account Number: [id number] Date of Birth/Sex: 10/26/1950 (66 y.o. Male) Treating RN: Montey Hora Primary Care Provider: Alma Friendly Other Clinician: Referring Provider: Alma Friendly Treating Provider/Extender: Frann Rider in Treatment: 2 Information Obtained From Patient Wound History Do you currently have one or more open woundso Yes How many open wounds do you currently haveo 1 Approximately how long have you had your woundso 3 weeks How have you been treating your wound(s) until nowo dry dressing Has your wound(s) ever healed and then re-openedo No Have you had any lab work done in the past montho No Have you tested positive for an antibiotic resistant organism (MRSA, VRE)o No Have you tested positive for osteomyelitis (bone infection)o No Have you had any tests for circulation on your legso No Have you had other problems associated with your woundso Infection Respiratory Medical History: Past Medical History Notes: bronchitis Cardiovascular Medical History: Positive for: Hypertension Endocrine Medical History: Positive for: Type II Diabetes Time with diabetes: since 1997 Treated with: Insulin Blood sugar tested every day: Yes Tested : Musculoskeletal Medical History: Positive for: Osteoarthritis Immunizations Pneumococcal Vaccine: Received Pneumococcal Vaccination: Yes Immunization Notes: up to date Implantable Devices Family and Social History [name], [name] ([id number]) Cancer: No; Diabetes: Yes - Mother; Heart Disease: No; Hereditary Spherocytosis: No; Hypertension: No; Kidney Disease: No; Lung Disease: No; Seizures: No; Stroke: Yes - Mother,Father; Thyroid Problems: No; Tuberculosis: No; Former smoker - 2+ years ago; Marital Status - Married; Alcohol Use: Never; Drug Use: No History; Caffeine Use: Daily; Financial Concerns: No; Food, Clothing or  Shelter Needs: No; Support System Lacking: No; Transportation Concerns: No; Advanced Directives: No; Patient does not want information on Advanced Directives Physician Affirmation I have reviewed and agree with the above information. Electronic Signature(s) Signed: 05/17/2017 4:34:23 PM By: Christin Fudge MD, FACS Signed: 05/17/2017 4:49:35 PM By: Montey Hora Entered By: Christin Fudge on 05/17/2017 15:07:41 [name], [name] ([id number]) -------------------------------------------------------------------------------- SuperBill Details Patient Name: KATE, SWEETMAN. Date of Service: 05/17/2017 Medical Record Number: [id number] Patient Account Number: [id number] Date of Birth/Sex: 01/29/1951 (66 y.o. Male) Treating RN: Montey Hora Primary Care Provider: Alma Friendly Other Clinician: Referring Provider: Alma Friendly Treating Provider/Extender: Frann Rider in Treatment: 2 Diagnosis Coding ICD-10 Codes Code Description [phone] Type 2 diabetes mellitus with other skin ulcer S81.811A Laceration without foreign body, right lower leg, initial encounter L97.212 Non-pressure chronic ulcer of right calf with fat layer exposed E66.01 Morbid (severe) obesity due to excess calories Facility Procedures CPT4 Code: [id number] Description: 02542 - DEB SUBQ TISSUE 20 SQ CM/< ICD-10 Diagnosis Description E11.622 Type 2 diabetes mellitus with other skin ulcer S81.811A Laceration without foreign body, right lower leg, initial enc L97.212 Non-pressure chronic ulcer of right calf  with fat layer expos E66.01 Morbid (severe) obesity due to excess calories Modifier: ounter ed Quantity: 1 Physician Procedures CPT4 Code: 7062376 Description: 28315 - WC PHYS SUBQ TISS 20 SQ CM ICD-10 Diagnosis Description E11.622 Type 2 diabetes mellitus with other skin ulcer S81.811A Laceration without foreign body, right lower leg, initial enc L97.212 Non-pressure chronic ulcer of right calf  with fat layer expos  E66.01 Morbid (severe) obesity due to excess calories Modifier: ounter ed Quantity: 1 Electronic Signature(s) Signed: 05/17/2017 3:08:59 PM By: Christin Fudge MD, FACS Entered By: Christin Fudge on 05/17/2017 15:08:59

## 2017-05-21 NOTE — Progress Notes (Signed)
[name], [name] ([id number]) Visit Report for 05/17/2017 Arrival Information Details Patient Name: [name], [name]. Date of Service: 05/17/2017 2:30 PM Medical Record Number: [id number] Patient Account Number: [id number] Date of Birth/Sex: [birth year] (66 y.o. Male) Treating RN: Roger Shelter Primary Care Faithann Natal: Alma Friendly Other Clinician: Referring Delisia Mcquiston: Alma Friendly Treating Jaylee Lantry/Extender: Frann Rider in Treatment: 2 Visit Information History Since Last Visit Added or deleted any medications: No Patient Arrived: Ambulatory Any new allergies or adverse reactions: No Arrival Time: 14:33 Had a fall or experienced change in No Accompanied By: wife activities of daily living that may affect Transfer Assistance: None risk of falls: Patient Has Alerts: Yes Hospitalized since last visit: No Patient Alerts: DMII Pain Present Now: No Electronic Signature(s) Signed: 05/18/2017 9:46:34 AM By: Roger Shelter Entered By: Roger Shelter on 05/17/2017 14:34:33 [name] ([id number]) -------------------------------------------------------------------------------- Clinic Level of Care Assessment Details Patient Name: [name]. Date of Service: 05/17/2017 2:30 PM Medical Record Number: [id number] Patient Account Number: [id number] Date of Birth/Sex: April 24, 1951 (66 y.o. Male) Treating RN: Roger Shelter Primary Care Gionni Vaca: Alma Friendly Other Clinician: Referring Taijon Vink: Alma Friendly Treating Anayi Bricco/Extender: Frann Rider in Treatment: 2 Clinic Level of Care Assessment Items TOOL 1 Quantity Score []  - Use when EandM and Procedure is performed on INITIAL visit 0 ASSESSMENTS - Nursing Assessment / Reassessment []  - General Physical Exam (combine w/ comprehensive assessment (listed just below) when 0 performed on new pt. evals) []  - 0 Comprehensive Assessment (HX, ROS, Risk Assessments, Wounds Hx, etc.) ASSESSMENTS - Wound and Skin  Assessment / Reassessment []  - Dermatologic / Skin Assessment (not related to wound area) 0 ASSESSMENTS - Ostomy and/or Continence Assessment and Care []  - Incontinence Assessment and Management 0 []  - 0 Ostomy Care Assessment and Management (repouching, etc.) PROCESS - Coordination of Care []  - Simple Patient / Family Education for ongoing care 0 []  - 0 Complex (extensive) Patient / Family Education for ongoing care []  - 0 Staff obtains Programmer, systems, Records, Test Results / Process Orders []  - 0 Staff telephones HHA, Nursing Homes / Clarify orders / etc []  - 0 Routine Transfer to another Facility (non-emergent condition) []  - 0 Routine Hospital Admission (non-emergent condition) []  - 0 New Admissions / Biomedical engineer / Ordering NPWT, Apligraf, etc. []  - 0 Emergency Hospital Admission (emergent condition) PROCESS - Special Needs []  - Pediatric / Minor Patient Management 0 []  - 0 Isolation Patient Management []  - 0 Hearing / Language / Visual special needs []  - 0 Assessment of Community assistance (transportation, D/C planning, etc.) []  - 0 Additional assistance / Altered mentation []  - 0 Support Surface(s) Assessment (bed, cushion, seat, etc.) [name], [name] ([id number]) INTERVENTIONS - Miscellaneous []  - External ear exam 0 []  - 0 Patient Transfer (multiple staff / Civil Service fast streamer / Similar devices) []  - 0 Simple Staple / Suture removal (25 or less) []  - 0 Complex Staple / Suture removal (26 or more) []  - 0 Hypo/Hyperglycemic Management (do not check if billed separately) []  - 0 Ankle / Brachial Index (ABI) - do not check if billed separately Has the patient been seen at the hospital within the last three years: Yes Total Score: 0 Level Of Care: ____ Electronic Signature(s) Signed: 05/18/2017 9:46:34 AM By: Roger Shelter Entered By: Roger Shelter on 05/17/2017 15:05:21 [name]  ([id number]) -------------------------------------------------------------------------------- Encounter Discharge Information Details Patient Name: [name]. Date of Service: 05/17/2017 2:30 PM Medical Record Number: [id number] Patient Account Number: [id number] Date of Birth/Sex: 1950/08/19 (  66 y.o. Male) Treating RN: Carolyne Fiscal, Debi Primary Care Jhordyn Hoopingarner: Alma Friendly Other Clinician: Referring Karson Chicas: Alma Friendly Treating Iley Breeden/Extender: Frann Rider in Treatment: 2 Encounter Discharge Information Items Discharge Pain Level: 0 Discharge Condition: Stable Ambulatory Status: Ambulatory Discharge Destination: Home Transportation: Private Auto Accompanied By: wife Schedule Follow-up Appointment: Yes Medication Reconciliation completed and No provided to Patient/Care Andrez Lieurance: Provided on Clinical Summary of Care: 05/17/2017 Form Type Recipient Paper Patient JB Electronic Signature(s) Signed: 05/21/2017 10:06:49 AM By: Ruthine Dose Previous Signature: 05/17/2017 2:48:42 PM Version By: Alric Quan Entered By: Ruthine Dose on 05/17/2017 15:00:05 [name] ([id number]) -------------------------------------------------------------------------------- Lower Extremity Assessment Details Patient Name: [name]. Date of Service: 05/17/2017 2:30 PM Medical Record Number: [id number] Patient Account Number: [id number] Date of Birth/Sex: 06-Apr-1951 (67 y.o. Male) Treating RN: Roger Shelter Primary Care Lossie Kalp: Alma Friendly Other Clinician: Referring Cyril Woodmansee: Alma Friendly Treating Issaiah Seabrooks/Extender: Frann Rider in Treatment: 2 Vascular Assessment Claudication: Claudication Assessment [Right:None] Pulses: Dorsalis Pedis Palpable: [Right:Yes] Posterior Tibial Extremity colors, hair growth, and conditions: Extremity Color: [Right:Normal] Hair Growth on Extremity: [Right:Yes] Temperature of Extremity: [Right:Warm] Capillary  Refill: [Right:< 3 seconds] Toe Nail Assessment Left: Right: Thick: No Discolored: No Deformed: No Improper Length and Hygiene: Yes Electronic Signature(s) Signed: 05/17/2017 3:04:54 PM By: Roger Shelter Entered By: Roger Shelter on 05/17/2017 15:04:54 [name] ([id number]) -------------------------------------------------------------------------------- Multi Wound Chart Details Patient Name: [name]. Date of Service: 05/17/2017 2:30 PM Medical Record Number: [id number] Patient Account Number: [id number] Date of Birth/Sex: 26-Jan-1951 (66 y.o. Male) Treating RN: Ahmed Prima Primary Care Ananya Mccleese: Alma Friendly Other Clinician: Referring Angelyna Henderson: Alma Friendly Treating Cystal Shannahan/Extender: Frann Rider in Treatment: 2 Vital Signs Height(in): 70 Pulse(bpm): 96 Weight(lbs): 302 Blood Pressure(mmHg): 177/84 Body Mass Index(BMI): 43 Temperature(F): 98.6 Respiratory Rate 20 (breaths/min): Photos: [1:No Photos] [N/A:N/A] Wound Location: [1:Right Lower Leg - Posterior] [N/A:N/A] Wounding Event: [1:Trauma] [N/A:N/A] Primary Etiology: [1:Trauma, Other] [N/A:N/A] Comorbid History: [1:Hypertension, Type II Diabetes, Osteoarthritis] [N/A:N/A] Date Acquired: [1:04/06/2017] [N/A:N/A] Weeks of Treatment: [1:2] [N/A:N/A] Wound Status: [1:Open] [N/A:N/A] Measurements L x W x D [1:2.5x0.7x0.1] [N/A:N/A] (cm) Area (cm) : [1:1.374] [N/A:N/A] Volume (cm) : [1:0.137] [N/A:N/A] % Reduction in Area: [1:47.00%] [N/A:N/A] % Reduction in Volume: [1:47.10%] [N/A:N/A] Classification: [1:Full Thickness Without Exposed Support Structures] [N/A:N/A] Exudate Amount: [1:Large] [N/A:N/A] Exudate Type: [1:Serous] [N/A:N/A] Exudate Color: [1:amber] [N/A:N/A] Wound Margin: [1:Flat and Intact] [N/A:N/A] Granulation Amount: [1:Small (1-33%)] [N/A:N/A] Granulation Quality: [1:Pink] [N/A:N/A] Necrotic Amount: [1:Large (67-100%)] [N/A:N/A] Exposed Structures: [1:Fat Layer  (Subcutaneous Tissue) Exposed: Yes Fascia: No Tendon: No Muscle: No Joint: No Bone: No] [N/A:N/A] Epithelialization: [1:None] [N/A:N/A] Debridement: [1:Debridement (11042-11047)] [N/A:N/A] Pre-procedure [1:14:50] [N/A:N/A] Verification/Time Out Taken: Pain Control: [1:Lidocaine 4% Topical Solution] [N/A:N/A] Tissue Debrided: [N/A:N/A] Fibrin/Slough, Exudates, Subcutaneous Level: Skin/Subcutaneous Tissue N/A N/A Debridement Area (sq cm): 1.75 N/A N/A Instrument: Curette N/A N/A Bleeding: Minimum N/A N/A Hemostasis Achieved: Pressure N/A N/A Procedural Pain: 0 N/A N/A Post Procedural Pain: 0 N/A N/A Debridement Treatment Procedure was tolerated well N/A N/A Response: Post Debridement 2.5x0.7x0.2 N/A N/A Measurements L x W x D (cm) Post Debridement Volume: 0.275 N/A N/A (cm) Periwound Skin Texture: Excoriation: No N/A N/A Induration: No Callus: No Crepitus: No Rash: No Scarring: No Periwound Skin Moisture: Maceration: No N/A N/A Dry/Scaly: No Periwound Skin Color: Erythema: Yes N/A N/A Atrophie Blanche: No Cyanosis: No Ecchymosis: No Hemosiderin Staining: No Mottled: No Pallor: No Rubor: No Erythema Location: Circumferential N/A N/A Temperature: No Abnormality N/A N/A Tenderness on Palpation: Yes N/A N/A Wound Preparation: Ulcer Cleansing: N/A N/A  Rinsed/Irrigated with Saline Topical Anesthetic Applied: Other: lidocaine 4% Procedures Performed: Debridement N/A N/A Treatment Notes Wound #1 (Right, Posterior Lower Leg) 1. Cleansed with: Clean wound with Normal Saline 2. Anesthetic Topical Lidocaine 4% cream to wound bed prior to debridement 4. Dressing Applied: Santyl Ointment 5. Secondary Dressing Applied Bordered Foam Dressing Dry Gauze Electronic Signature(s) [name], [name] ([id number]) Signed: 05/17/2017 3:06:42 PM By: Christin Fudge MD, FACS Previous Signature: 05/17/2017 2:48:15 PM Version By: Alric Quan Entered By: Christin Fudge on 05/17/2017  15:06:42 [name] ([id number]) -------------------------------------------------------------------------------- [place] Details Patient Name: [name], [name]. Date of Service: 05/17/2017 2:30 PM Medical Record Number: [id number] Patient Account Number: [id number] Date of Birth/Sex: 08-01-1950 (66 y.o. Male) Treating RN: Ahmed Prima Primary Care Shakur Lembo: Alma Friendly Other Clinician: Referring Rhianne Soman: Alma Friendly Treating Mead Slane/Extender: Frann Rider in Treatment: 2 Active Inactive ` Nutrition Nursing Diagnoses: Impaired glucose control: actual or potential Goals: Patient/caregiver verbalizes understanding of need to maintain therapeutic glucose control per primary care physician Date Initiated: 04/27/2017 Target Resolution Date: 07/04/2017 Goal Status: Active Interventions: Assess HgA1c results as ordered upon admission and as needed Provide education on elevated blood sugars and impact on wound healing Notes: ` Orientation to the Wound Care Program Nursing Diagnoses: Knowledge deficit related to the wound healing center program Goals: Patient/caregiver will verbalize understanding of the [place] Date Initiated: 04/27/2017 Target Resolution Date: 07/20/2017 Goal Status: Active Interventions: Provide education on orientation to the wound center Notes: ` Wound/Skin Impairment Nursing Diagnoses: Impaired tissue integrity Goals: Ulcer/skin breakdown will heal within 14 weeks Date Initiated: 04/27/2017 Target Resolution Date: 07/20/2017 Goal Status: Active [name], [name] ([id number]) Interventions: Assess patient/caregiver ability to obtain necessary supplies Assess patient/caregiver ability to perform ulcer/skin care regimen upon admission and as needed Assess ulceration(s) every visit Notes: Electronic Signature(s) Signed: 05/17/2017 2:48:08 PM By: Alric Quan Entered By: Alric Quan on  05/17/2017 14:48:07 [name] ([id number]) -------------------------------------------------------------------------------- Pain Assessment Details Patient Name: [name]. Date of Service: 05/17/2017 2:30 PM Medical Record Number: [id number] Patient Account Number: [id number] Date of Birth/Sex: 07-20-1950 (66 y.o. Male) Treating RN: Roger Shelter Primary Care Janalyn Higby: Alma Friendly Other Clinician: Referring Obelia Bonello: Alma Friendly Treating Makennah Omura/Extender: Frann Rider in Treatment: 2 Active Problems Location of Pain Severity and Description of Pain Patient Has Paino No Site Locations Pain Management and Medication Current Pain Management: Electronic Signature(s) Signed: 05/18/2017 9:46:34 AM By: Roger Shelter Entered By: Roger Shelter on 05/17/2017 14:34:46 [name] ([id number]) -------------------------------------------------------------------------------- Patient/Caregiver Education Details Patient Name: [name]. Date of Service: 05/17/2017 2:30 PM Medical Record Number: [id number] Patient Account Number: [id number] Date of Birth/Gender: 04-May-1951 (66 y.o. Male) Treating RN: Ahmed Prima Primary Care Physician: Alma Friendly Other Clinician: Referring Physician: Alma Friendly Treating Physician/Extender: Frann Rider in Treatment: 2 Education Assessment Education Provided To: Patient Education Topics Provided Wound/Skin Impairment: Handouts: Other: change dressing as ordered Methods: Demonstration, Explain/Verbal Responses: State content correctly Electronic Signature(s) Signed: 05/17/2017 4:53:27 PM By: Alric Quan Entered By: Alric Quan on 05/17/2017 14:48:59 [name] ([id number]) -------------------------------------------------------------------------------- Wound Assessment Details Patient Name: [name]. Date of Service: 05/17/2017 2:30 PM Medical Record Number:  [id number] Patient Account Number: [id number] Date of Birth/Sex: June 08, 1951 (66 y.o. Male) Treating RN: Roger Shelter Primary Care Gene Colee: Alma Friendly Other Clinician: Referring Avilyn Virtue: Alma Friendly Treating Kutter Schnepf/Extender: Frann Rider in Treatment: 2 Wound Status Wound Number: 1 Primary Etiology: Trauma, Other Wound Location: Right Lower Leg - Posterior Wound Status: Open Wounding Event:  Trauma Comorbid Hypertension, Type II Diabetes, History: Osteoarthritis Date Acquired: 04/06/2017 Weeks Of Treatment: 2 Clustered Wound: No Wound Measurements Length: (cm) 2.5 % Reduct Width: (cm) 0.7 % Reduct Depth: (cm) 0.1 Epitheli Area: (cm) 1.374 Tunneli Volume: (cm) 0.137 Undermi ion in Area: 47% ion in Volume: 47.1% alization: None ng: No ning: No Wound Description Full Thickness Without Exposed Support Foul Odo Classification: Structures Slough/F Wound Margin: Flat and Intact Exudate Large Amount: Exudate Type: Serous Exudate Color: amber r After Cleansing: No ibrino Yes Wound Bed Granulation Amount: Small (1-33%) Exposed Structure Granulation Quality: Pink Fascia Exposed: No Necrotic Amount: Large (67-100%) Fat Layer (Subcutaneous Tissue) Exposed: Yes Necrotic Quality: Adherent Slough Tendon Exposed: No Muscle Exposed: No Joint Exposed: No Bone Exposed: No Periwound Skin Texture Texture Color No Abnormalities Noted: No No Abnormalities Noted: No Callus: No Atrophie Blanche: No Crepitus: No Cyanosis: No Excoriation: No Ecchymosis: No Induration: No Erythema: Yes Rash: No Erythema Location: Circumferential Scarring: No Hemosiderin Staining: No Mottled: No Moisture Pallor: No No Abnormalities Noted: No Rubor: No Dry / Scaly: No [name], [name] ([id number]) Maceration: No Temperature / Pain Temperature: No Abnormality Tenderness on Palpation: Yes Wound Preparation Ulcer Cleansing: Rinsed/Irrigated with Saline Topical  Anesthetic Applied: Other: lidocaine 4%, Treatment Notes Wound #1 (Right, Posterior Lower Leg) 1. Cleansed with: Clean wound with Normal Saline 2. Anesthetic Topical Lidocaine 4% cream to wound bed prior to debridement 4. Dressing Applied: Santyl Ointment 5. Secondary Dressing Applied Bordered Foam Dressing Dry Gauze Electronic Signature(s) Signed: 05/18/2017 9:46:34 AM By: Roger Shelter Entered By: Roger Shelter on 05/17/2017 14:42:01 [name], [name] ([id number]) -------------------------------------------------------------------------------- [place] Details Patient Name: [name]. Date of Service: 05/17/2017 2:30 PM Medical Record Number: [id number] Patient Account Number: [id number] Date of Birth/Sex: 1951-06-10 (66 y.o. Male) Treating RN: Roger Shelter Primary Care Markale Birdsell: Alma Friendly Other Clinician: Referring Jianni Shelden: Alma Friendly Treating Nianna Igo/Extender: Frann Rider in Treatment: 2 Vital Signs Time Taken: 02:34 Temperature (F): 98.6 Height (in): 70 Pulse (bpm): 96 Weight (lbs): 302 Respiratory Rate (breaths/min): 20 Body Mass Index (BMI): 43.3 Blood Pressure (mmHg): 177/84 Reference Range: 80 - 120 mg / dl Electronic Signature(s) Signed: 05/18/2017 9:46:34 AM By: Roger Shelter Entered By: Roger Shelter on 05/17/2017 14:38:04

## 2017-05-24 ENCOUNTER — Encounter: Payer: BLUE CROSS/BLUE SHIELD | Admitting: Surgery

## 2017-05-24 DIAGNOSIS — E11622 Type 2 diabetes mellitus with other skin ulcer: Secondary | ICD-10-CM | POA: Diagnosis not present

## 2017-05-26 NOTE — Progress Notes (Signed)
Juan, Barton ([id number]) Visit Report for 05/24/2017 Chief Complaint Document Details Patient Name: [name], [name]. Date of Service: 05/24/2017 11:00 AM Medical Record Number: [id number] Patient Account Number: [id number] Date of Birth/Sex: [birth year] (66 y.o. Male) Treating RN: Montey Hora Primary Care Provider: Alma Friendly Other Clinician: Referring Provider: Alma Friendly Treating Provider/Extender: Frann Rider in Treatment: 3 Information Obtained from: Patient Chief Complaint Patient seen for complaints of Non-Healing Wound to the right calf which he sustained about 3 weeks ago Electronic Signature(s) Signed: 05/24/2017 11:44:33 AM By: Christin Fudge MD, FACS Entered By: Christin Fudge on 05/24/2017 11:44:33 [name] ([id number]) -------------------------------------------------------------------------------- Debridement Details Patient Name: [name]. Date of Service: 05/24/2017 11:00 AM Medical Record Number: [id number] Patient Account Number: [id number] Date of Birth/Sex: 06-30-1951 (66 y.o. Male) Treating RN: Montey Hora Primary Care Provider: Alma Friendly Other Clinician: Referring Provider: Alma Friendly Treating Provider/Extender: Frann Rider in Treatment: 3 Debridement Performed for Wound #1 Right,Posterior Lower Leg Assessment: Performed By: Physician Christin Fudge, MD Debridement: Debridement Pre-procedure Verification/Time Yes - 11:29 Out Taken: Start Time: 11:29 Pain Control: Lidocaine 4% Topical Solution Level: Skin/Subcutaneous Tissue Total Area Debrided (L x W): 2.4 (cm) x 0.6 (cm) = 1.44 (cm) Tissue and other material Viable, Non-Viable, Fibrin/Slough, Subcutaneous debrided: Instrument: Curette Bleeding: Minimum Hemostasis Achieved: Pressure End Time: 11:31 Procedural Pain: 0 Post Procedural Pain: 0 Response to Treatment: Procedure was tolerated well Post Debridement Measurements of Total Wound Length:  (cm) 2.4 Width: (cm) 0.6 Depth: (cm) 0.2 Volume: (cm) 0.226 Character of Wound/Ulcer Post Debridement: Improved Post Procedure Diagnosis Same as Pre-procedure Electronic Signature(s) Signed: 05/24/2017 11:44:27 AM By: Christin Fudge MD, FACS Signed: 05/24/2017 5:11:26 PM By: Montey Hora Entered By: Christin Fudge on 05/24/2017 11:44:27 [name] ([id number]) -------------------------------------------------------------------------------- HPI Details Patient Name: [name]. Date of Service: 05/24/2017 11:00 AM Medical Record Number: [id number] Patient Account Number: [id number] Date of Birth/Sex: January 07, 1951 (66 y.o. Male) Treating RN: Montey Hora Primary Care Provider: Alma Friendly Other Clinician: Referring Provider: Alma Friendly Treating Provider/Extender: Frann Rider in Treatment: 3 History of Present Illness Location: right calf Quality: Patient reports experiencing a dull pain to affected area(s). Severity: Patient states wound are getting worse. Duration: Patient has had the wound for < 4 weeks prior to presenting for treatment Timing: Pain in wound is Intermittent (comes and goes Context: The wound occurred when the patient had a laceration with a tractor Modifying Factors: Other treatment(s) tried include:oral antibiotics and Bactroban ointment Associated Signs and Symptoms: Patient reports having increase discharge. HPI Description: 66 year old male with a history of diabetes mellitus type 2 has been seeing his PCP for lower extremity laceration and was initially seen on September 20. 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 The nurse practitioner at the practice was concerned about her venous stasis ulcer and referred him to the wound center. He is a former smoker who quit in 2016 and has a past medical history of arthritis, bronchitis, colon polyps,  diabetes mellitus and hypertension Last hemoglobin A1c about a year ago was 7.5%. He says he's had a more recent hemoglobin A1c done and it was about 7.2% 05/03/2017 -- he was unable to get Santyl through his insurance and hence he's been using Medihoney. Electronic Signature(s) Signed: 05/24/2017 11:44:42 AM By: Christin Fudge MD, FACS Entered By: Christin Fudge on 05/24/2017 11:44:41 [name] ([id number]) -------------------------------------------------------------------------------- Physical Exam Details Patient Name:  Juan Baumgarten M. Date of Service: 05/24/2017 11:00 AM Medical Record Number: [id number] Patient Account Number: [id number] Date of Birth/Sex: [birth year] (66 y.o. Male) Treating RN: Montey Hora Primary Care Provider: Alma Friendly Other Clinician: Referring Provider: Alma Friendly Treating Provider/Extender: Frann Rider in Treatment: 3 Constitutional . Pulse regular. Respirations normal and unlabored. Afebrile. . Eyes Nonicteric. Reactive to light. Ears, Nose, Mouth, and Throat Lips, teeth, and gums WNL.Marland Kitchen Moist mucosa without lesions. Neck supple and nontender. No palpable supraclavicular or cervical adenopathy. Normal sized without goiter. Respiratory WNL. No retractions.. Cardiovascular Pedal Pulses WNL. No clubbing, cyanosis or edema. Lymphatic No adneopathy. No adenopathy. No adenopathy. Musculoskeletal Adexa without tenderness or enlargement.. Digits and nails w/o clubbing, cyanosis, infection, petechiae, ischemia, or inflammatory conditions.. Integumentary (Hair, Skin) No suspicious lesions. No crepitus or fluctuance. No peri-wound warmth or erythema. No masses.Marland Kitchen Psychiatric Judgement and insight Intact.. No evidence of depression, anxiety, or agitation.. Notes the wound is slow to improve and still has a lot of fibrotic subcutaneous debris which I sharply removed with a #3 curet and brisk bleeding controlled with pressure Electronic  Signature(s) Signed: 05/24/2017 11:45:29 AM By: Christin Fudge MD, FACS Entered By: Christin Fudge on 05/24/2017 11:45:28 [name] ([id number]) -------------------------------------------------------------------------------- Physician Orders Details Patient Name: [name]. Date of Service: 05/24/2017 11:00 AM Medical Record Number: [id number] Patient Account Number: [id number] Date of Birth/Sex: 18-Jul-1950 (66 y.o. Male) Treating RN: Montey Hora Primary Care Provider: Alma Friendly Other Clinician: Referring Provider: Alma Friendly Treating Provider/Extender: Frann Rider in Treatment: 3 Verbal / Phone Orders: No Diagnosis Coding Wound Cleansing Wound #1 Right,Posterior Lower Leg o Clean wound with Normal Saline. o May Shower, gently pat wound dry prior to applying new dressing. Anesthetic Wound #1 Right,Posterior Lower Leg o Topical Lidocaine 4% cream applied to wound bed prior to debridement Primary Wound Dressing Wound #1 Right,Posterior Lower Leg o Santyl Ointment - in clinic o Medihoney gel - at home Secondary Dressing Wound #1 Right,Posterior Lower Leg o Boardered Foam Dressing - or telfa island dressing Dressing Change Frequency Wound #1 Right,Posterior Lower Leg o Change dressing every day. Follow-up Appointments Wound #1 Right,Posterior Lower Leg o Return Appointment in 1 week. Edema Control Wound #1 Right,Posterior Lower Leg o Elevate legs to the level of the heart and pump ankles as often as possible Additional Orders / Instructions Wound #1 Right,Posterior Lower Leg o Increase protein intake. o Other: - Try to keep blood sugars below 180 and please add vitamin A, vitamin C and zinc supplements to your diet Medications-please add to medication list. Wound #1 Right,Posterior Lower Leg o [address] LOYD, MARHEFKA ([id number]) Electronic Signature(s) Signed: 05/24/2017 12:03:11 PM By: Christin Fudge MD,  FACS Signed: 05/24/2017 5:11:26 PM By: Montey Hora Entered By: Montey Hora on 05/24/2017 11:34:12 KEINO, PLACENCIA ([id number]) -------------------------------------------------------------------------------- Problem List Details Patient Name: SNYDER, COLAVITO. Date of Service: 05/24/2017 11:00 AM Medical Record Number: [id number] Patient Account Number: [id number] Date of Birth/Sex: 1951/01/21 (66 y.o. Male) Treating RN: Montey Hora Primary Care Provider: Alma Friendly Other Clinician: Referring Provider: Alma Friendly Treating Provider/Extender: Frann Rider in Treatment: 3 Active Problems ICD-10 Encounter Code Description Active Date Diagnosis E11.622 Type 2 diabetes mellitus with other skin ulcer 04/27/2017 Yes S81.811A Laceration without foreign body, right lower leg, initial encounter 04/27/2017 Yes L97.212 Non-pressure chronic ulcer of right calf with fat layer exposed 04/27/2017 Yes E66.01 Morbid (severe) obesity due to excess calories 04/27/2017 Yes Inactive Problems Resolved Problems Electronic Signature(s) Signed: 05/24/2017 11:43:48 AM  By: Christin Fudge MD, FACS Entered By: Christin Fudge on 05/24/2017 11:43:48 [name] ([id number]) -------------------------------------------------------------------------------- Progress Note Details Patient Name: ERAGON, HAMMOND. Date of Service: 05/24/2017 11:00 AM Medical Record Number: [id number] Patient Account Number: [id number] Date of Birth/Sex: May 13, 1951 (66 y.o. Male) Treating RN: Montey Hora Primary Care Provider: Alma Friendly Other Clinician: Referring Provider: Alma Friendly Treating Provider/Extender: Frann Rider in Treatment: 3 Subjective Chief Complaint Information obtained from Patient Patient seen for complaints of Non-Healing Wound to the right calf which he sustained about 3 weeks ago History of Present Illness (HPI) The following HPI elements were documented for the  patient's wound: Location: right calf Quality: Patient reports experiencing a dull pain to affected area(s). Severity: Patient states wound are getting worse. Duration: Patient has had the wound for < 4 weeks prior to presenting for treatment Timing: Pain in wound is Intermittent (comes and goes Context: The wound occurred when the patient had a laceration with a tractor Modifying Factors: Other treatment(s) tried include:oral antibiotics and Bactroban ointment Associated Signs and Symptoms: Patient reports having increase discharge. 66 year old male with a history of diabetes mellitus type 2 has been seeing his PCP for lower extremity laceration and was initially seen on September 20. 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 The nurse practitioner at the practice was concerned about her venous stasis ulcer and referred him to the wound center. He is a former smoker who quit in 2016 and has a past medical history of arthritis, bronchitis, colon polyps, diabetes mellitus and hypertension Last hemoglobin A1c about a year ago was 7.5%. He says he's had a more recent hemoglobin A1c done and it was about 7.2% 05/03/2017 -- he was unable to get Santyl through his insurance and hence he's been using Medihoney. Patient History Information obtained from Patient. Family History Diabetes - Mother, Stroke - Mother,Father, No family history of Cancer, Heart Disease, Hereditary Spherocytosis, Hypertension, Kidney Disease, Lung Disease, Seizures, Thyroid Problems, Tuberculosis. Social History Former smoker - 2+ years ago, Marital Status - Married, Alcohol Use - Never, Drug Use - No History, Caffeine Use - Daily. Medical And Surgical History Notes Respiratory bronchitis DYLLEN, MENNING. ([id number]) Objective Constitutional Pulse regular. Respirations normal and unlabored. Afebrile. Vitals Time Taken: 11:16  AM, Height: 70 in, Weight: 302 lbs, BMI: 43.3, Temperature: 98.3 F, Pulse: 81 bpm, Respiratory Rate: 20 breaths/min, Blood Pressure: 139/73 mmHg. Eyes Nonicteric. Reactive to light. Ears, Nose, Mouth, and Throat Lips, teeth, and gums WNL.Marland Kitchen Moist mucosa without lesions. Neck supple and nontender. No palpable supraclavicular or cervical adenopathy. Normal sized without goiter. Respiratory WNL. No retractions.. Cardiovascular Pedal Pulses WNL. No clubbing, cyanosis or edema. Lymphatic No adneopathy. No adenopathy. No adenopathy. Musculoskeletal Adexa without tenderness or enlargement.. Digits and nails w/o clubbing, cyanosis, infection, petechiae, ischemia, or inflammatory conditions.Marland Kitchen Psychiatric Judgement and insight Intact.. No evidence of depression, anxiety, or agitation.. General Notes: the wound is slow to improve and still has a lot of fibrotic subcutaneous debris which I sharply removed with a #3 curet and brisk bleeding controlled with pressure Integumentary (Hair, Skin) No suspicious lesions. No crepitus or fluctuance. No peri-wound warmth or erythema. No masses.. Wound #1 status is Open. Original cause of wound was Trauma. The wound is located on the Right,Posterior Lower Leg. The wound measures 2.4cm length x 0.6cm width x 0.1cm depth; 1.131cm^2 area and 0.113cm^3 volume. There is Fat Layer (Subcutaneous Tissue) Exposed  exposed. There is no tunneling or undermining noted. There is a large amount of serous drainage noted. The wound margin is flat and intact. There is small (1-33%) pink granulation within the wound bed. There is a large (67-100%) amount of necrotic tissue within the wound bed including Adherent Slough. The periwound skin appearance exhibited: Erythema. The periwound skin appearance did not exhibit: Callus, Crepitus, Excoriation, Induration, Rash, Scarring, Dry/Scaly, Maceration, Atrophie Blanche, Cyanosis, Ecchymosis, Hemosiderin Staining, Mottled, Pallor,  Rubor. The surrounding wound skin color is noted with erythema which is circumferential. Periwound temperature was noted as No Abnormality. The periwound has tenderness on palpation. MOUHAMAD, TEED ([id number]) Assessment Active Problems ICD-10 E11.622 - Type 2 diabetes mellitus with other skin ulcer S81.811A - Laceration without foreign body, right lower leg, initial encounter L97.212 - Non-pressure chronic ulcer of right calf with fat layer exposed E66.01 - Morbid (severe) obesity due to excess calories Procedures Wound #1 Pre-procedure diagnosis of Wound #1 is a Trauma, Other located on the Right,Posterior Lower Leg . There was a Skin/Subcutaneous Tissue Debridement ([id number]) debridement with total area of 1.44 sq cm performed by Christin Fudge, MD. with the following instrument(s): Curette to remove Viable and Non-Viable tissue/material including Fibrin/Slough and Subcutaneous after achieving pain control using Lidocaine 4% Topical Solution. A time out was conducted at 11:29, prior to the start of the procedure. A Minimum amount of bleeding was controlled with Pressure. The procedure was tolerated well with a pain level of 0 throughout and a pain level of 0 following the procedure. Post Debridement Measurements: 2.4cm length x 0.6cm width x 0.2cm depth; 0.226cm^3 volume. Character of Wound/Ulcer Post Debridement is improved. Post procedure Diagnosis Wound #1: Same as Pre-Procedure Plan Wound Cleansing: Wound #1 Right,Posterior Lower Leg: Clean wound with Normal Saline. May Shower, gently pat wound dry prior to applying new dressing. Anesthetic: Wound #1 Right,Posterior Lower Leg: Topical Lidocaine 4% cream applied to wound bed prior to debridement Primary Wound Dressing: Wound #1 Right,Posterior Lower Leg: Santyl Ointment - in clinic Medihoney gel - at home Secondary Dressing: Wound #1 Right,Posterior Lower Leg: Boardered Foam Dressing - or telfa island dressing Dressing  Change Frequency: Wound #1 Right,Posterior Lower Leg: Change dressing every day. Follow-up Appointments: Wound #1 Right,Posterior Lower Leg: Return Appointment in 1 week. Edema Control: ZEN, CEDILLOS. ([id number]) Wound #1 Right,Posterior Lower Leg: Elevate legs to the level of the heart and pump ankles as often as possible Additional Orders / Instructions: Wound #1 Right,Posterior Lower Leg: Increase protein intake. Other: - Try to keep blood sugars below 180 and please add vitamin A, vitamin C and zinc supplements to your diet Medications-please add to medication list.: Wound #1 Right,Posterior Lower Leg: Santyl Enzymatic Ointment unfortunately he cannot afford Santyl ointment and after sharp debridement and review I have recommended: 1. Medihoney ointment locally, to be applied daily after washing with soap and water 2. Good control of his diabetes mellitus 3. Adequate protein, vitamin A, vitamin C and zinc 4. Regular visits to the wound center Electronic Signature(s) Signed: 05/24/2017 11:46:03 AM By: Christin Fudge MD, FACS Entered By: Christin Fudge on 05/24/2017 11:46:03 JOVANTE, HAMMITT ([id number]) -------------------------------------------------------------------------------- ROS/PFSH Details Patient Name: [name]. Date of Service: 05/24/2017 11:00 AM Medical Record Number: [id number] Patient Account Number: [id number] Date of Birth/Sex: 1950-12-04 (66 y.o. Male) Treating RN: Montey Hora Primary Care Provider: Alma Friendly Other Clinician: Referring Provider: Alma Friendly Treating Provider/Extender: Frann Rider in Treatment: 3 Information Obtained From Patient Wound History Do you currently have one  or more open woundso Yes How many open wounds do you currently haveo 1 Approximately how long have you had your woundso 3 weeks How have you been treating your wound(s) until nowo dry dressing Has your wound(s) ever healed and then re-openedo  No Have you had any lab work done in the past montho No Have you tested positive for an antibiotic resistant organism (MRSA, VRE)o No Have you tested positive for osteomyelitis (bone infection)o No Have you had any tests for circulation on your legso No Have you had other problems associated with your woundso Infection Respiratory Medical History: Past Medical History Notes: bronchitis Cardiovascular Medical History: Positive for: Hypertension Endocrine Medical History: Positive for: Type II Diabetes Time with diabetes: since 1997 Treated with: Insulin Blood sugar tested every day: Yes Tested : Musculoskeletal Medical History: Positive for: Osteoarthritis Immunizations Pneumococcal Vaccine: Received Pneumococcal Vaccination: Yes Immunization Notes: up to date Implantable Devices Family and Social History IRBIN, FINES ([id number]) Cancer: No; Diabetes: Yes - Mother; Heart Disease: No; Hereditary Spherocytosis: No; Hypertension: No; Kidney Disease: No; Lung Disease: No; Seizures: No; Stroke: Yes - Mother,Father; Thyroid Problems: No; Tuberculosis: No; Former smoker - 2+ years ago; Marital Status - Married; Alcohol Use: Never; Drug Use: No History; Caffeine Use: Daily; Financial Concerns: No; Food, Clothing or Shelter Needs: No; Support System Lacking: No; Transportation Concerns: No; Advanced Directives: No; Patient does not want information on Advanced Directives Physician Affirmation I have reviewed and agree with the above information. Electronic Signature(s) Signed: 05/24/2017 12:03:11 PM By: Christin Fudge MD, FACS Signed: 05/24/2017 5:11:26 PM By: Montey Hora Entered By: Christin Fudge on 05/24/2017 11:44:59 [name] ([id number]) -------------------------------------------------------------------------------- SuperBill Details Patient Name: SUDEEP, SCHEIBEL. Date of Service: 05/24/2017 Medical Record Number: [id number] Patient Account Number: [id number] Date of  Birth/Sex: [birth year] (66 y.o. Male) Treating RN: Montey Hora Primary Care Provider: Alma Friendly Other Clinician: Referring Provider: Alma Friendly Treating Provider/Extender: Frann Rider in Treatment: 3 Diagnosis Coding ICD-10 Codes Code Description [phone] Type 2 diabetes mellitus with other skin ulcer S81.811A Laceration without foreign body, right lower leg, initial encounter L97.212 Non-pressure chronic ulcer of right calf with fat layer exposed E66.01 Morbid (severe) obesity due to excess calories Facility Procedures CPT4 Code: [id number] Description: 65993 - DEB SUBQ TISSUE 20 SQ CM/< ICD-10 Diagnosis Description E11.622 Type 2 diabetes mellitus with other skin ulcer S81.811A Laceration without foreign body, right lower leg, initial enc L97.212 Non-pressure chronic ulcer of right calf  with fat layer expos E66.01 Morbid (severe) obesity due to excess calories Modifier: ounter ed Quantity: 1 Physician Procedures CPT4 Code: [id number] Description: 39030 - WC PHYS SUBQ TISS 20 SQ CM ICD-10 Diagnosis Description E11.622 Type 2 diabetes mellitus with other skin ulcer S81.811A Laceration without foreign body, right lower leg, initial enc L97.212 Non-pressure chronic ulcer of right calf  with fat layer expos E66.01 Morbid (severe) obesity due to excess calories Modifier: ounter ed Quantity: 1 Electronic Signature(s) Signed: 05/24/2017 11:46:15 AM By: Christin Fudge MD, FACS Entered By: Christin Fudge on 05/24/2017 11:46:15

## 2017-05-26 NOTE — Progress Notes (Signed)
[name], [name] ([id number]) Visit Report for 05/24/2017 Arrival Information Details Patient Name: [name], [name]. Date of Service: 05/24/2017 11:00 AM Medical Record Number: [id number] Patient Account Number: [id number] Date of Birth/Sex: 18-Feb-1951 (66 y.o. Male) Treating RN: Montey Hora Primary Care Carlous Olivares: Alma Friendly Other Clinician: Referring Jaionna Weisse: Alma Friendly Treating Jerelle Virden/Extender: Frann Rider in Treatment: 3 Visit Information History Since Last Visit Added or deleted any medications: No Patient Arrived: Ambulatory Any new allergies or adverse reactions: No Arrival Time: 11:14 Had a fall or experienced change in No Accompanied By: spouse activities of daily living that may affect Transfer Assistance: None risk of falls: Patient Identification Verified: Yes Signs or symptoms of abuse/neglect since last visito No Secondary Verification Process Completed: Yes Hospitalized since last visit: No Patient Has Alerts: Yes Has Dressing in Place as Prescribed: Yes Patient Alerts: DMII Pain Present Now: No Electronic Signature(s) Signed: 05/24/2017 5:11:26 PM By: Montey Hora Entered By: Montey Hora on 05/24/2017 11:15:43 [name] ([id number]) -------------------------------------------------------------------------------- Encounter Discharge Information Details Patient Name: [name]. Date of Service: 05/24/2017 11:00 AM Medical Record Number: [id number] Patient Account Number: [id number] Date of Birth/Sex: 01/13/1951 (66 y.o. Male) Treating RN: Montey Hora Primary Care Anyla Israelson: Alma Friendly Other Clinician: Referring Osmel Dykstra: Alma Friendly Treating Porshe Fleagle/Extender: Frann Rider in Treatment: 3 Encounter Discharge Information Items Discharge Pain Level: 0 Discharge Condition: Stable Ambulatory Status: Ambulatory Discharge Destination: Home Transportation: Private Auto Accompanied By: spouse Schedule Follow-up  Appointment: Yes Medication Reconciliation completed and No provided to Patient/Care Jahaad Penado: Provided on Clinical Summary of Care: 05/24/2017 Form Type Recipient Paper Patient JB Electronic Signature(s) Signed: 05/24/2017 11:52:06 AM By: Montey Hora Entered By: Montey Hora on 05/24/2017 11:52:06 [name] ([id number]) -------------------------------------------------------------------------------- Lower Extremity Assessment Details Patient Name: [name]. Date of Service: 05/24/2017 11:00 AM Medical Record Number: [id number] Patient Account Number: [id number] Date of Birth/Sex: 1950-09-12 (66 y.o. Male) Treating RN: Montey Hora Primary Care Daeveon Zweber: Alma Friendly Other Clinician: Referring Alic Hilburn: Alma Friendly Treating Keyen Marban/Extender: Frann Rider in Treatment: 3 Vascular Assessment Pulses: Dorsalis Pedis Palpable: [Right:Yes] Posterior Tibial Extremity colors, hair growth, and conditions: Extremity Color: [Right:Normal] Hair Growth on Extremity: [Right:Yes] Temperature of Extremity: [Right:Warm] Capillary Refill: [Right:< 3 seconds] Electronic Signature(s) Signed: 05/24/2017 5:11:26 PM By: Montey Hora Entered By: Montey Hora on 05/24/2017 11:20:55 [name] ([id number]) -------------------------------------------------------------------------------- Multi Wound Chart Details Patient Name: [name]. Date of Service: 05/24/2017 11:00 AM Medical Record Number: [id number] Patient Account Number: [id number] Date of Birth/Sex: 08-09-1950 (66 y.o. Male) Treating RN: Montey Hora Primary Care Cooper Moroney: Alma Friendly Other Clinician: Referring Makell Drohan: Alma Friendly Treating Janique Hoefer/Extender: Frann Rider in Treatment: 3 Vital Signs Height(in): 70 Pulse(bpm): 108 Weight(lbs): 302 Blood Pressure(mmHg): 139/73 Body Mass Index(BMI): 43 Temperature(F): 98.3 Respiratory Rate 20 (breaths/min): Photos: [1:No  Photos] [N/A:N/A] Wound Location: [1:Right Lower Leg - Posterior] [N/A:N/A] Wounding Event: [1:Trauma] [N/A:N/A] Primary Etiology: [1:Trauma, Other] [N/A:N/A] Comorbid History: [1:Hypertension, Type II Diabetes, Osteoarthritis] [N/A:N/A] Date Acquired: [1:04/06/2017] [N/A:N/A] Weeks of Treatment: [1:3] [N/A:N/A] Wound Status: [1:Open] [N/A:N/A] Measurements L x W x D [1:2.4x0.6x0.1] [N/A:N/A] (cm) Area (cm) : [1:1.131] [N/A:N/A] Volume (cm) : [1:0.113] [N/A:N/A] % Reduction in Area: [1:56.40%] [N/A:N/A] % Reduction in Volume: [1:56.40%] [N/A:N/A] Classification: [1:Full Thickness Without Exposed Support Structures] [N/A:N/A] Exudate Amount: [1:Large] [N/A:N/A] Exudate Type: [1:Serous] [N/A:N/A] Exudate Color: [1:amber] [N/A:N/A] Wound Margin: [1:Flat and Intact] [N/A:N/A] Granulation Amount: [1:Small (1-33%)] [N/A:N/A] Granulation Quality: [1:Pink] [N/A:N/A] Necrotic Amount: [1:Large (67-100%)] [N/A:N/A] Exposed Structures: [1:Fat Layer (Subcutaneous Tissue) Exposed: Yes Fascia: No Tendon:  No Muscle: No Joint: No Bone: No] [N/A:N/A] Epithelialization: [1:None] [N/A:N/A] Debridement: [1:Debridement ([id number])] [N/A:N/A] Pre-procedure [1:11:29] [N/A:N/A] Verification/Time Out Taken: Pain Control: [1:Lidocaine 4% Topical Solution] [N/A:N/A] Tissue Debrided: [1:Fibrin/Slough, Subcutaneous] [N/A:N/A] Level: Skin/Subcutaneous Tissue N/A N/A Debridement Area (sq cm): 1.44 N/A N/A Instrument: Curette N/A N/A Bleeding: Minimum N/A N/A Hemostasis Achieved: Pressure N/A N/A Procedural Pain: 0 N/A N/A Post Procedural Pain: 0 N/A N/A Debridement Treatment Procedure was tolerated well N/A N/A Response: Post Debridement 2.4x0.6x0.2 N/A N/A Measurements L x W x D (cm) Post Debridement Volume: 0.226 N/A N/A (cm) Periwound Skin Texture: Excoriation: No N/A N/A Induration: No Callus: No Crepitus: No Rash: No Scarring: No Periwound Skin Moisture: Maceration: No N/A  N/A Dry/Scaly: No Periwound Skin Color: Erythema: Yes N/A N/A Atrophie Blanche: No Cyanosis: No Ecchymosis: No Hemosiderin Staining: No Mottled: No Pallor: No Rubor: No Erythema Location: Circumferential N/A N/A Temperature: No Abnormality N/A N/A Tenderness on Palpation: Yes N/A N/A Wound Preparation: Ulcer Cleansing: N/A N/A Rinsed/Irrigated with Saline Topical Anesthetic Applied: Other: lidocaine 4% Procedures Performed: Debridement N/A N/A Treatment Notes Electronic Signature(s) Signed: 05/24/2017 11:43:54 AM By: Christin Fudge MD, FACS Entered By: Christin Fudge on 05/24/2017 11:43:54 [name] ([id number]) -------------------------------------------------------------------------------- [place] Details Patient Name: [name], [name]. Date of Service: 05/24/2017 11:00 AM Medical Record Number: [id number] Patient Account Number: [id number] Date of Birth/Sex: 07/15/1951 (66 y.o. Male) Treating RN: Montey Hora Primary Care Quincee Gittens: Alma Friendly Other Clinician: Referring Shunna Mikaelian: Alma Friendly Treating Amarea Macdowell/Extender: Frann Rider in Treatment: 3 Active Inactive ` Nutrition Nursing Diagnoses: Impaired glucose control: actual or potential Goals: Patient/caregiver verbalizes understanding of need to maintain therapeutic glucose control per primary care physician Date Initiated: 04/27/2017 Target Resolution Date: 07/04/2017 Goal Status: Active Interventions: Assess HgA1c results as ordered upon admission and as needed Provide education on elevated blood sugars and impact on wound healing Notes: ` Orientation to the Wound Care Program Nursing Diagnoses: Knowledge deficit related to the wound healing center program Goals: Patient/caregiver will verbalize understanding of the [place] Date Initiated: 04/27/2017 Target Resolution Date: 07/20/2017 Goal Status: Active Interventions: Provide education on  orientation to the wound center Notes: ` Wound/Skin Impairment Nursing Diagnoses: Impaired tissue integrity Goals: Ulcer/skin breakdown will heal within 14 weeks Date Initiated: 04/27/2017 Target Resolution Date: 07/20/2017 Goal Status: Active [name], [name] ([id number]) Interventions: Assess patient/caregiver ability to obtain necessary supplies Assess patient/caregiver ability to perform ulcer/skin care regimen upon admission and as needed Assess ulceration(s) every visit Notes: Electronic Signature(s) Signed: 05/24/2017 5:11:26 PM By: Montey Hora Entered By: Montey Hora on 05/24/2017 11:21:03 [name] ([id number]) -------------------------------------------------------------------------------- Pain Assessment Details Patient Name: [name]. Date of Service: 05/24/2017 11:00 AM Medical Record Number: [id number] Patient Account Number: [id number] Date of Birth/Sex: 1950/11/09 (66 y.o. Male) Treating RN: Montey Hora Primary Care Arloa Prak: Alma Friendly Other Clinician: Referring Chayla Shands: Alma Friendly Treating Laqueena Hinchey/Extender: Frann Rider in Treatment: 3 Active Problems Location of Pain Severity and Description of Pain Patient Has Paino No Site Locations Pain Management and Medication Current Pain Management: Electronic Signature(s) Signed: 05/24/2017 5:11:26 PM By: Montey Hora Entered By: Montey Hora on 05/24/2017 11:15:53 [name] ([id number]) -------------------------------------------------------------------------------- Patient/Caregiver Education Details Patient Name: [name]. Date of Service: 05/24/2017 11:00 AM Medical Record Number: [id number] Patient Account Number: [id number] Date of Birth/Gender: March 14, 1951 (66 y.o. Male) Treating RN: Montey Hora Primary Care Physician: Alma Friendly Other Clinician: Referring Physician: Alma Friendly Treating Physician/Extender: Frann Rider in Treatment:  3 Education Assessment Education  Provided To: Patient and Caregiver Education Topics Provided Wound/Skin Impairment: Handouts: Other: wound care as ordered Methods: Demonstration, Explain/Verbal Responses: State content correctly Electronic Signature(s) Signed: 05/24/2017 5:11:26 PM By: Montey Hora Entered By: Montey Hora on 05/24/2017 11:52:25 [name] ([id number]) -------------------------------------------------------------------------------- Wound Assessment Details Patient Name: [name]. Date of Service: 05/24/2017 11:00 AM Medical Record Number: [id number] Patient Account Number: [id number] Date of Birth/Sex: April 06, 1951 (66 y.o. Male) Treating RN: Montey Hora Primary Care Shanayah Kaffenberger: Alma Friendly Other Clinician: Referring Ovie Eastep: Alma Friendly Treating Teva Bronkema/Extender: Frann Rider in Treatment: 3 Wound Status Wound Number: 1 Primary Etiology: Trauma, Other Wound Location: Right Lower Leg - Posterior Wound Status: Open Wounding Event: Trauma Comorbid Hypertension, Type II Diabetes, History: Osteoarthritis Date Acquired: 04/06/2017 Weeks Of Treatment: 3 Clustered Wound: No Photos Photo Uploaded By: Montey Hora on 05/25/2017 09:14:54 Wound Measurements Length: (cm) 2.4 Width: (cm) 0.6 Depth: (cm) 0.1 Area: (cm) 1.131 Volume: (cm) 0.113 % Reduction in Area: 56.4% % Reduction in Volume: 56.4% Epithelialization: None Tunneling: No Undermining: No Wound Description Full Thickness Without Exposed Support Foul O Classification: Structures Slough Wound Margin: Flat and Intact Exudate Large Amount: Exudate Type: Serous Exudate Color: amber dor After Cleansing: No /Fibrino Yes Wound Bed Granulation Amount: Small (1-33%) Exposed Structure Granulation Quality: Pink Fascia Exposed: No Necrotic Amount: Large (67-100%) Fat Layer (Subcutaneous Tissue) Exposed: Yes Necrotic Quality: Adherent Slough Tendon Exposed:  No Muscle Exposed: No Joint Exposed: No Bone Exposed: No [name], [name]. ([id number]) Periwound Skin Texture Texture Color No Abnormalities Noted: No No Abnormalities Noted: No Callus: No Atrophie Blanche: No Crepitus: No Cyanosis: No Excoriation: No Ecchymosis: No Induration: No Erythema: Yes Rash: No Erythema Location: Circumferential Scarring: No Hemosiderin Staining: No Mottled: No Moisture Pallor: No No Abnormalities Noted: No Rubor: No Dry / Scaly: No Maceration: No Temperature / Pain Temperature: No Abnormality Tenderness on Palpation: Yes Wound Preparation Ulcer Cleansing: Rinsed/Irrigated with Saline Topical Anesthetic Applied: Other: lidocaine 4%, Treatment Notes Wound #1 (Right, Posterior Lower Leg) 1. Cleansed with: Clean wound with Normal Saline 2. Anesthetic Topical Lidocaine 4% cream to wound bed prior to debridement 4. Dressing Applied: Santyl Ointment 5. Secondary Dressing Applied Dry [place] Signature(s) Signed: 05/24/2017 5:11:26 PM By: Montey Hora Entered By: Montey Hora on 05/24/2017 11:20:28 [name] ([id number]) -------------------------------------------------------------------------------- [place] Details Patient Name: [name], [name]. Date of Service: 05/24/2017 11:00 AM Medical Record Number: [id number] Patient Account Number: [id number] Date of Birth/Sex: 1950/08/17 (66 y.o. Male) Treating RN: Montey Hora Primary Care Malcome Ambrocio: Alma Friendly Other Clinician: Referring Mainor Hellmann: Alma Friendly Treating Yasheka Fossett/Extender: Frann Rider in Treatment: 3 Vital Signs Time Taken: 11:16 Temperature (F): 98.3 Height (in): 70 Pulse (bpm): 81 Weight (lbs): 302 Respiratory Rate (breaths/min): 20 Body Mass Index (BMI): 43.3 Blood Pressure (mmHg): 139/73 Reference Range: 80 - 120 mg / dl Electronic Signature(s) Signed: 05/24/2017 5:11:26 PM By: Montey Hora Entered By: Montey Hora on  05/24/2017 11:17:19

## 2017-05-31 ENCOUNTER — Encounter: Payer: BLUE CROSS/BLUE SHIELD | Admitting: Surgery

## 2017-05-31 DIAGNOSIS — E11622 Type 2 diabetes mellitus with other skin ulcer: Secondary | ICD-10-CM | POA: Diagnosis not present

## 2017-06-01 NOTE — Progress Notes (Addendum)
[name], [name] ([id number]) Visit Report for 05/31/2017 Chief Complaint Document Details Patient Name: [name], [name]. Date of Service: 05/31/2017 8:15 AM Medical Record Number: [id number] Patient Account Number: [id number] Date of Birth/Sex: 07-05-1951 (66 y.o. Male) Treating RN: Primary Care Provider: Alma Friendly Other Clinician: Referring Provider: Alma Friendly Treating Provider/Extender: Frann Rider in Treatment: 4 Information Obtained from: Patient Chief Complaint Patient seen for complaints of Non-Healing Wound to the right calf which he sustained about 3 weeks ago Electronic Signature(s) Signed: 05/31/2017 8:32:42 AM By: Christin Fudge MD, FACS Entered By: Christin Fudge on 05/31/2017 08:32:42 [name], [name] ([id number]) -------------------------------------------------------------------------------- Debridement Details Patient Name: [name]. Date of Service: 05/31/2017 8:15 AM Medical Record Number: [id number] Patient Account Number: [id number] Date of Birth/Sex: February 28, 1951 (66 y.o. Male) Treating RN: Primary Care Provider: Alma Friendly Other Clinician: Referring Provider: Alma Friendly Treating Provider/Extender: Frann Rider in Treatment: 4 Debridement Performed for Wound #1 Right,Posterior Lower Leg Assessment: Performed By: Physician Christin Fudge, MD Debridement: Debridement Pre-procedure Verification/Time Yes - 08:15 Out Taken: Start Time: 08:16 Pain Control: Lidocaine 4% Topical Solution Level: Skin/Subcutaneous Tissue Total Area Debrided (L x W): 1 (cm) x 0.9 (cm) = 0.9 (cm) Tissue and other material Viable, Non-Viable, Exudate, Fibrin/Slough, Subcutaneous debrided: Instrument: Curette Bleeding: Minimum Hemostasis Achieved: Pressure End Time: 08:18 Procedural Pain: 0 Post Procedural Pain: 0 Response to Treatment: Procedure was tolerated well Post Debridement Measurements of Total Wound Length: (cm) 1 Width: (cm)  0.9 Depth: (cm) 0.3 Volume: (cm) 0.212 Character of Wound/Ulcer Post Debridement: Requires Further Debridement Post Procedure Diagnosis Same as Pre-procedure Electronic Signature(s) Signed: 05/31/2017 8:32:37 AM By: Christin Fudge MD, FACS Entered By: Christin Fudge on 05/31/2017 08:32:37 [name] ([id number]) -------------------------------------------------------------------------------- HPI Details Patient Name: [name]. Date of Service: 05/31/2017 8:15 AM Medical Record Number: [id number] Patient Account Number: [id number] Date of Birth/Sex: 1951/01/14 (66 y.o. Male) Treating RN: Primary Care Provider: Alma Friendly Other Clinician: Referring Provider: Alma Friendly Treating Provider/Extender: Frann Rider in Treatment: 4 History of Present Illness Location: right calf Quality: Patient reports experiencing a dull pain to affected area(s). Severity: Patient states wound are getting worse. Duration: Patient has had the wound for < 4 weeks prior to presenting for treatment Timing: Pain in wound is Intermittent (comes and goes Context: The wound occurred when the patient had a laceration with a tractor Modifying Factors: Other treatment(s) tried include:oral antibiotics and Bactroban ointment Associated Signs and Symptoms: Patient reports having increase discharge. HPI Description: 66 year old male with a history of diabetes mellitus type 2 has been seeing his PCP for lower extremity laceration and was initially seen on September 20. 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 The nurse practitioner at the practice was concerned about her venous stasis ulcer and referred him to the wound center. He is a former smoker who quit in 2016 and has a past medical history of arthritis, bronchitis, colon polyps, diabetes mellitus and hypertension Last hemoglobin A1c about a year  ago was 7.5%. He says he's had a more recent hemoglobin A1c done and it was about 7.2% 05/03/2017 -- he was unable to get Santyl through his insurance and hence he's been using Medihoney. 05/31/2017 -- due to the lack of insurance covering Santyl we have not been able to use this over the month and I have strongly recommended Santyl ointment again as he is not making adequate progress. We will  do appeared to peer if required and given him all the possible coupons so that he is able to get hold of this Electronic Signature(s) Signed: 05/31/2017 8:33:22 AM By: Christin Fudge MD, FACS Entered By: Christin Fudge on 05/31/2017 08:33:22 [name], [name] ([id number]) -------------------------------------------------------------------------------- Physical Exam Details Patient Name: [name], [name]. Date of Service: 05/31/2017 8:15 AM Medical Record Number: [id number] Patient Account Number: [id number] Date of Birth/Sex: [birth year] (66 y.o. Male) Treating RN: Primary Care Provider: Alma Friendly Other Clinician: Referring Provider: Alma Friendly Treating Provider/Extender: Frann Rider in Treatment: 4 Constitutional . Pulse regular. Respirations normal and unlabored. Afebrile. . Eyes Nonicteric. Reactive to light. Ears, Nose, Mouth, and Throat Lips, teeth, and gums WNL.Marland Kitchen Moist mucosa without lesions. Neck supple and nontender. No palpable supraclavicular or cervical adenopathy. Normal sized without goiter. Respiratory WNL. No retractions.. Cardiovascular Pedal Pulses WNL. No clubbing, cyanosis or edema. Lymphatic No adneopathy. No adenopathy. No adenopathy. Musculoskeletal Adexa without tenderness or enlargement.. Digits and nails w/o clubbing, cyanosis, infection, petechiae, ischemia, or inflammatory conditions.. Integumentary (Hair, Skin) No suspicious lesions. No crepitus or fluctuance. No peri-wound warmth or erythema. No masses.Marland Kitchen Psychiatric Judgement and insight Intact.. No  evidence of depression, anxiety, or agitation.. Notes the wound continues to be fibrotic and again sharp debridement was done with a #3 curet and minimal bleeding and told with pressure Electronic Signature(s) Signed: 05/31/2017 8:33:56 AM By: Christin Fudge MD, FACS Entered By: Christin Fudge on 05/31/2017 08:33:56 [name] ([id number]) -------------------------------------------------------------------------------- Physician Orders Details Patient Name: [name]. Date of Service: 05/31/2017 8:15 AM Medical Record Number: [id number] Patient Account Number: [id number] Date of Birth/Sex: 1950-10-13 (66 y.o. Male) Treating RN: Ahmed Prima Primary Care Provider: Alma Friendly Other Clinician: Referring Provider: Alma Friendly Treating Provider/Extender: Frann Rider in Treatment: 4 Verbal / Phone Orders: Yes ClinicianCarolyne Fiscal, Debi Read Back and Verified: Yes Diagnosis Coding Wound Cleansing Wound #1 Right,Posterior Lower Leg o Clean wound with Normal Saline. o May Shower, gently pat wound dry prior to applying new dressing. Anesthetic Wound #1 Right,Posterior Lower Leg o Topical Lidocaine 4% cream applied to wound bed prior to debridement Primary Wound Dressing Wound #1 Right,Posterior Lower Leg o Santyl Ointment - in clinic o Medihoney gel - at home Secondary Dressing Wound #1 Right,Posterior Lower Leg o Boardered Foam Dressing - or telfa island dressing Dressing Change Frequency Wound #1 Right,Posterior Lower Leg o Change dressing every day. Follow-up Appointments Wound #1 Right,Posterior Lower Leg o Return Appointment in 1 week. Edema Control Wound #1 Right,Posterior Lower Leg o Elevate legs to the level of the heart and pump ankles as often as possible Additional Orders / Instructions Wound #1 Right,Posterior Lower Leg o Increase protein intake. o Other: - Try to keep blood sugars below 180 and please add vitamin A,  vitamin C and zinc supplements to your diet Medications-please add to medication list. Wound #1 Right,Posterior Lower Leg o Santyl Enzymatic Ointment Patient Medications [name], [name] ([id number]) Allergies: Statins-Hmg-Coa Reductase Inhibitors Notifications Medication Indication Start End Santyl 05/31/2017 DOSE topical 250 unit/gram ointment - ointment topical as directed Electronic Signature(s) Signed: 05/31/2017 8:32:18 AM By: Christin Fudge MD, FACS Entered By: Christin Fudge on 05/31/2017 08:32:17 [name] ([id number]) -------------------------------------------------------------------------------- Problem List Details Patient Name: ZAHIR, EISENHOUR. Date of Service: 05/31/2017 8:15 AM Medical Record Number: [id number] Patient Account Number: [id number] Date of Birth/Sex: [birth year] (66 y.o. Male) Treating RN: Primary Care Provider: Alma Friendly Other Clinician: Referring Provider: Alma Friendly Treating Provider/Extender: Frann Rider in Treatment: 4  Active Problems ICD-10 Encounter Code Description Active Date Diagnosis E11.622 Type 2 diabetes mellitus with other skin ulcer 04/27/2017 Yes S81.811A Laceration without foreign body, right lower leg, initial encounter 04/27/2017 Yes L97.212 Non-pressure chronic ulcer of right calf with fat layer exposed 04/27/2017 Yes E66.01 Morbid (severe) obesity due to excess calories 04/27/2017 Yes Inactive Problems Resolved Problems Electronic Signature(s) Signed: 05/31/2017 8:32:27 AM By: Christin Fudge MD, FACS Entered By: Christin Fudge on 05/31/2017 08:32:26 [name] ([id number]) -------------------------------------------------------------------------------- Progress Note Details Patient Name: [name]. Date of Service: 05/31/2017 8:15 AM Medical Record Number: [id number] Patient Account Number: [id number] Date of Birth/Sex: 1951-03-14 (66 y.o. Male) Treating RN: Primary Care Provider: Alma Friendly  Other Clinician: Referring Provider: Alma Friendly Treating Provider/Extender: Frann Rider in Treatment: 4 Subjective Chief Complaint Information obtained from Patient Patient seen for complaints of Non-Healing Wound to the right calf which he sustained about 3 weeks ago History of Present Illness (HPI) The following HPI elements were documented for the patient's wound: Location: right calf Quality: Patient reports experiencing a dull pain to affected area(s). Severity: Patient states wound are getting worse. Duration: Patient has had the wound for < 4 weeks prior to presenting for treatment Timing: Pain in wound is Intermittent (comes and goes Context: The wound occurred when the patient had a laceration with a tractor Modifying Factors: Other treatment(s) tried include:oral antibiotics and Bactroban ointment Associated Signs and Symptoms: Patient reports having increase discharge. 66 year old male with a history of diabetes mellitus type 2 has been seeing his PCP for lower extremity laceration and was initially seen on September 20. 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 The nurse practitioner at the practice was concerned about her venous stasis ulcer and referred him to the wound center. He is a former smoker who quit in 2016 and has a past medical history of arthritis, bronchitis, colon polyps, diabetes mellitus and hypertension Last hemoglobin A1c about a year ago was 7.5%. He says he's had a more recent hemoglobin A1c done and it was about 7.2% 05/03/2017 -- he was unable to get Santyl through his insurance and hence he's been using Medihoney. 05/31/2017 -- due to the lack of insurance covering Santyl we have not been able to use this over the month and I have strongly recommended Santyl ointment again as he is not making adequate progress. We will do appeared to peer if  required and given him all the possible coupons so that he is able to get hold of this Patient History Information obtained from Patient. Family History Diabetes - Mother, Stroke - Mother,Father, No family history of Cancer, Heart Disease, Hereditary Spherocytosis, Hypertension, Kidney Disease, Lung Disease, Seizures, Thyroid Problems, Tuberculosis. Social History Former smoker - 2+ years ago, Marital Status - Married, Alcohol Use - Never, Drug Use - No History, Caffeine Use - Daily. Medical And Surgical History Notes Respiratory bronchitis [name], [name]. ([id number]) Objective Constitutional Pulse regular. Respirations normal and unlabored. Afebrile. Vitals Time Taken: 8:07 AM, Height: 70 in, Weight: 302 lbs, BMI: 43.3, Temperature: 98.1 F, Pulse: 90 bpm, Respiratory Rate: 20 breaths/min, Blood Pressure: 147/79 mmHg. Eyes Nonicteric. Reactive to light. Ears, Nose, Mouth, and Throat Lips, teeth, and gums WNL.Marland Kitchen Moist mucosa without lesions. Neck supple and nontender. No palpable supraclavicular or cervical adenopathy. Normal sized without goiter. Respiratory WNL. No retractions.. Cardiovascular Pedal Pulses WNL. No clubbing, cyanosis or edema. Lymphatic No adneopathy. No  adenopathy. No adenopathy. Musculoskeletal Adexa without tenderness or enlargement.. Digits and nails w/o clubbing, cyanosis, infection, petechiae, ischemia, or inflammatory conditions.Marland Kitchen Psychiatric Judgement and insight Intact.. No evidence of depression, anxiety, or agitation.. General Notes: the wound continues to be fibrotic and again sharp debridement was done with a #3 curet and minimal bleeding and told with pressure Integumentary (Hair, Skin) No suspicious lesions. No crepitus or fluctuance. No peri-wound warmth or erythema. No masses.. Wound #1 status is Open. Original cause of wound was Trauma. The wound is located on the Right,Posterior Lower Leg. The wound measures 1cm length x 0.9cm width x  0.2cm depth; 0.707cm^2 area and 0.141cm^3 volume. There is Fat Layer (Subcutaneous Tissue) Exposed exposed. There is no tunneling or undermining noted. There is a large amount of serous drainage noted. The wound margin is flat and intact. There is no granulation within the wound bed. There is a large (67-100%) amount of necrotic tissue within the wound bed including Adherent Slough. The periwound skin appearance exhibited: Erythema. The periwound skin appearance did not exhibit: Callus, Crepitus, Excoriation, Induration, Rash, Scarring, Dry/Scaly, Maceration, Atrophie Blanche, Cyanosis, Ecchymosis, Hemosiderin Staining, Mottled, Pallor, Rubor. The surrounding wound skin color is noted with erythema which is circumferential. Periwound temperature was noted as No [name], [name]. ([id number]) Abnormality. The periwound has tenderness on palpation. Assessment Active Problems ICD-10 E11.622 - Type 2 diabetes mellitus with other skin ulcer S81.811A - Laceration without foreign body, right lower leg, initial encounter L97.212 - Non-pressure chronic ulcer of right calf with fat layer exposed E66.01 - Morbid (severe) obesity due to excess calories Procedures Wound #1 Pre-procedure diagnosis of Wound #1 is a Trauma, Other located on the Right,Posterior Lower Leg . There was a Skin/Subcutaneous Tissue Debridement ([id number]) debridement with total area of 0.9 sq cm performed by Christin Fudge, MD. with the following instrument(s): Curette to remove Viable and Non-Viable tissue/material including Exudate, Fibrin/Slough, and Subcutaneous after achieving pain control using Lidocaine 4% Topical Solution. A time out was conducted at 08:15, prior to the start of the procedure. A Minimum amount of bleeding was controlled with Pressure. The procedure was tolerated well with a pain level of 0 throughout and a pain level of 0 following the procedure. Post Debridement Measurements: 1cm length x 0.9cm width x 0.3cm  depth; 0.212cm^3 volume. Character of Wound/Ulcer Post Debridement requires further debridement. Post procedure Diagnosis Wound #1: Same as Pre-Procedure Plan Wound Cleansing: Wound #1 Right,Posterior Lower Leg: Clean wound with Normal Saline. May Shower, gently pat wound dry prior to applying new dressing. Anesthetic: Wound #1 Right,Posterior Lower Leg: Topical Lidocaine 4% cream applied to wound bed prior to debridement Primary Wound Dressing: Wound #1 Right,Posterior Lower Leg: Santyl Ointment - in clinic Medihoney gel - at home Secondary Dressing: Wound #1 Right,Posterior Lower Leg: Boardered Foam Dressing - or telfa island dressing Dressing Change Frequency: Wound #1 Right,Posterior Lower Leg: Change dressing every day. Follow-up Appointments: [name], [name] ([id number]) Wound #1 Right,Posterior Lower Leg: Return Appointment in 1 week. Edema Control: Wound #1 Right,Posterior Lower Leg: Elevate legs to the level of the heart and pump ankles as often as possible Additional Orders / Instructions: Wound #1 Right,Posterior Lower Leg: Increase protein intake. Other: - Try to keep blood sugars below 180 and please add vitamin A, vitamin C and zinc supplements to your diet Medications-please add to medication list.: Wound #1 Right,Posterior Lower Leg: Santyl Enzymatic Ointment The following medication(s) was prescribed: Santyl topical 250 unit/gram ointment ointment topical as directed starting 05/31/2017 his insurance will  not cover Santyl ointment and unfortunately he cannot afford it. I am happy to do appeared to peer review with his insurance company or give him as many coupons as possible to help him get Santyl. After sharp debridement and review I have recommended: 1. Medihoney ointment locally, to be applied daily after washing with soap and water 2. Good control of his diabetes mellitus 3. Adequate protein, vitamin A, vitamin C and zinc 4. Regular visits to the wound  center Electronic Signature(s) Signed: 05/31/2017 8:35:57 AM By: Christin Fudge MD, FACS Entered By: Christin Fudge on 05/31/2017 08:35:57 [name], [name] ([id number]) -------------------------------------------------------------------------------- ROS/PFSH Details Patient Name: [name]. Date of Service: 05/31/2017 8:15 AM Medical Record Number: [id number] Patient Account Number: [id number] Date of Birth/Sex: [birth year] (66 y.o. Male) Treating RN: Primary Care Provider: Alma Friendly Other Clinician: Referring Provider: Alma Friendly Treating Provider/Extender: Frann Rider in Treatment: 4 Information Obtained From Patient Wound History Do you currently have one or more open woundso Yes How many open wounds do you currently haveo 1 Approximately how long have you had your woundso 3 weeks How have you been treating your wound(s) until nowo dry dressing Has your wound(s) ever healed and then re-openedo No Have you had any lab work done in the past montho No Have you tested positive for an antibiotic resistant organism (MRSA, VRE)o No Have you tested positive for osteomyelitis (bone infection)o No Have you had any tests for circulation on your legso No Have you had other problems associated with your woundso Infection Respiratory Medical History: Past Medical History Notes: bronchitis Cardiovascular Medical History: Positive for: Hypertension Endocrine Medical History: Positive for: Type II Diabetes Time with diabetes: since 1997 Treated with: Insulin Blood sugar tested every day: Yes Tested : Musculoskeletal Medical History: Positive for: Osteoarthritis Immunizations Pneumococcal Vaccine: Received Pneumococcal Vaccination: Yes Immunization Notes: up to date Implantable Devices Family and Social History [name], [name] ([id number]) Cancer: No; Diabetes: Yes - Mother; Heart Disease: No; Hereditary Spherocytosis: No; Hypertension: No; Kidney Disease: No;  Lung Disease: No; Seizures: No; Stroke: Yes - Mother,Father; Thyroid Problems: No; Tuberculosis: No; Former smoker - 2+ years ago; Marital Status - Married; Alcohol Use: Never; Drug Use: No History; Caffeine Use: Daily; Financial Concerns: No; Food, Clothing or Shelter Needs: No; Support System Lacking: No; Transportation Concerns: No; Advanced Directives: No; Patient does not want information on Advanced Directives Physician Affirmation I have reviewed and agree with the above information. Electronic Signature(s) Signed: 05/31/2017 11:55:06 AM By: Christin Fudge MD, FACS Entered By: Christin Fudge on 05/31/2017 08:33:29 [name], [name] ([id number]) -------------------------------------------------------------------------------- SuperBill Details Patient Name: [name], [place]. Date of Service: 05/31/2017 Medical Record Number: [id number] Patient Account Number: [id number] Date of Birth/Sex: 01-07-1951 (66 y.o. Male) Treating RN: Primary Care Provider: Alma Friendly Other Clinician: Referring Provider: Alma Friendly Treating Provider/Extender: Frann Rider in Treatment: 4 Diagnosis Coding ICD-10 Codes Code Description E11.622 Type 2 diabetes mellitus with other skin ulcer S81.811A Laceration without foreign body, right lower leg, initial encounter L97.212 Non-pressure chronic ulcer of right calf with fat layer exposed E66.01 Morbid (severe) obesity due to excess calories Facility Procedures CPT4 Code: [id number] Description: 62831 - DEB SUBQ TISSUE 20 SQ CM/< ICD-10 Diagnosis Description E11.622 Type 2 diabetes mellitus with other skin ulcer S81.811A Laceration without foreign body, right lower leg, initial enc L97.212 Non-pressure chronic ulcer of right calf  with fat layer expos E66.01 Morbid (severe) obesity due to excess calories Modifier: ounter ed Quantity: 1 Physician Procedures CPT4 Code: 5176160 Description:  11042 - WC PHYS SUBQ TISS 20 SQ CM ICD-10 Diagnosis  Description E11.622 Type 2 diabetes mellitus with other skin ulcer S81.811A Laceration without foreign body, right lower leg, initial enc L97.212 Non-pressure chronic ulcer of right calf  with fat layer expos E66.01 Morbid (severe) obesity due to excess calories Modifier: ounter ed Quantity: 1 Electronic Signature(s) Signed: 05/31/2017 8:36:11 AM By: Christin Fudge MD, FACS Entered By: Christin Fudge on 05/31/2017 08:36:11

## 2017-06-05 NOTE — Progress Notes (Signed)
[name], [name] ([id number]) Visit Report for 05/31/2017 Arrival Information Details Patient Name: [name], [name]. Date of Service: 05/31/2017 8:15 AM Medical Record Number: [id number] Patient Account Number: [id number] Date of Birth/Sex: 1951/01/27 (66 y.o. Male) Treating RN: Ahmed Prima Primary Care Dejana Pugsley: Alma Friendly Other Clinician: Referring Elnoria [place]: Alma Friendly Treating Weslee Fogg/Extender: Frann Rider in Treatment: 4 Visit Information History Since Last Visit All ordered tests and consults were completed: No Patient Arrived: Ambulatory Added or deleted any medications: No Arrival Time: 08:06 Any new allergies or adverse reactions: No Accompanied By: wife Had a fall or experienced change in No Transfer Assistance: None activities of daily living that may affect Patient Identification Verified: Yes risk of falls: Secondary Verification Process Completed: Yes Signs or symptoms of abuse/neglect since last visito No Patient Requires Transmission-Based No Hospitalized since last visit: No Precautions: Has Dressing in Place as Prescribed: Yes Patient Has Alerts: Yes Pain Present Now: No Patient Alerts: DMII Electronic Signature(s) Signed: 05/31/2017 11:51:08 AM By: Alric Quan Entered By: Alric Quan on 05/31/2017 08:07:10 [name] ([id number]) -------------------------------------------------------------------------------- Encounter Discharge Information Details Patient Name: [name]. Date of Service: 05/31/2017 8:15 AM Medical Record Number: [id number] Patient Account Number: [id number] Date of Birth/Sex: March 03, 1951 (66 y.o. Male) Treating RN: Ahmed Prima Primary Care Savien Mamula: Alma Friendly Other Clinician: Referring Olney Monier: Alma Friendly Treating Exie Chrismer/Extender: Frann Rider in Treatment: 4 Encounter Discharge Information Items Discharge Pain Level: 0 Discharge Condition: Stable Ambulatory Status:  Ambulatory Discharge Destination: Home Transportation: Private Auto Accompanied By: wife Schedule Follow-up Appointment: Yes Medication Reconciliation completed and No provided to Patient/Care Caress Reffitt: Provided on Clinical Summary of Care: 05/31/2017 Form Type Recipient Paper Patient JB Electronic Signature(s) Signed: 06/05/2017 8:35:27 AM By: Ruthine Dose Entered By: Ruthine Dose on 05/31/2017 08:26:39 [name] ([id number]) -------------------------------------------------------------------------------- Lower Extremity Assessment Details Patient Name: [name]. Date of Service: 05/31/2017 8:15 AM Medical Record Number: [id number] Patient Account Number: [id number] Date of Birth/Sex: 06-24-1951 (66 y.o. Male) Treating RN: Ahmed Prima Primary Care Dashonna Chagnon: Alma Friendly Other Clinician: Referring Laryah Neuser: Alma Friendly Treating Keysi Oelkers/Extender: Frann Rider in Treatment: 4 Vascular Assessment Pulses: Dorsalis Pedis Palpable: [Right:Yes] Posterior Tibial Extremity colors, hair growth, and conditions: Extremity Color: [Right:Normal] Hair Growth on Extremity: [Right:Yes] Temperature of Extremity: [Right:Warm] Capillary Refill: [Right:< 3 seconds] Toe Nail Assessment Left: Right: Thick: No Discolored: No Deformed: No Improper Length and Hygiene: Yes Electronic Signature(s) Signed: 05/31/2017 11:51:08 AM By: Alric Quan Entered By: Alric Quan on 05/31/2017 08:10:52 [name] ([id number]) -------------------------------------------------------------------------------- Multi Wound Chart Details Patient Name: [name]. Date of Service: 05/31/2017 8:15 AM Medical Record Number: [id number] Patient Account Number: [id number] Date of Birth/Sex: 11-Jan-1951 (66 y.o. Male) Treating RN: Ahmed Prima Primary Care Terrion Gencarelli: Alma Friendly Other Clinician: Referring Trevante Tennell: Alma Friendly Treating Aleina Burgio/Extender:  Frann Rider in Treatment: 4 Vital Signs Height(in): 70 Pulse(bpm): 90 Weight(lbs): 302 Blood Pressure(mmHg): 147/79 Body Mass Index(BMI): 43 Temperature(F): 98.1 Respiratory Rate 20 (breaths/min): Photos: [1:No Photos] [N/A:N/A] Wound Location: [1:Right Lower Leg - Posterior] [N/A:N/A] Wounding Event: [1:Trauma] [N/A:N/A] Primary Etiology: [1:Trauma, Other] [N/A:N/A] Comorbid History: [1:Hypertension, Type II Diabetes, Osteoarthritis] [N/A:N/A] Date Acquired: [1:04/06/2017] [N/A:N/A] Weeks of Treatment: [1:4] [N/A:N/A] Wound Status: [1:Open] [N/A:N/A] Measurements L x W x D [1:1x0.9x0.2] [N/A:N/A] (cm) Area (cm) : [1:0.707] [N/A:N/A] Volume (cm) : [1:0.141] [N/A:N/A] % Reduction in Area: [1:72.70%] [N/A:N/A] % Reduction in Volume: [1:45.60%] [N/A:N/A] Classification: [1:Full Thickness Without Exposed Support Structures] [N/A:N/A] Exudate Amount: [1:Large] [N/A:N/A] Exudate Type: [1:Serous] [N/A:N/A] Exudate Color: [1:amber] [N/A:N/A] Wound Margin: [  1:Flat and Intact] [N/A:N/A] Granulation Amount: [1:None Present (0%)] [N/A:N/A] Necrotic Amount: [1:Large (67-100%)] [N/A:N/A] Exposed Structures: [1:Fat Layer (Subcutaneous Tissue) Exposed: Yes Fascia: No Tendon: No Muscle: No Joint: No Bone: No] [N/A:N/A] Epithelialization: [1:None] [N/A:N/A] Debridement: [1:Debridement ([id number])] [N/A:N/A] Pre-procedure [1:08:15] [N/A:N/A] Verification/Time Out Taken: Pain Control: [1:Lidocaine 4% Topical Solution] [N/A:N/A] Tissue Debrided: [1:Fibrin/Slough, Exudates, Subcutaneous] [N/A:N/A] Level: Skin/Subcutaneous Tissue N/A N/A Debridement Area (sq cm): 0.9 N/A N/A Instrument: Curette N/A N/A Bleeding: Minimum N/A N/A Hemostasis Achieved: Pressure N/A N/A Procedural Pain: 0 N/A N/A Post Procedural Pain: 0 N/A N/A Debridement Treatment Procedure was tolerated well N/A N/A Response: Post Debridement 1x0.9x0.3 N/A N/A Measurements L x W x D (cm) Post Debridement  Volume: 0.212 N/A N/A (cm) Periwound Skin Texture: Excoriation: No N/A N/A Induration: No Callus: No Crepitus: No Rash: No Scarring: No Periwound Skin Moisture: Maceration: No N/A N/A Dry/Scaly: No Periwound Skin Color: Erythema: Yes N/A N/A Atrophie Blanche: No Cyanosis: No Ecchymosis: No Hemosiderin Staining: No Mottled: No Pallor: No Rubor: No Erythema Location: Circumferential N/A N/A Temperature: No Abnormality N/A N/A Tenderness on Palpation: Yes N/A N/A Wound Preparation: Ulcer Cleansing: N/A N/A Rinsed/Irrigated with Saline Topical Anesthetic Applied: Other: lidocaine 4% Procedures Performed: Debridement N/A N/A Treatment Notes Wound #1 (Right, Posterior Lower Leg) 1. Cleansed with: Clean wound with Normal Saline 2. Anesthetic Topical Lidocaine 4% cream to wound bed prior to debridement 3. Peri-wound Care: Skin Prep 4. Dressing Applied: Santyl Ointment 5. Secondary [place] Signature(s) [name], [name] [place] ([id number]) Signed: 05/31/2017 8:32:30 AM By: Christin Fudge MD, FACS Entered By: Christin Fudge on 05/31/2017 08:32:30 [name], [name] ([id number]) -------------------------------------------------------------------------------- [place] Details Patient Name: [name], [name]. Date of Service: 05/31/2017 8:15 AM Medical Record Number: [id number] Patient Account Number: [id number] Date of Birth/Sex: [birth year] (66 y.o. Male) Treating RN: Ahmed Prima Primary Care Lawonda Pretlow: Alma Friendly Other Clinician: Referring Isadore Palecek: Alma Friendly Treating Yuan Gann/Extender: Frann Rider in Treatment: 4 Active Inactive ` Nutrition Nursing Diagnoses: Impaired glucose control: actual or potential Goals: Patient/caregiver verbalizes understanding of need to maintain therapeutic glucose control per primary care physician Date Initiated: 04/27/2017 Target Resolution Date: 07/04/2017 Goal Status:  Active Interventions: Assess HgA1c results as ordered upon admission and as needed Provide education on elevated blood sugars and impact on wound healing Notes: ` Orientation to the Wound Care Program Nursing Diagnoses: Knowledge deficit related to the wound healing center program Goals: Patient/caregiver will verbalize understanding of the [place] Date Initiated: 04/27/2017 Target Resolution Date: 07/20/2017 Goal Status: Active Interventions: Provide education on orientation to the wound center Notes: ` Wound/Skin Impairment Nursing Diagnoses: Impaired tissue integrity Goals: Ulcer/skin breakdown will heal within 14 weeks Date Initiated: 04/27/2017 Target Resolution Date: 07/20/2017 Goal Status: Active [name], [name] ([id number]) Interventions: Assess patient/caregiver ability to obtain necessary supplies Assess patient/caregiver ability to perform ulcer/skin care regimen upon admission and as needed Assess ulceration(s) every visit Notes: Electronic Signature(s) Signed: 05/31/2017 11:51:08 AM By: Alric Quan Entered By: Alric Quan on 05/31/2017 08:14:33 [name] ([id number]) -------------------------------------------------------------------------------- Pain Assessment Details Patient Name: [name]. Date of Service: 05/31/2017 8:15 AM Medical Record Number: [id number] Patient Account Number: [id number] Date of Birth/Sex: 10-27-1950 (66 y.o. Male) Treating RN: Ahmed Prima Primary Care Zeno Hickel: Alma Friendly Other Clinician: Referring Rushawn Capshaw: Alma Friendly Treating Lupie Sawa/Extender: Frann Rider in Treatment: 4 Active Problems Location of Pain Severity and Description of Pain Patient Has Paino No Site Locations Pain Management and Medication Current Pain Management: Electronic Signature(s) Signed: 05/31/2017  11:51:08 AM By: Alric Quan Entered By: Alric Quan on 05/31/2017 08:07:16 [name] ([id number]) -------------------------------------------------------------------------------- Patient/Caregiver Education Details Patient Name: [name]. Date of Service: 05/31/2017 8:15 AM Medical Record Number: [id number] Patient Account Number: [id number] Date of Birth/Gender: 1950/11/05 (66 y.o. Male) Treating RN: Ahmed Prima Primary Care Physician: Alma Friendly Other Clinician: Referring Physician: Alma Friendly Treating Physician/Extender: Frann Rider in Treatment: 4 Education Assessment Education Provided To: Patient Education Topics Provided Wound/Skin Impairment: Handouts: Other: change dressing as ordered Methods: Demonstration, Explain/Verbal Responses: State content correctly Electronic Signature(s) Signed: 05/31/2017 11:51:08 AM By: Alric Quan Entered By: Alric Quan on 05/31/2017 08:15:19 [name] ([id number]) -------------------------------------------------------------------------------- Wound Assessment Details Patient Name: [name]. Date of Service: 05/31/2017 8:15 AM Medical Record Number: [id number] Patient Account Number: [id number] Date of Birth/Sex: 05/05/1951 (66 y.o. Male) Treating RN: Ahmed Prima Primary Care Meribeth Vitug: Alma Friendly Other Clinician: Referring Jhovani Griswold: Alma Friendly Treating Lachanda Buczek/Extender: Frann Rider in Treatment: 4 Wound Status Wound Number: 1 Primary Etiology: Trauma, Other Wound Location: Right Lower Leg - Posterior Wound Status: Open Wounding Event: Trauma Comorbid Hypertension, Type II Diabetes, History: Osteoarthritis Date Acquired: 04/06/2017 Weeks Of Treatment: 4 Clustered Wound: No Photos Photo Uploaded By: Alric Quan on 05/31/2017 11:52:08 Wound Measurements Length: (cm) 1 Width: (cm) 0.9 Depth: (cm) 0.2 Area: (cm) 0.707 Volume: (cm) 0.141 % Reduction in Area: 72.7% % Reduction in Volume: 45.6% Epithelialization:  None Tunneling: No Undermining: No Wound Description Full Thickness Without Exposed Support Classification: Structures Wound Margin: Flat and Intact Exudate Large Amount: Exudate Type: Serous Exudate Color: amber Foul Odor After Cleansing: No Slough/Fibrino Yes Wound Bed Granulation Amount: None Present (0%) Exposed Structure Necrotic Amount: Large (67-100%) Fascia Exposed: No Necrotic Quality: Adherent Slough Fat Layer (Subcutaneous Tissue) Exposed: Yes Tendon Exposed: No Muscle Exposed: No Joint Exposed: No Bone Exposed: No [name], [name]. ([id number]) Periwound Skin Texture Texture Color No Abnormalities Noted: No No Abnormalities Noted: No Callus: No Atrophie Blanche: No Crepitus: No Cyanosis: No Excoriation: No Ecchymosis: No Induration: No Erythema: Yes Rash: No Erythema Location: Circumferential Scarring: No Hemosiderin Staining: No Mottled: No Moisture Pallor: No No Abnormalities Noted: No Rubor: No Dry / Scaly: No Maceration: No Temperature / Pain Temperature: No Abnormality Tenderness on Palpation: Yes Wound Preparation Ulcer Cleansing: Rinsed/Irrigated with Saline Topical Anesthetic Applied: Other: lidocaine 4%, Treatment Notes Wound #1 (Right, Posterior Lower Leg) 1. Cleansed with: Clean wound with Normal Saline 2. Anesthetic Topical Lidocaine 4% cream to wound bed prior to debridement 3. Peri-wound Care: Skin Prep 4. Dressing Applied: Santyl Ointment 5. Secondary [place] Signature(s) Signed: 05/31/2017 11:51:08 AM By: Alric Quan Entered By: Alric Quan on 05/31/2017 08:09:07 [name] ([id number]) -------------------------------------------------------------------------------- [place] Details Patient Name: [name], [name]. Date of Service: 05/31/2017 8:15 AM Medical Record Number: [id number] Patient Account Number: [id number] Date of Birth/Sex: 1950/08/07 (66 y.o. Male) Treating RN:  Ahmed Prima Primary Care Jacqeline Broers: Alma Friendly Other Clinician: Referring Pamula Luther: Alma Friendly Treating Hetvi Shawhan/Extender: Frann Rider in Treatment: 4 Vital Signs Time Taken: 08:07 Temperature (F): 98.1 Height (in): 70 Pulse (bpm): 90 Weight (lbs): 302 Respiratory Rate (breaths/min): 20 Body Mass Index (BMI): 43.3 Blood Pressure (mmHg): 147/79 Reference Range: 80 - 120 mg / dl Electronic Signature(s) Signed: 05/31/2017 11:51:08 AM By: Alric Quan Entered By: Alric Quan on 05/31/2017 14:48:18

## 2017-06-11 ENCOUNTER — Encounter: Payer: BLUE CROSS/BLUE SHIELD | Admitting: Surgery

## 2017-06-11 DIAGNOSIS — E11622 Type 2 diabetes mellitus with other skin ulcer: Secondary | ICD-10-CM | POA: Diagnosis not present

## 2017-06-11 NOTE — Progress Notes (Signed)
VELDON, WAGER ([id number]) Visit Report for 06/11/2017 Arrival Information Details Patient Name: [name], [name]. Date of Service: 06/11/2017 9:15 AM Medical Record Number: [id number] Patient Account Number: [id number] Date of Birth/Sex: [birth year] (66 y.o. Male) Treating RN: Montey Hora Primary Care Karalina Tift: Alma Friendly Other Clinician: Referring Tyasia Packard: Alma Friendly Treating Ameliana Brashear/Extender: Frann Rider in Treatment: 6 Visit Information History Since Last Visit Added or deleted any medications: No Patient Arrived: Ambulatory Any new allergies or adverse reactions: No Arrival Time: 09:17 Had a fall or experienced change in No Accompanied By: self activities of daily living that may affect Transfer Assistance: None risk of falls: Patient Identification Verified: Yes Signs or symptoms of abuse/neglect since last visito No Secondary Verification Process Completed: Yes Hospitalized since last visit: No Patient Requires Transmission-Based No Has Dressing in Place as Prescribed: Yes Precautions: Pain Present Now: No Patient Has Alerts: Yes Patient Alerts: DMII Electronic Signature(s) Signed: 06/11/2017 2:45:49 PM By: Montey Hora Entered By: Montey Hora on 06/11/2017 09:18:09 [name] ([id number]) -------------------------------------------------------------------------------- Clinic Level of Care Assessment Details Patient Name: [name]. Date of Service: 06/11/2017 9:15 AM Medical Record Number: [id number] Patient Account Number: [id number] Date of Birth/Sex: July 25, 1950 (66 y.o. Male) Treating RN: Montey Hora Primary Care Kenta Laster: Alma Friendly Other Clinician: Referring Keylie Beavers: Alma Friendly Treating Charleston Hankin/Extender: Frann Rider in Treatment: 6 Clinic Level of Care Assessment Items TOOL 4 Quantity Score []  - Use when only an EandM is performed on FOLLOW-UP visit 0 ASSESSMENTS - Nursing Assessment /  Reassessment X - Reassessment of Co-morbidities (includes updates in patient status) 1 10 X- 1 5 Reassessment of Adherence to Treatment Plan ASSESSMENTS - Wound and Skin Assessment / Reassessment X - Simple Wound Assessment / Reassessment - one wound 1 5 []  - 0 Complex Wound Assessment / Reassessment - multiple wounds []  - 0 Dermatologic / Skin Assessment (not related to wound area) ASSESSMENTS - Focused Assessment []  - Circumferential Edema Measurements - multi extremities 0 []  - 0 Nutritional Assessment / Counseling / Intervention X- 1 5 Lower Extremity Assessment (monofilament, tuning fork, pulses) []  - 0 Peripheral Arterial Disease Assessment (using hand held doppler) ASSESSMENTS - Ostomy and/or Continence Assessment and Care []  - Incontinence Assessment and Management 0 []  - 0 Ostomy Care Assessment and Management (repouching, etc.) PROCESS - Coordination of Care X - Simple Patient / Family Education for ongoing care 1 15 []  - 0 Complex (extensive) Patient / Family Education for ongoing care []  - 0 Staff obtains Programmer, systems, Records, Test Results / Process Orders []  - 0 Staff telephones HHA, Nursing Homes / Clarify orders / etc []  - 0 Routine Transfer to another Facility (non-emergent condition) []  - 0 Routine Hospital Admission (non-emergent condition) []  - 0 New Admissions / Biomedical engineer / Ordering NPWT, Apligraf, etc. []  - 0 Emergency Hospital Admission (emergent condition) X- 1 10 Simple Discharge Coordination [name], [name] ([id number]) []  - 0 Complex (extensive) Discharge Coordination PROCESS - Special Needs []  - Pediatric / Minor Patient Management 0 []  - 0 Isolation Patient Management []  - 0 Hearing / Language / Visual special needs []  - 0 Assessment of Community assistance (transportation, D/C planning, etc.) []  - 0 Additional assistance / Altered mentation []  - 0 Support Surface(s) Assessment (bed, cushion, seat, etc.) INTERVENTIONS -  Wound Cleansing / Measurement X - Simple Wound Cleansing - one wound 1 5 []  - 0 Complex Wound Cleansing - multiple wounds X- 1 5 Wound Imaging (photographs - any number of wounds) []  - 0  Wound Tracing (instead of photographs) X- 1 5 Simple Wound Measurement - one wound []  - 0 Complex Wound Measurement - multiple wounds INTERVENTIONS - Wound Dressings X - Small Wound Dressing one or multiple wounds 1 10 []  - 0 Medium Wound Dressing one or multiple wounds []  - 0 Large Wound Dressing one or multiple wounds []  - 0 Application of Medications - topical []  - 0 Application of Medications - injection INTERVENTIONS - Miscellaneous []  - External ear exam 0 []  - 0 Specimen Collection (cultures, biopsies, blood, body fluids, etc.) []  - 0 Specimen(s) / Culture(s) sent or taken to Lab for analysis []  - 0 Patient Transfer (multiple staff / Civil Service fast streamer / Similar devices) []  - 0 Simple Staple / Suture removal (25 or less) []  - 0 Complex Staple / Suture removal (26 or more) []  - 0 Hypo / Hyperglycemic Management (close monitor of Blood Glucose) []  - 0 Ankle / Brachial Index (ABI) - do not check if billed separately X- 1 5 Vital Signs [name], [name] ([id number]) Has the patient been seen at the hospital within the last three years: Yes Total Score: 80 Level Of Care: New/Established - Level 3 Electronic Signature(s) Signed: 06/11/2017 2:45:49 PM By: Montey Hora Entered By: Montey Hora on 06/11/2017 10:50:06 [name] ([id number]) -------------------------------------------------------------------------------- Encounter Discharge Information Details Patient Name: [name]. Date of Service: 06/11/2017 9:15 AM Medical Record Number: [id number] Patient Account Number: [id number] Date of Birth/Sex: 1951/06/10 (66 y.o. Male) Treating RN: Montey Hora Primary Care Amit Leece: Alma Friendly Other Clinician: Referring Ciria Bernardini: Alma Friendly Treating Saladin Petrelli/Extender:  Frann Rider in Treatment: 6 Encounter Discharge Information Items Discharge Pain Level: 0 Discharge Condition: Stable Ambulatory Status: Cane Discharge Destination: Home Private Transportation: Auto Accompanied By: self Schedule Follow-up Appointment: Yes Medication Reconciliation completed and provided No to Patient/Care Morgan Rennert: Clinical Summary of Care: Electronic Signature(s) Signed: 06/11/2017 2:45:49 PM By: Montey Hora Entered By: Montey Hora on 06/11/2017 10:51:02 [name] ([id number]) -------------------------------------------------------------------------------- Lower Extremity Assessment Details Patient Name: [name]. Date of Service: 06/11/2017 9:15 AM Medical Record Number: [id number] Patient Account Number: [id number] Date of Birth/Sex: March 26, 1951 (66 y.o. Male) Treating RN: Montey Hora Primary Care Sherrina Zaugg: Alma Friendly Other Clinician: Referring Teryl Mcconaghy: Alma Friendly Treating Boen Sterbenz/Extender: Frann Rider in Treatment: 6 Vascular Assessment Pulses: Dorsalis Pedis Palpable: [Right:Yes] Posterior Tibial Extremity colors, hair growth, and conditions: Extremity Color: [Right:Normal] Hair Growth on Extremity: [Right:No] Temperature of Extremity: [Right:Warm] Capillary Refill: [Right:< 3 seconds] Electronic Signature(s) Signed: 06/11/2017 2:45:49 PM By: Montey Hora Entered By: Montey Hora on 06/11/2017 09:24:53 [name] ([id number]) -------------------------------------------------------------------------------- Multi Wound Chart Details Patient Name: [name]. Date of Service: 06/11/2017 9:15 AM Medical Record Number: [id number] Patient Account Number: [id number] Date of Birth/Sex: 08/23/1950 (66 y.o. Male) Treating RN: Montey Hora Primary Care Tamon Parkerson: Alma Friendly Other Clinician: Referring Joeziah Voit: Alma Friendly Treating Kalani Baray/Extender: Frann Rider in Treatment:  6 Vital Signs Height(in): 70 Pulse(bpm): 1 Weight(lbs): 302 Blood Pressure(mmHg): 139/76 Body Mass Index(BMI): 43 Temperature(F): 97.8 Respiratory Rate 18 (breaths/min): Photos: [1:No Photos] [N/A:N/A] Wound Location: [1:Right Lower Leg - Posterior] [N/A:N/A] Wounding Event: [1:Trauma] [N/A:N/A] Primary Etiology: [1:Trauma, Other] [N/A:N/A] Comorbid History: [1:Hypertension, Type II Diabetes, Osteoarthritis] [N/A:N/A] Date Acquired: [1:04/06/2017] [N/A:N/A] Weeks of Treatment: [1:6] [N/A:N/A] Wound Status: [1:Open] [N/A:N/A] Measurements L x W x D [1:2.3x0.5x0.1] [N/A:N/A] (cm) Area (cm) : [1:0.903] [N/A:N/A] Volume (cm) : [1:0.09] [N/A:N/A] % Reduction in Area: [1:65.20%] [N/A:N/A] % Reduction in Volume: [1:65.30%] [N/A:N/A] Classification: [1:Full Thickness Without Exposed Support Structures] [N/A:N/A]  Exudate Amount: [1:Large] [N/A:N/A] Exudate Type: [1:Serous] [N/A:N/A] Exudate Color: [1:amber] [N/A:N/A] Wound Margin: [1:Flat and Intact] [N/A:N/A] Granulation Amount: [1:Large (67-100%)] [N/A:N/A] Granulation Quality: [1:Pink] [N/A:N/A] Necrotic Amount: [1:Small (1-33%)] [N/A:N/A] Exposed Structures: [1:Fat Layer (Subcutaneous Tissue) Exposed: Yes Fascia: No Tendon: No Muscle: No Joint: No Bone: No] [N/A:N/A] Epithelialization: [1:Small (1-33%)] [N/A:N/A] Periwound Skin Texture: [1:Excoriation: No Induration: No Callus: No Crepitus: No] [N/A:N/A] Rash: No Scarring: No Periwound Skin Moisture: Maceration: No N/A N/A Dry/Scaly: No Periwound Skin Color: Erythema: Yes N/A N/A Atrophie Blanche: No Cyanosis: No Ecchymosis: No Hemosiderin Staining: No Mottled: No Pallor: No Rubor: No Erythema Location: Circumferential N/A N/A Temperature: No Abnormality N/A N/A Tenderness on Palpation: Yes N/A N/A Wound Preparation: Ulcer Cleansing: N/A N/A Rinsed/Irrigated with Saline Topical Anesthetic Applied: Other: lidocaine 4% Treatment Notes Electronic  Signature(s) Signed: 06/11/2017 9:39:44 AM By: Christin Fudge MD, FACS Entered By: Christin Fudge on 06/11/2017 09:39:43 [name], [name] ([id number]) -------------------------------------------------------------------------------- [place] Details Patient Name: [name], [name]. Date of Service: 06/11/2017 9:15 AM Medical Record Number: [id number] Patient Account Number: [id number] Date of Birth/Sex: February 27, 1951 (66 y.o. Male) Treating RN: Montey Hora Primary Care Lenea Bywater: Alma Friendly Other Clinician: Referring Donne Robillard: Alma Friendly Treating Rejoice Heatwole/Extender: Frann Rider in Treatment: 6 Active Inactive ` Nutrition Nursing Diagnoses: Impaired glucose control: actual or potential Goals: Patient/caregiver verbalizes understanding of need to maintain therapeutic glucose control per primary care physician Date Initiated: 04/27/2017 Target Resolution Date: 07/04/2017 Goal Status: Active Interventions: Assess HgA1c results as ordered upon admission and as needed Provide education on elevated blood sugars and impact on wound healing Notes: ` Orientation to the Wound Care Program Nursing Diagnoses: Knowledge deficit related to the wound healing center program Goals: Patient/caregiver will verbalize understanding of the [facility] Date Initiated: 04/27/2017 Target Resolution Date: 07/20/2017 Goal Status: Active Interventions: Provide education on orientation to the wound center Notes: ` Wound/Skin Impairment Nursing Diagnoses: Impaired tissue integrity Goals: Ulcer/skin breakdown will heal within 14 weeks Date Initiated: 04/27/2017 Target Resolution Date: 07/20/2017 Goal Status: Active [name], [name] ([id number]) Interventions: Assess patient/caregiver ability to obtain necessary supplies Assess patient/caregiver ability to perform ulcer/skin care regimen upon admission and as needed Assess ulceration(s) every  visit Notes: Electronic Signature(s) Signed: 06/11/2017 2:45:49 PM By: Montey Hora Entered By: Montey Hora on 06/11/2017 09:34:03 [name] ([id number]) -------------------------------------------------------------------------------- Pain Assessment Details Patient Name: [name]. Date of Service: 06/11/2017 9:15 AM Medical Record Number: [id number] Patient Account Number: [id number] Date of Birth/Sex: 09/14/1950 (66 y.o. Male) Treating RN: Montey Hora Primary Care Ziyah Cordoba: Alma Friendly Other Clinician: Referring Adrie Picking: Alma Friendly Treating Josafat Enrico/Extender: Frann Rider in Treatment: 6 Active Problems Location of Pain Severity and Description of Pain Patient Has Paino No Site Locations Pain Management and Medication Current Pain Management: Notes Topical or injectable lidocaine is offered to patient for acute pain when surgical debridement is performed. If needed, Patient is instructed to use over the counter pain medication for the following 24-48 hours after debridement. Wound care MDs do not prescribed pain medications. Patient has chronic pain or uncontrolled pain. Patient has been instructed to make an appointment with their Primary Care Physician for pain management. Electronic Signature(s) Signed: 06/11/2017 2:45:49 PM By: Montey Hora Entered By: Montey Hora on 06/11/2017 09:18:29 [name] ([id number]) -------------------------------------------------------------------------------- Patient/Caregiver Education Details Patient Name: [name], [name]. Date of Service: 06/11/2017 9:15 AM Medical Record Number: [id number] Patient Account Number: [id number] Date of Birth/Gender: 03/18/1951 (66 y.o. Male) Treating RN: Montey Hora Primary Care Physician: Carlis Abbott,  KATHERINE Other Clinician: Referring Physician: Alma Friendly Treating Physician/Extender: Frann Rider in Treatment: 6 Education Assessment Education  Provided To: Patient Education Topics Provided Wound/Skin Impairment: Handouts: Other: wound care as ordered Methods: Demonstration, Explain/Verbal Responses: State content correctly Electronic Signature(s) Signed: 06/11/2017 2:45:49 PM By: Montey Hora Entered By: Montey Hora on 06/11/2017 10:51:19 [name] ([id number]) -------------------------------------------------------------------------------- Wound Assessment Details Patient Name: [name]. Date of Service: 06/11/2017 9:15 AM Medical Record Number: [id number] Patient Account Number: [id number] Date of Birth/Sex: 1951/03/09 (66 y.o. Male) Treating RN: Montey Hora Primary Care Filomena Pokorney: Alma Friendly Other Clinician: Referring Jaelene Garciagarcia: Alma Friendly Treating Donyea Gafford/Extender: Frann Rider in Treatment: 6 Wound Status Wound Number: 1 Primary Etiology: Trauma, Other Wound Location: Right Lower Leg - Posterior Wound Status: Open Wounding Event: Trauma Comorbid Hypertension, Type II Diabetes, History: Osteoarthritis Date Acquired: 04/06/2017 Weeks Of Treatment: 6 Clustered Wound: No Wound Measurements Length: (cm) 2.3 Width: (cm) 0.5 Depth: (cm) 0.1 Area: (cm) 0.903 Volume: (cm) 0.09 % Reduction in Area: 65.2% % Reduction in Volume: 65.3% Epithelialization: Small (1-33%) Tunneling: No Undermining: No Wound Description Full Thickness Without Exposed Support Foul Odo Classification: Structures Slough/F Wound Margin: Flat and Intact Exudate Large Amount: Exudate Type: Serous Exudate Color: amber r After Cleansing: No ibrino Yes Wound Bed Granulation Amount: Large (67-100%) Exposed Structure Granulation Quality: Pink Fascia Exposed: No Necrotic Amount: Small (1-33%) Fat Layer (Subcutaneous Tissue) Exposed: Yes Necrotic Quality: Adherent Slough Tendon Exposed: No Muscle Exposed: No Joint Exposed: No Bone Exposed: No Periwound Skin Texture Texture Color No  Abnormalities Noted: No No Abnormalities Noted: No Callus: No Atrophie Blanche: No Crepitus: No Cyanosis: No Excoriation: No Ecchymosis: No Induration: No Erythema: Yes Rash: No Erythema Location: Circumferential Scarring: No Hemosiderin Staining: No Mottled: No Moisture Pallor: No No Abnormalities Noted: No Rubor: No Dry / Scaly: No [name], [name] ([id number]) Maceration: No Temperature / Pain Temperature: No Abnormality Tenderness on Palpation: Yes Wound Preparation Ulcer Cleansing: Rinsed/Irrigated with Saline Topical Anesthetic Applied: Other: lidocaine 4%, Treatment Notes Wound #1 (Right, Posterior Lower Leg) 1. Cleansed with: Clean wound with Normal Saline 2. Anesthetic Topical Lidocaine 4% cream to wound bed prior to debridement 4. Dressing Applied: Santyl Ointment 5. Secondary Dressing Applied Dry Ramona Signature(s) Signed: 06/11/2017 2:45:49 PM By: Montey Hora Entered By: Montey Hora on 06/11/2017 09:24:35 [name] ([id number]) -------------------------------------------------------------------------------- [place] Details Patient Name: [name]. Date of Service: 06/11/2017 9:15 AM Medical Record Number: [id number] Patient Account Number: [id number] Date of Birth/Sex: 12/06/1950 (66 y.o. Male) Treating RN: Montey Hora Primary Care Darianny Momon: Alma Friendly Other Clinician: Referring Arash Karstens: Alma Friendly Treating Kathy Wahid/Extender: Frann Rider in Treatment: 6 Vital Signs Time Taken: 09:21 Temperature (F): 97.8 Height (in): 70 Pulse (bpm): 84 Weight (lbs): 302 Respiratory Rate (breaths/min): 18 Body Mass Index (BMI): 43.3 Blood Pressure (mmHg): 139/76 Reference Range: 80 - 120 mg / dl Electronic Signature(s) Signed: 06/11/2017 2:45:49 PM By: Montey Hora Entered By: Montey Hora on 06/11/2017 09:21:11

## 2017-06-11 NOTE — Progress Notes (Signed)
MAKSYM, PFIFFNER ([id number]) Visit Report for 06/11/2017 Chief Complaint Document Details Patient Name: [name], [name]. Date of Service: 06/11/2017 9:15 AM Medical Record Number: [id number] Patient Account Number: [id number] Date of Birth/Sex: 09/28/1950 (66 y.o. Male) Treating RN: Montey Hora Primary Care Provider: Alma Friendly Other Clinician: Referring Provider: Alma Friendly Treating Provider/Extender: Frann Rider in Treatment: 6 Information Obtained from: Patient Chief Complaint Patient seen for complaints of Non-Healing Wound to the right calf which he sustained about 3 weeks ago Electronic Signature(s) Signed: 06/11/2017 9:39:51 AM By: Christin Fudge MD, FACS Entered By: Christin Fudge on 06/11/2017 09:39:51 [name], [name] ([id number]) -------------------------------------------------------------------------------- HPI Details Patient Name: [name]. Date of Service: 06/11/2017 9:15 AM Medical Record Number: [id number] Patient Account Number: [id number] Date of Birth/Sex: 03/13/1951 (66 y.o. Male) Treating RN: Montey Hora Primary Care Provider: Alma Friendly Other Clinician: Referring Provider: Alma Friendly Treating Provider/Extender: Frann Rider in Treatment: 6 History of Present Illness Location: right calf Quality: Patient reports experiencing a dull pain to affected area(s). Severity: Patient states wound are getting worse. Duration: Patient has had the wound for < 4 weeks prior to presenting for treatment Timing: Pain in wound is Intermittent (comes and goes Context: The wound occurred when the patient had a laceration with a tractor Modifying Factors: Other treatment(s) tried include:oral antibiotics and Bactroban ointment Associated Signs and Symptoms: Patient reports having increase discharge. HPI Description: 66 year old male with a history of diabetes mellitus type 2 has been seeing his PCP for lower extremity laceration and  was initially seen on September 20. 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 The nurse practitioner at the practice was concerned about her venous stasis ulcer and referred him to the wound center. He is a former smoker who quit in 2016 and has a past medical history of arthritis, bronchitis, colon polyps, diabetes mellitus and hypertension Last hemoglobin A1c about a year ago was 7.5%. He says he's had a more recent hemoglobin A1c done and it was about 7.2% 05/03/2017 -- he was unable to get Santyl through his insurance and hence he's been using Medihoney. 05/31/2017 -- due to the lack of insurance covering Santyl we have not been able to use this over the month and I have strongly recommended Santyl ointment again as he is not making adequate progress. We will do appeared to peer if required and given him all the possible coupons so that he is able to get hold of this. 06/11/2017 -- he was able to get Santyl ointment and has been using this. Electronic Signature(s) Signed: 06/11/2017 9:41:08 AM By: Christin Fudge MD, FACS Entered By: Christin Fudge on 06/11/2017 09:41:07 [name], [name] ([id number]) -------------------------------------------------------------------------------- Physical Exam Details Patient Name: [name], [name]. Date of Service: 06/11/2017 9:15 AM Medical Record Number: [id number] Patient Account Number: [id number] Date of Birth/Sex: 10-17-1950 (66 y.o. Male) Treating RN: Montey Hora Primary Care Provider: Alma Friendly Other Clinician: Referring Provider: Alma Friendly Treating Provider/Extender: Frann Rider in Treatment: 6 Constitutional . Pulse regular. Respirations normal and unlabored. Afebrile. . Eyes Nonicteric. Reactive to light. Ears, Nose, Mouth, and Throat Lips, teeth, and gums WNL.Marland Kitchen Moist mucosa without lesions. Neck supple and nontender. No  palpable supraclavicular or cervical adenopathy. Normal sized without goiter. Respiratory WNL. No retractions.. Cardiovascular Pedal Pulses WNL. No clubbing, cyanosis or edema. Lymphatic No adneopathy. No adenopathy. No adenopathy. Musculoskeletal Adexa without tenderness or enlargement.. Digits and nails  w/o clubbing, cyanosis, infection, petechiae, ischemia, or inflammatory conditions.. Integumentary (Hair, Skin) No suspicious lesions. No crepitus or fluctuance. No peri-wound warmth or erythema. No masses.Marland Kitchen Psychiatric Judgement and insight Intact.. No evidence of depression, anxiety, or agitation.. Notes wound is looking much cleaner today and sharp debridement was not required. Electronic Signature(s) Signed: 06/11/2017 9:41:40 AM By: Christin Fudge MD, FACS Entered By: Christin Fudge on 06/11/2017 09:41:39 [name] ([id number]) -------------------------------------------------------------------------------- Physician Orders Details Patient Name: [name], [name]. Date of Service: 06/11/2017 9:15 AM Medical Record Number: [id number] Patient Account Number: [id number] Date of Birth/Sex: [birth year] (66 y.o. Male) Treating RN: Montey Hora Primary Care Provider: Alma Friendly Other Clinician: Referring Provider: Alma Friendly Treating Provider/Extender: Frann Rider in Treatment: 6 Verbal / Phone Orders: No Diagnosis Coding Wound Cleansing Wound #1 Right,Posterior Lower Leg o Clean wound with Normal Saline. o May Shower, gently pat wound dry prior to applying new dressing. Anesthetic Wound #1 Right,Posterior Lower Leg o Topical Lidocaine 4% cream applied to wound bed prior to debridement Primary Wound Dressing Wound #1 Right,Posterior Lower Leg o Santyl Ointment - in clinic o Medihoney gel - at home Secondary Dressing Wound #1 Right,Posterior Lower Leg o Boardered Foam Dressing - or telfa island dressing Dressing Change Frequency Wound #1  Right,Posterior Lower Leg o Change dressing every day. Follow-up Appointments Wound #1 Right,Posterior Lower Leg o Return Appointment in 1 week. Edema Control Wound #1 Right,Posterior Lower Leg o Elevate legs to the level of the heart and pump ankles as often as possible Additional Orders / Instructions Wound #1 Right,Posterior Lower Leg o Increase protein intake. o Other: - Try to keep blood sugars below 180 and please add vitamin A, vitamin C and zinc supplements to your diet Medications-please add to medication list. Wound #1 Right,Posterior Lower Leg o [address] FLETCHER, OSTERMILLER ([id number]) Electronic Signature(s) Signed: 06/11/2017 12:10:21 PM By: Christin Fudge MD, FACS Signed: 06/11/2017 2:45:49 PM By: Montey Hora Entered By: Montey Hora on 06/11/2017 09:34:56 JARMARCUS, WAMBOLD ([id number]) -------------------------------------------------------------------------------- Problem List Details Patient Name: [name], [name]. Date of Service: 06/11/2017 9:15 AM Medical Record Number: [id number] Patient Account Number: [id number] Date of Birth/Sex: Jul 21, 1950 (66 y.o. Male) Treating RN: Montey Hora Primary Care Provider: Alma Friendly Other Clinician: Referring Provider: Alma Friendly Treating Provider/Extender: Frann Rider in Treatment: 6 Active Problems ICD-10 Encounter Code Description Active Date Diagnosis E11.622 Type 2 diabetes mellitus with other skin ulcer 04/27/2017 Yes S81.811A Laceration without foreign body, right lower leg, initial encounter 04/27/2017 Yes L97.212 Non-pressure chronic ulcer of right calf with fat layer exposed 04/27/2017 Yes E66.01 Morbid (severe) obesity due to excess calories 04/27/2017 Yes Inactive Problems Resolved Problems Electronic Signature(s) Signed: 06/11/2017 9:39:39 AM By: Christin Fudge MD, FACS Entered By: Christin Fudge on 06/11/2017 09:39:39 [name]  ([id number]) -------------------------------------------------------------------------------- Progress Note Details Patient Name: [name]. Date of Service: 06/11/2017 9:15 AM Medical Record Number: [id number] Patient Account Number: [id number] Date of Birth/Sex: 07/05/1951 (66 y.o. Male) Treating RN: Montey Hora Primary Care Provider: Alma Friendly Other Clinician: Referring Provider: Alma Friendly Treating Provider/Extender: Frann Rider in Treatment: 6 Subjective Chief Complaint Information obtained from Patient Patient seen for complaints of Non-Healing Wound to the right calf which he sustained about 3 weeks ago History of Present Illness (HPI) The following HPI elements were documented for the patient's wound: Location: right calf Quality: Patient reports experiencing a dull pain to affected area(s). Severity: Patient states wound are getting worse. Duration: Patient has had the wound for <  4 weeks prior to presenting for treatment Timing: Pain in wound is Intermittent (comes and goes Context: The wound occurred when the patient had a laceration with a tractor Modifying Factors: Other treatment(s) tried include:oral antibiotics and Bactroban ointment Associated Signs and Symptoms: Patient reports having increase discharge. 66 year old male with a history of diabetes mellitus type 2 has been seeing his PCP for lower extremity laceration and was initially seen on September 20. 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 The nurse practitioner at the practice was concerned about her venous stasis ulcer and referred him to the wound center. He is a former smoker who quit in 2016 and has a past medical history of arthritis, bronchitis, colon polyps, diabetes mellitus and hypertension Last hemoglobin A1c about a year ago was 7.5%. He says he's had a more recent hemoglobin A1c  done and it was about 7.2% 05/03/2017 -- he was unable to get Santyl through his insurance and hence he's been using Medihoney. 05/31/2017 -- due to the lack of insurance covering Santyl we have not been able to use this over the month and I have strongly recommended Santyl ointment again as he is not making adequate progress. We will do appeared to peer if required and given him all the possible coupons so that he is able to get hold of this. 06/11/2017 -- he was able to get Santyl ointment and has been using this. Patient History Information obtained from Patient. Family History Diabetes - Mother, Stroke - Mother,Father, No family history of Cancer, Heart Disease, Hereditary Spherocytosis, Hypertension, Kidney Disease, Lung Disease, Seizures, Thyroid Problems, Tuberculosis. Social History Former smoker - 2+ years ago, Marital Status - Married, Alcohol Use - Never, Drug Use - No History, Caffeine Use - Daily. Medical And Surgical History Notes Respiratory bronchitis [name], [name]. ([id number]) Objective Constitutional Pulse regular. Respirations normal and unlabored. Afebrile. Vitals Time Taken: 9:21 AM, Height: 70 in, Weight: 302 lbs, BMI: 43.3, Temperature: 97.8 F, Pulse: 84 bpm, Respiratory Rate: 18 breaths/min, Blood Pressure: 139/76 mmHg. Eyes Nonicteric. Reactive to light. Ears, Nose, Mouth, and Throat Lips, teeth, and gums WNL.Marland Kitchen Moist mucosa without lesions. Neck supple and nontender. No palpable supraclavicular or cervical adenopathy. Normal sized without goiter. Respiratory WNL. No retractions.. Cardiovascular Pedal Pulses WNL. No clubbing, cyanosis or edema. Lymphatic No adneopathy. No adenopathy. No adenopathy. Musculoskeletal Adexa without tenderness or enlargement.. Digits and nails w/o clubbing, cyanosis, infection, petechiae, ischemia, or inflammatory conditions.Marland Kitchen Psychiatric Judgement and insight Intact.. No evidence of depression, anxiety, or  agitation.. General Notes: wound is looking much cleaner today and sharp debridement was not required. Integumentary (Hair, Skin) No suspicious lesions. No crepitus or fluctuance. No peri-wound warmth or erythema. No masses.. Wound #1 status is Open. Original cause of wound was Trauma. The wound is located on the Right,Posterior Lower Leg. The wound measures 2.3cm length x 0.5cm width x 0.1cm depth; 0.903cm^2 area and 0.09cm^3 volume. There is Fat Layer (Subcutaneous Tissue) Exposed exposed. There is no tunneling or undermining noted. There is a large amount of serous drainage noted. The wound margin is flat and intact. There is large (67-100%) pink granulation within the wound bed. There is a small (1-33%) amount of necrotic tissue within the wound bed including Adherent Slough. The periwound skin appearance exhibited: Erythema. The periwound skin appearance did not exhibit: Callus, Crepitus, Excoriation, Induration, Rash, Scarring, Dry/Scaly, Maceration, Atrophie Blanche, Cyanosis, Ecchymosis, Hemosiderin Staining, Mottled, Pallor,  Rubor. The surrounding wound skin color is noted with erythema which is circumferential. Periwound temperature was noted as No [name], [name]. ([id number]) Abnormality. The periwound has tenderness on palpation. Assessment Active Problems ICD-10 E11.622 - Type 2 diabetes mellitus with other skin ulcer S81.811A - Laceration without foreign body, right lower leg, initial encounter L97.212 - Non-pressure chronic ulcer of right calf with fat layer exposed E66.01 - Morbid (severe) obesity due to excess calories Plan Wound Cleansing: Wound #1 Right,Posterior Lower Leg: Clean wound with Normal Saline. May Shower, gently pat wound dry prior to applying new dressing. Anesthetic: Wound #1 Right,Posterior Lower Leg: Topical Lidocaine 4% cream applied to wound bed prior to debridement Primary Wound Dressing: Wound #1 Right,Posterior Lower Leg: Santyl Ointment - in  clinic Medihoney gel - at home Secondary Dressing: Wound #1 Right,Posterior Lower Leg: Boardered Foam Dressing - or telfa island dressing Dressing Change Frequency: Wound #1 Right,Posterior Lower Leg: Change dressing every day. Follow-up Appointments: Wound #1 Right,Posterior Lower Leg: Return Appointment in 1 week. Edema Control: Wound #1 Right,Posterior Lower Leg: Elevate legs to the level of the heart and pump ankles as often as possible Additional Orders / Instructions: Wound #1 Right,Posterior Lower Leg: Increase protein intake. Other: - Try to keep blood sugars below 180 and please add vitamin A, vitamin C and zinc supplements to your diet Medications-please add to medication list.: Wound #1 Right,Posterior Lower Leg: [address] HRITHIK, [name] ([id number]) After review I have recommended: 1. Santyl ointment locally, to be applied daily after washing with soap and water 2. Good control of his diabetes mellitus 3. Adequate protein, vitamin A, vitamin C and zinc 4. Regular visits to the wound center. Electronic Signature(s) Signed: 06/11/2017 9:42:29 AM By: Christin Fudge MD, FACS Entered By: Christin Fudge on 06/11/2017 09:42:29 [name], [name] ([id number]) -------------------------------------------------------------------------------- ROS/PFSH Details Patient Name: [name], [name]. Date of Service: 06/11/2017 9:15 AM Medical Record Number: [id number] Patient Account Number: [id number] Date of Birth/Sex: 09-09-1950 (66 y.o. Male) Treating RN: Montey Hora Primary Care Provider: Alma Friendly Other Clinician: Referring Provider: Alma Friendly Treating Provider/Extender: Frann Rider in Treatment: 6 Information Obtained From Patient Wound History Do you currently have one or more open woundso Yes How many open wounds do you currently haveo 1 Approximately how long have you had your woundso 3 weeks How have you been treating your wound(s) until  nowo dry dressing Has your wound(s) ever healed and then re-openedo No Have you had any lab work done in the past montho No Have you tested positive for an antibiotic resistant organism (MRSA, VRE)o No Have you tested positive for osteomyelitis (bone infection)o No Have you had any tests for circulation on your legso No Have you had other problems associated with your woundso Infection Respiratory Medical History: Past Medical History Notes: bronchitis Cardiovascular Medical History: Positive for: Hypertension Endocrine Medical History: Positive for: Type II Diabetes Time with diabetes: since 1997 Treated with: Insulin Blood sugar tested every day: Yes Tested : Musculoskeletal Medical History: Positive for: Osteoarthritis Immunizations Pneumococcal Vaccine: Received Pneumococcal Vaccination: Yes Immunization Notes: up to date Implantable Devices Family and Social History [name], ARIZMENDI ([id number]) Cancer: No; Diabetes: Yes - Mother; Heart Disease: No; Hereditary Spherocytosis: No; Hypertension: No; Kidney Disease: No; Lung Disease: No; Seizures: No; Stroke: Yes - Mother,Father; Thyroid Problems: No; Tuberculosis: No; Former smoker - 2+ years ago; Marital Status - Married; Alcohol Use: Never; Drug Use: No History; Caffeine Use: Daily; Financial Concerns: No; Food, Clothing or Shelter Needs: No;  Support System Lacking: No; Transportation Concerns: No; Advanced Directives: No; Patient does not want information on Advanced Directives Physician Affirmation I have reviewed and agree with the above information. Electronic Signature(s) Signed: 06/11/2017 12:10:21 PM By: Christin Fudge MD, FACS Signed: 06/11/2017 2:45:49 PM By: Montey Hora Entered By: Christin Fudge on 06/11/2017 09:41:16 KIMBALL, APPLEBY ([id number]) -------------------------------------------------------------------------------- SuperBill Details Patient Name: NOWELL, SITES. Date of Service:  06/11/2017 Medical Record Number: [id number] Patient Account Number: [id number] Date of Birth/Sex: 1951/07/04 (66 y.o. Male) Treating RN: Montey Hora Primary Care Provider: Alma Friendly Other Clinician: Referring Provider: Alma Friendly Treating Provider/Extender: Frann Rider in Treatment: 6 Diagnosis Coding ICD-10 Codes Code Description [phone] Type 2 diabetes mellitus with other skin ulcer S81.811A Laceration without foreign body, right lower leg, initial encounter L97.212 Non-pressure chronic ulcer of right calf with fat layer exposed E66.01 Morbid (severe) obesity due to excess calories Facility Procedures CPT4 Code: [id number] Description: 99213 - WOUND CARE VISIT-LEV 3 EST PT Modifier: Quantity: 1 Physician Procedures CPT4 Code: [id number] Description: 38177 - WC PHYS LEVEL 3 - EST PT ICD-10 Diagnosis Description E11.622 Type 2 diabetes mellitus with other skin ulcer S81.811A Laceration without foreign body, right lower leg, initial en L97.212 Non-pressure chronic ulcer of right calf with  fat layer expo E66.01 Morbid (severe) obesity due to excess calories Modifier: counter sed Quantity: 1 Electronic Signature(s) Signed: 06/11/2017 12:10:21 PM By: Christin Fudge MD, FACS Signed: 06/11/2017 2:45:49 PM By: Montey Hora Previous Signature: 06/11/2017 9:42:50 AM Version By: Christin Fudge MD, FACS Entered By: Montey Hora on 06/11/2017 10:50:17

## 2017-06-18 ENCOUNTER — Telehealth: Payer: Self-pay | Admitting: *Deleted

## 2017-06-18 ENCOUNTER — Encounter: Payer: BLUE CROSS/BLUE SHIELD | Attending: Physician Assistant | Admitting: Physician Assistant

## 2017-06-18 DIAGNOSIS — E11622 Type 2 diabetes mellitus with other skin ulcer: Secondary | ICD-10-CM | POA: Insufficient documentation

## 2017-06-18 DIAGNOSIS — L97212 Non-pressure chronic ulcer of right calf with fat layer exposed: Secondary | ICD-10-CM | POA: Insufficient documentation

## 2017-06-18 DIAGNOSIS — I10 Essential (primary) hypertension: Secondary | ICD-10-CM | POA: Insufficient documentation

## 2017-06-18 DIAGNOSIS — Z87891 Personal history of nicotine dependence: Secondary | ICD-10-CM

## 2017-06-18 DIAGNOSIS — M1990 Unspecified osteoarthritis, unspecified site: Secondary | ICD-10-CM | POA: Diagnosis not present

## 2017-06-18 DIAGNOSIS — Z794 Long term (current) use of insulin: Secondary | ICD-10-CM | POA: Diagnosis not present

## 2017-06-18 DIAGNOSIS — X58XXXA Exposure to other specified factors, initial encounter: Secondary | ICD-10-CM | POA: Diagnosis not present

## 2017-06-18 DIAGNOSIS — S81811A Laceration without foreign body, right lower leg, initial encounter: Secondary | ICD-10-CM | POA: Diagnosis not present

## 2017-06-18 DIAGNOSIS — Z6841 Body Mass Index (BMI) 40.0 and over, adult: Secondary | ICD-10-CM | POA: Insufficient documentation

## 2017-06-18 DIAGNOSIS — Z122 Encounter for screening for malignant neoplasm of respiratory organs: Secondary | ICD-10-CM

## 2017-06-18 NOTE — Telephone Encounter (Signed)
Notified patient that annual lung cancer screening low dose CT scan is due currently or will be in near future. Confirmed that patient is within the age range of 55-77, and asymptomatic, (no signs or symptoms of lung cancer). Patient denies illness that would prevent curative treatment for lung cancer if found. Verified smoking history, (former, quit 12/11/14, 70 pack year). The shared decision making visit was done 06/27/16. Patient is agreeable for CT scan being scheduled.

## 2017-06-19 NOTE — Progress Notes (Signed)
MATIN, MATTIOLI ([id number]) Visit Report for 06/18/2017 Chief Complaint Document Details Patient Name: [name], [name]. Date of Service: 06/18/2017 9:15 AM Medical Record Number: [id number] Patient Account Number: [id number] Date of Birth/Sex: 1950/08/20 (66 y.o. Male) Treating RN: Montey Hora Primary Care Provider: Alma Friendly Other Clinician: Referring Provider: Alma Friendly Treating Provider/Extender: Melburn Hake, Safire Gordin Weeks in Treatment: 7 Information Obtained from: Patient Chief Complaint Patient seen for complaints of Non-Healing Wound to the right calf which he sustained Electronic Signature(s) Signed: 06/18/2017 5:27:57 PM By: Worthy Keeler PA-C Entered By: Worthy Keeler on 06/18/2017 10:51:58 [name], [name] ([id number]) -------------------------------------------------------------------------------- HPI Details Patient Name: [name]. Date of Service: 06/18/2017 9:15 AM Medical Record Number: [id number] Patient Account Number: [id number] Date of Birth/Sex: 1951-04-11 (66 y.o. Male) Treating RN: Montey Hora Primary Care Provider: Alma Friendly Other Clinician: Referring Provider: Alma Friendly Treating Provider/Extender: Melburn Hake, Willona Phariss Weeks in Treatment: 7 History of Present Illness Location: right calf Quality: Patient reports experiencing a dull pain to affected area(s). Severity: Patient states wound are getting worse. Duration: Patient has had the wound for < 4 weeks prior to presenting for treatment Timing: Pain in wound is Intermittent (comes and goes Context: The wound occurred when the patient had a laceration with a tractor Modifying Factors: Other treatment(s) tried include:oral antibiotics and Bactroban ointment Associated Signs and Symptoms: Patient reports having increase discharge. HPI Description: 66 year old male with a history of diabetes mellitus type 2 has been seeing his PCP for lower extremity laceration and was initially seen  on September 20. 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 The nurse practitioner at the practice was concerned about her venous stasis ulcer and referred him to the wound center. He is a former smoker who quit in 2016 and has a past medical history of arthritis, bronchitis, colon polyps, diabetes mellitus and hypertension Last hemoglobin A1c about a year ago was 7.5%. He says he's had a more recent hemoglobin A1c done and it was about 7.2% 05/03/2017 -- he was unable to get Santyl through his insurance and hence he's been using Medihoney. 05/31/2017 -- due to the lack of insurance covering Santyl we have not been able to use this over the month and I have strongly recommended Santyl ointment again as he is not making adequate progress. We will do appeared to peer if required and given him all the possible coupons so that he is able to get hold of this. 06/11/2017 -- he was able to get Santyl ointment and has been using this. 06/18/17 on evaluation today patient appears to be doing well in regard to his right calf wound immediately. The wound does not appear to be significantly smaller today although he has noted some improvement and he tells me he is not having any discomfort. No fevers, chills, nausea, or vomiting noted at this time. He has been tolerating the dressing changes without complication. Electronic Signature(s) Signed: 06/18/2017 5:27:57 PM By: Worthy Keeler PA-C Entered By: Worthy Keeler on 06/18/2017 10:54:07 [name], [name] ([id number]) -------------------------------------------------------------------------------- Physical Exam Details Patient Name: [name], [name]. Date of Service: 06/18/2017 9:15 AM Medical Record Number: [id number] Patient Account Number: [id number] Date of Birth/Sex: [birth year] (66 y.o. Male) Treating RN: Montey Hora Primary Care Provider: Alma Friendly  Other Clinician: Referring Provider: Alma Friendly Treating Provider/Extender: Melburn Hake, Elsey Holts Weeks in Treatment: 7 Constitutional Obese and well-hydrated in no acute distress.  Respiratory normal breathing without difficulty. clear to auscultation bilaterally. Cardiovascular regular rate and rhythm with normal S1, S2. trace pitting edema of the bilateral lower extremities. Psychiatric this patient is able to make decisions and demonstrates good insight into disease process. Alert and Oriented x 3. pleasant and cooperative. Notes Appears to have a fairly good granular bed and no slough of noted at this point. I think that we can discontinue the Santyl and no debridement was required today. Electronic Signature(s) Signed: 06/18/2017 5:27:57 PM By: Worthy Keeler PA-C Entered By: Worthy Keeler on 06/18/2017 10:54:59 [name] ([id number]) -------------------------------------------------------------------------------- Physician Orders Details Patient Name: [name], [name]. Date of Service: 06/18/2017 9:15 AM Medical Record Number: [id number] Patient Account Number: [id number] Date of Birth/Sex: 05/17/1951 (66 y.o. Male) Treating RN: Montey Hora Primary Care Provider: Alma Friendly Other Clinician: Referring Provider: Alma Friendly Treating Provider/Extender: Melburn Hake, Rollan Roger Weeks in Treatment: 7 Verbal / Phone Orders: No Diagnosis Coding ICD-10 Coding Code Description E11.622 Type 2 diabetes mellitus with other skin ulcer S81.811A Laceration without foreign body, right lower leg, initial encounter L97.212 Non-pressure chronic ulcer of right calf with fat layer exposed E66.01 Morbid (severe) obesity due to excess calories Wound Cleansing Wound #1 Right,Posterior Lower Leg o Clean wound with Normal Saline. o May Shower, gently pat wound dry prior to applying new dressing. Anesthetic Wound #1 Right,Posterior Lower Leg o Topical Lidocaine 4% cream applied to  wound bed prior to debridement Primary Wound Dressing Wound #1 Right,Posterior Lower Leg o Silvercel Non-Adherent Secondary Dressing Wound #1 Right,Posterior Lower Leg o Boardered Foam Dressing - or telfa island dressing Dressing Change Frequency Wound #1 Right,Posterior Lower Leg o Change dressing every other day. Follow-up Appointments Wound #1 Right,Posterior Lower Leg o Return Appointment in 1 week. Edema Control Wound #1 Right,Posterior Lower Leg o Elevate legs to the level of the heart and pump ankles as often as possible Additional Orders / Instructions Wound #1 Right,Posterior Lower Leg o Increase protein intake. o Other: - Try to keep blood sugars below 180 and please add vitamin A, vitamin C and zinc supplements to your diet [name], [name] ([id number]) Electronic Signature(s) Signed: 06/18/2017 4:00:26 PM By: Montey Hora Signed: 06/18/2017 5:27:57 PM By: Worthy Keeler PA-C Entered By: Montey Hora on 06/18/2017 10:04:39 [name] ([id number]) -------------------------------------------------------------------------------- Problem List Details Patient Name: [name], [name]. Date of Service: 06/18/2017 9:15 AM Medical Record Number: [id number] Patient Account Number: [id number] Date of Birth/Sex: [birth year] (66 y.o. Male) Treating RN: Montey Hora Primary Care Provider: Alma Friendly Other Clinician: Referring Provider: Alma Friendly Treating Provider/Extender: Melburn Hake, Cowan Pilar Weeks in Treatment: 7 Active Problems ICD-10 Encounter Code Description Active Date Diagnosis E11.622 Type 2 diabetes mellitus with other skin ulcer 04/27/2017 Yes S81.811A Laceration without foreign body, right lower leg, initial encounter 04/27/2017 Yes L97.212 Non-pressure chronic ulcer of right calf with fat layer exposed 04/27/2017 Yes E66.01 Morbid (severe) obesity due to excess calories 04/27/2017 Yes Inactive Problems Resolved Problems Electronic  Signature(s) Signed: 06/18/2017 5:27:57 PM By: Worthy Keeler PA-C Entered By: Worthy Keeler on 06/18/2017 09:59:54 [name] ([id number]) -------------------------------------------------------------------------------- Progress Note Details Patient Name: [name]. Date of Service: 06/18/2017 9:15 AM Medical Record Number: [id number] Patient Account Number: [id number] Date of Birth/Sex: [birth year] (66 y.o. Male) Treating RN: Montey Hora Primary Care Provider: Alma Friendly Other Clinician: Referring Provider: Alma Friendly Treating Provider/Extender: Melburn Hake, Hyland Mollenkopf Weeks in Treatment: 7 Subjective Chief Complaint Information obtained from Patient Patient seen for complaints of Non-Healing  Wound to the right calf which he sustained History of Present Illness (HPI) The following HPI elements were documented for the patient's wound: Location: right calf Quality: Patient reports experiencing a dull pain to affected area(s). Severity: Patient states wound are getting worse. Duration: Patient has had the wound for < 4 weeks prior to presenting for treatment Timing: Pain in wound is Intermittent (comes and goes Context: The wound occurred when the patient had a laceration with a tractor Modifying Factors: Other treatment(s) tried include:oral antibiotics and Bactroban ointment Associated Signs and Symptoms: Patient reports having increase discharge. 66 year old male with a history of diabetes mellitus type 2 has been seeing his PCP for lower extremity laceration and was initially seen on September 20. 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 The nurse practitioner at the practice was concerned about her venous stasis ulcer and referred him to the wound center. He is a former smoker who quit in 2016 and has a past medical history of arthritis, bronchitis, colon polyps, diabetes  mellitus and hypertension Last hemoglobin A1c about a year ago was 7.5%. He says he's had a more recent hemoglobin A1c done and it was about 7.2% 05/03/2017 -- he was unable to get Santyl through his insurance and hence he's been using Medihoney. 05/31/2017 -- due to the lack of insurance covering Santyl we have not been able to use this over the month and I have strongly recommended Santyl ointment again as he is not making adequate progress. We will do appeared to peer if required and given him all the possible coupons so that he is able to get hold of this. 06/11/2017 -- he was able to get Santyl ointment and has been using this. 06/18/17 on evaluation today patient appears to be doing well in regard to his right calf wound immediately. The wound does not appear to be significantly smaller today although he has noted some improvement and he tells me he is not having any discomfort. No fevers, chills, nausea, or vomiting noted at this time. He has been tolerating the dressing changes without complication. Patient History Information obtained from Patient. Family History Diabetes - Mother, Stroke - Mother,Father, No family history of Cancer, Heart Disease, Hereditary Spherocytosis, Hypertension, Kidney Disease, Lung Disease, Seizures, Thyroid Problems, Tuberculosis. Social History INDIE, NICKERSON ([id number]) Former smoker - 2+ years ago, Marital Status - Married, Alcohol Use - Never, Drug Use - No History, Caffeine Use - Daily. Medical And Surgical History Notes Respiratory bronchitis Review of Systems (ROS) Constitutional Symptoms (General Health) Denies complaints or symptoms of Fever, Chills. Respiratory The patient has no complaints or symptoms. Cardiovascular Complains or has symptoms of LE edema. Psychiatric The patient has no complaints or symptoms. Objective Constitutional Obese and well-hydrated in no acute distress. Vitals Time Taken: 9:33 AM, Height: 70 in, Weight: 302  lbs, BMI: 43.3, Temperature: 97.9 F, Pulse: 84 bpm, Respiratory Rate: 20 breaths/min, Blood Pressure: 160/74 mmHg. Respiratory normal breathing without difficulty. clear to auscultation bilaterally. Cardiovascular regular rate and rhythm with normal S1, S2. trace pitting edema of the bilateral lower extremities. Psychiatric this patient is able to make decisions and demonstrates good insight into disease process. Alert and Oriented x 3. pleasant and cooperative. General Notes: Appears to have a fairly good granular bed and no slough of noted at this point. I think that we can discontinue the Santyl and no debridement was required today. Integumentary (Hair,  Skin) Wound #1 status is Open. Original cause of wound was Trauma. The wound is located on the Right,Posterior Lower Leg. The wound measures 2.2cm length x 0.5cm width x 0.1cm depth; 0.864cm^2 area and 0.086cm^3 volume. There is Fat Layer (Subcutaneous Tissue) Exposed exposed. There is no tunneling or undermining noted. There is a large amount of serous drainage noted. The wound margin is flat and intact. There is large (67-100%) pink granulation within the wound bed. There is a small (1-33%) amount of necrotic tissue within the wound bed including Adherent Slough. The periwound skin appearance exhibited: Erythema. The periwound skin appearance did not exhibit: Callus, Crepitus, Excoriation, Induration, Rash, Scarring, Dry/Scaly, Maceration, Atrophie Blanche, Cyanosis, Ecchymosis, Hemosiderin Staining, Mottled, Pallor, Rubor. The surrounding wound skin color is noted with erythema which is circumferential. Periwound temperature was noted as No Abnormality. The periwound has tenderness on palpation. [name], [name] ([id number]) Assessment Active Problems ICD-10 E11.622 - Type 2 diabetes mellitus with other skin ulcer S81.811A - Laceration without foreign body, right lower leg, initial encounter L97.212 - Non-pressure chronic ulcer of  right calf with fat layer exposed E66.01 - Morbid (severe) obesity due to excess calories Plan Wound Cleansing: Wound #1 Right,Posterior Lower Leg: Clean wound with Normal Saline. May Shower, gently pat wound dry prior to applying new dressing. Anesthetic: Wound #1 Right,Posterior Lower Leg: Topical Lidocaine 4% cream applied to wound bed prior to debridement Primary Wound Dressing: Wound #1 Right,Posterior Lower Leg: Silvercel Non-Adherent Secondary Dressing: Wound #1 Right,Posterior Lower Leg: Boardered Foam Dressing - or telfa island dressing Dressing Change Frequency: Wound #1 Right,Posterior Lower Leg: Change dressing every other day. Follow-up Appointments: Wound #1 Right,Posterior Lower Leg: Return Appointment in 1 week. Edema Control: Wound #1 Right,Posterior Lower Leg: Elevate legs to the level of the heart and pump ankles as often as possible Additional Orders / Instructions: Wound #1 Right,Posterior Lower Leg: Increase protein intake. Other: - Try to keep blood sugars below 180 and please add vitamin A, vitamin C and zinc supplements to your diet I'm going to recommend that we switch to a silver alginate dressing today which I think will do very well in preventing the maceration and the surrounding wound bed. This was explained to patient as well. We will see were things stand in one weeks time. Please see above for specific wound care orders. We will see patient for re-evaluation in 1 week here in the clinic. If anything worsens or changes patient will contact our office for additional recommendations. [name], [name] ([id number]) Electronic Signature(s) Signed: 06/18/2017 5:27:57 PM By: Worthy Keeler PA-C Entered By: Worthy Keeler on 06/18/2017 10:55:43 [name], [name] ([id number]) -------------------------------------------------------------------------------- ROS/PFSH Details Patient Name: [name], [name]. Date of Service: 06/18/2017 9:15 AM Medical Record  Number: [id number] Patient Account Number: [id number] Date of Birth/Sex: Jun 12, 1951 (66 y.o. Male) Treating RN: Montey Hora Primary Care Provider: Alma Friendly Other Clinician: Referring Provider: Alma Friendly Treating Provider/Extender: Melburn Hake, Romone Shaff Weeks in Treatment: 7 Information Obtained From Patient Wound History Do you currently have one or more open woundso Yes How many open wounds do you currently haveo 1 Approximately how long have you had your woundso 3 weeks How have you been treating your wound(s) until nowo dry dressing Has your wound(s) ever healed and then re-openedo No Have you had any lab work done in the past montho No Have you tested positive for an antibiotic resistant organism (MRSA, VRE)o No Have you tested positive for osteomyelitis (bone infection)o No Have you had any  tests for circulation on your legso No Have you had other problems associated with your woundso Infection Constitutional Symptoms (General Health) Complaints and Symptoms: Negative for: Fever; Chills Cardiovascular Complaints and Symptoms: Positive for: LE edema Medical History: Positive for: Hypertension Respiratory Complaints and Symptoms: No Complaints or Symptoms Medical History: Past Medical History Notes: bronchitis Endocrine Medical History: Positive for: Type II Diabetes Time with diabetes: since 1997 Treated with: Insulin Blood sugar tested every day: Yes Tested : Musculoskeletal Medical History: Positive for: Osteoarthritis [name], [name] ([id number]) Psychiatric Complaints and Symptoms: No Complaints or Symptoms Immunizations Pneumococcal Vaccine: Received Pneumococcal Vaccination: Yes Immunization Notes: up to date Implantable Devices Family and Social History Cancer: No; Diabetes: Yes - Mother; Heart Disease: No; Hereditary Spherocytosis: No; Hypertension: No; Kidney Disease: No; Lung Disease: No; Seizures: No; Stroke: Yes - Mother,Father; Thyroid  Problems: No; Tuberculosis: No; Former smoker - 2+ years ago; Marital Status - Married; Alcohol Use: Never; Drug Use: No History; Caffeine Use: Daily; Financial Concerns: No; Food, Clothing or Shelter Needs: No; Support System Lacking: No; Transportation Concerns: No; Advanced Directives: No; Patient does not want information on Advanced Directives Physician Affirmation I have reviewed and agree with the above information. Electronic Signature(s) Signed: 06/18/2017 4:00:26 PM By: Montey Hora Signed: 06/18/2017 5:27:57 PM By: Worthy Keeler PA-C Entered By: Worthy Keeler on 06/18/2017 10:54:30 [name] ([id number]) -------------------------------------------------------------------------------- SuperBill Details Patient Name: [name], [name]. Date of Service: 06/18/2017 Medical Record Number: [id number] Patient Account Number: [id number] Date of Birth/Sex: 1951/01/12 (66 y.o. Male) Treating RN: Montey Hora Primary Care Provider: Alma Friendly Other Clinician: Referring Provider: Alma Friendly Treating Provider/Extender: Melburn Hake, Caliph Borowiak Weeks in Treatment: 7 Diagnosis Coding ICD-10 Codes Code Description E11.622 Type 2 diabetes mellitus with other skin ulcer S81.811A Laceration without foreign body, right lower leg, initial encounter L97.212 Non-pressure chronic ulcer of right calf with fat layer exposed E66.01 Morbid (severe) obesity due to excess calories Facility Procedures CPT4 Code: [id number] Description: 99213 - WOUND CARE VISIT-LEV 3 EST PT Modifier: Quantity: 1 Physician Procedures CPT4 Code: [id number] Description: 35465 - WC PHYS LEVEL 3 - EST PT ICD-10 Diagnosis Description E11.622 Type 2 diabetes mellitus with other skin ulcer S81.811A Laceration without foreign body, right lower leg, initial en L97.212 Non-pressure chronic ulcer of right calf with  fat layer expo E66.01 Morbid (severe) obesity due to excess calories Modifier: counter sed Quantity:  1 Electronic Signature(s) Signed: 06/18/2017 4:00:26 PM By: Montey Hora Signed: 06/18/2017 5:27:57 PM By: Worthy Keeler PA-C Entered By: Montey Hora on 06/18/2017 11:09:17

## 2017-06-19 NOTE — Progress Notes (Signed)
[name], [name] ([id number]) Visit Report for 06/18/2017 Arrival Information Details Patient Name: [name], [name]. Date of Service: 06/18/2017 9:15 AM Medical Record Number: [id number] Patient Account Number: [id number] Date of Birth/Sex: 1951-03-11 (66 y.o. Male) Treating RN: Montey Hora Primary Care Vada Swift: Alma Friendly Other Clinician: Referring Maurianna Benard: Alma Friendly Treating Akeila Lana/Extender: Melburn Hake, HOYT Weeks in Treatment: 7 Visit Information History Since Last Visit Added or deleted any medications: No Patient Arrived: Ambulatory Any new allergies or adverse reactions: No Arrival Time: 09:28 Had a fall or experienced change in No Accompanied By: self activities of daily living that may affect Transfer Assistance: None risk of falls: Patient Identification Verified: Yes Signs or symptoms of abuse/neglect since last visito No Secondary Verification Process Completed: Yes Hospitalized since last visit: No Patient Requires Transmission-Based No Has Dressing in Place as Prescribed: Yes Precautions: Pain Present Now: No Patient Has Alerts: Yes Patient Alerts: DMII Electronic Signature(s) Signed: 06/18/2017 4:00:26 PM By: Montey Hora Entered By: Montey Hora on 06/18/2017 09:31:30 [name] ([id number]) -------------------------------------------------------------------------------- Clinic Level of Care Assessment Details Patient Name: [name]. Date of Service: 06/18/2017 9:15 AM Medical Record Number: [id number] Patient Account Number: [id number] Date of Birth/Sex: Aug 13, 1950 (66 y.o. Male) Treating RN: Montey Hora Primary Care Laquincy Eastridge: Alma Friendly Other Clinician: Referring Staphany Ditton: Alma Friendly Treating Kaydyn Sayas/Extender: Melburn Hake, HOYT Weeks in Treatment: 7 Clinic Level of Care Assessment Items TOOL 4 Quantity Score []  - Use when only an EandM is performed on FOLLOW-UP visit 0 ASSESSMENTS - Nursing Assessment /  Reassessment X - Reassessment of Co-morbidities (includes updates in patient status) 1 10 X- 1 5 Reassessment of Adherence to Treatment Plan ASSESSMENTS - Wound and Skin Assessment / Reassessment X - Simple Wound Assessment / Reassessment - one wound 1 5 []  - 0 Complex Wound Assessment / Reassessment - multiple wounds []  - 0 Dermatologic / Skin Assessment (not related to wound area) ASSESSMENTS - Focused Assessment []  - Circumferential Edema Measurements - multi extremities 0 []  - 0 Nutritional Assessment / Counseling / Intervention X- 1 5 Lower Extremity Assessment (monofilament, tuning fork, pulses) []  - 0 Peripheral Arterial Disease Assessment (using hand held doppler) ASSESSMENTS - Ostomy and/or Continence Assessment and Care []  - Incontinence Assessment and Management 0 []  - 0 Ostomy Care Assessment and Management (repouching, etc.) PROCESS - Coordination of Care X - Simple Patient / Family Education for ongoing care 1 15 []  - 0 Complex (extensive) Patient / Family Education for ongoing care []  - 0 Staff obtains Programmer, systems, Records, Test Results / Process Orders []  - 0 Staff telephones HHA, Nursing Homes / Clarify orders / etc []  - 0 Routine Transfer to another Facility (non-emergent condition) []  - 0 Routine Hospital Admission (non-emergent condition) []  - 0 New Admissions / Biomedical engineer / Ordering NPWT, Apligraf, etc. []  - 0 Emergency Hospital Admission (emergent condition) X- 1 10 Simple Discharge Coordination [name], [name] ([id number]) []  - 0 Complex (extensive) Discharge Coordination PROCESS - Special Needs []  - Pediatric / Minor Patient Management 0 []  - 0 Isolation Patient Management []  - 0 Hearing / Language / Visual special needs []  - 0 Assessment of Community assistance (transportation, D/C planning, etc.) []  - 0 Additional assistance / Altered mentation []  - 0 Support Surface(s) Assessment (bed, cushion, seat, etc.) INTERVENTIONS -  Wound Cleansing / Measurement X - Simple Wound Cleansing - one wound 1 5 []  - 0 Complex Wound Cleansing - multiple wounds X- 1 5 Wound Imaging (photographs - any number of wounds) []  -  0 Wound Tracing (instead of photographs) X- 1 5 Simple Wound Measurement - one wound []  - 0 Complex Wound Measurement - multiple wounds INTERVENTIONS - Wound Dressings X - Small Wound Dressing one or multiple wounds 1 10 []  - 0 Medium Wound Dressing one or multiple wounds []  - 0 Large Wound Dressing one or multiple wounds []  - 0 Application of Medications - topical []  - 0 Application of Medications - injection INTERVENTIONS - Miscellaneous []  - External ear exam 0 []  - 0 Specimen Collection (cultures, biopsies, blood, body fluids, etc.) []  - 0 Specimen(s) / Culture(s) sent or taken to Lab for analysis []  - 0 Patient Transfer (multiple staff / Civil Service fast streamer / Similar devices) []  - 0 Simple Staple / Suture removal (25 or less) []  - 0 Complex Staple / Suture removal (26 or more) []  - 0 Hypo / Hyperglycemic Management (close monitor of Blood Glucose) []  - 0 Ankle / Brachial Index (ABI) - do not check if billed separately X- 1 5 Vital Signs [name], [name] ([id number]) Has the patient been seen at the hospital within the last three years: Yes Total Score: 80 Level Of Care: New/Established - Level 3 Electronic Signature(s) Signed: 06/18/2017 4:00:26 PM By: Montey Hora Entered By: Montey Hora on 06/18/2017 11:09:06 [name] ([id number]) -------------------------------------------------------------------------------- Encounter Discharge Information Details Patient Name: [name]. Date of Service: 06/18/2017 9:15 AM Medical Record Number: [id number] Patient Account Number: [id number] Date of Birth/Sex: 1950-09-26 (66 y.o. Male) Treating RN: Montey Hora Primary Care Khai Arrona: Alma Friendly Other Clinician: Referring Jaela Yepez: Alma Friendly Treating Michaelyn Wall/Extender:  Melburn Hake, HOYT Weeks in Treatment: 7 Encounter Discharge Information Items Discharge Pain Level: 0 Discharge Condition: Stable Ambulatory Status: Ambulatory Discharge Destination: Home Transportation: Private Auto Accompanied By: self Schedule Follow-up Appointment: Yes Medication Reconciliation completed and No provided to Patient/Care Samaya Boardley: Provided on Clinical Summary of Care: 06/18/2017 Form Type Recipient Paper Patient JB Electronic Signature(s) Signed: 06/18/2017 4:00:26 PM By: Montey Hora Entered By: Montey Hora on 06/18/2017 11:10:17 [name] ([id number]) -------------------------------------------------------------------------------- Lower Extremity Assessment Details Patient Name: [name]. Date of Service: 06/18/2017 9:15 AM Medical Record Number: [id number] Patient Account Number: [id number] Date of Birth/Sex: [birth year] (66 y.o. Male) Treating RN: Montey Hora Primary Care Fortino Haag: Alma Friendly Other Clinician: Referring Jaysiah Marchetta: Alma Friendly Treating Rosellen Lichtenberger/Extender: Melburn Hake, HOYT Weeks in Treatment: 7 Vascular Assessment Pulses: Dorsalis Pedis Palpable: [Right:Yes] Posterior Tibial Extremity colors, hair growth, and conditions: Extremity Color: [Right:Normal] Hair Growth on Extremity: [Right:No] Temperature of Extremity: [Right:Warm] Capillary Refill: [Right:< 3 seconds] Electronic Signature(s) Signed: 06/18/2017 4:00:26 PM By: Montey Hora Entered By: Montey Hora on 06/18/2017 09:38:36 [name] ([id number]) -------------------------------------------------------------------------------- Multi Wound Chart Details Patient Name: [name]. Date of Service: 06/18/2017 9:15 AM Medical Record Number: [id number] Patient Account Number: [id number] Date of Birth/Sex: 1950/10/27 (67 y.o. Male) Treating RN: Montey Hora Primary Care Shenouda Genova: Alma Friendly Other Clinician: Referring Dayona Shaheen: Alma Friendly Treating Jurni Cesaro/Extender: Melburn Hake, HOYT Weeks in Treatment: 7 Vital Signs Height(in): 70 Pulse(bpm): 74 Weight(lbs): 302 Blood Pressure(mmHg): 160/74 Body Mass Index(BMI): 43 Temperature(F): 97.9 Respiratory Rate 20 (breaths/min): Photos: [1:No Photos] [N/A:N/A] Wound Location: [1:Right Lower Leg - Posterior] [N/A:N/A] Wounding Event: [1:Trauma] [N/A:N/A] Primary Etiology: [1:Trauma, Other] [N/A:N/A] Comorbid History: [1:Hypertension, Type II Diabetes, Osteoarthritis] [N/A:N/A] Date Acquired: [1:04/06/2017] [N/A:N/A] Weeks of Treatment: [1:7] [N/A:N/A] Wound Status: [1:Open] [N/A:N/A] Measurements L x W x D [1:2.2x0.5x0.1] [N/A:N/A] (cm) Area (cm) : [1:0.864] [N/A:N/A] Volume (cm) : [1:0.086] [N/A:N/A] % Reduction in Area: [1:66.70%] [N/A:N/A] %  Reduction in Volume: [1:66.80%] [N/A:N/A] Classification: [1:Full Thickness Without Exposed Support Structures] [N/A:N/A] Exudate Amount: [1:Large] [N/A:N/A] Exudate Type: [1:Serous] [N/A:N/A] Exudate Color: [1:amber] [N/A:N/A] Wound Margin: [1:Flat and Intact] [N/A:N/A] Granulation Amount: [1:Large (67-100%)] [N/A:N/A] Granulation Quality: [1:Pink] [N/A:N/A] Necrotic Amount: [1:Small (1-33%)] [N/A:N/A] Exposed Structures: [1:Fat Layer (Subcutaneous Tissue) Exposed: Yes Fascia: No Tendon: No Muscle: No Joint: No Bone: No] [N/A:N/A] Epithelialization: [1:Small (1-33%)] [N/A:N/A] Periwound Skin Texture: [1:Excoriation: No Induration: No Callus: No Crepitus: No] [N/A:N/A] Rash: No Scarring: No Periwound Skin Moisture: Maceration: No N/A N/A Dry/Scaly: No Periwound Skin Color: Erythema: Yes N/A N/A Atrophie Blanche: No Cyanosis: No Ecchymosis: No Hemosiderin Staining: No Mottled: No Pallor: No Rubor: No Erythema Location: Circumferential N/A N/A Temperature: No Abnormality N/A N/A Tenderness on Palpation: Yes N/A N/A Wound Preparation: Ulcer Cleansing: N/A N/A Rinsed/Irrigated with Saline Topical  Anesthetic Applied: Other: lidocaine 4% Treatment Notes Electronic Signature(s) Signed: 06/18/2017 4:00:26 PM By: Montey Hora Entered By: Montey Hora on 06/18/2017 09:38:47 [name], [name] ([id number]) -------------------------------------------------------------------------------- [place] Details Patient Name: [name], [name]. Date of Service: 06/18/2017 9:15 AM Medical Record Number: [id number] Patient Account Number: [id number] Date of Birth/Sex: June 10, 1951 (66 y.o. Male) Treating RN: Montey Hora Primary Care Maybell Misenheimer: Alma Friendly Other Clinician: Referring Doretta Remmert: Alma Friendly Treating Taytum Scheck/Extender: Melburn Hake, HOYT Weeks in Treatment: 7 Active Inactive ` Nutrition Nursing Diagnoses: Impaired glucose control: actual or potential Goals: Patient/caregiver verbalizes understanding of need to maintain therapeutic glucose control per primary care physician Date Initiated: 04/27/2017 Target Resolution Date: 07/04/2017 Goal Status: Active Interventions: Assess HgA1c results as ordered upon admission and as needed Provide education on elevated blood sugars and impact on wound healing Notes: ` Orientation to the Wound Care Program Nursing Diagnoses: Knowledge deficit related to the wound healing center program Goals: Patient/caregiver will verbalize understanding of the [place] Date Initiated: 04/27/2017 Target Resolution Date: 07/20/2017 Goal Status: Active Interventions: Provide education on orientation to the wound center Notes: ` Wound/Skin Impairment Nursing Diagnoses: Impaired tissue integrity Goals: Ulcer/skin breakdown will heal within 14 weeks Date Initiated: 04/27/2017 Target Resolution Date: 07/20/2017 Goal Status: Active [name], [name] ([id number]) Interventions: Assess patient/caregiver ability to obtain necessary supplies Assess patient/caregiver ability to perform ulcer/skin care regimen upon  admission and as needed Assess ulceration(s) every visit Notes: Electronic Signature(s) Signed: 06/18/2017 4:00:26 PM By: Montey Hora Entered By: Montey Hora on 06/18/2017 09:38:40 [name] ([id number]) -------------------------------------------------------------------------------- Pain Assessment Details Patient Name: [name]. Date of Service: 06/18/2017 9:15 AM Medical Record Number: [id number] Patient Account Number: [id number] Date of Birth/Sex: Mar 21, 1951 (66 y.o. Male) Treating RN: Montey Hora Primary Care Sahian Kerney: Alma Friendly Other Clinician: Referring Nicandro Perrault: Alma Friendly Treating Gavinn Collard/Extender: Melburn Hake, HOYT Weeks in Treatment: 7 Active Problems Location of Pain Severity and Description of Pain Patient Has Paino No Site Locations Pain Management and Medication Current Pain Management: Notes Topical or injectable lidocaine is offered to patient for acute pain when surgical debridement is performed. If needed, Patient is instructed to use over the counter pain medication for the following 24-48 hours after debridement. Wound care MDs do not prescribed pain medications. Patient has chronic pain or uncontrolled pain. Patient has been instructed to make an appointment with their Primary Care Physician for pain management. Electronic Signature(s) Signed: 06/18/2017 4:00:26 PM By: Montey Hora Entered By: Montey Hora on 06/18/2017 09:31:39 [name] ([id number]) -------------------------------------------------------------------------------- Patient/Caregiver Education Details Patient Name: [name], [name]. Date of Service: 06/18/2017 9:15 AM Medical Record Number: [id number] Patient Account Number: [id number] Date of  Birth/Gender: 01/09/1951 (66 y.o. Male) Treating RN: Montey Hora Primary Care Physician: Alma Friendly Other Clinician: Referring Physician: Alma Friendly Treating Physician/Extender: Sharalyn Ink in  Treatment: 7 Education Assessment Education Provided To: Patient Education Topics Provided Wound/Skin Impairment: Handouts: Other: wound care as ordered Methods: Demonstration, Explain/Verbal Responses: State content correctly Electronic Signature(s) Signed: 06/18/2017 4:00:26 PM By: Montey Hora Entered By: Montey Hora on 06/18/2017 11:10:38 [name] ([id number]) -------------------------------------------------------------------------------- Wound Assessment Details Patient Name: [name]. Date of Service: 06/18/2017 9:15 AM Medical Record Number: [id number] Patient Account Number: [id number] Date of Birth/Sex: 03/08/1951 (66 y.o. Male) Treating RN: Montey Hora Primary Care Nakota Elsen: Alma Friendly Other Clinician: Referring Nishat Livingston: Alma Friendly Treating Ketina Mars/Extender: Melburn Hake, HOYT Weeks in Treatment: 7 Wound Status Wound Number: 1 Primary Etiology: Trauma, Other Wound Location: Right Lower Leg - Posterior Wound Status: Open Wounding Event: Trauma Comorbid Hypertension, Type II Diabetes, History: Osteoarthritis Date Acquired: 04/06/2017 Weeks Of Treatment: 7 Clustered Wound: No Photos Photo Uploaded By: Montey Hora on 06/18/2017 12:23:05 Wound Measurements Length: (cm) 2.2 Width: (cm) 0.5 Depth: (cm) 0.1 Area: (cm) 0.864 Volume: (cm) 0.086 % Reduction in Area: 66.7% % Reduction in Volume: 66.8% Epithelialization: Small (1-33%) Tunneling: No Undermining: No Wound Description Full Thickness Without Exposed Support Foul O Classification: Structures Slough Wound Margin: Flat and Intact Exudate Large Amount: Exudate Type: Serous Exudate Color: amber dor After Cleansing: No /Fibrino Yes Wound Bed Granulation Amount: Large (67-100%) Exposed Structure Granulation Quality: Pink Fascia Exposed: No Necrotic Amount: Small (1-33%) Fat Layer (Subcutaneous Tissue) Exposed: Yes Necrotic Quality: Adherent Slough Tendon Exposed:  No Muscle Exposed: No Joint Exposed: No Bone Exposed: No [name], [name]. ([id number]) Periwound Skin Texture Texture Color No Abnormalities Noted: No No Abnormalities Noted: No Callus: No Atrophie Blanche: No Crepitus: No Cyanosis: No Excoriation: No Ecchymosis: No Induration: No Erythema: Yes Rash: No Erythema Location: Circumferential Scarring: No Hemosiderin Staining: No Mottled: No Moisture Pallor: No No Abnormalities Noted: No Rubor: No Dry / Scaly: No Maceration: No Temperature / Pain Temperature: No Abnormality Tenderness on Palpation: Yes Wound Preparation Ulcer Cleansing: Rinsed/Irrigated with Saline Topical Anesthetic Applied: Other: lidocaine 4%, Electronic Signature(s) Signed: 06/18/2017 4:00:26 PM By: Montey Hora Entered By: Montey Hora on 06/18/2017 09:38:18 [name], [name] ([id number]) -------------------------------------------------------------------------------- Vitals Details Patient Name: [name]. Date of Service: 06/18/2017 9:15 AM Medical Record Number: [id number] Patient Account Number: [id number] Date of Birth/Sex: [birth year] (66 y.o. Male) Treating RN: Montey Hora Primary Care Jozette Castrellon: Alma Friendly Other Clinician: Referring Joscelynn Brutus: Alma Friendly Treating Yanni Quiroa/Extender: Melburn Hake, HOYT Weeks in Treatment: 7 Vital Signs Time Taken: 09:33 Temperature (F): 97.9 Height (in): 70 Pulse (bpm): 84 Weight (lbs): 302 Respiratory Rate (breaths/min): 20 Body Mass Index (BMI): 43.3 Blood Pressure (mmHg): 160/74 Reference Range: 80 - 120 mg / dl Electronic Signature(s) Signed: 06/18/2017 4:00:26 PM By: Montey Hora Entered By: Montey Hora on 06/18/2017 09:34:33

## 2017-06-25 ENCOUNTER — Ambulatory Visit: Payer: BLUE CROSS/BLUE SHIELD | Admitting: Surgery

## 2017-06-27 ENCOUNTER — Ambulatory Visit
Admission: RE | Admit: 2017-06-27 | Discharge: 2017-06-27 | Disposition: A | Discharge status: Home / Self Care | Payer: BLUE CROSS/BLUE SHIELD | Source: Ambulatory Visit | Attending: Oncology | Admitting: Oncology

## 2017-06-27 DIAGNOSIS — I700 Atherosclerosis of aorta: Secondary | ICD-10-CM | POA: Insufficient documentation

## 2017-06-27 DIAGNOSIS — Z122 Encounter for screening for malignant neoplasm of respiratory organs: Secondary | ICD-10-CM

## 2017-06-27 DIAGNOSIS — Z87891 Personal history of nicotine dependence: Secondary | ICD-10-CM | POA: Diagnosis not present

## 2017-06-27 DIAGNOSIS — J439 Emphysema, unspecified: Secondary | ICD-10-CM | POA: Insufficient documentation

## 2017-06-27 DIAGNOSIS — I2510 Atherosclerotic heart disease of native coronary artery without angina pectoris: Secondary | ICD-10-CM | POA: Diagnosis not present

## 2017-07-02 ENCOUNTER — Encounter: Payer: Self-pay | Admitting: *Deleted

## 2017-07-02 ENCOUNTER — Encounter: Payer: BLUE CROSS/BLUE SHIELD | Admitting: Surgery

## 2017-07-02 DIAGNOSIS — E11622 Type 2 diabetes mellitus with other skin ulcer: Secondary | ICD-10-CM | POA: Diagnosis not present

## 2017-07-03 NOTE — Progress Notes (Signed)
KYRELL, RUACHO ([id number]) Visit Report for 07/02/2017 Chief Complaint Document Details Patient Name: [name], [name]. Date of Service: 07/02/2017 9:15 AM Medical Record Number: [id number] Patient Account Number: [id number] Date of Birth/Sex: 06-28-1951 (66 y.o. Male) Treating RN: Montey Hora Primary Care Provider: Alma Friendly Other Clinician: Referring Provider: Alma Friendly Treating Provider/Extender: Frann Rider in Treatment: 9 Information Obtained from: Patient Chief Complaint Patient seen for complaints of Non-Healing Wound to the right calf which he sustained Electronic Signature(s) Signed: 07/02/2017 10:14:08 AM By: Christin Fudge MD, FACS Entered By: Christin Fudge on 07/02/2017 10:14:08 [name] ([id number]) -------------------------------------------------------------------------------- HPI Details Patient Name: [name]. Date of Service: 07/02/2017 9:15 AM Medical Record Number: [id number] Patient Account Number: [id number] Date of Birth/Sex: 19-May-1951 (66 y.o. Male) Treating RN: Montey Hora Primary Care Provider: Alma Friendly Other Clinician: Referring Provider: Alma Friendly Treating Provider/Extender: Frann Rider in Treatment: 9 History of Present Illness Location: right calf Quality: Patient reports experiencing a dull pain to affected area(s). Severity: Patient states wound are getting worse. Duration: Patient has had the wound for < 4 weeks prior to presenting for treatment Timing: Pain in wound is Intermittent (comes and goes Context: The wound occurred when the patient had a laceration with a tractor Modifying Factors: Other treatment(s) tried include:oral antibiotics and Bactroban ointment Associated Signs and Symptoms: Patient reports having increase discharge. HPI Description: 66 year old male with a history of diabetes mellitus type 2 has been seeing his PCP for lower extremity laceration and was initially seen  on September 20. 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 The nurse practitioner at the practice was concerned about her venous stasis ulcer and referred him to the wound center. He is a former smoker who quit in 2016 and has a past medical history of arthritis, bronchitis, colon polyps, diabetes mellitus and hypertension Last hemoglobin A1c about a year ago was 7.5%. He says he's had a more recent hemoglobin A1c done and it was about 7.2% 05/03/2017 -- he was unable to get Santyl through his insurance and hence he's been using Medihoney. 05/31/2017 -- due to the lack of insurance covering Santyl we have not been able to use this over the month and I have strongly recommended Santyl ointment again as he is not making adequate progress. We will do appeared to peer if required and given him all the possible coupons so that he is able to get hold of this. 06/11/2017 -- he was able to get Santyl ointment and has been using this. 06/18/17 on evaluation today patient appears to be doing well in regard to his right calf wound immediately. The wound does not appear to be significantly smaller today although he has noted some improvement and he tells me he is not having any discomfort. No fevers, chills, nausea, or vomiting noted at this time. He has been tolerating the dressing changes without complication. Electronic Signature(s) Signed: 07/02/2017 10:14:14 AM By: Christin Fudge MD, FACS Entered By: Christin Fudge on 07/02/2017 10:14:14 [name] ([id number]) -------------------------------------------------------------------------------- Physical Exam Details Patient Name: [name], [name]. Date of Service: 07/02/2017 9:15 AM Medical Record Number: [id number] Patient Account Number: [id number] Date of Birth/Sex: [birth year] (66 y.o. Male) Treating RN: Montey Hora Primary Care Provider: Alma Friendly  Other Clinician: Referring Provider: Alma Friendly Treating Provider/Extender: Frann Rider in Treatment: 9 Constitutional . Pulse regular. Respirations normal and unlabored. Afebrile. . Eyes Nonicteric. Reactive  to light. Ears, Nose, Mouth, and Throat Lips, teeth, and gums WNL.Marland Kitchen Moist mucosa without lesions. Neck supple and nontender. No palpable supraclavicular or cervical adenopathy. Normal sized without goiter. Respiratory WNL. No retractions.. Cardiovascular Pedal Pulses WNL. No clubbing, cyanosis or edema. Lymphatic No adneopathy. No adenopathy. No adenopathy. Musculoskeletal Adexa without tenderness or enlargement.. Digits and nails w/o clubbing, cyanosis, infection, petechiae, ischemia, or inflammatory conditions.. Integumentary (Hair, Skin) No suspicious lesions. No crepitus or fluctuance. No peri-wound warmth or erythema. No masses.Marland Kitchen Psychiatric Judgement and insight Intact.. No evidence of depression, anxiety, or agitation.. Notes the wound is looking excellent and most of it is covered with epithelium and the surrounding skin is looking good Electronic Signature(s) Signed: 07/02/2017 10:14:59 AM By: Christin Fudge MD, FACS Entered By: Christin Fudge on 07/02/2017 10:14:58 [name] ([id number]) -------------------------------------------------------------------------------- Physician Orders Details Patient Name: [name]. Date of Service: 07/02/2017 9:15 AM Medical Record Number: [id number] Patient Account Number: [id number] Date of Birth/Sex: 02-26-1951 (66 y.o. Male) Treating RN: Cornell Barman Primary Care Provider: Alma Friendly Other Clinician: Referring Provider: Alma Friendly Treating Provider/Extender: Frann Rider in Treatment: 9 Verbal / Phone Orders: No Diagnosis Coding Wound Cleansing Wound #1 Right,Posterior Lower Leg o Clean wound with Normal Saline. o May Shower, gently pat wound dry prior to applying new  dressing. Primary Wound Dressing Wound #1 Right,Posterior Lower Leg o Silvercel Non-Adherent Secondary Dressing Wound #1 Right,Posterior Lower Leg o Boardered Foam Dressing - or telfa island dressing Dressing Change Frequency Wound #1 Right,Posterior Lower Leg o Change dressing every other day. Follow-up Appointments o Return Appointment in 2 weeks. Edema Control Wound #1 Right,Posterior Lower Leg o Elevate legs to the level of the heart and pump ankles as often as possible Additional Orders / Instructions Wound #1 Right,Posterior Lower Leg o Increase protein intake. o Other: - Try to keep blood sugars below 180 and please add vitamin A, vitamin C and zinc supplements to your diet Electronic Signature(s) Signed: 07/02/2017 10:50:58 AM By: Christin Fudge MD, FACS Signed: 07/03/2017 11:38:18 AM By: Gretta Cool, BSN, RN, CWS, Kim RN, BSN Entered By: Gretta Cool, BSN, RN, CWS, Kim on 07/02/2017 10:01:04 [name], [name] ([id number]) -------------------------------------------------------------------------------- Problem List Details Patient Name: CHIOKE, NOXON. Date of Service: 07/02/2017 9:15 AM Medical Record Number: [id number] Patient Account Number: [id number] Date of Birth/Sex: 1951-02-25 (66 y.o. Male) Treating RN: Montey Hora Primary Care Provider: Alma Friendly Other Clinician: Referring Provider: Alma Friendly Treating Provider/Extender: Frann Rider in Treatment: 9 Active Problems ICD-10 Encounter Code Description Active Date Diagnosis E11.622 Type 2 diabetes mellitus with other skin ulcer 04/27/2017 Yes S81.811A Laceration without foreign body, right lower leg, initial encounter 04/27/2017 Yes L97.212 Non-pressure chronic ulcer of right calf with fat layer exposed 04/27/2017 Yes E66.01 Morbid (severe) obesity due to excess calories 04/27/2017 Yes Inactive Problems Resolved Problems Electronic Signature(s) Signed: 07/02/2017 10:11:55 AM By: Christin Fudge MD, FACS Entered By: Christin Fudge on 07/02/2017 10:11:54 [name] ([id number]) -------------------------------------------------------------------------------- Progress Note Details Patient Name: [name]. Date of Service: 07/02/2017 9:15 AM Medical Record Number: [id number] Patient Account Number: [id number] Date of Birth/Sex: 06-May-1951 (66 y.o. Male) Treating RN: Montey Hora Primary Care Provider: Alma Friendly Other Clinician: Referring Provider: Alma Friendly Treating Provider/Extender: Frann Rider in Treatment: 9 Subjective Chief Complaint Information obtained from Patient Patient seen for complaints of Non-Healing Wound to the right calf which he sustained History of Present Illness (HPI) The following HPI elements were documented for the patient's wound: Location: right calf Quality: Patient  reports experiencing a dull pain to affected area(s). Severity: Patient states wound are getting worse. Duration: Patient has had the wound for < 4 weeks prior to presenting for treatment Timing: Pain in wound is Intermittent (comes and goes Context: The wound occurred when the patient had a laceration with a tractor Modifying Factors: Other treatment(s) tried include:oral antibiotics and Bactroban ointment Associated Signs and Symptoms: Patient reports having increase discharge. 66 year old male with a history of diabetes mellitus type 2 has been seeing his PCP for lower extremity laceration and was initially seen on September 20. 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 The nurse practitioner at the practice was concerned about her venous stasis ulcer and referred him to the wound center. He is a former smoker who quit in 2016 and has a past medical history of arthritis, bronchitis, colon polyps, diabetes mellitus and hypertension Last hemoglobin A1c about a  year ago was 7.5%. He says he's had a more recent hemoglobin A1c done and it was about 7.2% 05/03/2017 -- he was unable to get Santyl through his insurance and hence he's been using Medihoney. 05/31/2017 -- due to the lack of insurance covering Santyl we have not been able to use this over the month and I have strongly recommended Santyl ointment again as he is not making adequate progress. We will do appeared to peer if required and given him all the possible coupons so that he is able to get hold of this. 06/11/2017 -- he was able to get Santyl ointment and has been using this. 06/18/17 on evaluation today patient appears to be doing well in regard to his right calf wound immediately. The wound does not appear to be significantly smaller today although he has noted some improvement and he tells me he is not having any discomfort. No fevers, chills, nausea, or vomiting noted at this time. He has been tolerating the dressing changes without complication. Patient History Information obtained from Patient. Family History Diabetes - Mother, Stroke - Mother,Father, No family history of Cancer, Heart Disease, Hereditary Spherocytosis, Hypertension, Kidney Disease, Lung Disease, Seizures, Thyroid Problems, Tuberculosis. Social History [name], [name] ([id number]) Former smoker - 2+ years ago, Marital Status - Married, Alcohol Use - Never, Drug Use - No History, Caffeine Use - Daily. Medical And Surgical History Notes Respiratory bronchitis Objective Constitutional Pulse regular. Respirations normal and unlabored. Afebrile. Vitals Time Taken: 9:29 AM, Height: 70 in, Weight: 302 lbs, BMI: 43.3, Temperature: 98.2 F, Pulse: 87 bpm, Respiratory Rate: 16 breaths/min, Blood Pressure: 150/64 mmHg. General Notes: Patient states he took his BP medication 1.5 hours ago. MD Notified of BP right arm 171/93. Will recheck in left arm. recheck 150/64. Eyes Nonicteric. Reactive to light. Ears, Nose, Mouth,  and Throat Lips, teeth, and gums WNL.Marland Kitchen Moist mucosa without lesions. Neck supple and nontender. No palpable supraclavicular or cervical adenopathy. Normal sized without goiter. Respiratory WNL. No retractions.. Cardiovascular Pedal Pulses WNL. No clubbing, cyanosis or edema. Lymphatic No adneopathy. No adenopathy. No adenopathy. Musculoskeletal Adexa without tenderness or enlargement.. Digits and nails w/o clubbing, cyanosis, infection, petechiae, ischemia, or inflammatory conditions.Marland Kitchen Psychiatric Judgement and insight Intact.. No evidence of depression, anxiety, or agitation.. General Notes: the wound is looking excellent and most of it is covered with epithelium and the surrounding skin is looking good Integumentary (Hair, Skin) No suspicious lesions. No crepitus or fluctuance. No peri-wound warmth or erythema. No masses.Marland Kitchen Juan Baumgarten  M. ([id number]) Wound #1 status is Open. Original cause of wound was Trauma. The wound is located on the Right,Posterior Lower Leg. The wound measures 0.5cm length x 0.5cm width x 0.1cm depth; 0.196cm^2 area and 0.02cm^3 volume. There is Fat Layer (Subcutaneous Tissue) Exposed exposed. There is no tunneling or undermining noted. There is a medium amount of serous drainage noted. The wound margin is flat and intact. There is large (67-100%) pink granulation within the wound bed. There is a small (1-33%) amount of necrotic tissue within the wound bed including Adherent Slough. The periwound skin appearance exhibited: Induration. The periwound skin appearance did not exhibit: Callus, Crepitus, Excoriation, Rash, Scarring, Dry/Scaly, Maceration, Atrophie Blanche, Cyanosis, Ecchymosis, Hemosiderin Staining, Mottled, Pallor, Rubor, Erythema. Periwound temperature was noted as No Abnormality. The periwound has tenderness on palpation. Assessment Active Problems ICD-10 E11.622 - Type 2 diabetes mellitus with other skin ulcer S81.811A - Laceration without  foreign body, right lower leg, initial encounter L97.212 - Non-pressure chronic ulcer of right calf with fat layer exposed E66.01 - Morbid (severe) obesity due to excess calories Plan Wound Cleansing: Wound #1 Right,Posterior Lower Leg: Clean wound with Normal Saline. May Shower, gently pat wound dry prior to applying new dressing. Primary Wound Dressing: Wound #1 Right,Posterior Lower Leg: Silvercel Non-Adherent Secondary Dressing: Wound #1 Right,Posterior Lower Leg: Boardered Foam Dressing - or telfa island dressing Dressing Change Frequency: Wound #1 Right,Posterior Lower Leg: Change dressing every other day. Follow-up Appointments: Return Appointment in 2 weeks. Edema Control: Wound #1 Right,Posterior Lower Leg: Elevate legs to the level of the heart and pump ankles as often as possible Additional Orders / Instructions: Wound #1 Right,Posterior Lower Leg: Increase protein intake. Other: - Try to keep blood sugars below 180 and please add vitamin A, vitamin C and zinc supplements to your diet [name], [name]. ([id number]) he has made excellent progress over the last couple of weeks and after review today I have recommended: 1. Silver alginate locally, to be applied daily after washing with soap and water, on alternate days 2. Good control of his diabetes mellitus 3. Adequate protein, vitamin A, vitamin C and zinc 4. Regular visits to the wound center -- we'll see him back in 2 weeks, due to the holidays and I anticipate discharge at that stage Electronic Signature(s) Signed: 07/02/2017 10:16:39 AM By: Christin Fudge MD, FACS Entered By: Christin Fudge on 07/02/2017 10:16:39 [name], [name] ([id number]) -------------------------------------------------------------------------------- ROS/PFSH Details Patient Name: [name]. Date of Service: 07/02/2017 9:15 AM Medical Record Number: [id number] Patient Account Number: [id number] Date of Birth/Sex: 07-06-1951 (66 y.o.  Male) Treating RN: Montey Hora Primary Care Provider: Alma Friendly Other Clinician: Referring Provider: Alma Friendly Treating Provider/Extender: Frann Rider in Treatment: 9 Information Obtained From Patient Wound History Do you currently have one or more open woundso Yes How many open wounds do you currently haveo 1 Approximately how long have you had your woundso 3 weeks How have you been treating your wound(s) until nowo dry dressing Has your wound(s) ever healed and then re-openedo No Have you had any lab work done in the past montho No Have you tested positive for an antibiotic resistant organism (MRSA, VRE)o No Have you tested positive for osteomyelitis (bone infection)o No Have you had any tests for circulation on your legso No Have you had other problems associated with your woundso Infection Respiratory Medical History: Past Medical History Notes: bronchitis Cardiovascular Medical History: Positive for: Hypertension Endocrine Medical History: Positive for: Type II Diabetes Time with diabetes:  since 1997 Treated with: Insulin Blood sugar tested every day: Yes Tested : Musculoskeletal Medical History: Positive for: Osteoarthritis Immunizations Pneumococcal Vaccine: Received Pneumococcal Vaccination: Yes Immunization Notes: up to date Implantable Devices Family and Social History [name], [name] ([id number]) Cancer: No; Diabetes: Yes - Mother; Heart Disease: No; Hereditary Spherocytosis: No; Hypertension: No; Kidney Disease: No; Lung Disease: No; Seizures: No; Stroke: Yes - Mother,Father; Thyroid Problems: No; Tuberculosis: No; Former smoker - 2+ years ago; Marital Status - Married; Alcohol Use: Never; Drug Use: No History; Caffeine Use: Daily; Financial Concerns: No; Food, Clothing or Shelter Needs: No; Support System Lacking: No; Transportation Concerns: No; Advanced Directives: No; Patient does not want information on Advanced  Directives Physician Affirmation I have reviewed and agree with the above information. Electronic Signature(s) Signed: 07/02/2017 10:50:58 AM By: Christin Fudge MD, FACS Signed: 07/02/2017 5:10:36 PM By: Montey Hora Entered By: Christin Fudge on 07/02/2017 10:14:23 [name] ([id number]) -------------------------------------------------------------------------------- SuperBill Details Patient Name: [name], [name]. Date of Service: 07/02/2017 Medical Record Number: [id number] Patient Account Number: [id number] Date of Birth/Sex: 1950-11-14 (66 y.o. Male) Treating RN: Montey Hora Primary Care Provider: Alma Friendly Other Clinician: Referring Provider: Alma Friendly Treating Provider/Extender: Frann Rider in Treatment: 9 Diagnosis Coding ICD-10 Codes Code Description E11.622 Type 2 diabetes mellitus with other skin ulcer S81.811A Laceration without foreign body, right lower leg, initial encounter L97.212 Non-pressure chronic ulcer of right calf with fat layer exposed E66.01 Morbid (severe) obesity due to excess calories Facility Procedures CPT4 Code: [id number] Description: [phone] - WOUND CARE VISIT-LEV 2 EST PT Modifier: Quantity: 1 Physician Procedures CPT4 Code: [id number] Description: 03013 - WC PHYS LEVEL 3 - EST PT ICD-10 Diagnosis Description E11.622 Type 2 diabetes mellitus with other skin ulcer S81.811A Laceration without foreign body, right lower leg, initial en L97.212 Non-pressure chronic ulcer of right calf with  fat layer expo E66.01 Morbid (severe) obesity due to excess calories Modifier: counter sed Quantity: 1 Electronic Signature(s) Signed: 07/02/2017 10:31:35 AM By: Christin Fudge MD, FACS Entered By: Christin Fudge on 07/02/2017 10:31:34

## 2017-07-03 NOTE — Progress Notes (Addendum)
BASCOM, BIEL ([id number]) Visit Report for 07/02/2017 Arrival Information Details Patient Name: [name], [name]. Date of Service: 07/02/2017 9:15 AM Medical Record Number: [id number] Patient Account Number: [id number] Date of Birth/Sex: 02/16/1951 (66 y.o. Male) Treating RN: Montey Hora Primary Care Laniqua Torrens: Alma Friendly Other Clinician: Referring Glenola Wheat: Alma Friendly Treating Christiann Hagerty/Extender: Frann Rider in Treatment: 9 Visit Information History Since Last Visit Added or deleted any medications: No Patient Arrived: Ambulatory Any new allergies or adverse reactions: No Arrival Time: 09:27 Had a fall or experienced change in No Accompanied By: self activities of daily living that may affect Transfer Assistance: None risk of falls: Patient Identification Verified: Yes Signs or symptoms of abuse/neglect since last visito No Secondary Verification Process Completed: Yes Hospitalized since last visit: No Patient Requires Transmission-Based No Has Dressing in Place as Prescribed: Yes Precautions: Pain Present Now: No Patient Has Alerts: Yes Patient Alerts: DMII Electronic Signature(s) Signed: 07/02/2017 5:10:36 PM By: Montey Hora Entered By: Montey Hora on 07/02/2017 09:28:10 [name] ([id number]) -------------------------------------------------------------------------------- Clinic Level of Care Assessment Details Patient Name: [name]. Date of Service: 07/02/2017 9:15 AM Medical Record Number: [id number] Patient Account Number: [id number] Date of Birth/Sex: May 10, 1951 (66 y.o. Male) Treating RN: Cornell Barman Primary Care Arianna Delsanto: Alma Friendly Other Clinician: Referring Trevyon Swor: Alma Friendly Treating Jeremiah Curci/Extender: Frann Rider in Treatment: 9 Clinic Level of Care Assessment Items TOOL 4 Quantity Score []  - Use when only an EandM is performed on FOLLOW-UP visit 0 ASSESSMENTS - Nursing Assessment / Reassessment []   - Reassessment of Co-morbidities (includes updates in patient status) 0 X- 1 5 Reassessment of Adherence to Treatment Plan ASSESSMENTS - Wound and Skin Assessment / Reassessment X - Simple Wound Assessment / Reassessment - one wound 1 5 []  - 0 Complex Wound Assessment / Reassessment - multiple wounds []  - 0 Dermatologic / Skin Assessment (not related to wound area) ASSESSMENTS - Focused Assessment []  - Circumferential Edema Measurements - multi extremities 0 []  - 0 Nutritional Assessment / Counseling / Intervention []  - 0 Lower Extremity Assessment (monofilament, tuning fork, pulses) []  - 0 Peripheral Arterial Disease Assessment (using hand held doppler) ASSESSMENTS - Ostomy and/or Continence Assessment and Care []  - Incontinence Assessment and Management 0 []  - 0 Ostomy Care Assessment and Management (repouching, etc.) PROCESS - Coordination of Care X - Simple Patient / Family Education for ongoing care 1 15 []  - 0 Complex (extensive) Patient / Family Education for ongoing care []  - 0 Staff obtains Programmer, systems, Records, Test Results / Process Orders []  - 0 Staff telephones HHA, Nursing Homes / Clarify orders / etc []  - 0 Routine Transfer to another Facility (non-emergent condition) []  - 0 Routine Hospital Admission (non-emergent condition) []  - 0 New Admissions / Biomedical engineer / Ordering NPWT, Apligraf, etc. []  - 0 Emergency Hospital Admission (emergent condition) X- 1 10 Simple Discharge Coordination JACARRI, GESNER ([id number]) []  - 0 Complex (extensive) Discharge Coordination PROCESS - Special Needs []  - Pediatric / Minor Patient Management 0 []  - 0 Isolation Patient Management []  - 0 Hearing / Language / Visual special needs []  - 0 Assessment of Community assistance (transportation, D/C planning, etc.) []  - 0 Additional assistance / Altered mentation []  - 0 Support Surface(s) Assessment (bed, cushion, seat, etc.) INTERVENTIONS - Wound Cleansing /  Measurement X - Simple Wound Cleansing - one wound 1 5 []  - 0 Complex Wound Cleansing - multiple wounds X- 1 5 Wound Imaging (photographs - any number of wounds) []  - 0 Wound  Tracing (instead of photographs) X- 1 5 Simple Wound Measurement - one wound []  - 0 Complex Wound Measurement - multiple wounds INTERVENTIONS - Wound Dressings X - Small Wound Dressing one or multiple wounds 1 10 []  - 0 Medium Wound Dressing one or multiple wounds []  - 0 Large Wound Dressing one or multiple wounds []  - 0 Application of Medications - topical []  - 0 Application of Medications - injection INTERVENTIONS - Miscellaneous []  - External ear exam 0 []  - 0 Specimen Collection (cultures, biopsies, blood, body fluids, etc.) []  - 0 Specimen(s) / Culture(s) sent or taken to Lab for analysis []  - 0 Patient Transfer (multiple staff / Civil Service fast streamer / Similar devices) []  - 0 Simple Staple / Suture removal (25 or less) []  - 0 Complex Staple / Suture removal (26 or more) []  - 0 Hypo / Hyperglycemic Management (close monitor of Blood Glucose) []  - 0 Ankle / Brachial Index (ABI) - do not check if billed separately X- 1 5 Vital Signs [name], [name] ([id number]) Has the patient been seen at the hospital within the last three years: Yes Total Score: 65 Level Of Care: New/Established - Level 2 Electronic Signature(s) Signed: 07/03/2017 11:38:18 AM By: Gretta Cool, BSN, RN, CWS, Kim RN, BSN Entered By: Gretta Cool, BSN, RN, CWS, Kim on 07/02/2017 10:01:38 [name] ([id number]) -------------------------------------------------------------------------------- Encounter Discharge Information Details Patient Name: [name], [name]. Date of Service: 07/02/2017 9:15 AM Medical Record Number: [id number] Patient Account Number: [id number] Date of Birth/Sex: 13-Oct-1950 (66 y.o. Male) Treating RN: Montey Hora Primary Care Dandria Griego: Alma Friendly Other Clinician: Referring Seann Genther: Alma Friendly Treating  Maura Braaten/Extender: Frann Rider in Treatment: 9 Encounter Discharge Information Items Discharge Pain Level: 0 Discharge Condition: Stable Ambulatory Status: Ambulatory Emergency Discharge Destination: Room Transportation: Other Accompanied By: self Schedule Follow-up Appointment: Yes Medication Reconciliation completed and Yes provided to Patient/Care Dorri Ozturk: Provided on Clinical Summary of Care: 07/02/2017 Form Type Recipient Paper Patient JB Electronic Signature(s) Signed: 07/02/2017 10:25:17 AM By: Gretta Cool, BSN, RN, CWS, Kim RN, BSN Entered By: Gretta Cool, BSN, RN, CWS, Kim on 07/02/2017 10:25:16 [name] ([id number]) -------------------------------------------------------------------------------- Lower Extremity Assessment Details Patient Name: [name], [name]. Date of Service: 07/02/2017 9:15 AM Medical Record Number: [id number] Patient Account Number: [id number] Date of Birth/Sex: [birth year] (66 y.o. Male) Treating RN: Cornell Barman Primary Care Oshay Stranahan: Alma Friendly Other Clinician: Referring Esparanza Krider: Alma Friendly Treating Sharlena Kristensen/Extender: Frann Rider in Treatment: 9 Vascular Assessment Pulses: Dorsalis Pedis Palpable: [Right:Yes] Posterior Tibial Extremity colors, hair growth, and conditions: Extremity Color: [Right:Normal] Hair Growth on Extremity: [Right:Yes] Temperature of Extremity: [Right:Warm] Capillary Refill: [Right:> 3 seconds] Toe Nail Assessment Left: Right: Thick: No Discolored: No Deformed: No Improper Length and Hygiene: No Electronic Signature(s) Signed: 07/03/2017 11:38:18 AM By: Gretta Cool, BSN, RN, CWS, Kim RN, BSN Entered By: Gretta Cool, BSN, RN, CWS, Kim on 07/02/2017 09:36:14 [name] ([id number]) -------------------------------------------------------------------------------- Multi Wound Chart Details Patient Name: [name]. Date of Service: 07/02/2017 9:15 AM Medical Record Number: [id number] Patient  Account Number: [id number] Date of Birth/Sex: [birth year] (66 y.o. Male) Treating RN: Cornell Barman Primary Care Burlene Montecalvo: Alma Friendly Other Clinician: Referring Paticia Moster: Alma Friendly Treating Nihaal Friesen/Extender: Frann Rider in Treatment: 9 Vital Signs Height(in): 70 Pulse(bpm): 87 Weight(lbs): 302 Blood Pressure(mmHg): 150/64 Body Mass Index(BMI): 43 Temperature(F): 98.2 Respiratory Rate 16 (breaths/min): Photos: [1:No Photos] [N/A:N/A] Wound Location: [1:Right Lower Leg - Posterior] [N/A:N/A] Wounding Event: [1:Trauma] [N/A:N/A] Primary Etiology: [1:Diabetic Wound/Ulcer of the Lower Extremity] [N/A:N/A] Secondary Etiology: [1:Trauma, Other] [N/A:N/A] Comorbid History: [1:Hypertension,  Type II Diabetes, Osteoarthritis] [N/A:N/A] Date Acquired: [1:04/06/2017] [N/A:N/A] Weeks of Treatment: [1:9] [N/A:N/A] Wound Status: [1:Open] [N/A:N/A] Measurements L x W x D [1:0.5x0.5x0.1] [N/A:N/A] (cm) Area (cm) : [1:0.196] [N/A:N/A] Volume (cm) : [1:0.02] [N/A:N/A] % Reduction in Area: [1:92.40%] [N/A:N/A] % Reduction in Volume: [1:92.30%] [N/A:N/A] Classification: [1:Grade 1] [N/A:N/A] Exudate Amount: [1:Medium] [N/A:N/A] Exudate Type: [1:Serous] [N/A:N/A] Exudate Color: [1:amber] [N/A:N/A] Wound Margin: [1:Flat and Intact] [N/A:N/A] Granulation Amount: [1:Large (67-100%)] [N/A:N/A] Granulation Quality: [1:Pink] [N/A:N/A] Necrotic Amount: [1:Small (1-33%)] [N/A:N/A] Exposed Structures: [1:Fat Layer (Subcutaneous Tissue) Exposed: Yes Fascia: No Tendon: No Muscle: No Joint: No Bone: No] [N/A:N/A] Epithelialization: [1:Medium (34-66%)] [N/A:N/A] Periwound Skin Texture: [1:Induration: Yes Excoriation: No Callus: No Crepitus: No] [N/A:N/A] Rash: No Scarring: No Periwound Skin Moisture: Maceration: No N/A N/A Dry/Scaly: No Periwound Skin Color: Atrophie Blanche: No N/A N/A Cyanosis: No Ecchymosis: No Erythema: No Hemosiderin Staining: No Mottled: No Pallor:  No Rubor: No Temperature: No Abnormality N/A N/A Tenderness on Palpation: Yes N/A N/A Wound Preparation: Ulcer Cleansing: N/A N/A Rinsed/Irrigated with Saline Topical Anesthetic Applied: Other: lidocaine 4% Treatment Notes Electronic Signature(s) Signed: 07/02/2017 10:13:55 AM By: Christin Fudge MD, FACS Entered By: Christin Fudge on 07/02/2017 10:13:54 [name] ([id number]) -------------------------------------------------------------------------------- [place] Details Patient Name: [name], [name]. Date of Service: 07/02/2017 9:15 AM Medical Record Number: [id number] Patient Account Number: [id number] Date of Birth/Sex: [birth year] (66 y.o. Male) Treating RN: Cornell Barman Primary Care Lutisha Knoche: Alma Friendly Other Clinician: Referring Kataya Guimont: Alma Friendly Treating Laquiesha Piacente/Extender: Frann Rider in Treatment: 9 Active Inactive Electronic Signature(s) Signed: 07/23/2017 11:47:14 AM By: Gretta Cool, BSN, RN, CWS, Kim RN, BSN Previous Signature: 07/03/2017 11:38:18 AM Version By: Gretta Cool, BSN, RN, CWS, Kim RN, BSN Entered By: Gretta Cool, BSN, RN, CWS, Kim on 07/23/2017 11:47:13 [name], [name] ([id number]) -------------------------------------------------------------------------------- Pain Assessment Details Patient Name: [name], [name]. Date of Service: 07/02/2017 9:15 AM Medical Record Number: [id number] Patient Account Number: [id number] Date of Birth/Sex: [birth year] (66 y.o. Male) Treating RN: Montey Hora Primary Care Sekai Gitlin: Alma Friendly Other Clinician: Referring Camilo Mander: Alma Friendly Treating Brittanny Levenhagen/Extender: Frann Rider in Treatment: 9 Active Problems Location of Pain Severity and Description of Pain Patient Has Paino No Site Locations Pain Management and Medication Current Pain Management: Notes Topical or injectable lidocaine is offered to patient for acute pain when surgical debridement is performed. If needed, Patient  is instructed to use over the counter pain medication for the following 24-48 hours after debridement. Wound care MDs do not prescribed pain medications. Patient has chronic pain or uncontrolled pain. Patient has been instructed to make an appointment with their Primary Care Physician for pain management. Electronic Signature(s) Signed: 07/02/2017 5:10:36 PM By: Montey Hora Entered By: Montey Hora on 07/02/2017 09:28:54 [name] ([id number]) -------------------------------------------------------------------------------- Patient/Caregiver Education Details Patient Name: [name], [name]. Date of Service: 07/02/2017 9:15 AM Medical Record Number: [id number] Patient Account Number: [id number] Date of Birth/Gender: 1951-01-25 (66 y.o. Male) Treating RN: Cornell Barman Primary Care Physician: Alma Friendly Other Clinician: Referring Physician: Alma Friendly Treating Physician/Extender: Frann Rider in Treatment: 9 Education Assessment Education Provided To: Patient Education Topics Provided Elevated Blood Sugar/ Impact on Healing: Handouts: Elevated Blood Sugars: How Do They Affect Wound Healing Methods: Explain/Verbal Responses: State content correctly Wound/Skin Impairment: Handouts: Caring for Your Ulcer Methods: Demonstration, Explain/Verbal Responses: State content correctly Electronic Signature(s) Signed: 07/03/2017 11:38:18 AM By: Gretta Cool, BSN, RN, CWS, Kim RN, BSN Entered By: Gretta Cool, BSN, RN, CWS, Kim on 07/02/2017 10:27:17 [name], [name] ([id number]) -------------------------------------------------------------------------------- Wound Assessment Details Patient  Name: [name], [name]. Date of Service: 07/02/2017 9:15 AM Medical Record Number: [id number] Patient Account Number: [id number] Date of Birth/Sex: 03/23/1951 (66 y.o. Male) Treating RN: Cornell Barman Primary Care Kniyah Khun: Alma Friendly Other Clinician: Referring Bon Dowis: Alma Friendly Treating  Jackelyn Illingworth/Extender: Frann Rider in Treatment: 9 Wound Status Wound Number: 1 Primary Etiology: Diabetic Wound/Ulcer of the Lower Extremity Wound Location: Right Lower Leg - Posterior Secondary Trauma, Other Wounding Event: Trauma Etiology: Date Acquired: 04/06/2017 Wound Status: Open Weeks Of Treatment: 9 Comorbid History: Hypertension, Type II Diabetes, Clustered Wound: No Osteoarthritis Photos Photo Uploaded By: Montey Hora on 07/02/2017 17:06:21 Wound Measurements Length: (cm) 0.5 Width: (cm) 0.5 Depth: (cm) 0.1 Area: (cm) 0.196 Volume: (cm) 0.02 % Reduction in Area: 92.4% % Reduction in Volume: 92.3% Epithelialization: Medium (34-66%) Tunneling: No Undermining: No Wound Description Classification: Grade 1 Wound Margin: Flat and Intact Exudate Amount: Medium Exudate Type: Serous Exudate Color: amber Foul Odor After Cleansing: No Slough/Fibrino Yes Wound Bed Granulation Amount: Large (67-100%) Exposed Structure Granulation Quality: Pink Fascia Exposed: No Necrotic Amount: Small (1-33%) Fat Layer (Subcutaneous Tissue) Exposed: Yes Necrotic Quality: Adherent Slough Tendon Exposed: No Muscle Exposed: No Joint Exposed: No Bone Exposed: No Periwound Skin Texture [name], [name]. ([id number]) Texture Color No Abnormalities Noted: No No Abnormalities Noted: No Callus: No Atrophie Blanche: No Crepitus: No Cyanosis: No Excoriation: No Ecchymosis: No Induration: Yes Erythema: No Rash: No Hemosiderin Staining: No Scarring: No Mottled: No Pallor: No Moisture Rubor: No No Abnormalities Noted: No Dry / Scaly: No Temperature / Pain Maceration: No Temperature: No Abnormality Tenderness on Palpation: Yes Wound Preparation Ulcer Cleansing: Rinsed/Irrigated with Saline Topical Anesthetic Applied: Other: lidocaine 4%, Electronic Signature(s) Signed: 07/03/2017 11:38:18 AM By: Gretta Cool, BSN, RN, CWS, Kim RN, BSN Entered By: Gretta Cool, BSN, RN, CWS, Kim  on 07/02/2017 09:34:59 [name], CERVINI ([id number]) -------------------------------------------------------------------------------- Vitals Details Patient Name: DONOVIN, KRAEMER. Date of Service: 07/02/2017 9:15 AM Medical Record Number: [id number] Patient Account Number: [id number] Date of Birth/Sex: 08/11/1950 (66 y.o. Male) Treating RN: Cornell Barman Primary Care Dmitry Macomber: Alma Friendly Other Clinician: Referring Becca Bayne: Alma Friendly Treating Tige Meas/Extender: Frann Rider in Treatment: 9 Vital Signs Time Taken: 09:29 Temperature (F): 98.2 Height (in): 70 Pulse (bpm): 87 Weight (lbs): 302 Respiratory Rate (breaths/min): 16 Body Mass Index (BMI): 43.3 Blood Pressure (mmHg): 150/64 Reference Range: 80 - 120 mg / dl Notes Patient states he took his BP medication 1.5 hours ago. MD Notified of BP right arm 171/93. Will recheck in left arm. recheck 150/64. Electronic Signature(s) Signed: 07/03/2017 11:38:18 AM By: Gretta Cool, BSN, RN, CWS, Kim RN, BSN Entered By: Gretta Cool, BSN, RN, CWS, Kim on 07/02/2017 09:32:52

## 2017-07-16 ENCOUNTER — Ambulatory Visit: Payer: BLUE CROSS/BLUE SHIELD | Admitting: Physician Assistant

## 2017-07-26 ENCOUNTER — Ambulatory Visit: Payer: BLUE CROSS/BLUE SHIELD | Admitting: Podiatry

## 2017-07-30 ENCOUNTER — Encounter: Payer: Self-pay | Admitting: Podiatry

## 2017-07-30 ENCOUNTER — Ambulatory Visit: Payer: BLUE CROSS/BLUE SHIELD | Admitting: Podiatry

## 2017-07-30 DIAGNOSIS — B351 Tinea unguium: Secondary | ICD-10-CM | POA: Diagnosis not present

## 2017-07-30 DIAGNOSIS — Q828 Other specified congenital malformations of skin: Secondary | ICD-10-CM

## 2017-07-30 DIAGNOSIS — E1149 Type 2 diabetes mellitus with other diabetic neurological complication: Secondary | ICD-10-CM

## 2017-07-30 NOTE — Progress Notes (Signed)
Complaint:  Visit Type: Patient returns to my office for continued preventative foot care services. Complaint: Patient states" my nails have grown long and thick and become painful to walk and wear shoes" Patient has been diagnosed with DM with no foot complications. The patient presents for preventative foot care services. No changes to ROS.  Painful callus right foot.  Podiatric Exam: Vascular: dorsalis pedis and posterior tibial pulses are palpable bilateral. Capillary return is immediate. Temperature gradient is WNL. Skin turgor WNL  Sensorium: Diminished  Semmes Weinstein monofilament test. Normal tactile sensation bilaterally. Nail Exam: Pt has thick disfigured discolored nails with subungual debris noted bilateral entire nail hallux through fifth toenails Ulcer Exam: There is no evidence of ulcer or pre-ulcerative changes or infection. Orthopedic Exam: Muscle tone and strength are WNL. No limitations in general ROM. No crepitus or effusions noted. Hallux malleus and hammer toes  B/L. Skin: Porokeratosis sub3,5 right foot. No infection or ulcers  Diagnosis:  Onychomycosis, , Pain in right toe, pain in left toes, Porokeratosis right foot  Treatment & Plan Procedures and Treatment: Consent by patient was obtained for treatment procedures. The patient understood the discussion of treatment and procedures well. All questions were answered thoroughly reviewed. Debridement of mycotic and hypertrophic toenails, 1 through 5 bilateral and clearing of subungual debris. No ulceration, no infection noted. Debride porokeratosis right foot using # 15 Blade. Return Visit-Office Procedure: Patient instructed to return to the office for a follow up visit 3 months for continued evaluation and treatment.    Gardiner Barefoot DPM

## 2017-08-05 ENCOUNTER — Other Ambulatory Visit: Payer: Self-pay | Admitting: Primary Care

## 2017-08-05 DIAGNOSIS — I10 Essential (primary) hypertension: Secondary | ICD-10-CM

## 2017-08-21 ENCOUNTER — Other Ambulatory Visit: Payer: Self-pay | Admitting: Primary Care

## 2017-08-21 DIAGNOSIS — I10 Essential (primary) hypertension: Secondary | ICD-10-CM

## 2017-08-21 MED ORDER — HYDROCHLOROTHIAZIDE 50 MG PO TABS: 50.0000 mg | ORAL_TABLET | Freq: Every day | ORAL | 1 refills | Status: DC

## 2017-08-23 ENCOUNTER — Other Ambulatory Visit: Payer: Self-pay

## 2017-08-23 DIAGNOSIS — E7800 Pure hypercholesterolemia, unspecified: Secondary | ICD-10-CM

## 2017-08-23 MED ORDER — FENOFIBRATE 145 MG PO TABS: 145.0000 mg | ORAL_TABLET | Freq: Every day | ORAL | 0 refills | Status: DC

## 2017-08-23 NOTE — Telephone Encounter (Signed)
[name] at [facility] left v/m requesting refill fenofibrate as transfer from [facility]. Pt last annual 06/05/16 and no future appt scheduled.Please advise.

## 2017-08-23 NOTE — Telephone Encounter (Signed)
Will send 30 day supply to [facility], patient office visit follow up with fasting labs 1-2 days prior. Please schedule.

## 2017-08-24 NOTE — Telephone Encounter (Signed)
Message left for patient to return my call.  Also sent patient a message through MyChart 

## 2017-08-29 ENCOUNTER — Telehealth: Payer: Self-pay

## 2017-08-29 NOTE — Telephone Encounter (Signed)
Spoken to patient and schedule physical on 09/25/2017

## 2017-08-29 NOTE — Telephone Encounter (Signed)
Looks like he is due for either a general follow-up visit or can do complete physical.  Please schedule. Have him come fasting to his appointment, we will obtain same day labs. Thanks.

## 2017-08-29 NOTE — Telephone Encounter (Signed)
Copied from [place]. Topic: Appointment Scheduling - Scheduling Inquiry for Clinic >> Aug 29, 2017 11:46 AM Conception Chancy, NT wrote: Please contact patient in regards to scheduling labs and follow up appt per my chart message.

## 2017-09-14 DIAGNOSIS — E1142 Type 2 diabetes mellitus with diabetic polyneuropathy: Secondary | ICD-10-CM | POA: Diagnosis not present

## 2017-09-14 DIAGNOSIS — E782 Mixed hyperlipidemia: Secondary | ICD-10-CM | POA: Diagnosis not present

## 2017-09-18 ENCOUNTER — Encounter: Payer: Self-pay | Admitting: General Surgery

## 2017-09-18 ENCOUNTER — Ambulatory Visit (INDEPENDENT_AMBULATORY_CARE_PROVIDER_SITE_OTHER): Payer: PPO | Admitting: General Surgery

## 2017-09-18 VITALS — BP 132/78 | HR 70 | Resp 16 | Ht 70.0 in | Wt 302.0 lb

## 2017-09-18 DIAGNOSIS — Z860100 Personal history of colon polyps, unspecified: Secondary | ICD-10-CM

## 2017-09-18 MED ORDER — POLYETHYLENE GLYCOL 3350 17 GM/SCOOP PO POWD: 1.0000 | Freq: Once | ORAL | 0 refills | Status: AC

## 2017-09-18 NOTE — Patient Instructions (Addendum)
Colonoscopy, Adult A colonoscopy is an exam to look at the entire large intestine. During the exam, a lubricated, bendable tube is inserted into the anus and then passed into the rectum, colon, and other parts of the large intestine. A colonoscopy is often done as a part of normal colorectal screening or in response to certain symptoms, such as anemia, persistent diarrhea, abdominal pain, and blood in the stool. The exam can help screen for and diagnose medical problems, including:  Tumors.  Polyps.  Inflammation.  Areas of bleeding.  Tell a health care provider about:  Any allergies you have.  All medicines you are taking, including vitamins, herbs, eye drops, creams, and over-the-counter medicines.  Any problems you or family members have had with anesthetic medicines.  Any blood disorders you have.  Any surgeries you have had.  Any medical conditions you have.  Any problems you have had passing stool. What are the risks? Generally, this is a safe procedure. However, problems may occur, including:  Bleeding.  A tear in the intestine.  A reaction to medicines given during the exam.  Infection (rare).  What happens before the procedure? Eating and drinking restrictions Follow instructions from your health care provider about eating and drinking, which may include:  A few days before the procedure - follow a low-fiber diet. Avoid nuts, seeds, dried fruit, raw fruits, and vegetables.  1-3 days before the procedure - follow a clear liquid diet. Drink only clear liquids, such as clear broth or bouillon, black coffee or tea, clear juice, clear soft drinks or sports drinks, gelatin dessert, and popsicles. Avoid any liquids that contain red or purple dye.  On the day of the procedure - do not eat or drink anything during the 2 hours before the procedure, or within the time period that your health care provider recommends.  Bowel prep If you were prescribed an oral bowel prep  to clean out your colon:  Take it as told by your health care provider. Starting the day before your procedure, you will need to drink a large amount of medicated liquid. The liquid will cause you to have multiple loose stools until your stool is almost clear or light green.  If your skin or anus gets irritated from diarrhea, you may use these to relieve the irritation: ? Medicated wipes, such as adult wet wipes with aloe and vitamin E. ? A skin soothing-product like petroleum jelly.  If you vomit while drinking the bowel prep, take a break for up to 60 minutes and then begin the bowel prep again. If vomiting continues and you cannot take the bowel prep without vomiting, call your health care provider.  General instructions  Ask your health care provider about changing or stopping your regular medicines. This is especially important if you are taking diabetes medicines or blood thinners.  Plan to have someone take you home from the hospital or clinic. What happens during the procedure?  An IV tube may be inserted into one of your veins.  You will be given medicine to help you relax (sedative).  To reduce your risk of infection: ? Your health care team will wash or sanitize their hands. ? Your anal area will be washed with soap.  You will be asked to lie on your side with your knees bent.  Your health care provider will lubricate a long, thin, flexible tube. The tube will have a camera and a light on the end.  The tube will be inserted into your   anus.  The tube will be gently eased through your rectum and colon.  Air will be delivered into your colon to keep it open. You may feel some pressure or cramping.  The camera will be used to take images during the procedure.  A small tissue sample may be removed from your body to be examined under a microscope (biopsy). If any potential problems are found, the tissue will be sent to a lab for testing.  If small polyps are found, your  health care provider may remove them and have them checked for cancer cells.  The tube that was inserted into your anus will be slowly removed. The procedure may vary among health care providers and hospitals. What happens after the procedure?  Your blood pressure, heart rate, breathing rate, and blood oxygen level will be monitored until the medicines you were given have worn off.  Do not drive for 24 hours after the exam.  You may have a small amount of blood in your stool.  You may pass gas and have mild abdominal cramping or bloating due to the air that was used to inflate your colon during the exam.  It is up to you to get the results of your procedure. Ask your health care provider, or the department performing the procedure, when your results will be ready. This information is not intended to replace advice given to you by your health care provider. Make sure you discuss any questions you have with your health care provider. Document Released: 06/30/2000 Document Revised: 05/03/2016 Document Reviewed: 09/14/2015 Elsevier Interactive Patient Education  [employer].  The patient is scheduled for a Colonoscopy at [facility] on 10/03/17. They are aware to call the day before to get their arrival time. He will decrease his Lantus to 40 units the day prior. He will take 1/2 of his does of Novolog the day prior. he may stay on his 81 mg Aspirin. Miralax prescription has been sent into the patient's pharmacy. The patient is aware of date and instructions.

## 2017-09-18 NOTE — Progress Notes (Signed)
Patient ID: [name], male   DOB: February 25, 1951, 67 y.o.   MRN: [id number]  Chief Complaint  Patient presents with  . Colonoscopy    HPI [name] is a 67 y.o. male here today for a evaluation of a colonoscopy. Last colonoscopy was done on 09/09/2014. Patient states he moves his bowels daily. No GI problems at this time.  Wife , [name] is present at visit.  HPI  Past Medical History:  Diagnosis Date  . Arthritis    knee  . Bronchitis   . Colon polyp 2008  . Diabetes mellitus   . Hypertension     Past Surgical History:  Procedure Laterality Date  . COLONOSCOPY  2008, 2016   Dr Bary Castilla  . DERMATOFIBROMA  03/26/15   Procedure: Excision mass left leg;  Surgeon: Robert Bellow, MD;  Location: [facility] ORS;  Service: General;  Laterality: N/A;  . LIPOMA EXCISION N/A 03/26/2015      . TONSILLECTOMY      Family History  Problem Relation Age of Onset  . Stroke Mother   . Diabetes Mother   . Stroke Father   . Diabetes Other   . Other Daughter 25       precancerous colon polyp   . Diabetes Maternal Grandmother   . Diabetes Paternal Grandmother     Social History Social History   Tobacco Use  . Smoking status: Former Smoker    Packs/day: 2.00    Years: 35.00    Pack years: 70.00    Types: Cigarettes, E-cigarettes    Last attempt to quit: 12/11/2014    Years since quitting: 2.7  . Smokeless tobacco: Never Used  Substance Use Topics  . Alcohol use: No    Alcohol/week: 0.0 oz  . Drug use: No    Allergies  Allergen Reactions  . Atorvastatin     Other reaction(s): Headache  . Other Other (See Comments)    STATIN DRUG-CAUSED MUSCLE PAIN/LETHARGIC    Current Outpatient Medications  Medication Sig Dispense Refill  . albuterol (PROVENTIL HFA;VENTOLIN HFA) 108 (90 Base) MCG/ACT inhaler Inhale 2 puffs into the lungs every 4 (four) hours as needed for wheezing or shortness of breath. 1 Inhaler 0  . aspirin EC 81 MG tablet Take 81 mg by mouth daily.    . fluticasone  (FLONASE) 50 MCG/ACT nasal spray Place 1 spray into both nostrils 2 (two) times daily as needed for allergies or rhinitis. 16 g 0  . hydrochlorothiazide (HYDRODIURIL) 50 MG tablet Take 1 tablet (50 mg total) by mouth daily. 90 tablet 1  . Insulin Glargine (LANTUS SOLOSTAR) 100 UNIT/ML Solostar Pen Inject 10 Units into the skin daily at 10 pm. Increase by 2 units daily until fasting is 130 in am. (Patient taking differently: Inject 58 units at bed time.) 5 pen PRN  . levothyroxine (SYNTHROID, LEVOTHROID) 25 MCG tablet TAKE 1 TABLET BY MOUTH EVERY MORNING 30 MINUTES BEFORE BREAKFAST. 30 tablet 5  . metFORMIN (GLUCOPHAGE) 1000 MG tablet TAKE 1 TABLET BY MOUTH TWICE A DAY 180 tablet 1  . NOVOLOG FLEXPEN 100 UNIT/ML FlexPen Inject 20 units before breakfast and lunch, inject 22 units before supper. Increase when blood sugar is over 150. Use 2 units more for every 50 over.  9  . ramipril (ALTACE) 10 MG capsule TAKE ONE (1) CAPSULE BY MOUTH 2 TIMES DAILY 180 capsule 1  . ranitidine (ZANTAC) 150 MG tablet Take 150 mg by mouth 2 (two) times daily.    Marland Kitchen  polyethylene glycol powder (GLYCOLAX/MIRALAX) powder Take 255 g by mouth once for 1 dose. Mix whole container with 64 ounces of clear liquids 255 g 0   No current facility-administered medications for this visit.     Review of Systems Review of Systems  Constitutional: Negative.   Respiratory: Negative.   Cardiovascular: Negative.   Gastrointestinal: Negative.     Blood pressure 132/78, pulse 70, resp. rate 16, height 5\' 10"  (1.778 m), weight (!) 302 lb (137 kg).  Physical Exam Physical Exam  Constitutional: He is oriented to person, place, and time. He appears well-developed and well-nourished.  Cardiovascular: Normal rate, regular rhythm and normal heart sounds.  Pulmonary/Chest: Effort normal and breath sounds normal.  Abdominal: Normal appearance. There is no hepatomegaly.  Neurological: He is alert and oriented to person, place, and time.   Skin: Skin is warm and dry.    Data Reviewed 09/09/14 colonoscopy: DIAGNOSIS:  A. COLON POLYP, CECUM; COLD BIOPSY:  - TUBULAR ADENOMA.  - NEGATIVE FOR HIGH-GRADE DYSPLASIA AND MALIGNANCY.   B. COLON POLYP, TRANSVERSE; COLD BIOPSY:  - SESSILE SERRATED ADENOMA/POLYP, MULTIPLE FRAGMENTS.  - NEGATIVE FOR CYTOLOGIC DYSPLASIA AND MALIGNANCY.  - CLINICAL CORRELATION IS ADVISED WITH REGARD TO COMPLETENESS OF  REMOVAL.   C. RECTUM POLYP; HOT SNARE:  - TUBULAR ADENOMA, 2 FRAGMENTS.  - NEGATIVE FOR HIGH-GRADE DYSPLASIA AND MALIGNANCY.  Laboratory studies dated September 14, 2017 showed normal electrolytes, creatinine of 0.9, estimated GFR of 85. Blood sugar 149.  Hemoglobin A1c: 8.0.   Assessment  Candidate for follow-up colonoscopy.  Plan  Colonoscopy with possible biopsy/polypectomy prn: Information regarding the procedure, including its potential risks and complications (including but not limited to perforation of the bowel, which may require emergency surgery to repair, and bleeding) was verbally given to the patient. Educational information regarding lower intestinal endoscopy was given to the patient. Written instructions for how to complete the bowel prep using Miralax were provided. The importance of drinking ample fluids to avoid dehydration as a result of the prep emphasized.  HPI, Physical Exam, Assessment and Plan have been scribed under the direction and in the presence of Hervey Ard, MD. Gaspar Cola, CMA  I have completed the exam and reviewed the above documentation for accuracy and completeness.  I agree with the above.  Haematologist has been used and any errors in dictation or transcription are unintentional.  Hervey Ard, M.D., F.A.C.S.  The patient is scheduled for a Colonoscopy at [facility] on 10/03/17. They are aware to call the day before to get their arrival time. He will decrease his Lantus to 40 units the day prior. He will take 1/2 of his does of Novolog  the day prior. he may stay on his 81 mg Aspirin. Miralax prescription has been sent into the patient's pharmacy. The patient is aware of date and instructions.  Documented by Caryl-Lyn Otis Brace LPN   [name] 09/18/2017, 8:04 PM

## 2017-09-19 DIAGNOSIS — Z794 Long term (current) use of insulin: Secondary | ICD-10-CM | POA: Diagnosis not present

## 2017-09-19 DIAGNOSIS — E1142 Type 2 diabetes mellitus with diabetic polyneuropathy: Secondary | ICD-10-CM | POA: Diagnosis not present

## 2017-09-19 DIAGNOSIS — E1165 Type 2 diabetes mellitus with hyperglycemia: Secondary | ICD-10-CM | POA: Diagnosis not present

## 2017-09-25 ENCOUNTER — Ambulatory Visit (INDEPENDENT_AMBULATORY_CARE_PROVIDER_SITE_OTHER): Payer: PPO | Admitting: Primary Care

## 2017-09-25 ENCOUNTER — Encounter: Payer: Self-pay | Admitting: Primary Care

## 2017-09-25 VITALS — BP 138/84 | HR 78 | Temp 98.0°F | Ht 70.0 in | Wt 308.2 lb

## 2017-09-25 DIAGNOSIS — J449 Chronic obstructive pulmonary disease, unspecified: Secondary | ICD-10-CM | POA: Diagnosis not present

## 2017-09-25 DIAGNOSIS — Z23 Encounter for immunization: Secondary | ICD-10-CM

## 2017-09-25 DIAGNOSIS — I10 Essential (primary) hypertension: Secondary | ICD-10-CM

## 2017-09-25 DIAGNOSIS — Z794 Long term (current) use of insulin: Secondary | ICD-10-CM

## 2017-09-25 DIAGNOSIS — E039 Hypothyroidism, unspecified: Secondary | ICD-10-CM | POA: Diagnosis not present

## 2017-09-25 DIAGNOSIS — M170 Bilateral primary osteoarthritis of knee: Secondary | ICD-10-CM | POA: Diagnosis not present

## 2017-09-25 DIAGNOSIS — J309 Allergic rhinitis, unspecified: Secondary | ICD-10-CM

## 2017-09-25 DIAGNOSIS — E7800 Pure hypercholesterolemia, unspecified: Secondary | ICD-10-CM | POA: Diagnosis not present

## 2017-09-25 DIAGNOSIS — E038 Other specified hypothyroidism: Secondary | ICD-10-CM

## 2017-09-25 DIAGNOSIS — E119 Type 2 diabetes mellitus without complications: Secondary | ICD-10-CM

## 2017-09-25 DIAGNOSIS — Z0000 Encounter for general adult medical examination without abnormal findings: Secondary | ICD-10-CM | POA: Insufficient documentation

## 2017-09-25 DIAGNOSIS — Z86010 Personal history of colonic polyps: Secondary | ICD-10-CM | POA: Diagnosis not present

## 2017-09-25 DIAGNOSIS — K219 Gastro-esophageal reflux disease without esophagitis: Secondary | ICD-10-CM | POA: Diagnosis not present

## 2017-09-25 MED ORDER — ZOSTER VAC RECOMB ADJUVANTED 50 MCG/0.5ML IM SUSR: 0.5000 mL | Freq: Once | INTRAMUSCULAR | 1 refills | Status: AC

## 2017-09-25 NOTE — Assessment & Plan Note (Signed)
Infrequent use of albuterol during seasonal changes.

## 2017-09-25 NOTE — Assessment & Plan Note (Signed)
TSH from January 2019 stable, continue levothyroxine 25 mcg.

## 2017-09-25 NOTE — Patient Instructions (Signed)
Take the shingles vaccination to the pharmacy in one month. This is a two dose series.  You were provided with a pneumonia vaccination today.  Start exercising. You should be getting 150 minutes of exercise weekly.  Ensure you are consuming 64 ounces of water daily.  Increase consumption of vegetables, fruit, whole grains, lean protein.  Take a look at the Advanced Directives packet.   Follow up with GI and endocrinology as scheduled.   Follow up with me in one year or sooner if needed.  It was a pleasure to see you today!   Preventive Care 65 Years and Older, Male Preventive care refers to lifestyle choices and visits with your health care provider that can promote health and wellness. What does preventive care include?  A yearly physical exam. This is also called an annual well check.  Dental exams once or twice a year.  Routine eye exams. Ask your health care provider how often you should have your eyes checked.  Personal lifestyle choices, including: ? Daily care of your teeth and gums. ? Regular physical activity. ? Eating a healthy diet. ? Avoiding tobacco and drug use. ? Limiting alcohol use. ? Practicing safe sex. ? Taking low doses of aspirin every day. ? Taking vitamin and mineral supplements as recommended by your health care provider. What happens during an annual well check? The services and screenings done by your health care provider during your annual well check will depend on your age, overall health, lifestyle risk factors, and family history of disease. Counseling Your health care provider may ask you questions about your:  Alcohol use.  Tobacco use.  Drug use.  Emotional well-being.  Home and relationship well-being.  Sexual activity.  Eating habits.  History of falls.  Memory and ability to understand (cognition).  Work and work environment.  Screening You may have the following tests or measurements:  Height, weight, and  BMI.  Blood pressure.  Lipid and cholesterol levels. These may be checked every 5 years, or more frequently if you are over 50 years old.  Skin check.  Lung cancer screening. You may have this screening every year starting at age 55 if you have a 30-pack-year history of smoking and currently smoke or have quit within the past 15 years.  Fecal occult blood test (FOBT) of the stool. You may have this test every year starting at age 50.  Flexible sigmoidoscopy or colonoscopy. You may have a sigmoidoscopy every 5 years or a colonoscopy every 10 years starting at age 50.  Prostate cancer screening. Recommendations will vary depending on your family history and other risks.  Hepatitis C blood test.  Hepatitis B blood test.  Sexually transmitted disease (STD) testing.  Diabetes screening. This is done by checking your blood sugar (glucose) after you have not eaten for a while (fasting). You may have this done every 1-3 years.  Abdominal aortic aneurysm (AAA) screening. You may need this if you are a current or former smoker.  Osteoporosis. You may be screened starting at age 70 if you are at high risk.  Talk with your health care provider about your test results, treatment options, and if necessary, the need for more tests. Vaccines Your health care provider may recommend certain vaccines, such as:  Influenza vaccine. This is recommended every year.  Tetanus, diphtheria, and acellular pertussis (Tdap, Td) vaccine. You may need a Td booster every 10 years.  Varicella vaccine. You may need this if you have not been vaccinated.    Zoster vaccine. You may need this after age 60.  Measles, mumps, and rubella (MMR) vaccine. You may need at least one dose of MMR if you were born in 1957 or later. You may also need a second dose.  Pneumococcal 13-valent conjugate (PCV13) vaccine. One dose is recommended after age 65.  Pneumococcal polysaccharide (PPSV23) vaccine. One dose is recommended  after age 65.  Meningococcal vaccine. You may need this if you have certain conditions.  Hepatitis A vaccine. You may need this if you have certain conditions or if you travel or work in places where you may be exposed to hepatitis A.  Hepatitis B vaccine. You may need this if you have certain conditions or if you travel or work in places where you may be exposed to hepatitis B.  Haemophilus influenzae type b (Hib) vaccine. You may need this if you have certain risk factors.  Talk to your health care provider about which screenings and vaccines you need and how often you need them. This information is not intended to replace advice given to you by your health care provider. Make sure you discuss any questions you have with your health care provider. Document Released: 07/30/2015 Document Revised: 03/22/2016 Document Reviewed: 05/04/2015 Elsevier Interactive Patient Education  2018 Elsevier Inc.  

## 2017-09-25 NOTE — Assessment & Plan Note (Signed)
Following with endocrinology, recent A1C of 8.0. Continue prescribed regimen. Discussed importance of annual eye exam. Prevnar 13 provided today. Managed on ACE.

## 2017-09-25 NOTE — Assessment & Plan Note (Signed)
Stable on Zantac, continues same. Discussed the importance of a healthy diet and regular exercise in order for weight loss, and to reduce the risk of any potential medical problems.

## 2017-09-25 NOTE — Assessment & Plan Note (Signed)
Seasonal, uses Flonase and albuterol PRN.

## 2017-09-25 NOTE — Assessment & Plan Note (Signed)
Stable in the office today, continue HCTZ 50 mg and ramipril 10 mg. BMP unremarkable per Care Everywhere.

## 2017-09-25 NOTE — Addendum Note (Signed)
Addended by: Jacqualin Combes on: 09/25/2017 10:37 AM   Modules accepted: Orders

## 2017-09-25 NOTE — Progress Notes (Signed)
Patient ID: [name], male   DOB: February 28, 1951, 67 y.o.   MRN: [id number]  [name] is a 67 year old male who presents today for Welcome To Medicare Visit.  HPI:  Past Medical History:  Diagnosis Date  . Arthritis    knee  . Bronchitis   . Colon polyp 2008  . Diabetes mellitus   . Hypertension     Current Outpatient Medications  Medication Sig Dispense Refill  . albuterol (PROVENTIL HFA;VENTOLIN HFA) 108 (90 Base) MCG/ACT inhaler Inhale 2 puffs into the lungs every 4 (four) hours as needed for wheezing or shortness of breath. 1 Inhaler 0  . aspirin EC 81 MG tablet Take 81 mg by mouth daily.    . fluticasone (FLONASE) 50 MCG/ACT nasal spray Place 1 spray into both nostrils 2 (two) times daily as needed for allergies or rhinitis. 16 g 0  . hydrochlorothiazide (HYDRODIURIL) 50 MG tablet Take 1 tablet (50 mg total) by mouth daily. 90 tablet 1  . Insulin Glargine (LANTUS SOLOSTAR) 100 UNIT/ML Solostar Pen Inject 10 Units into the skin daily at 10 pm. Increase by 2 units daily until fasting is 130 in am. (Patient taking differently: Inject 68 units at bed time.) 5 pen PRN  . levothyroxine (SYNTHROID, LEVOTHROID) 25 MCG tablet TAKE 1 TABLET BY MOUTH EVERY MORNING 30 MINUTES BEFORE BREAKFAST. 30 tablet 5  . metFORMIN (GLUCOPHAGE) 1000 MG tablet TAKE 1 TABLET BY MOUTH TWICE A DAY 180 tablet 1  . NOVOLOG FLEXPEN 100 UNIT/ML FlexPen Inject 20 units before breakfast and lunch, inject 22 units before supper. Increase when blood sugar is over 150. Use 2 units more for every 50 over.  9  . ramipril (ALTACE) 10 MG capsule TAKE ONE (1) CAPSULE BY MOUTH 2 TIMES DAILY 180 capsule 1  . ranitidine (ZANTAC) 150 MG tablet Take 150 mg by mouth 2 (two) times daily.     No current facility-administered medications for this visit.     Allergies  Allergen Reactions  . Atorvastatin     Other reaction(s): Headache  . Other Other (See Comments)    STATIN DRUG-CAUSED MUSCLE PAIN/LETHARGIC    Family History   Problem Relation Age of Onset  . Stroke Mother   . Diabetes Mother   . Stroke Father   . Diabetes Other   . Other Daughter 25       precancerous colon polyp   . Diabetes Maternal Grandmother   . Diabetes Paternal Grandmother     Social History   Socioeconomic History  . Marital status: Married    Spouse name: Not on file  . Number of children: Not on file  . Years of education: Not on file  . Highest education level: Not on file  Social Needs  . Financial resource strain: Not on file  . Food insecurity - worry: Not on file  . Food insecurity - inability: Not on file  . Transportation needs - medical: Not on file  . Transportation needs - non-medical: Not on file  Occupational History  . Not on file  Tobacco Use  . Smoking status: Former Smoker    Packs/day: 2.00    Years: 35.00    Pack years: 70.00    Types: Cigarettes, E-cigarettes    Last attempt to quit: 12/11/2014    Years since quitting: 2.7  . Smokeless tobacco: Never Used  Substance and Sexual Activity  . Alcohol use: No    Alcohol/week: 0.0 oz  . Drug use: No  .  Sexual activity: Not on file  Other Topics Concern  . Not on file  Social History Narrative   Married.   3 children. 2 grandchildren.   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     Hospitiliaztions: None  Health Maintenance:    Flu: Completed this season   Tetanus: Completed in 2016  Pneumovax: Completed in 2017  Prevnar: Due today  Zostavax: Never completed, Rx printed  Colonoscopy: Completed in 2016, due for repeat colonoscopy next week.  Eye Doctor: No recent exam  Dental Exam: No recent exam  PSA: Negative in 2017  Hep C Screening: Due    Providers: Alma Friendly, PCP; Dr. Nonie Hoyer; Dr. Gabriel Carina    I have personally reviewed and have noted: 1. The patient's medical and social history 2. Their use of alcohol, tobacco or illicit drugs 3. Their current medications and  supplements 4. The patient's functional ability including ADL's, fall risks, home safety risks  and hearing or visual impairment. 5. Diet and physical activities 6. Evidence for depression or mood disorder  Subjective:   Review of Systems:   Constitutional: Denies fever, malaise, fatigue, headache or abrupt weight changes.  HEENT: Denies eye pain, eye redness, ear pain, ringing in the ears, wax buildup, runny nose, nasal congestion, bloody nose, or sore throat. Respiratory: Denies difficulty breathing, shortness of breath, cough or sputum production.   Cardiovascular: Denies chest pain, chest tightness, palpitations or swelling in the hands or feet.  Gastrointestinal: Denies abdominal pain, bloating, constipation, diarrhea or blood in the stool.  GU: Denies urgency, frequency, pain with urination, burning sensation, blood in urine, odor or discharge. Musculoskeletal: Chronic bilateral knee pain.  Skin: Denies redness, rashes, lesions or ulcercations. Wound from prior visit has healed. Neurological: Denies dizziness, difficulty with memory, difficulty with speech or problems with balance and coordination.  Psychiatric: Denies concerns for anxiety or depression.   No other specific complaints in a complete review of systems (except as listed in HPI above).  Objective:  PE:   BP 138/84   Pulse 78   Temp 98 F (36.7 C) (Oral)   Ht 5\' 10"  (1.778 m)   Wt (!) 308 lb 4 oz (139.8 kg)   SpO2 97%   BMI 44.23 kg/m  Wt Readings from Last 3 Encounters:  09/25/17 (!) 308 lb 4 oz (139.8 kg)  09/18/17 (!) 302 lb (137 kg)  06/27/17 300 lb (136.1 kg)    General: Appears their stated age, well developed, well nourished in NAD. Skin: Warm, dry and intact. No rashes, lesions or ulcerations noted. HEENT: Head: normal shape and size; Eyes: sclera white, no icterus, conjunctiva pink, PERRLA and EOMs intact; Ears: Tm's gray and intact, normal light reflex; Nose: mucosa pink and moist, septum  midline; Throat/Mouth: Teeth present, mucosa pink and moist, no exudate, lesions or ulcerations noted.  Neck: Normal range of motion. Neck supple, trachea midline. No massses, lumps or thyromegaly present.  Cardiovascular: Normal rate and rhythm. S1,S2 noted.  No murmur, rubs or gallops noted. No JVD or BLE edema. No carotid bruits noted. Pulmonary/Chest: Normal effort and positive vesicular breath sounds. No respiratory distress. No wheezes, rales or ronchi noted.  Abdomen: Soft and nontender. Normal bowel sounds, no bruits noted. No distention or masses noted. Liver, spleen and kidneys non palpable. Musculoskeletal: General decrease in ROM to bilateral knees. No signs of joint swelling. Steady gait.  Neurological: Alert and oriented. Cranial nerves II-XII intact. Coordination normal. +  DTRs bilaterally. Psychiatric: Mood and affect normal. Behavior is normal. Judgment and thought content normal.   EKG: NSR with rate of 77. No ST elevation/depression, PAC/PVC, t-wave inversion.   BMET    Component Value Date/Time   NA 142 06/01/2016 0750   NA 139 02/19/2015 0923   K 4.4 06/01/2016 0750   CL 102 06/01/2016 0750   CO2 32 06/01/2016 0750   GLUCOSE 102 (H) 06/01/2016 0750   BUN 15 06/01/2016 0750   BUN 20 02/19/2015 0923   CREATININE 1.07 06/01/2016 0750   CALCIUM 10.1 06/01/2016 0750   GFRNONAA 76 02/19/2015 0923   GFRAA 88 02/19/2015 0923    Lipid Panel     Component Value Date/Time   CHOL 152 06/01/2016 0750   CHOL 151 02/19/2015 0923   TRIG 106.0 06/01/2016 0750   HDL 33.70 (L) 06/01/2016 0750   HDL 42 02/19/2015 0923   CHOLHDL 4 06/01/2016 0750   VLDL 21.2 06/01/2016 0750   LDLCALC 97 06/01/2016 0750   LDLCALC 82 02/19/2015 0923    CBC    Component Value Date/Time   WBC 8.1 02/19/2015 0923   WBC 6.9 01/12/2012 1909   RBC 4.68 02/19/2015 0923   RBC 4.40 01/12/2012 1909   HGB 14.2 02/19/2015 0923   HCT 41.3 02/19/2015 0923   PLT 269 02/19/2015 0923   MCV 88  02/19/2015 0923   MCH 30.3 02/19/2015 0923   MCH 30.9 01/12/2012 1909   MCHC 34.4 02/19/2015 0923   MCHC 34.8 01/12/2012 1909   RDW 13.0 02/19/2015 0923   LYMPHSABS 2.6 02/19/2015 0923   EOSABS 0.2 02/19/2015 0923   BASOSABS 0.0 02/19/2015 0923    Hgb A1C Lab Results  Component Value Date   HGBA1C 7.5 (H) 06/01/2016      Assessment and Plan:   Medicare Annual Wellness Visit:  Diet: He endorsees a poor diet. Breakfast: Baked ham, bacon, eggs Lunch: Salad, sandwich Dinner: Salad, vegetables, meat Snacks: Occasionally  Desserts: Yogurt, sugar free cookies Beverages: Unsweet tea, coffee, little water Physical activity: He is mostly sedentary, chronic knee pain Depression/mood screen: Negative Hearing: Intact to whispered voice Visual acuity: Grossly normal, performs annual eye exam  ADLs: Capable Fall risk: None Home safety: Good Cognitive evaluation: Intact to orientation, naming, recall and repetition EOL planning: Adv directives not completed, full code  Preventative Medicine: Prevnar 13 due today, Shingrix Rx provided today. Other immunizations UTD. Colonoscopy due next week, he is scheduled. PSA UTD. Discussed the importance of a healthy diet and regular exercise in order for weight loss, and to reduce the risk of any potential medical problems. Advance directives not completed, packet provided today. ECG completed today. Full code. All recommendations provided at end of visit.   Next appointment: One year.

## 2017-09-25 NOTE — Assessment & Plan Note (Signed)
Discussed to work on weight loss. Patient interested in seeing Sports Medicine and will schedule on the way out.

## 2017-09-25 NOTE — Assessment & Plan Note (Signed)
Lipid panel stable per Care Everywhere. Continue fenofibrate. Discussed the importance of a healthy diet and regular exercise in order for weight loss, and to reduce the risk of any potential medical problems.

## 2017-09-25 NOTE — Assessment & Plan Note (Signed)
Prevnar 13 due today, Shingrix Rx provided today. Other immunizations UTD. Colonoscopy due next week, he is scheduled. PSA UTD. Discussed the importance of a healthy diet and regular exercise in order for weight loss, and to reduce the risk of any potential medical problems. Advance directives not completed, packet provided today. ECG completed today. Full code.   I have personally reviewed and have noted: 1. The patient's medical and social history 2. Their use of alcohol, tobacco or illicit drugs 3. Their current medications and supplements 4. The patient's functional ability including ADL's, fall  risks, home safety risks and hearing or visual  impairment. 5. Diet and physical activities 6. Evidence for depression or mood disorder

## 2017-09-25 NOTE — Assessment & Plan Note (Signed)
Due for repeat colonoscopy next week.

## 2017-09-27 ENCOUNTER — Ambulatory Visit: Payer: Self-pay | Admitting: *Deleted

## 2017-09-27 NOTE — Telephone Encounter (Signed)
Patient is calling to report that he is having a local reaction to the vaccine he had on Tuesday. He reports redness and soreness at injection site the size if softball. Call to office- they will check with provider to see if he needs to be evaluated for infection. Reason for Disposition . [1] Redness or red streak around the injection site AND [2] begins > 48 hours after shot AND [3] no fever  (Exception: red area < 1 inch or 2.5 cm wide)  Answer Assessment - Initial Assessment Questions 1. SYMPTOMS: "What is the main symptom?" (e.g., redness, swelling, pain)      Redness and sore in left arm at injection site 2. ONSET: "When was the vaccine (shot) given?" "How much later did the __________ begin?" (e.g., hours, days ago)      Tuesday- yesterday soreness 3. SEVERITY: "How bad is it?"      Redness is size of softball 4. FEVER: "Is there a fever?" If so, ask: "What is it, how was it measured, and when did it start?"      no 5. IMMUNIZATIONS GIVEN: "What shots have you recently received?"     pneumonia vaccine 6. PAST REACTIONS: "Have you reacted to immunizations before?" If so, ask: "What happened?"     Tetanus  vaccine got red 7. OTHER SYMPTOMS: "Do you have any other symptoms?"     no  Protocols used: IMMUNIZATION REACTIONS-A-AH

## 2017-09-27 NOTE — Telephone Encounter (Signed)
Request given to Lynn because it requires security access to schedule. 

## 2017-09-27 NOTE — Telephone Encounter (Signed)
Please have patient seen tomorrow in the office, add him on my schedule at either the 10 am opening or create an 11:45 am slot. Have him put ice on the area of swelling three times daily for 30 minute intervals.

## 2017-09-28 ENCOUNTER — Ambulatory Visit (INDEPENDENT_AMBULATORY_CARE_PROVIDER_SITE_OTHER): Payer: PPO | Admitting: Primary Care

## 2017-09-28 ENCOUNTER — Encounter: Payer: Self-pay | Admitting: Primary Care

## 2017-09-28 VITALS — BP 140/62 | HR 95 | Temp 98.2°F | Ht 70.0 in | Wt 305.1 lb

## 2017-09-28 DIAGNOSIS — S40022A Contusion of left upper arm, initial encounter: Secondary | ICD-10-CM | POA: Diagnosis not present

## 2017-09-28 NOTE — Progress Notes (Signed)
Subjective:    Patient ID: [name], male    DOB: October 13, 1950, 67 y.o.   MRN: [id number]  HPI [name] is a 67 y.o. male who presents today with Left Arm Pain. Recently in for Welcome to Medicare check and received Prevnar during visit. He reports the injection site started having redness, pain, and heat at the injection site. He is unable to lie on the shoulder at night has not taken anything to relieve the pain. Denies fever and chills.  Review of Systems  Constitutional: Negative for fatigue.  Respiratory: Negative for chest tightness and shortness of breath.   Cardiovascular: Negative for chest pain.  Musculoskeletal: Negative for myalgias.       Other than L Deltoid pain  Skin: Negative for rash and wound.  Neurological: Negative for weakness and numbness.       Past Medical History:  Diagnosis Date  . Arthritis    knee  . Bronchitis   . Colon polyp 2008  . Diabetes mellitus   . Hypertension    Past Surgical History:  Procedure Laterality Date  . COLONOSCOPY  2008, 2016   Dr Bary Castilla  . DERMATOFIBROMA  03/26/15   Procedure: Excision mass left leg;  Surgeon: Robert Bellow, MD;  Location: [facility] ORS;  Service: General;  Laterality: N/A;  . LIPOMA EXCISION N/A 03/26/2015      . TONSILLECTOMY     Family History  Problem Relation Age of Onset  . Stroke Mother   . Diabetes Mother   . Stroke Father   . Diabetes Other   . Other Daughter 25       precancerous colon polyp   . Diabetes Maternal Grandmother   . Diabetes Paternal Grandmother    Social History   Socioeconomic History  . Marital status: Married    Spouse name: Not on file  . Number of children: Not on file  . Years of education: Not on file  . Highest education level: Not on file  Social Needs  . Financial resource strain: Not on file  . Food insecurity - worry: Not on file  . Food insecurity - inability: Not on file  . Transportation needs - medical: Not on file  . Transportation needs -  non-medical: Not on file  Occupational History  . Not on file  Tobacco Use  . Smoking status: Former Smoker    Packs/day: 2.00    Years: 35.00    Pack years: 70.00    Types: Cigarettes, E-cigarettes    Last attempt to quit: 12/11/2014    Years since quitting: 2.8  . Smokeless tobacco: Never Used  Substance and Sexual Activity  . Alcohol use: No    Alcohol/week: 0.0 oz  . Drug use: No  . Sexual activity: Not on file  Other Topics Concern  . Not on file  Social History Narrative   Married.   3 children. 2 grandchildren.   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    Current Outpatient Medications on File Prior to Visit  Medication Sig Dispense Refill  . albuterol (PROVENTIL HFA;VENTOLIN HFA) 108 (90 Base) MCG/ACT inhaler Inhale 2 puffs into the lungs every 4 (four) hours as needed for wheezing or shortness of breath. 1 Inhaler 0  . aspirin EC 81 MG tablet Take 81 mg by mouth daily.    . fluticasone (FLONASE) 50 MCG/ACT nasal spray Place 1 spray into both nostrils 2 (  two) times daily as needed for allergies or rhinitis. 16 g 0  . hydrochlorothiazide (HYDRODIURIL) 50 MG tablet Take 1 tablet (50 mg total) by mouth daily. 90 tablet 1  . Insulin Glargine (LANTUS SOLOSTAR) 100 UNIT/ML Solostar Pen Inject 10 Units into the skin daily at 10 pm. Increase by 2 units daily until fasting is 130 in am. (Patient taking differently: Inject 68 units at bed time.) 5 pen PRN  . levothyroxine (SYNTHROID, LEVOTHROID) 25 MCG tablet TAKE 1 TABLET BY MOUTH EVERY MORNING 30 MINUTES BEFORE BREAKFAST. 30 tablet 5  . metFORMIN (GLUCOPHAGE) 1000 MG tablet TAKE 1 TABLET BY MOUTH TWICE A DAY 180 tablet 1  . NOVOLOG FLEXPEN 100 UNIT/ML FlexPen Inject 20 units before breakfast and lunch, inject 22 units before supper. Increase when blood sugar is over 150. Use 2 units more for every 50 over.  9  . ramipril (ALTACE) 10 MG capsule TAKE ONE (1) CAPSULE BY MOUTH 2 TIMES  DAILY 180 capsule 1  . ranitidine (ZANTAC) 150 MG tablet Take 150 mg by mouth 2 (two) times daily.     No current facility-administered medications on file prior to visit.     Objective:   Physical Exam  Constitutional: He appears well-nourished. No distress.  Cardiovascular: Normal rate, regular rhythm and normal pulses.  Pulses:      Radial pulses are 2+ on the right side, and 2+ on the left side.  Pulmonary/Chest: Effort normal.  Musculoskeletal: Normal range of motion.       Left shoulder: He exhibits tenderness and pain.  Round, hard, immoveable mass palpated measuring 6cmx8cm  Neurological: He has normal strength.    BP 140/62 (BP Location: Right Arm, Patient Position: Sitting, Cuff Size: Large)   Pulse 95   Temp 98.2 F (36.8 C) (Oral)   Ht 5\' 10"  (1.778 m)   Wt (!) 305 lb 1.9 oz (138.4 kg)   BMI 43.78 kg/m       Assessment & Plan:   1. Hematoma of arm, left, initial encounter - Likely from Prevnar injection - Alternate heat and ice as needed. Wrap ice in a towel and do not keep heat or ice on skin longer than 20 minutes - Take Tylenol 650mg  every 4-6 hours as need for pain. Do not exceed 3000mg  per day.

## 2017-09-28 NOTE — Progress Notes (Signed)
Subjective:    Patient ID: [name], male    DOB: July 29, 1950, 67 y.o.   MRN: [id number]  HPI  Mr. Cordial is a 67 year old male who presents today with a chief complaint of upper extremity pain, redness, and swelling that began Wednesday night this week. He received the the Prevnar 13 Tuesday afternoon.   He denies prior reactions to the pneumovax vaccinations, flu vaccinations. He denies fevers, chills, increased symptoms, throat tightness, SOB, wheezing. Over the last 24 hours his swelling, redness, and discomfort have improved.    Review of Systems  Constitutional: Negative for fever.  Skin: Positive for color change. Negative for wound.       Past Medical History:  Diagnosis Date  . Arthritis    knee  . Bronchitis   . Colon polyp 2008  . Diabetes mellitus   . Hypertension      Social History   Socioeconomic History  . Marital status: Married    Spouse name: Not on file  . Number of children: Not on file  . Years of education: Not on file  . Highest education level: Not on file  Social Needs  . Financial resource strain: Not on file  . Food insecurity - worry: Not on file  . Food insecurity - inability: Not on file  . Transportation needs - medical: Not on file  . Transportation needs - non-medical: Not on file  Occupational History  . Not on file  Tobacco Use  . Smoking status: Former Smoker    Packs/day: 2.00    Years: 35.00    Pack years: 70.00    Types: Cigarettes, E-cigarettes    Last attempt to quit: 12/11/2014    Years since quitting: 2.8  . Smokeless tobacco: Never Used  Substance and Sexual Activity  . Alcohol use: No    Alcohol/week: 0.0 oz  . Drug use: No  . Sexual activity: Not on file  Other Topics Concern  . Not on file  Social History Narrative   Married.   3 children. 2 grandchildren.   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     Past Surgical History:  Procedure Laterality Date    . COLONOSCOPY  2008, 2016   Dr Bary Castilla  . DERMATOFIBROMA  03/26/15   Procedure: Excision mass left leg;  Surgeon: Robert Bellow, MD;  Location: [facility] ORS;  Service: General;  Laterality: N/A;  . LIPOMA EXCISION N/A 03/26/2015      . TONSILLECTOMY      Family History  Problem Relation Age of Onset  . Stroke Mother   . Diabetes Mother   . Stroke Father   . Diabetes Other   . Other Daughter 25       precancerous colon polyp   . Diabetes Maternal Grandmother   . Diabetes Paternal Grandmother     Allergies  Allergen Reactions  . Atorvastatin     Other reaction(s): Headache  . Other Other (See Comments)    STATIN DRUG-CAUSED MUSCLE PAIN/LETHARGIC    Current Outpatient Medications on File Prior to Visit  Medication Sig Dispense Refill  . albuterol (PROVENTIL HFA;VENTOLIN HFA) 108 (90 Base) MCG/ACT inhaler Inhale 2 puffs into the lungs every 4 (four) hours as needed for wheezing or shortness of breath. 1 Inhaler 0  . aspirin EC 81 MG tablet Take 81 mg by mouth daily.    . fluticasone (FLONASE) 50 MCG/ACT nasal spray  Place 1 spray into both nostrils 2 (two) times daily as needed for allergies or rhinitis. 16 g 0  . hydrochlorothiazide (HYDRODIURIL) 50 MG tablet Take 1 tablet (50 mg total) by mouth daily. 90 tablet 1  . Insulin Glargine (LANTUS SOLOSTAR) 100 UNIT/ML Solostar Pen Inject 10 Units into the skin daily at 10 pm. Increase by 2 units daily until fasting is 130 in am. (Patient taking differently: Inject 68 units at bed time.) 5 pen PRN  . levothyroxine (SYNTHROID, LEVOTHROID) 25 MCG tablet TAKE 1 TABLET BY MOUTH EVERY MORNING 30 MINUTES BEFORE BREAKFAST. 30 tablet 5  . metFORMIN (GLUCOPHAGE) 1000 MG tablet TAKE 1 TABLET BY MOUTH TWICE A DAY 180 tablet 1  . NOVOLOG FLEXPEN 100 UNIT/ML FlexPen Inject 20 units before breakfast and lunch, inject 22 units before supper. Increase when blood sugar is over 150. Use 2 units more for every 50 over.  9  . ramipril (ALTACE) 10 MG capsule  TAKE ONE (1) CAPSULE BY MOUTH 2 TIMES DAILY 180 capsule 1  . ranitidine (ZANTAC) 150 MG tablet Take 150 mg by mouth 2 (two) times daily.     No current facility-administered medications on file prior to visit.     BP 140/62 (BP Location: Right Arm, Patient Position: Sitting, Cuff Size: Large)   Pulse 95   Temp 98.2 F (36.8 C) (Oral)   Ht 5\' 10"  (1.778 m)   Wt (!) 305 lb 1.9 oz (138.4 kg)   BMI 43.78 kg/m    Objective:   Physical Exam  Constitutional: He appears well-nourished.  Skin: Skin is warm and dry. There is erythema.     Mild erythema circumferentially to injection site without warmth or swelling.           Assessment & Plan:  Vaccination Reaction:  Erythema, swelling, discomfort within 24 hours after Prevar vaccination. Exam today seems significantly improved based off of reports from the last 24 hours. No sign of cellulitis, major allergic reaction. Start with Ice, tylenol. Return precautions provided.   Pleas Koch, NP

## 2017-09-28 NOTE — Patient Instructions (Addendum)
You have a hematoma from the injection, probably under the muscle. It is likely that you may notice some discoloration in the coming days that should subside within 7-10 days.   Alternate heat and ice as needed. Wrap ice in a towel and do not keep heat or ice on skin longer than 20 minutes. Take Tylenol 650mg  every 4-6 hours as need for pain. Do not exceed 3000mg  per day.  Please notify me if you develop redness, swelling, increased pain to the site.  It has been a pleasure seeing you today. Denita Lung, RN, Adult-Geriatric Nurse Practitioner Student and Allie Bossier, AGNP   Hematoma A hematoma is a collection of blood. The collection of blood can turn into a hard, painful lump under the skin. Your skin may turn blue or yellow if the hematoma is close to the surface of the skin. Most hematomas get better in a few days to weeks. Some hematomas are serious and need medical care. Hematomas can be very small or very big. Follow these instructions at home:  Apply ice to the injured area: ? Put ice in a plastic bag. ? Place a towel between your skin and the bag. ? Leave the ice on for 20 minutes, 2-3 times a day for the first 1 to 2 days.  After the first 2 days, switch to using warm packs on the injured area.  Raise (elevate) the injured area to lessen pain and puffiness (swelling). You may also wrap the area with an elastic bandage. Make sure the bandage is not wrapped too tight.  If you have a painful hematoma on your leg or foot, you may use crutches for a couple days.  Only take medicines as told by your doctor. Get help right away if:  Your pain gets worse.  Your pain is not controlled with medicine.  You have a fever.  Your puffiness gets worse.  Your skin turns more blue or yellow.  Your skin over the hematoma breaks or starts bleeding.  Your hematoma is in your chest or belly (abdomen) and you are short of breath, feel weak, or have a change in consciousness.  Your  hematoma is on your scalp and you have a headache that gets worse or a change in alertness or consciousness. This information is not intended to replace advice given to you by your health care provider. Make sure you discuss any questions you have with your health care provider. Document Released: 08/10/2004 Document Revised: 12/09/2015 Document Reviewed: 12/11/2012 Elsevier Interactive Patient Education  2017 [employer].

## 2017-10-01 ENCOUNTER — Other Ambulatory Visit: Payer: Self-pay | Admitting: Primary Care

## 2017-10-01 DIAGNOSIS — E039 Hypothyroidism, unspecified: Secondary | ICD-10-CM

## 2017-10-03 ENCOUNTER — Encounter: Payer: Self-pay | Admitting: *Deleted

## 2017-10-03 ENCOUNTER — Encounter
Admission: RE | Disposition: A | Discharge status: Home / Self Care | Payer: Self-pay | Source: Ambulatory Visit | Attending: General Surgery

## 2017-10-03 ENCOUNTER — Other Ambulatory Visit: Payer: Self-pay | Admitting: Primary Care

## 2017-10-03 ENCOUNTER — Ambulatory Visit
Admission: RE | Admit: 2017-10-03 | Discharge: 2017-10-03 | Disposition: A | Discharge status: Home / Self Care | Payer: PPO | Source: Ambulatory Visit | Attending: General Surgery | Admitting: General Surgery

## 2017-10-03 ENCOUNTER — Ambulatory Visit: Payer: PPO | Admitting: Certified Registered Nurse Anesthetist

## 2017-10-03 DIAGNOSIS — Z6841 Body Mass Index (BMI) 40.0 and over, adult: Secondary | ICD-10-CM | POA: Insufficient documentation

## 2017-10-03 DIAGNOSIS — I10 Essential (primary) hypertension: Secondary | ICD-10-CM | POA: Insufficient documentation

## 2017-10-03 DIAGNOSIS — K621 Rectal polyp: Secondary | ICD-10-CM | POA: Insufficient documentation

## 2017-10-03 DIAGNOSIS — Z888 Allergy status to other drugs, medicaments and biological substances status: Secondary | ICD-10-CM | POA: Diagnosis not present

## 2017-10-03 DIAGNOSIS — R0600 Dyspnea, unspecified: Secondary | ICD-10-CM | POA: Diagnosis not present

## 2017-10-03 DIAGNOSIS — J449 Chronic obstructive pulmonary disease, unspecified: Secondary | ICD-10-CM | POA: Diagnosis not present

## 2017-10-03 DIAGNOSIS — Z86010 Personal history of colonic polyps: Secondary | ICD-10-CM | POA: Insufficient documentation

## 2017-10-03 DIAGNOSIS — K635 Polyp of colon: Secondary | ICD-10-CM | POA: Diagnosis not present

## 2017-10-03 DIAGNOSIS — Z1211 Encounter for screening for malignant neoplasm of colon: Secondary | ICD-10-CM | POA: Insufficient documentation

## 2017-10-03 DIAGNOSIS — Z7982 Long term (current) use of aspirin: Secondary | ICD-10-CM | POA: Diagnosis not present

## 2017-10-03 DIAGNOSIS — E669 Obesity, unspecified: Secondary | ICD-10-CM | POA: Insufficient documentation

## 2017-10-03 DIAGNOSIS — Z794 Long term (current) use of insulin: Secondary | ICD-10-CM | POA: Insufficient documentation

## 2017-10-03 DIAGNOSIS — Z87891 Personal history of nicotine dependence: Secondary | ICD-10-CM | POA: Diagnosis not present

## 2017-10-03 DIAGNOSIS — E119 Type 2 diabetes mellitus without complications: Secondary | ICD-10-CM | POA: Insufficient documentation

## 2017-10-03 DIAGNOSIS — M1710 Unilateral primary osteoarthritis, unspecified knee: Secondary | ICD-10-CM | POA: Diagnosis not present

## 2017-10-03 DIAGNOSIS — E039 Hypothyroidism, unspecified: Secondary | ICD-10-CM | POA: Diagnosis not present

## 2017-10-03 DIAGNOSIS — Z79899 Other long term (current) drug therapy: Secondary | ICD-10-CM | POA: Insufficient documentation

## 2017-10-03 DIAGNOSIS — E7800 Pure hypercholesterolemia, unspecified: Secondary | ICD-10-CM | POA: Insufficient documentation

## 2017-10-03 DIAGNOSIS — D123 Benign neoplasm of transverse colon: Secondary | ICD-10-CM | POA: Insufficient documentation

## 2017-10-03 DIAGNOSIS — Z7951 Long term (current) use of inhaled steroids: Secondary | ICD-10-CM | POA: Insufficient documentation

## 2017-10-03 DIAGNOSIS — K219 Gastro-esophageal reflux disease without esophagitis: Secondary | ICD-10-CM | POA: Insufficient documentation

## 2017-10-03 DIAGNOSIS — R0681 Apnea, not elsewhere classified: Secondary | ICD-10-CM

## 2017-10-03 HISTORY — DX: Hypothyroidism, unspecified: E03.9

## 2017-10-03 HISTORY — DX: Pure hypercholesterolemia, unspecified: E78.00

## 2017-10-03 HISTORY — PX: COLONOSCOPY WITH PROPOFOL: SHX5780

## 2017-10-03 HISTORY — DX: Dyspnea, unspecified: R06.00

## 2017-10-03 LAB — GLUCOSE, CAPILLARY: Glucose-Capillary: 158 mg/dL — ABNORMAL HIGH (ref 65–99)

## 2017-10-03 SURGERY — COLONOSCOPY WITH PROPOFOL
Anesthesia: General

## 2017-10-03 MED ORDER — PROPOFOL 500 MG/50ML IV EMUL
INTRAVENOUS | Status: DC | PRN
  Administered 2017-10-03: 150 ug/kg/min via INTRAVENOUS

## 2017-10-03 MED ORDER — PROPOFOL 10 MG/ML IV BOLUS
INTRAVENOUS | Status: AC
  Filled 2017-10-03: qty 20

## 2017-10-03 MED ORDER — PROPOFOL 10 MG/ML IV BOLUS
INTRAVENOUS | Status: DC | PRN
  Administered 2017-10-03: 70 mg via INTRAVENOUS

## 2017-10-03 MED ORDER — GLYCOPYRROLATE 0.2 MG/ML IJ SOLN
INTRAMUSCULAR | Status: AC
  Filled 2017-10-03: qty 1

## 2017-10-03 MED ORDER — GLYCOPYRROLATE 0.2 MG/ML IJ SOLN
INTRAMUSCULAR | Status: DC | PRN
  Administered 2017-10-03: 0.2 mg via INTRAVENOUS

## 2017-10-03 MED ORDER — SODIUM CHLORIDE 0.9 % IV SOLN
INTRAVENOUS | Status: DC
  Administered 2017-10-03: 1000 mL via INTRAVENOUS

## 2017-10-03 MED ORDER — PROPOFOL 500 MG/50ML IV EMUL
INTRAVENOUS | Status: AC
  Filled 2017-10-03: qty 50

## 2017-10-03 MED ORDER — OXYMETAZOLINE HCL 0.05 % NA SOLN
NASAL | Status: AC
  Filled 2017-10-03: qty 15

## 2017-10-03 NOTE — H&P (Signed)
[name] [id number] April 16, 1951     HPI: Follow up colonoscopy for multiple polyps. Tolerated prep well.  BS nadir 91 during prep.   Medications Prior to Admission  Medication Sig Dispense Refill Last Dose  . albuterol (PROVENTIL HFA;VENTOLIN HFA) 108 (90 Base) MCG/ACT inhaler Inhale 2 puffs into the lungs every 4 (four) hours as needed for wheezing or shortness of breath. 1 Inhaler 0 Taking  . aspirin EC 81 MG tablet Take 81 mg by mouth daily.   Taking  . fluticasone (FLONASE) 50 MCG/ACT nasal spray Place 1 spray into both nostrils 2 (two) times daily as needed for allergies or rhinitis. 16 g 0 Taking  . hydrochlorothiazide (HYDRODIURIL) 50 MG tablet Take 1 tablet (50 mg total) by mouth daily. 90 tablet 1 Taking  . Insulin Glargine (LANTUS SOLOSTAR) 100 UNIT/ML Solostar Pen Inject 10 Units into the skin daily at 10 pm. Increase by 2 units daily until fasting is 130 in am. (Patient taking differently: Inject 68 units at bed time.) 5 pen PRN Taking  . levothyroxine (SYNTHROID, LEVOTHROID) 25 MCG tablet TAKE 1 TABLET BY MOUTH EVERY MORNING 30 MINUTES BEFORE BREAKFAST 30 tablet 5   . metFORMIN (GLUCOPHAGE) 1000 MG tablet TAKE 1 TABLET BY MOUTH TWICE A DAY 180 tablet 1 Taking  . NOVOLOG FLEXPEN 100 UNIT/ML FlexPen Inject 20 units before breakfast and lunch, inject 22 units before supper. Increase when blood sugar is over 150. Use 2 units more for every 50 over.  9 Taking  . ramipril (ALTACE) 10 MG capsule TAKE ONE (1) CAPSULE BY MOUTH 2 TIMES DAILY 180 capsule 1 Taking  . ranitidine (ZANTAC) 150 MG tablet Take 150 mg by mouth 2 (two) times daily.   Taking   Allergies  Allergen Reactions  . Atorvastatin     Other reaction(s): Headache  . Other Other (See Comments)    STATIN DRUG-CAUSED MUSCLE PAIN/LETHARGIC   Past Medical History:  Diagnosis Date  . Arthritis    knee  . Bronchitis   . Colon polyp 2008  . Diabetes mellitus   . Dyspnea   . Hypercholesteremia   . Hypertension   .  Hypothyroidism    Past Surgical History:  Procedure Laterality Date  . COLONOSCOPY  2008, 2016   Dr Bary Castilla  . DERMATOFIBROMA  03/26/15   Procedure: Excision mass left leg;  Surgeon: Robert Bellow, MD;  Location: [facility] ORS;  Service: General;  Laterality: N/A;  . LIPOMA EXCISION N/A 03/26/2015      . TONSILLECTOMY     Social History   Socioeconomic History  . Marital status: Married    Spouse name: Not on file  . Number of children: Not on file  . Years of education: Not on file  . Highest education level: Not on file  Social Needs  . Financial resource strain: Not on file  . Food insecurity - worry: Not on file  . Food insecurity - inability: Not on file  . Transportation needs - medical: Not on file  . Transportation needs - non-medical: Not on file  Occupational History  . Not on file  Tobacco Use  . Smoking status: Former Smoker    Packs/day: 2.00    Years: 35.00    Pack years: 70.00    Types: Cigarettes, E-cigarettes    Last attempt to quit: 12/11/2014    Years since quitting: 2.8  . Smokeless tobacco: Never Used  Substance and Sexual Activity  . Alcohol use: No    Alcohol/week:  0.0 oz  . Drug use: No  . Sexual activity: Not on file  Other Topics Concern  . Not on file  Social History Narrative   Married.   3 children. 2 grandchildren.   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    Social History   Social History Narrative   Married.   3 children. 2 grandchildren.   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      ROS: Negative.     PE: HEENT: Negative. Lungs: Clear. Cardio: RR.    2016 Colonoscopy:  A. COLON POLYP, CECUM; COLD BIOPSY:  - TUBULAR ADENOMA.  - NEGATIVE FOR HIGH-GRADE DYSPLASIA AND MALIGNANCY.   B. COLON POLYP, TRANSVERSE; COLD BIOPSY:  - SESSILE SERRATED ADENOMA/POLYP, MULTIPLE FRAGMENTS.  - NEGATIVE FOR CYTOLOGIC DYSPLASIA AND  MALIGNANCY.  - CLINICAL CORRELATION IS ADVISED WITH REGARD TO COMPLETENESS OF  REMOVAL.   C. RECTUM POLYP; HOT SNARE:  - TUBULAR ADENOMA, 2 FRAGMENTS.  - NEGATIVE FOR HIGH-GRADE DYSPLASIA AND MALIGNANCY.    Assessment/Plan:  Proceed with planned endoscopy.  Forest Gleason [facility] 10/03/2017

## 2017-10-03 NOTE — Anesthesia Postprocedure Evaluation (Signed)
Anesthesia Post Note  Patient: [name]  Procedure(s) Performed: COLONOSCOPY WITH PROPOFOL (N/A )  Patient location during evaluation: Endoscopy Anesthesia Type: General Level of consciousness: awake and alert and oriented Pain management: pain level controlled Vital Signs Assessment: post-procedure vital signs reviewed and stable Respiratory status: spontaneous breathing, nonlabored ventilation and respiratory function stable Cardiovascular status: blood pressure returned to baseline and stable Postop Assessment: no signs of nausea or vomiting Anesthetic complications: no     Last Vitals:  Vitals:   10/03/17 0904 10/03/17 0924  BP: (!) 112/54 131/68  Pulse:    Resp:    Temp: (!) 36.1 C   SpO2:      Last Pain:  Vitals:   10/03/17 0904  TempSrc: Tympanic                 Shia Delaine

## 2017-10-03 NOTE — Transfer of Care (Signed)
Immediate Anesthesia Transfer of Care Note  Patient: [name]  Procedure(s) Performed: COLONOSCOPY WITH PROPOFOL (N/A )  Patient Location: PACU  Anesthesia Type:General  Level of Consciousness: awake, alert  and oriented  Airway & Oxygen Therapy: Patient Spontanous Breathing and Patient connected to nasal cannula oxygen  Post-op Assessment: Report given to RN and Post -op Vital signs reviewed and stable  Post vital signs: Reviewed and stable  Last Vitals:  Vitals:   10/03/17 0748  BP: (!) 153/84  Pulse: 82  Resp: 18  Temp: (!) 36.4 C  SpO2: 98%    Last Pain:  Vitals:   10/03/17 0748  TempSrc: Tympanic         Complications: No apparent anesthesia complications

## 2017-10-03 NOTE — Anesthesia Procedure Notes (Addendum)
Performed by: Demetrius Charity, CRNA Pre-anesthesia Checklist: Patient identified, Patient being monitored, Suction available, Emergency Drugs available and Timeout performed Patient Re-evaluated:Patient Re-evaluated prior to induction Oxygen Delivery Method: Nasal cannula Induction Type: IV induction Ventilation: Oral airway inserted - appropriate to patient size and Nasal airway inserted- appropriate to patient size Comments: Pt with bleeding from R nare after 6.0 nasal airway placed.  Suctioned.

## 2017-10-03 NOTE — Op Note (Signed)
[facility]astroenter[facility] Patient Name: [name] Procedure Date: 10/03/2017 8:11 AM MRN: [id number] Account #: [id number] Date of Birth: 1951-01-25 Admit Type: Outpatient Age: 67 Room: [facility] ENDO ROOM 1 Gender: Male Note Status: Finalized Procedure:            Colonoscopy Indications:          High risk colon cancer surveillance: Personal history                        of colonic polyps Providers:            Robert Bellow, MD Referring MD:         Pleas Koch (Referring MD) Medicines:            Monitored Anesthesia Care Complications:        No immediate complications. Procedure:            Pre-Anesthesia Assessment:                       - Prior to the procedure, a History and Physical was                        performed, and patient medications, allergies and                        sensitivities were reviewed. The patient's tolerance of                        previous anesthesia was reviewed.                       - The risks and benefits of the procedure and the                        sedation options and risks were discussed with the                        patient. All questions were answered and informed                        consent was obtained.                       After obtaining informed consent, the colonoscope was                        passed under direct vision. Throughout the procedure,                        the patient's blood pressure, pulse, and oxygen                        saturations were monitored continuously. The                        Colonoscope was introduced through the anus and                        advanced to the the terminal ileum. The colonoscopy was  performed without difficulty. The patient tolerated the                        procedure well. The quality of the bowel preparation                        was excellent. Findings:      Three sessile polyps were found in the mid transverse  colon. The polyps       were 5 to 10 mm in size. These polyps were removed with a hot snare.       Resection and retrieval were complete.      Two sessile polyps were found in the distal transverse colon. The polyps       were 5 mm in size. These were biopsied with a cold forceps for histology.      Two sessile polyps were found in the rectum. The polyps were 5 mm in       size. These were biopsied with a cold forceps for histology. Impression:           - Three 5 to 10 mm polyps in the mid transverse colon,                        removed with a hot snare. Resected and retrieved.                       - Two 5 mm polyps in the distal transverse colon.                        Biopsied.                       - Two 5 mm polyps in the rectum. Biopsied. Recommendation:       - Telephone endoscopist for pathology results in 1 week. Procedure Code(s):    --- Professional ---                       [phone], Colonoscopy, flexible; with removal of tumor(s),                        polyp(s), or other lesion(s) by snare technique                       [id number], 94, Colonoscopy, flexible; with biopsy, single                        or multiple Diagnosis Code(s):    --- Professional ---                       K62.1, Rectal polyp                       D12.3, Benign neoplasm of transverse colon (hepatic                        flexure or splenic flexure)                       Z86.010, Personal history of colonic polyps CPT copyright 2016 American [employer]. All rights reserved. The codes documented in this report are preliminary and upon  coder review may  be revised to meet current compliance requirements. Robert Bellow, MD 10/03/2017 9:01:09 AM This report has been signed electronically. Number of Addenda: 0 Note Initiated On: 10/03/2017 8:11 AM Scope Withdrawal Time: 0 hours 24 minutes 47 seconds  Total Procedure Duration: 0 hours 36 minutes 46 seconds       [facility]

## 2017-10-03 NOTE — Anesthesia Post-op Follow-up Note (Signed)
Anesthesia QCDR form completed.        

## 2017-10-03 NOTE — Anesthesia Preprocedure Evaluation (Signed)
Anesthesia Evaluation  Patient identified by MRN, date of birth, ID band Patient awake    Reviewed: Allergy & Precautions, NPO status , Patient's Chart, lab work & pertinent test results  History of Anesthesia Complications Negative for: history of anesthetic complications  Airway Mallampati: III  TM Distance: >3 FB Neck ROM: Full    Dental  (+) Edentulous Upper, Edentulous Lower   Pulmonary neg sleep apnea, COPD,  COPD inhaler, former smoker,    breath sounds clear to auscultation- rhonchi (-) wheezing      Cardiovascular hypertension, Pt. on medications (-) CAD, (-) Past MI, (-) Cardiac Stents and (-) CABG  Rhythm:Regular Rate:Normal - Systolic murmurs and - Diastolic murmurs    Neuro/Psych negative neurological ROS  negative psych ROS   GI/Hepatic Neg liver ROS, GERD  ,  Endo/Other  diabetes, Insulin DependentHypothyroidism   Renal/GU negative Renal ROS     Musculoskeletal  (+) Arthritis ,   Abdominal (+) + obese,   Peds  Hematology negative hematology ROS (+)   Anesthesia Other Findings Past Medical History: No date: Arthritis     Comment:  knee No date: Bronchitis 2008: Colon polyp No date: Diabetes mellitus No date: Dyspnea No date: Hypercholesteremia No date: Hypertension No date: Hypothyroidism   Reproductive/Obstetrics                             Anesthesia Physical Anesthesia Plan  ASA: III  Anesthesia Plan: General   Post-op Pain Management:    Induction: Intravenous  PONV Risk Score and Plan: 1 and Propofol infusion  Airway Management Planned: Natural Airway  Additional Equipment:   Intra-op Plan:   Post-operative Plan:   Informed Consent: I have reviewed the patients History and Physical, chart, labs and discussed the procedure including the risks, benefits and alternatives for the proposed anesthesia with the patient or authorized representative who has  indicated his/her understanding and acceptance.   Dental advisory given  Plan Discussed with: CRNA and Anesthesiologist  Anesthesia Plan Comments:         Anesthesia Quick Evaluation

## 2017-10-04 LAB — SURGICAL PATHOLOGY

## 2017-10-05 ENCOUNTER — Encounter: Payer: Self-pay | Admitting: General Surgery

## 2017-10-18 ENCOUNTER — Encounter: Payer: Self-pay | Admitting: Internal Medicine

## 2017-10-18 ENCOUNTER — Ambulatory Visit (INDEPENDENT_AMBULATORY_CARE_PROVIDER_SITE_OTHER): Payer: PPO | Admitting: Internal Medicine

## 2017-10-18 VITALS — BP 140/90 | HR 97 | Ht 70.0 in | Wt 303.0 lb

## 2017-10-18 DIAGNOSIS — G4719 Other hypersomnia: Secondary | ICD-10-CM

## 2017-10-18 DIAGNOSIS — J449 Chronic obstructive pulmonary disease, unspecified: Secondary | ICD-10-CM

## 2017-10-18 NOTE — Progress Notes (Signed)
[facility] Consultation      Assessment and Plan:  Excessive daytime sleepiness. -Symptoms and signs of obstructive sleep apnea. -We will send for sleep study and start CPAP as indicated.  COPD/emphysema. -Seen on low-dose CT scan. -Patient stop smoking, which explained this was the most important thing he could do for his breathing.  Currently most of his symptoms of dyspnea on exertion peer to be secondary predominantly to to obesity, deconditioning and inactivity.  We discussed we could start an inhaler in the future if needed. We will send for pulmonary function test.  Morbid obesity. -Discussed importance of weight loss for his overall health.  Dyspnea on exertion. -Multifactorial secondary to above.  Orders Placed This Encounter  Procedures  . Pulmonary Function Test [facility] Only  . Home sleep test   Return in about 3 months (around 01/17/2018).    Date: 10/18/2017  MRN# [id number] [name] 11/12/1950    [name] is a 67 y.o. old male seen in consultation for chief complaint of:    Chief Complaint  Patient presents with  . Consult    sleep issues: had colonoscopy and was informed he had apnea spells.   Referred by K. Clark for apnea.   HPI:   He recently went for a colonoscopy, he was noted to have apneic episodes with it. His wife is present and notes that he snores and sometimes stops breathing in his sleep. He has dentures and takes them out at night, she sometimes touches him to see if he is breathing. Daughter has OSA.  His weight has increased about 70 pounds over 2 years since an ankle injury.  He is undergoing lung cancer screening and was noted to have emphysema. He quit smoking in 2016. He notes that his breathing is ok.  Wife notes that he is much slower than he used to be. He goes to [employer] and can walk but will limited by pain.  He has albuterol but has not used it for about a year.   **Desat walk 10/18/17; baseline sat at  rest on RA; 96% and HR 87; walked 180 feet at slow limping pace with mild dypspnea. Sat was 92% ahd HR 105.  Images personally reviewed, Low dose CT chest 06/27/17  PMHX:   Past Medical History:  Diagnosis Date  . Arthritis    knee  . Bronchitis   . Colon polyp 2008  . Diabetes mellitus   . Dyspnea   . Hypercholesteremia   . Hypertension   . Hypothyroidism    Surgical Hx:  Past Surgical History:  Procedure Laterality Date  . COLONOSCOPY  2008, 2016   Dr Bary Castilla  . COLONOSCOPY WITH PROPOFOL N/A 10/03/2017   Procedure: COLONOSCOPY WITH PROPOFOL;  Surgeon: Robert Bellow, MD;  Location: [facility] ENDOSCOPY;  Service: Endoscopy;  Laterality: N/A;  . DERMATOFIBROMA  03/26/15   Procedure: Excision mass left leg;  Surgeon: Robert Bellow, MD;  Location: [facility] ORS;  Service: General;  Laterality: N/A;  . LIPOMA EXCISION N/A 03/26/2015      . TONSILLECTOMY     Family Hx:  Family History  Problem Relation Age of Onset  . Stroke Mother   . Diabetes Mother   . Stroke Father   . Diabetes Other   . Other Daughter 25       precancerous colon polyp   . Diabetes Maternal Grandmother   . Diabetes Paternal Grandmother    Social Hx:   Social History  Tobacco Use  . Smoking status: Former Smoker    Packs/day: 2.00    Years: 35.00    Pack years: 70.00    Types: Cigarettes, E-cigarettes    Last attempt to quit: 12/11/2014    Years since quitting: 2.8  . Smokeless tobacco: Never Used  Substance Use Topics  . Alcohol use: No    Alcohol/week: 0.0 oz  . Drug use: No   Medication:    Current Outpatient Medications:  .  albuterol (PROVENTIL HFA;VENTOLIN HFA) 108 (90 Base) MCG/ACT inhaler, Inhale 2 puffs into the lungs every 4 (four) hours as needed for wheezing or shortness of breath., Disp: 1 Inhaler, Rfl: 0 .  aspirin EC 81 MG tablet, Take 81 mg by mouth daily., Disp: , Rfl:  .  fenofibrate (TRICOR) 145 MG tablet, Take 145 mg by mouth daily., Disp: , Rfl:  .  fluticasone (FLONASE) 50  MCG/ACT nasal spray, Place 1 spray into both nostrils 2 (two) times daily as needed for allergies or rhinitis., Disp: 16 g, Rfl: 0 .  hydrochlorothiazide (HYDRODIURIL) 50 MG tablet, Take 1 tablet (50 mg total) by mouth daily., Disp: 90 tablet, Rfl: 1 .  Insulin Glargine (LANTUS SOLOSTAR) 100 UNIT/ML Solostar Pen, Inject 10 Units into the skin daily at 10 pm. Increase by 2 units daily until fasting is 130 in am. (Patient taking differently: Inject 68 units at bed time.), Disp: 5 pen, Rfl: PRN .  levothyroxine (SYNTHROID, LEVOTHROID) 25 MCG tablet, TAKE 1 TABLET BY MOUTH EVERY MORNING 30 MINUTES BEFORE BREAKFAST, Disp: 30 tablet, Rfl: 5 .  metFORMIN (GLUCOPHAGE) 1000 MG tablet, TAKE 1 TABLET BY MOUTH TWICE A DAY, Disp: 180 tablet, Rfl: 1 .  NOVOLOG FLEXPEN 100 UNIT/ML FlexPen, Inject 20 units before breakfast and lunch, inject 22 units before supper. Increase when blood sugar is over 150. Use 2 units more for every 50 over., Disp: , Rfl: 9 .  ramipril (ALTACE) 10 MG capsule, TAKE ONE (1) CAPSULE BY MOUTH 2 TIMES DAILY, Disp: 180 capsule, Rfl: 1 .  ranitidine (ZANTAC) 150 MG tablet, Take 150 mg by mouth 2 (two) times daily., Disp: , Rfl:    Allergies:  Atorvastatin and Other  Review of Systems: Gen:  Denies  fever, sweats, chills HEENT: Denies blurred vision, double vision. bleeds, sore throat Cvc:  No dizziness, chest pain. Resp:   Denies cough or sputum production. Gi: Denies swallowing difficulty, stomach pain. Gu:  Denies bladder incontinence, burning urine Ext:   No Joint pain, stiffness. Skin: No skin rash,  hives  Endoc:  No polyuria, polydipsia. Psych: No depression, insomnia. Other:  All other systems were reviewed with the patient and were negative other that what is mentioned in the HPI.   Physical Examination:   VS: BP 140/90 (BP Location: Left Arm, Cuff Size: Large)   Pulse 97   Ht 5\' 10"  (1.778 m)   Wt (!) 303 lb (137.4 kg)   SpO2 94%   BMI 43.48 kg/m   General  Appearance: No distress morbid obesity. Neuro:without focal findings,  speech normal,  HEENT: PERRLA, EOM intact.   Pulmonary: normal breath sounds, No wheezing.  CardiovascularNormal S1,S2.  No m/r/g.   Abdomen: Benign, Soft, non-tender. Renal:  No costovertebral tenderness  GU:  No performed at this time. Endoc: No evident thyromegaly, no signs of acromegaly. Skin:   warm, no rashes, no ecchymosis  Extremities: normal, no cyanosis, clubbing.  Other findings:    LABORATORY PANEL:   CBC No results for input(s):  WBC, HGB, HCT, PLT in the last 168 hours. ------------------------------------------------------------------------------------------------------------------  Chemistries  No results for input(s): NA, K, CL, CO2, GLUCOSE, BUN, CREATININE, CALCIUM, MG, AST, ALT, ALKPHOS, BILITOT in the last 168 hours.  Invalid input(s): GFRCGP ------------------------------------------------------------------------------------------------------------------  Cardiac Enzymes No results for input(s): TROPONINI in the last 168 hours. ------------------------------------------------------------  RADIOLOGY:  No results found.     Thank  you for the consultation and for allowing [facility] to assist in the care of your patient. Our recommendations are noted above.  Please contact us if we can be of further service.   Marda Stalker, MD.  Board Certified in Internal Medicine, Pulmonary Medicine, [place], and Sleep Medicine.  [facility] Office Number: [phone]  Patricia Pesa, M.D.  Merton Border, M.D  10/18/2017

## 2017-10-18 NOTE — Patient Instructions (Addendum)
Will send for Sleep study.  Will send for pulmonary function test.  Weight loss will help your breathing.

## 2017-11-05 ENCOUNTER — Other Ambulatory Visit: Payer: Self-pay | Admitting: Primary Care

## 2017-11-05 DIAGNOSIS — I10 Essential (primary) hypertension: Secondary | ICD-10-CM

## 2017-11-08 ENCOUNTER — Ambulatory Visit (INDEPENDENT_AMBULATORY_CARE_PROVIDER_SITE_OTHER): Payer: PPO | Admitting: Family Medicine

## 2017-11-08 ENCOUNTER — Other Ambulatory Visit: Payer: Self-pay

## 2017-11-08 ENCOUNTER — Encounter: Payer: Self-pay | Admitting: *Deleted

## 2017-11-08 VITALS — BP 120/76 | HR 83 | Temp 98.0°F | Ht 70.0 in | Wt 306.2 lb

## 2017-11-08 DIAGNOSIS — M1712 Unilateral primary osteoarthritis, left knee: Secondary | ICD-10-CM | POA: Diagnosis not present

## 2017-11-08 MED ORDER — METHYLPREDNISOLONE ACETATE 40 MG/ML IJ SUSP
80.0000 mg | Freq: Once | INTRAMUSCULAR | Status: AC
  Administered 2017-11-08: 80 mg via INTRA_ARTICULAR

## 2017-11-08 NOTE — Progress Notes (Signed)
Dr. Frederico Hamman T. Angelena Sand, MD, [facility] [place], [address] Phone: [phone] Fax: [phone]  11/08/2017  Patient: [name], MRN: [id number], DOB: July 20, 1950, 67 y.o.  Primary Physician:  Pleas Koch, NP   Chief Complaint  Patient presents with  . Knee Pain    Bilateral but left is worse   Subjective:   [name] is a 67 y.o. very pleasant male patient who presents with the following:  About 2015, tried to push a 55 gallon drum on a pallet, felt a pop on the left side. Went to [facility]. Went to PT for about five or six months, and went to PT. Went to an MD in [place].  He has known osteoarthritis, and on the left side he had a corticosteroid injection previously that lasted greater than 6 months of relief.  Had some Euflexxa injection. This did not help at all..  Taking some Lantus now.   L knee injection  Past Medical History, Surgical History, Social History, Family History, Problem List, Medications, and Allergies have been reviewed and updated if relevant.  Patient Active Problem List   Diagnosis Date Noted  . Welcome to Medicare preventive visit 09/25/2017  . Personal history of tobacco use, presenting hazards to health 06/30/2016  . Preventative health care 06/05/2016  . Osteoarthritis of both knees 11/09/2015  . Diabetes mellitus ([place]) 06/14/2015  . Allergic rhinitis 05/28/2015  . Adult BMI 30+ 05/28/2015  . Chronic constipation 05/28/2015  . Chronic obstructive pulmonary disease ([place]) 05/28/2015  . Essential (primary) hypertension 05/28/2015  . Hypercholesterolemia 05/28/2015  . Gastro-esophageal reflux disease without esophagitis 05/28/2015  . Subclinical hypothyroidism 05/28/2015  . Dermatofibroma of left lower leg 04/01/2015  . Former smoker 03/04/2015  . History of colonic polyps 08/20/2014  . Idiopathic localized osteoarthropathy 03/16/2014    Past Medical History:    Diagnosis Date  . Arthritis    knee  . Bronchitis   . Colon polyp 2008  . Diabetes mellitus   . Dyspnea   . Hypercholesteremia   . Hypertension   . Hypothyroidism     Past Surgical History:  Procedure Laterality Date  . COLONOSCOPY  2008, 2016   Dr Bary Castilla  . COLONOSCOPY WITH PROPOFOL N/A 10/03/2017   Procedure: COLONOSCOPY WITH PROPOFOL;  Surgeon: Robert Bellow, MD;  Location: [facility] ENDOSCOPY;  Service: Endoscopy;  Laterality: N/A;  . DERMATOFIBROMA  03/26/15   Procedure: Excision mass left leg;  Surgeon: Robert Bellow, MD;  Location: [facility] ORS;  Service: General;  Laterality: N/A;  . LIPOMA EXCISION N/A 03/26/2015      . TONSILLECTOMY      Social History   Socioeconomic History  . Marital status: Married    Spouse name: Not on file  . Number of children: Not on file  . Years of education: Not on file  . Highest education level: Not on file  Occupational History  . Not on file  Social Needs  . Financial resource strain: Not on file  . Food insecurity:    Worry: Not on file    Inability: Not on file  . Transportation needs:    Medical: Not on file    Non-medical: Not on file  Tobacco Use  . Smoking status: Former Smoker    Packs/day: 2.00    Years: 35.00    Pack years: 70.00    Types: Cigarettes, E-cigarettes    Last attempt to quit:  12/11/2014    Years since quitting: 2.9  . Smokeless tobacco: Never Used  Substance and Sexual Activity  . Alcohol use: No    Alcohol/week: 0.0 oz  . Drug use: No  . Sexual activity: Not on file  Lifestyle  . Physical activity:    Days per week: Not on file    Minutes per session: Not on file  . Stress: Not on file  Relationships  . Social connections:    Talks on phone: Not on file    Gets together: Not on file    Attends religious service: Not on file    Active member of club or organization: Not on file    Attends meetings of clubs or organizations: Not on file    Relationship status: Not on file  . Intimate  partner violence:    Fear of current or ex partner: Not on file    Emotionally abused: Not on file    Physically abused: Not on file    Forced sexual activity: Not on file  Other Topics Concern  . Not on file  Social History Narrative   Married.   3 children. 2 grandchildren.   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     Family History  Problem Relation Age of Onset  . Stroke Mother   . Diabetes Mother   . Stroke Father   . Diabetes Other   . Other Daughter 25       precancerous colon polyp   . Diabetes Maternal Grandmother   . Diabetes Paternal Grandmother     Allergies  Allergen Reactions  . Atorvastatin     Other reaction(s): Headache  . Other Other (See Comments)    STATIN DRUG-CAUSED MUSCLE PAIN/LETHARGIC    Medication list reviewed and updated in full in [place].  GEN: No fevers, chills. Nontoxic. Primarily MSK c/o today. MSK: Detailed in the HPI GI: tolerating PO intake without difficulty Neuro: No numbness, parasthesias, or tingling associated. Otherwise the pertinent positives of the ROS are noted above.   Objective:   BP 120/76   Pulse 83   Temp 98 F (36.7 C) (Oral)   Ht 5\' 10"  (1.778 m)   Wt (!) 306 lb 4 oz (138.9 kg)   BMI 43.94 kg/m    GEN: WDWN, NAD, Non-toxic, Alert & Oriented x 3 HEENT: Atraumatic, Normocephalic.  Ears and Nose: No external deformity. EXTR: No clubbing/cyanosis/edema NEURO: Normal gait.  PSYCH: Normally interactive. Conversant. Not depressed or anxious appearing.  Calm demeanor.    Left knee: Patient lacks 2 degrees of extension and he has 95 degrees of flexion.  Minimal effusion.  Stable to varus and valgus stress.  ACL and PCL are also intact.  He has pain with flexion pinch as well as McMurray's but no mechanical symptoms.  Radiology: No results found.   Assessment and Plan:   Primary osteoarthritis of left knee - Plan: methylPREDNISolone acetate  (DEPO-MEDROL) injection 80 mg  Reviewed basic arthritis management I think at this point I would be comfortable injecting the left knee only, and I would hold on the right knee for a time when it is bothering him more.  Knee Injection, L Patient verbally consented to procedure. Risks (including potential rare risk of infection), benefits, and alternatives explained. Sterilely prepped with Chloraprep. Ethyl cholride used for anesthesia. 8 cc Lidocaine 1% mixed with 2 mL Depo-Medrol 40 mg injected using the anteromedial approach  without difficulty. No complications with procedure and tolerated well. Patient had decreased pain post-injection.   Follow-up: No follow-ups on file.  Meds ordered this encounter  Medications  . methylPREDNISolone acetate (DEPO-MEDROL) injection 80 mg   Signed,  Anushree Dorsi T. Coty Larsh, MD   Allergies as of 11/08/2017      Reactions   Atorvastatin    Other reaction(s): Headache   Other Other (See Comments)   STATIN DRUG-CAUSED MUSCLE PAIN/LETHARGIC      Medication List        Accurate as of 11/08/17 11:59 PM. Always use your most recent med list.          albuterol 108 (90 Base) MCG/ACT inhaler Commonly known as:  PROVENTIL HFA;VENTOLIN HFA Inhale 2 puffs into the lungs every 4 (four) hours as needed for wheezing or shortness of breath.   aspirin EC 81 MG tablet Take 81 mg by mouth daily.   fenofibrate 145 MG tablet Commonly known as:  TRICOR Take 145 mg by mouth daily.   fluticasone 50 MCG/ACT nasal spray Commonly known as:  FLONASE Place 1 spray into both nostrils 2 (two) times daily as needed for allergies or rhinitis.   hydrochlorothiazide 50 MG tablet Commonly known as:  HYDRODIURIL Take 1 tablet (50 mg total) by mouth daily.   LANTUS SOLOSTAR 100 UNIT/ML Solostar Pen Generic drug:  Insulin Glargine Inject 68 Units into the skin daily at 10 pm.   levothyroxine 25 MCG tablet Commonly known as:  SYNTHROID, LEVOTHROID TAKE 1 TABLET BY  MOUTH EVERY MORNING 30 MINUTES BEFORE BREAKFAST   metFORMIN 1000 MG tablet Commonly known as:  GLUCOPHAGE TAKE 1 TABLET BY MOUTH TWICE A DAY   NOVOLOG FLEXPEN 100 UNIT/ML FlexPen Generic drug:  insulin aspart Inject 20 units before breakfast and lunch, inject 22 units before supper. Increase when blood sugar is over 150. Use 2 units more for every 50 over.   ramipril 10 MG capsule Commonly known as:  ALTACE TAKE 1 CAPSULE BY MOUTH 2 TIMES DAILY   ranitidine 150 MG tablet Commonly known as:  ZANTAC Take 150 mg by mouth 2 (two) times daily.

## 2017-11-09 ENCOUNTER — Encounter: Payer: Self-pay | Admitting: Family Medicine

## 2017-11-14 DIAGNOSIS — G4733 Obstructive sleep apnea (adult) (pediatric): Secondary | ICD-10-CM

## 2017-11-15 ENCOUNTER — Telehealth: Payer: Self-pay | Admitting: *Deleted

## 2017-11-15 DIAGNOSIS — G4733 Obstructive sleep apnea (adult) (pediatric): Secondary | ICD-10-CM | POA: Diagnosis not present

## 2017-11-15 NOTE — Telephone Encounter (Signed)
LMTCB. HST positive OSA Recommendation: Auto-cpap pressure range of 5-20 cm H20 Repeat ONO on cpap.

## 2017-11-16 ENCOUNTER — Ambulatory Visit (INDEPENDENT_AMBULATORY_CARE_PROVIDER_SITE_OTHER): Payer: PPO | Admitting: Podiatry

## 2017-11-16 DIAGNOSIS — M79676 Pain in unspecified toe(s): Secondary | ICD-10-CM | POA: Diagnosis not present

## 2017-11-16 DIAGNOSIS — B351 Tinea unguium: Secondary | ICD-10-CM

## 2017-11-16 DIAGNOSIS — E1149 Type 2 diabetes mellitus with other diabetic neurological complication: Secondary | ICD-10-CM

## 2017-11-16 DIAGNOSIS — Q828 Other specified congenital malformations of skin: Secondary | ICD-10-CM | POA: Diagnosis not present

## 2017-11-16 NOTE — Telephone Encounter (Signed)
LMTCB for results. 

## 2017-11-16 NOTE — Telephone Encounter (Signed)
Pt informed of results and to call us when he receives the CPAP so we can order the ONO on CPAP. Nothing further needed.

## 2017-11-16 NOTE — Progress Notes (Signed)
Subjective: 67 y.o. returns the office today for painful, elongated, thickened toenails which he cannot trim himself and for calluses. Denies any redness or drainage around the nails/callus. Denies any acute changes since last appointment and no new complaints today. Denies any systemic complaints such as fevers, chills, nausea, vomiting.   PCP: Pleas Koch, NP Last Seen: 10/03/2017  A1c: 7.4  Objective: AAO 3, NAD DP/PT pulses palpable, CRT less than 3 seconds Nails hypertrophic, dystrophic, elongated, brittle, discolored 10. There is tenderness overlying the nails 1-5 bilaterally. There is no surrounding erythema or drainage along the nail sites. Hyperkeratotic lesions right sub 2/4. No underlying ulceration, drainage, signs of infection.  No open lesions or pre-ulcerative lesions are identified. No other areas of tenderness bilateral lower extremities. No overlying edema, erythema, increased warmth. No pain with calf compression, swelling, warmth, erythema.  Assessment: Patient presents with symptomatic onychomycosis  Plan: -Treatment options including alternatives, risks, complications were discussed -Nails sharply debrided 10 without complication/bleeding. -Hyperkeratotic lesions sharply debrided x 2 without complications or bleeding.  -Discussed daily foot inspection. If there are any changes, to call the office immediately.  -Follow-up in 3 months or sooner if any problems are to arise. In the meantime, encouraged to call the office with any questions, concerns, changes symptoms.  Celesta Gentile, DPM

## 2017-11-22 ENCOUNTER — Ambulatory Visit (INDEPENDENT_AMBULATORY_CARE_PROVIDER_SITE_OTHER): Payer: PPO | Admitting: Internal Medicine

## 2017-11-22 VITALS — BP 128/78 | HR 87 | Temp 98.7°F | Wt 302.0 lb

## 2017-11-22 DIAGNOSIS — J0110 Acute frontal sinusitis, unspecified: Secondary | ICD-10-CM | POA: Diagnosis not present

## 2017-11-22 DIAGNOSIS — E119 Type 2 diabetes mellitus without complications: Secondary | ICD-10-CM

## 2017-11-22 DIAGNOSIS — J449 Chronic obstructive pulmonary disease, unspecified: Secondary | ICD-10-CM

## 2017-11-22 MED ORDER — AMOXICILLIN-POT CLAVULANATE 875-125 MG PO TABS: 1.0000 | ORAL_TABLET | Freq: Two times a day (BID) | ORAL | 0 refills | Status: DC

## 2017-11-22 NOTE — Patient Instructions (Signed)

## 2017-11-22 NOTE — Progress Notes (Signed)
HPI   Pt presents to the clinic today with c/o headache, nasal congestion and cough. He reports this started 1 week ago. The headache is located in the forehead. He describes the pain as pressure. He is blowing green mucous out of his nose. The cough is productive of yellow/green mucous. He denies fever, chills or body aches. He has tried Coricidin and Tussin with minimal relief. He has a history of COPD and DM 2. Pneumococcal vaccines UTD.  Review of Systems     Past Medical History:  Diagnosis Date  . Arthritis    knee  . Bronchitis   . Colon polyp 2008  . Diabetes mellitus   . Dyspnea   . Hypercholesteremia   . Hypertension   . Hypothyroidism     Family History  Problem Relation Age of Onset  . Stroke Mother   . Diabetes Mother   . Stroke Father   . Diabetes Other   . Other Daughter 25       precancerous colon polyp   . Diabetes Maternal Grandmother   . Diabetes Paternal Grandmother     Social History   Socioeconomic History  . Marital status: Married    Spouse name: Not on file  . Number of children: Not on file  . Years of education: Not on file  . Highest education level: Not on file  Occupational History  . Not on file  Social Needs  . Financial resource strain: Not on file  . Food insecurity:    Worry: Not on file    Inability: Not on file  . Transportation needs:    Medical: Not on file    Non-medical: Not on file  Tobacco Use  . Smoking status: Former Smoker    Packs/day: 2.00    Years: 35.00    Pack years: 70.00    Types: Cigarettes, E-cigarettes    Last attempt to quit: 12/11/2014    Years since quitting: 2.9  . Smokeless tobacco: Never Used  Substance and Sexual Activity  . Alcohol use: No    Alcohol/week: 0.0 oz  . Drug use: No  . Sexual activity: Not on file  Lifestyle  . Physical activity:    Days per week: Not on file    Minutes per session: Not on file  . Stress: Not on file  Relationships  . Social connections:    Talks on  phone: Not on file    Gets together: Not on file    Attends religious service: Not on file    Active member of club or organization: Not on file    Attends meetings of clubs or organizations: Not on file    Relationship status: Not on file  . Intimate partner violence:    Fear of current or ex partner: Not on file    Emotionally abused: Not on file    Physically abused: Not on file    Forced sexual activity: Not on file  Other Topics Concern  . Not on file  Social History Narrative   Married.   3 children. 2 grandchildren.   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     Allergies  Allergen Reactions  . Atorvastatin     Other reaction(s): Headache  . Other Other (See Comments)    STATIN DRUG-CAUSED MUSCLE PAIN/LETHARGIC     Constitutional: Positive headache. Denies fatigue, fever or abrupt weight changes.  HEENT:  Positive nasal congestion .  Denies eye redness, ear pain, ringing in the ears, wax buildup, runny nose or sore throat. Respiratory: Positive cough. Denies difficulty breathing or shortness of breath.  Cardiovascular: Denies chest pain, chest tightness, palpitations or swelling in the hands or feet.   No other specific complaints in a complete review of systems (except as listed in HPI above).  Objective:   BP 128/78   Pulse 87   Temp 98.7 F (37.1 C) (Oral)   Wt (!) 302 lb (137 kg)   SpO2 98%   BMI 43.33 kg/m   General: Appears his stated age, in NAD. HEENT: Head: normal shape and size, frontal sinus tenderness noted; Ears: Tm's gray and intact, normal light reflex; Nose: mucosa boggy and moist, turbinates swollen; Throat/Mouth: + PND. Teeth present, mucosa erythematous and moist, no exudate noted, no lesions or ulcerations noted.  Neck:  No adenopathy noted.  Pulmonary/Chest: Normal effort and positive vesicular breath sounds. No respiratory distress. No wheezes, rales or ronchi noted.       Assessment &  Plan:   Acute Frontal Sinusitis  Can use a Neti Pot which can be purchased from your local drug store. Flonase 2 sprays each nostril for 3 days and then as needed. eRx for Augmentin BID for 10 days  RTC as needed or if symptoms persist. Webb Silversmith, NP

## 2017-11-23 ENCOUNTER — Encounter: Payer: Self-pay | Admitting: Internal Medicine

## 2017-12-04 DIAGNOSIS — G4733 Obstructive sleep apnea (adult) (pediatric): Secondary | ICD-10-CM | POA: Diagnosis not present

## 2017-12-21 DIAGNOSIS — E1165 Type 2 diabetes mellitus with hyperglycemia: Secondary | ICD-10-CM | POA: Diagnosis not present

## 2017-12-28 DIAGNOSIS — E1142 Type 2 diabetes mellitus with diabetic polyneuropathy: Secondary | ICD-10-CM | POA: Diagnosis not present

## 2017-12-28 DIAGNOSIS — Z794 Long term (current) use of insulin: Secondary | ICD-10-CM | POA: Diagnosis not present

## 2017-12-31 ENCOUNTER — Encounter: Payer: Self-pay | Admitting: Internal Medicine

## 2017-12-31 DIAGNOSIS — R0902 Hypoxemia: Secondary | ICD-10-CM | POA: Diagnosis not present

## 2017-12-31 DIAGNOSIS — J449 Chronic obstructive pulmonary disease, unspecified: Secondary | ICD-10-CM | POA: Diagnosis not present

## 2018-01-03 ENCOUNTER — Encounter: Payer: Self-pay | Admitting: Internal Medicine

## 2018-01-04 DIAGNOSIS — G4733 Obstructive sleep apnea (adult) (pediatric): Secondary | ICD-10-CM | POA: Diagnosis not present

## 2018-01-08 ENCOUNTER — Ambulatory Visit: Payer: PPO | Attending: Internal Medicine

## 2018-01-08 DIAGNOSIS — J449 Chronic obstructive pulmonary disease, unspecified: Secondary | ICD-10-CM

## 2018-01-08 DIAGNOSIS — G4710 Hypersomnia, unspecified: Secondary | ICD-10-CM | POA: Insufficient documentation

## 2018-01-08 DIAGNOSIS — G4719 Other hypersomnia: Secondary | ICD-10-CM

## 2018-01-08 MED ORDER — ALBUTEROL SULFATE (2.5 MG/3ML) 0.083% IN NEBU
2.5000 mg | INHALATION_SOLUTION | Freq: Once | RESPIRATORY_TRACT | Status: AC
  Administered 2018-01-08: 2.5 mg via RESPIRATORY_TRACT
  Filled 2018-01-08: qty 3

## 2018-01-09 ENCOUNTER — Telehealth: Payer: Self-pay | Admitting: *Deleted

## 2018-01-09 DIAGNOSIS — J449 Chronic obstructive pulmonary disease, unspecified: Secondary | ICD-10-CM

## 2018-01-09 NOTE — Telephone Encounter (Signed)
Left message to call.ss

## 2018-01-10 DIAGNOSIS — G4733 Obstructive sleep apnea (adult) (pediatric): Secondary | ICD-10-CM | POA: Diagnosis not present

## 2018-01-10 NOTE — Telephone Encounter (Signed)
Patient aware Orders placed Nothing further needed. 

## 2018-01-10 NOTE — Telephone Encounter (Signed)
Pt is returning your call

## 2018-01-11 DIAGNOSIS — R0902 Hypoxemia: Secondary | ICD-10-CM | POA: Diagnosis not present

## 2018-01-11 DIAGNOSIS — G4733 Obstructive sleep apnea (adult) (pediatric): Secondary | ICD-10-CM | POA: Diagnosis not present

## 2018-01-11 DIAGNOSIS — J449 Chronic obstructive pulmonary disease, unspecified: Secondary | ICD-10-CM | POA: Diagnosis not present

## 2018-01-17 ENCOUNTER — Encounter: Payer: Self-pay | Admitting: Internal Medicine

## 2018-01-20 ENCOUNTER — Encounter: Payer: Self-pay | Admitting: Internal Medicine

## 2018-01-21 NOTE — Progress Notes (Signed)
[facility]     Assessment and Plan:  Obstructive sleep apnea, severe with AHI of 50. -Will increase pressure range of 10-20 cm H2O,  COPD/emphysema. -Seen on low-dose CT scan and PFT. -Dyspnea on exertion multifactorial from COPD, obesity, deconditioning.   Morbid obesity. -Discussed importance of weight loss for his overall health.  Dyspnea on exertion. -Multifactorial secondary to above.   Return in about 6 months (around 07/25/2018).    Date: 01/21/2018  MRN# [id number] [name] [birth year]    [name] is a 67 y.o. old male seen in consultation for chief complaint of:    Chief Complaint  Patient presents with  . Sleep Apnea    pt here for compliance visit with results of PFT. Pt states cpap therapy going well.Marland Kitchen    HPI:   Patient was last seen for signs symptoms of obstructive sleep apnea, as well as dyspnea on exertion, COPD.  It was thought that his dyspnea was multifactorial from COPD as well as deconditioning, obesity.  He underwent pulmonary function testing which confirmed moderate obstructive lung disease and mild restriction secondary to body habitus.  He has a rescue inhaler but uses it rarely. He is using CPAP every night, and feels that he is doing better with it. He is more awake since he has been using the CPAP. He still take an hour nap about 3 days per week.   **Download data 12/22/2017-01/20/2018>> data personally reviewed, uses greater than 4 hours is 29/30 days.  Average usage on days used is 6 hours 46 minutes.  Auto set 5-20.  Median pressure 10, 95th percentile pressure 15, maximum pressure 16.  Residual AHI 7.2.  Overall this shows very good compliance with CPAP with adequate control of obstructive sleep apnea. **PFT 01/08/2018>> patient's personally reviewed FVC is 69% predicted, FEV1 is 58% predicted, ratio 60%.  There is no significant improvement with bronchodilator therapy.  TLC is 78% predicted, ratio is normal.  Diffusion  capacity severely reduced at 44%.  Flow volume loop suggestive of obstruction.  Overall this test is consistent with moderate obstructive lung disease without significant air trapping.  There is mild restriction likely secondary to body habitus. **HST 11/14/2017>> severe obstructive sleep apnea with AHI of 50.  Recommended auto CPAP with pressure range of 5-20 cmH2O. **Desat walk 10/18/17; baseline sat at rest on RA; 96% and HR 87; walked 180 feet at slow limping pace with mild dypspnea. Sat was 92% ahd HR 105.  **Low dose CT chest 06/27/17; emphysematous changes.  Social Hx:   Social History   Tobacco Use  . Smoking status: Former Smoker    Packs/day: 2.00    Years: 35.00    Pack years: 70.00    Types: Cigarettes, E-cigarettes    Last attempt to quit: 12/11/2014    Years since quitting: 3.1  . Smokeless tobacco: Never Used  Substance Use Topics  . Alcohol use: No    Alcohol/week: 0.0 oz  . Drug use: No   Medication:    Current Outpatient Medications:  .  albuterol (PROVENTIL HFA;VENTOLIN HFA) 108 (90 Base) MCG/ACT inhaler, Inhale 2 puffs into the lungs every 4 (four) hours as needed for wheezing or shortness of breath., Disp: 1 Inhaler, Rfl: 0 .  amoxicillin-clavulanate (AUGMENTIN) 875-125 MG tablet, Take 1 tablet by mouth 2 (two) times daily., Disp: 20 tablet, Rfl: 0 .  aspirin EC 81 MG tablet, Take 81 mg by mouth daily., Disp: , Rfl:  .  fenofibrate (TRICOR)  145 MG tablet, Take 145 mg by mouth daily., Disp: , Rfl:  .  fluticasone (FLONASE) 50 MCG/ACT nasal spray, Place 1 spray into both nostrils 2 (two) times daily as needed for allergies or rhinitis., Disp: 16 g, Rfl: 0 .  hydrochlorothiazide (HYDRODIURIL) 50 MG tablet, Take 1 tablet (50 mg total) by mouth daily., Disp: 90 tablet, Rfl: 1 .  Insulin Glargine (LANTUS SOLOSTAR) 100 UNIT/ML Solostar Pen, Inject 68 Units into the skin daily at 10 pm., Disp: , Rfl:  .  levothyroxine (SYNTHROID, LEVOTHROID) 25 MCG tablet, TAKE 1 TABLET BY  MOUTH EVERY MORNING 30 MINUTES BEFORE BREAKFAST, Disp: 30 tablet, Rfl: 5 .  metFORMIN (GLUCOPHAGE) 1000 MG tablet, TAKE 1 TABLET BY MOUTH TWICE A DAY, Disp: 180 tablet, Rfl: 1 .  NOVOLOG FLEXPEN 100 UNIT/ML FlexPen, Inject 20 units before breakfast and lunch, inject 22 units before supper. Increase when blood sugar is over 150. Use 2 units more for every 50 over., Disp: , Rfl: 9 .  ramipril (ALTACE) 10 MG capsule, TAKE 1 CAPSULE BY MOUTH 2 TIMES DAILY, Disp: 180 capsule, Rfl: 1 .  ranitidine (ZANTAC) 150 MG tablet, Take 150 mg by mouth 2 (two) times daily., Disp: , Rfl:    Allergies:  Atorvastatin and Other  Review of Systems:  Constitutional: Feels well. Cardiovascular: No chest pain.  Pulmonary: Denies dyspnea.   The remainder of systems were reviewed and were found to be negative other than what is documented in the HPI.    Physical Examination:   VS: BP (!) 142/76   Resp 16   Ht 5\' 10"  (1.778 m)   Wt (!) 309 lb (140.2 kg)   BMI 44.34 kg/m   General Appearance: No distress  Neuro:without focal findings, mental status, speech normal, alert and oriented HEENT: PERRLA, EOM intact Pulmonary: No wheezing, No rales  CardiovascularNormal S1,S2.  No m/r/g.  Abdomen: Benign, Soft, non-tender, No masses Renal:  No costovertebral tenderness  GU:  No performed at this time. Endoc: No evident thyromegaly, no signs of acromegaly or Cushing features Skin:   warm, no rashes, no ecchymosis  Extremities: normal, no cyanosis, clubbing.       LABORATORY PANEL:   CBC No results for input(s): WBC, HGB, HCT, PLT in the last 168 hours. ------------------------------------------------------------------------------------------------------------------  Chemistries  No results for input(s): NA, K, CL, CO2, GLUCOSE, BUN, CREATININE, CALCIUM, MG, AST, ALT, ALKPHOS, BILITOT in the last 168 hours.  Invalid input(s):  GFRCGP ------------------------------------------------------------------------------------------------------------------  Cardiac Enzymes No results for input(s): TROPONINI in the last 168 hours. ------------------------------------------------------------  RADIOLOGY:  No results found.     Thank  you for the consultation and for allowing [facility] to assist in the care of your patient. Our recommendations are noted above.  Please contact us if we can be of further service.  Marda Stalker, M.D., F.C.C.P.  Board Certified in Internal Medicine, Pulmonary Medicine, [place], and Sleep Medicine.  [facility] Office Number: [phone]  01/21/2018

## 2018-01-22 ENCOUNTER — Ambulatory Visit: Payer: PPO | Admitting: Internal Medicine

## 2018-01-22 ENCOUNTER — Encounter: Payer: Self-pay | Admitting: Internal Medicine

## 2018-01-22 VITALS — BP 142/76 | Resp 16 | Ht 70.0 in | Wt 309.0 lb

## 2018-01-22 DIAGNOSIS — J449 Chronic obstructive pulmonary disease, unspecified: Secondary | ICD-10-CM

## 2018-01-22 DIAGNOSIS — G4733 Obstructive sleep apnea (adult) (pediatric): Secondary | ICD-10-CM | POA: Diagnosis not present

## 2018-01-22 NOTE — Patient Instructions (Addendum)
Will increase range to 10-20 cm H2O.  Use your rescue inhaler as needed.  Try to increase your activity level.

## 2018-01-22 NOTE — Addendum Note (Signed)
Addended by: Stephanie Coup on: 01/22/2018 10:59 AM   Modules accepted: Orders

## 2018-01-30 ENCOUNTER — Other Ambulatory Visit: Payer: Self-pay | Admitting: *Deleted

## 2018-01-30 NOTE — Patient Outreach (Signed)
[facility]) Care Management  01/30/2018  [name] 13-Mar-1951 [id number]   Telephone Screen  Referral Date: 01/23/18 Referral Source: HTA referral Referral Reason: member request resources while in coverage gap Insurance:HTA   Outreach attempt # 1 successful at listed home number in [facility] Patient is able to verify HIPAA Reviewed and addressed referral to [facility] with patient He confirms his coverage medication concerns are for Lantus and Novolog  In last 2 months he has been put on CPAP for OSA and received the items in the home on January 18 2018   Social: [name] is a disabled and living in his home with his wife [name] with good support from his son who lives locally, [name] (has 2 other children) He is independent in ADLs an iADLs He has 22 acres of land and 2 houses but most of his bills are paid off He was previous a truck driver  Conditions: HTN, arthritis in Knees, both knees and back, DM, hyperlipidemia, , hx of colonic polyps, OSA, allergies, subclinical hypothyroidism   Medications He is on 11 medicines + CPAP oxygen  The medications he is primary having concerns with are:  1) Novolog went up form $20 to $40 then from $40 to $300+ With Novolog he takes 20 units but if cbg 150+ add 2u unit He provided an example He reports if his cbg is at 200 then he takes 22 units  He reports he was running out of insulin before time to refill Rx [name], [name] clinic,endocrinologist is managing this and is aware of these concerns with cost and running out of medicine before time to refill  2) Lantus  went from $40 to $200 co pay Reports [name] gave him forms to fill out for novartis patient assistance but he has not turned them in at this time related to noting that the form states the out of pocket cost has to be over $1000 before assistance offered  He presently has $875 worth of receipts for 2 months at this time   Appointments: he states he sees the  primary MD q 6 months and the endocrinologist every 3-6 months  Advance Directives: He denies need for assist with or assist with changes for advance directives He wants to work on these items with a local credit union at no cost but will contact [facility] staff for assistance at a later time as needed  Consent: [facility] RN CM reviewed [facility] services with patient. Patient gave verbal consent for services [facility] pharmacy  Plan: [facility] RN CM will refer [name] to [place] for resources while in coverage gap related to Novolog and Lantus    Juan Mecca L. Lavina Hamman, RN, BSN, CCM [facility] Telephonic Care Management Care Coordinator Direct number [phone]  [facility] number [phone] Fax number [phone]

## 2018-02-01 ENCOUNTER — Other Ambulatory Visit: Payer: Self-pay

## 2018-02-01 NOTE — Patient Outreach (Signed)
[facility]) Care Management  02/01/2018  [name] [birth year] [id number]  68 year old male referred to [place] Management.   Lead services requested for patient assistance with Lantus and Novolog.  PMHx includes, but not limited to, hypertension, COPD, GERD with esophagitis  Successful outreach attempt to [name].  HIPAA identifers verified.    Medication Assistance: [name] states that he cannot afford his Novolog and Lantus.  He reports that he is in the donut hole.  Per [employer], he has spent $978.83 out of pocket (OOP).  He is very close to meeting the OOP requirement of [employer] of $1000 to be eligible for their patient assistance program.  Patient states that he already has a [employer] for [employer] that Dr. Gabriel Carina has signed.  He will complete his portion of the application today.  I requested that he hold the form until he hears back from me early next week.   To qualify for Lantus patient assistance through [place] he will have to spend 5% of income which is ~$1550.  Will outreach to Dr. Gabriel Carina to see if patient could switch to Levemir made by [employer].  [place] could begin the application process for both of his insulins concurrently.   Plan: Route note to Dr. Gabriel Carina to see if she will authorize a switch from Lantus to Levemir.  Begin patient assistance application process for Novolog and possibly Levemir (if approved by [name]).   Follow up with [name] to inform him of plan.  Joetta Manners, PharmD Clinical Pharmacist [place] [phone]

## 2018-02-03 DIAGNOSIS — G4733 Obstructive sleep apnea (adult) (pediatric): Secondary | ICD-10-CM | POA: Diagnosis not present

## 2018-02-04 ENCOUNTER — Other Ambulatory Visit: Payer: Self-pay

## 2018-02-04 NOTE — Patient Outreach (Signed)
[facility]) Care Management  02/04/2018  TERESA LEMMERMAN July 21, 1950 [id number]  Incoming call received from [facility] at Dr. Joycie Peek office.  Dr. Gabriel Carina would like to try for patient assistance for Tresiba along with his Novolog from [employer].  If approved, Tyler Aas would substitute for Lantus that he is currently using.   Plan: Route note to CPhT, Etter Sjogren to begin [place] and Novolog patient assistance from [employer].    Joetta Manners, PharmD Clinical Pharmacist [place] [phone]

## 2018-02-05 ENCOUNTER — Other Ambulatory Visit: Payer: Self-pay | Admitting: Pharmacy Technician

## 2018-02-05 NOTE — Patient Outreach (Signed)
[facility]) Care Management  02/06/2018  [name] 03-22-1951 [id number]   Received Novo Nordisk patient assistance referral from [place] for [place] and [employer]. Prepared patient portion to be mailed and faxed provider portion to Dr. Gabriel Carina.  Will follow up with patient in 7-10 business days to confirm application has been received.  Maud Deed [place], [place] Management [phone]

## 2018-02-10 DIAGNOSIS — G4733 Obstructive sleep apnea (adult) (pediatric): Secondary | ICD-10-CM | POA: Diagnosis not present

## 2018-02-10 DIAGNOSIS — J449 Chronic obstructive pulmonary disease, unspecified: Secondary | ICD-10-CM | POA: Diagnosis not present

## 2018-02-10 DIAGNOSIS — R0902 Hypoxemia: Secondary | ICD-10-CM | POA: Diagnosis not present

## 2018-02-19 ENCOUNTER — Encounter: Payer: Self-pay | Admitting: Podiatry

## 2018-02-19 ENCOUNTER — Ambulatory Visit: Payer: PPO | Admitting: Podiatry

## 2018-02-19 DIAGNOSIS — M79676 Pain in unspecified toe(s): Secondary | ICD-10-CM | POA: Diagnosis not present

## 2018-02-19 DIAGNOSIS — B351 Tinea unguium: Secondary | ICD-10-CM | POA: Diagnosis not present

## 2018-02-19 DIAGNOSIS — Q828 Other specified congenital malformations of skin: Secondary | ICD-10-CM

## 2018-02-19 DIAGNOSIS — E1149 Type 2 diabetes mellitus with other diabetic neurological complication: Secondary | ICD-10-CM

## 2018-02-19 NOTE — Progress Notes (Signed)
Subjective: 67 y.o. returns the office today for painful, elongated, thickened toenails which he cannot trim himself and for calluses. Denies any redness or drainage around the nails/callus. Requesting diabetic shoes. Denies any acute changes since last appointment and no new complaints today. Denies any systemic complaints such as fevers, chills, nausea, vomiting.   PCP: Pleas Koch, NP Endocrinologist: Dr. Lucilla Lame  Objective: AAO 3, NAD DP/PT pulses palpable, CRT less than 3 seconds Nails hypertrophic, dystrophic, elongated, brittle, discolored 10. There is tenderness overlying the nails 1-5 bilaterally. There is no surrounding erythema or drainage along the nail sites. Hyperkeratotic lesions right sub 2/4. No underlying ulceration, drainage, signs of infection.  Prominent metatarsal heads bilaterally. Hammertoes present.  No open lesions or pre-ulcerative lesions are identified. No other areas of tenderness bilateral lower extremities. No overlying edema, erythema, increased warmth. No pain with calf compression, swelling, warmth, erythema.  Assessment: Patient presents with symptomatic onychomycosis  Plan: -Treatment options including alternatives, risks, complications were discussed -Nails sharply debrided 10 without complication/bleeding. -Hyperkeratotic lesions sharply debrided x 2 without complications or bleeding.  -Paperwork completed today for pre-certification of diabetic shoes. Given to [facility]. Follow-up with Liliane Channel or Benjie Karvonen.  -Discussed daily foot inspection. If there are any changes, to call the office immediately.  -Follow-up in 3 months or sooner if any problems are to arise. In the meantime, encouraged to call the office with any questions, concerns, changes symptoms.  Celesta Gentile, DPM

## 2018-02-25 ENCOUNTER — Ambulatory Visit: Payer: PPO | Admitting: Orthotics

## 2018-02-25 DIAGNOSIS — E1149 Type 2 diabetes mellitus with other diabetic neurological complication: Secondary | ICD-10-CM

## 2018-02-25 DIAGNOSIS — Q828 Other specified congenital malformations of skin: Secondary | ICD-10-CM

## 2018-02-25 NOTE — Progress Notes (Signed)

## 2018-02-26 ENCOUNTER — Other Ambulatory Visit: Payer: Self-pay | Admitting: *Deleted

## 2018-02-26 ENCOUNTER — Other Ambulatory Visit: Payer: Self-pay

## 2018-02-26 NOTE — Patient Outreach (Signed)
[facility]) Care Management  02/26/2018  [name] 1951-07-04 [id number]  Successful outreach attempt to [name].  HIPAA identifiers verified.   Medication Assistance: [name] states that he is waiting to get proof of income from his bank, so he can mail his patient assistance applications back to [place].  He states he will mail as soon as he gets the needed information.  Plan: Forward note to CPhT, Etter Sjogren.  Joetta Manners, PharmD Clinical Pharmacist [place] [phone]

## 2018-02-26 NOTE — Patient Outreach (Addendum)
[facility]) Care Management  02/26/2018  [name] August 18, 1950 [id number]   Successful initial telephone outreach to member to complete [employer] health risk assessment screening. [name] states he has had diabetes since 1997 and sees an endocrinologist Juan. Lucilla Barton at the [facility] in [place]. He says his last Hgb A1C was 7.4%. (Per his [facility] medical record his most recent Hgb a1c was drawn on 12/21/17 and it was 7.6%.)He states he checks his blood sugar 3-4 times daily and takes both Lantus and novolog insulin. He uses a sliding scale to determine hi mealtime dosage. He is receptive to trying a continuous glucose monitor if the out of pocket cost are reasonable. He also asked about exercise to help him with weight loss and knee strengthening that he would be able to do and not re- injure his knee. He states he is disabled due to a knee injury at work several years ago.  He also relates that since the knee injury he has gained about 50 lbs and is having more difficulty controlling his blood sugar. which caused him to leave his work due to disability and he has gained 50 lbs. Sent In Basket message to [name]- his sports medicine MD - requesting for home exercise program or suggestions.  He also expressed interest in receiving information about a living will and advance directive for both he and his wife.  Will mail information about the [facility] glucose monitor, Planning Healthy Meals  Brochure,  Carb Counting and Meal Planning booklet, two Advance Directive packets and the [employer] [place] [place] [employer] Directive educational document, along with successful outreach letter and [name] Management information brochure and 24 hour nurse advice line to his home address.  Encouraged him to reach ou to this RNCM if he has questions about the material that he receives in the mail.   Juan Ellison RN,CCM,CDE [place]  Management Coordinator Office Phone [phone] Office Fax [phone]

## 2018-02-27 ENCOUNTER — Other Ambulatory Visit: Payer: Self-pay | Admitting: *Deleted

## 2018-02-27 ENCOUNTER — Encounter: Payer: Self-pay | Admitting: *Deleted

## 2018-02-27 NOTE — Patient Outreach (Signed)
[facility]) Care Management  02/27/2018  [name] 03-24-1951 [id number]   Received requested feedback from both Dr. Lorelei Pont and Allie Bossier NP via In Basket messages regarding exercise suggestions. Mailed these suggestions to [name] in addition to other educational materials noted in initial note of 8/13.  Also emailed [employer] concierge and requested they contact member to discuss benefit coverage related to [facility] flash glucose monitoring system.   [name] RN,CCM,CDE [place] Management Coordinator Office Phone [phone] Office Fax [phone]

## 2018-03-05 DIAGNOSIS — E119 Type 2 diabetes mellitus without complications: Secondary | ICD-10-CM | POA: Diagnosis not present

## 2018-03-05 LAB — HM DIABETES EYE EXAM

## 2018-03-09 ENCOUNTER — Encounter: Payer: Self-pay | Admitting: Primary Care

## 2018-03-13 DIAGNOSIS — J449 Chronic obstructive pulmonary disease, unspecified: Secondary | ICD-10-CM | POA: Diagnosis not present

## 2018-03-13 DIAGNOSIS — G4733 Obstructive sleep apnea (adult) (pediatric): Secondary | ICD-10-CM | POA: Diagnosis not present

## 2018-03-13 DIAGNOSIS — R0902 Hypoxemia: Secondary | ICD-10-CM | POA: Diagnosis not present

## 2018-03-14 ENCOUNTER — Telehealth: Payer: Self-pay | Admitting: Internal Medicine

## 2018-03-14 ENCOUNTER — Other Ambulatory Visit: Payer: Self-pay | Admitting: Pharmacy Technician

## 2018-03-14 NOTE — Telephone Encounter (Signed)
Please call regarding follow up office notes for CPAP machine. Needs to state that pt is using and benefiting and is compliant. Fax [phone] Attn: DME Authorization Central

## 2018-03-14 NOTE — Patient Outreach (Signed)
[facility]) Care Management  03/14/2018  [name] 22-Mar-1951 [id number]   Received patient portion of [employer] application for [employer]. Submitted completed application and required documents to company.  Will follow up with Novo Nordisk to check status of application in 2-3 business days.  [name], [place] Management [phone]

## 2018-03-14 NOTE — Telephone Encounter (Signed)
Last office visit faxed [facility]. Nothing further needed.

## 2018-03-21 ENCOUNTER — Other Ambulatory Visit: Payer: Self-pay | Admitting: Pharmacy Technician

## 2018-03-21 NOTE — Patient Outreach (Signed)
[facility]) Care Management  03/21/2018  SHELBY ANDERLE 04-13-1951 [id number]   Follow up call to check status of patients applications for [place] and Novolog. Barry Brunner confirmed patient has been approved as of 9/5 until 06/15/2018 and medication should arrive at providers office in the next 7-10 business days.  Will follow up with patient in 10-04 business days to confirm medication has been received.  Maud Deed [place], [place] Management [phone]

## 2018-03-26 ENCOUNTER — Other Ambulatory Visit: Payer: Self-pay | Admitting: Primary Care

## 2018-03-26 DIAGNOSIS — E1142 Type 2 diabetes mellitus with diabetic polyneuropathy: Secondary | ICD-10-CM | POA: Diagnosis not present

## 2018-03-26 DIAGNOSIS — E039 Hypothyroidism, unspecified: Secondary | ICD-10-CM

## 2018-03-26 NOTE — Telephone Encounter (Signed)
Made in error

## 2018-04-02 ENCOUNTER — Other Ambulatory Visit: Payer: Self-pay | Admitting: Primary Care

## 2018-04-02 ENCOUNTER — Other Ambulatory Visit: Payer: Self-pay | Admitting: Pharmacy Technician

## 2018-04-02 DIAGNOSIS — Z789 Other specified health status: Secondary | ICD-10-CM | POA: Diagnosis not present

## 2018-04-02 DIAGNOSIS — Z794 Long term (current) use of insulin: Secondary | ICD-10-CM | POA: Diagnosis not present

## 2018-04-02 DIAGNOSIS — E1142 Type 2 diabetes mellitus with diabetic polyneuropathy: Secondary | ICD-10-CM | POA: Diagnosis not present

## 2018-04-02 DIAGNOSIS — E1165 Type 2 diabetes mellitus with hyperglycemia: Secondary | ICD-10-CM | POA: Diagnosis not present

## 2018-04-02 NOTE — Patient Outreach (Signed)
[facility]) Care Management  04/02/2018  [name] 1951/02/23 [id number]   Successful outreach call to patient, HIPAA identifiers verified. [name] confirmed that he has received his [place] and Novolog from [employer]. Reviewed with [name] how to obtain refills before 06/15/2018.  Will route note to [place] for case closure.  Maud Deed [place], [place] Management [phone]

## 2018-04-03 ENCOUNTER — Other Ambulatory Visit (INDEPENDENT_AMBULATORY_CARE_PROVIDER_SITE_OTHER): Payer: PPO

## 2018-04-03 ENCOUNTER — Other Ambulatory Visit: Payer: Self-pay | Admitting: Primary Care

## 2018-04-03 ENCOUNTER — Other Ambulatory Visit: Payer: Self-pay

## 2018-04-03 DIAGNOSIS — E039 Hypothyroidism, unspecified: Secondary | ICD-10-CM

## 2018-04-03 DIAGNOSIS — E038 Other specified hypothyroidism: Secondary | ICD-10-CM

## 2018-04-03 LAB — TSH: TSH: 5.13 u[IU]/mL — ABNORMAL HIGH (ref 0.35–4.50)

## 2018-04-03 NOTE — Patient Outreach (Signed)
[facility]) Care Management  04/03/2018  BERTON BUTRICK 1950-11-25 [id number]   Mr. Riel has verified receipt of his patient assistance medications (Tresbia and Novolog)  from [employer].  Plan: Close his [place] case.  Route case closure letter to PCP, Dr. Carlis Abbott.  Joetta Manners, PharmD Clinical Pharmacist [place] [phone]

## 2018-04-04 ENCOUNTER — Ambulatory Visit (INDEPENDENT_AMBULATORY_CARE_PROVIDER_SITE_OTHER): Payer: Self-pay | Admitting: Orthotics

## 2018-04-04 DIAGNOSIS — M2041 Other hammer toe(s) (acquired), right foot: Secondary | ICD-10-CM | POA: Diagnosis not present

## 2018-04-04 DIAGNOSIS — E1149 Type 2 diabetes mellitus with other diabetic neurological complication: Secondary | ICD-10-CM

## 2018-04-04 DIAGNOSIS — M2042 Other hammer toe(s) (acquired), left foot: Secondary | ICD-10-CM

## 2018-04-04 DIAGNOSIS — Q828 Other specified congenital malformations of skin: Secondary | ICD-10-CM

## 2018-04-04 NOTE — Progress Notes (Signed)

## 2018-04-06 DIAGNOSIS — G4733 Obstructive sleep apnea (adult) (pediatric): Secondary | ICD-10-CM | POA: Diagnosis not present

## 2018-04-08 ENCOUNTER — Encounter: Payer: Self-pay | Admitting: Primary Care

## 2018-04-08 ENCOUNTER — Ambulatory Visit (INDEPENDENT_AMBULATORY_CARE_PROVIDER_SITE_OTHER): Payer: PPO | Admitting: Primary Care

## 2018-04-08 VITALS — BP 150/82 | HR 85 | Temp 98.3°F | Ht 70.0 in | Wt 312.8 lb

## 2018-04-08 DIAGNOSIS — E039 Hypothyroidism, unspecified: Secondary | ICD-10-CM | POA: Diagnosis not present

## 2018-04-08 DIAGNOSIS — I10 Essential (primary) hypertension: Secondary | ICD-10-CM

## 2018-04-08 DIAGNOSIS — E038 Other specified hypothyroidism: Secondary | ICD-10-CM

## 2018-04-08 DIAGNOSIS — Z6841 Body Mass Index (BMI) 40.0 and over, adult: Secondary | ICD-10-CM

## 2018-04-08 DIAGNOSIS — R635 Abnormal weight gain: Secondary | ICD-10-CM

## 2018-04-08 DIAGNOSIS — Z23 Encounter for immunization: Secondary | ICD-10-CM | POA: Diagnosis not present

## 2018-04-08 LAB — BRAIN NATRIURETIC PEPTIDE: Pro B Natriuretic peptide (BNP): 22 pg/mL (ref 0.0–100.0)

## 2018-04-08 NOTE — Assessment & Plan Note (Signed)
Above goal in the office today, also with his last documented visit in July 2019. He is compliant to his medications. Will have him start monitoring home readings, work on some exercise and continued improvements in his diet.  Follow up in 4 weeks for BP check. If no improvement then will add in medication.

## 2018-04-08 NOTE — Addendum Note (Signed)
Addended by: Jacqualin Combes on: 04/08/2018 12:36 PM   Modules accepted: Orders

## 2018-04-08 NOTE — Patient Instructions (Addendum)
Start taking your levothyroxine 25 mcg every morning with water only. No food or medications for at least 30 minutes.   Schedule a follow up appointment in 4 weeks to have your thyroid rechecked and for blood pressure check.  Start monitoring your blood pressure daily, around the same time of day, for the next several weeks.  Ensure that you have rested for 30 minutes prior to checking your blood pressure. Record your readings and bring them to your next visit.  Stop by the lab prior to leaving today. I will notify you of your results once received.   It was a pleasure to see you today!

## 2018-04-08 NOTE — Assessment & Plan Note (Signed)
Compliant to levothyroxine but is taking with other medications, this may explain elevated TSH.  Discussed to take with water only, no food or medications for 30 minutes.  Repeat TSH in 4 weeks and if above goal then will increase dose.

## 2018-04-08 NOTE — Assessment & Plan Note (Signed)
Gradual weight gain over time. Exertional dyspnea could be from weight gain, COPD, or underlying heart failure. Check BNP today.  Discussed to continue working on his diet, start exercising.

## 2018-04-08 NOTE — Progress Notes (Signed)
Subjective:    Patient ID: [name], male    DOB: [birth year], 67 y.o.   MRN: [id number]  HPI  [name] is a 67 year old male who presents today for follow up of hypothyroidism.  He is currently managed on levothyroxine 25 mcg. His last TSH was in December 2017 with a level of 3.37. His most recent TSH was 5.13.  He is compliant to levothyroxine every morning with water and with his other medications. He waits to eat for 30 minutes.   He has noticed a progressive weight gain over the year. He does feel more short of breath when tying his shoes and with moderate exertion. He denies cough, intermittent lower extremity edema, chest pain, mild dyspnea upon exertion. He is following with endocrinology for his diabetes, also pulmonology.   Diet currently consists of:  Breakfast: Cereal, cottage cheese with fruit, eggs/toast/sausage Lunch: Salad, sandwich Dinner: Grilled meat, vegetables, chicken pot pie Snacks: None Desserts: None Beverages: Unsweet tea, water.   Exercise: He is not exercising   Wt Readings from Last 3 Encounters:  04/08/18 (!) 312 lb 12 oz (141.9 kg)  01/22/18 (!) 309 lb (140.2 kg)  11/22/17 (!) 302 lb (137 kg)   BP Readings from Last 3 Encounters:  04/08/18 (!) 150/82  01/22/18 (!) 142/76  11/22/17 128/78     Review of Systems  Eyes: Negative for visual disturbance.  Respiratory:       Moderate exertional shortness of breath  Cardiovascular: Negative for chest pain.       Intermittent lower extremity edema  Neurological: Negative for dizziness and headaches.       Past Medical History:  Diagnosis Date  . Arthritis    knee  . Bronchitis   . Colon polyp 2008  . Diabetes mellitus   . Dyspnea   . Hypercholesteremia   . Hypertension   . Hypothyroidism      Social History   Socioeconomic History  . Marital status: Married    Spouse name: Not on file  . Number of children: Not on file  . Years of education: Not on file  . Highest  education level: Not on file  Occupational History  . Not on file  Social Needs  . Financial resource strain: Not on file  . Food insecurity:    Worry: Not on file    Inability: Not on file  . Transportation needs:    Medical: Not on file    Non-medical: Not on file  Tobacco Use  . Smoking status: Former Smoker    Packs/day: 2.00    Years: 35.00    Pack years: 70.00    Types: Cigarettes, E-cigarettes    Last attempt to quit: 12/11/2014    Years since quitting: 3.3  . Smokeless tobacco: Never Used  Substance and Sexual Activity  . Alcohol use: No    Alcohol/week: 0.0 standard drinks  . Drug use: No  . Sexual activity: Not on file  Lifestyle  . Physical activity:    Days per week: Not on file    Minutes per session: Not on file  . Stress: Not on file  Relationships  . Social connections:    Talks on phone: Not on file    Gets together: Not on file    Attends religious service: Not on file    Active member of club or organization: Not on file    Attends meetings of clubs or organizations: Not on file  Relationship status: Not on file  . Intimate partner violence:    Fear of current or ex partner: Not on file    Emotionally abused: Not on file    Physically abused: Not on file    Forced sexual activity: Not on file  Other Topics Concern  . Not on file  Social History Narrative   Married.   3 children. 2 grandchildren.   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     Past Surgical History:  Procedure Laterality Date  . COLONOSCOPY  2008, 2016   Dr Bary Castilla  . COLONOSCOPY WITH PROPOFOL N/A 10/03/2017   Procedure: COLONOSCOPY WITH PROPOFOL;  Surgeon: Robert Bellow, MD;  Location: [facility] ENDOSCOPY;  Service: Endoscopy;  Laterality: N/A;  . DERMATOFIBROMA  03/26/15   Procedure: Excision mass left leg;  Surgeon: Robert Bellow, MD;  Location: [facility] ORS;  Service: General;  Laterality: N/A;  . LIPOMA EXCISION N/A  03/26/2015      . TONSILLECTOMY      Family History  Problem Relation Age of Onset  . Stroke Mother   . Diabetes Mother   . Stroke Father   . Diabetes Other   . Other Daughter 25       precancerous colon polyp   . Diabetes Maternal Grandmother   . Diabetes Paternal Grandmother     Allergies  Allergen Reactions  . Atorvastatin     Other reaction(s): Headache  . Other Other (See Comments)    STATIN DRUG-CAUSED MUSCLE PAIN/LETHARGIC    Current Outpatient Medications on File Prior to Visit  Medication Sig Dispense Refill  . albuterol (PROVENTIL HFA;VENTOLIN HFA) 108 (90 Base) MCG/ACT inhaler Inhale 2 puffs into the lungs every 4 (four) hours as needed for wheezing or shortness of breath. 1 Inhaler 0  . aspirin EC 81 MG tablet Take 81 mg by mouth daily.    . fenofibrate (TRICOR) 145 MG tablet Take 145 mg by mouth daily.    . fluticasone (FLONASE) 50 MCG/ACT nasal spray Place 1 spray into both nostrils 2 (two) times daily as needed for allergies or rhinitis. 16 g 0  . hydrochlorothiazide (HYDRODIURIL) 50 MG tablet Take 1 tablet (50 mg total) by mouth daily. 90 tablet 1  . Insulin Glargine (LANTUS SOLOSTAR) 100 UNIT/ML Solostar Pen Inject 68 Units into the skin daily at 10 pm.    . levothyroxine (SYNTHROID, LEVOTHROID) 25 MCG tablet TAKE 1 TABLET BY MOUTH ONCE A DAY. TAKE ON AN EMPTY STOMACH WITH A GLASS OF WATER ATLEAST 30-60 MIN BEFORE BREAKFAST 30 tablet 0  . metFORMIN (GLUCOPHAGE) 1000 MG tablet TAKE 1 TABLET BY MOUTH TWICE A DAY 180 tablet 1  . NOVOLOG FLEXPEN 100 UNIT/ML FlexPen Inject 20 units before breakfast and lunch, inject 22 units before supper. Increase when blood sugar is over 150. Use 2 units more for every 50 over.  9  . ramipril (ALTACE) 10 MG capsule TAKE 1 CAPSULE BY MOUTH 2 TIMES DAILY 180 capsule 1  . ranitidine (ZANTAC) 150 MG tablet Take 150 mg by mouth 2 (two) times daily.     No current facility-administered medications on file prior to visit.     BP (!)  150/82   Pulse 85   Temp 98.3 F (36.8 C) (Oral)   Ht 5\' 10"  (1.778 m)   Wt (!) 312 lb 12 oz (141.9 kg)   SpO2 97%   BMI 44.87 kg/m  Objective:   Physical Exam  Constitutional: He appears well-nourished.  Neck: Neck supple.  Cardiovascular: Normal rate and regular rhythm.  Respiratory: Effort normal and breath sounds normal.  Skin: Skin is warm and dry.           Assessment & Plan:

## 2018-04-13 DIAGNOSIS — J449 Chronic obstructive pulmonary disease, unspecified: Secondary | ICD-10-CM | POA: Diagnosis not present

## 2018-04-13 DIAGNOSIS — G4733 Obstructive sleep apnea (adult) (pediatric): Secondary | ICD-10-CM | POA: Diagnosis not present

## 2018-04-13 DIAGNOSIS — R0902 Hypoxemia: Secondary | ICD-10-CM | POA: Diagnosis not present

## 2018-04-23 DIAGNOSIS — G4733 Obstructive sleep apnea (adult) (pediatric): Secondary | ICD-10-CM | POA: Diagnosis not present

## 2018-04-30 ENCOUNTER — Other Ambulatory Visit: Payer: Self-pay | Admitting: Primary Care

## 2018-04-30 DIAGNOSIS — E039 Hypothyroidism, unspecified: Secondary | ICD-10-CM

## 2018-05-06 DIAGNOSIS — G4733 Obstructive sleep apnea (adult) (pediatric): Secondary | ICD-10-CM | POA: Diagnosis not present

## 2018-05-09 ENCOUNTER — Ambulatory Visit (INDEPENDENT_AMBULATORY_CARE_PROVIDER_SITE_OTHER): Payer: PPO | Admitting: Primary Care

## 2018-05-09 ENCOUNTER — Other Ambulatory Visit: Payer: Self-pay | Admitting: Primary Care

## 2018-05-09 ENCOUNTER — Encounter: Payer: Self-pay | Admitting: Primary Care

## 2018-05-09 VITALS — BP 130/68 | HR 80 | Temp 98.2°F | Ht 70.0 in | Wt 309.5 lb

## 2018-05-09 DIAGNOSIS — E039 Hypothyroidism, unspecified: Secondary | ICD-10-CM

## 2018-05-09 DIAGNOSIS — E038 Other specified hypothyroidism: Secondary | ICD-10-CM

## 2018-05-09 LAB — TSH: TSH: 7.66 u[IU]/mL — ABNORMAL HIGH (ref 0.35–4.50)

## 2018-05-09 MED ORDER — LEVOTHYROXINE SODIUM 50 MCG PO TABS: ORAL_TABLET | ORAL | 0 refills | Status: DC

## 2018-05-09 NOTE — Progress Notes (Signed)
Subjective:    Patient ID: [name], male    DOB: 10-06-1950, 67 y.o.   MRN: [id number]  HPI  [name] is a 67 year old male who presents today for follow up for hypothyroidism.  He was last evaluated in late September with an elevated TSH of 5.13. He admitted to taking his levothyroxine every morning with his other medications. He did not eat for 30 minutes after taking.  Since his last visit he's taking his levothyroxine in the morning by itself. He is waiting 30 minutes before eating and taking his other medications. He is taking his Zantac once nightly rather that twice daily.   He denies chest pain, dizziness, cold/heat intolerance.   Review of Systems  Respiratory: Negative for shortness of breath.   Cardiovascular: Negative for chest pain.  Endocrine: Negative for cold intolerance and heat intolerance.  Neurological: Negative for dizziness.       Past Medical History:  Diagnosis Date  . Arthritis    knee  . Bronchitis   . Colon polyp 2008  . Diabetes mellitus   . Dyspnea   . Hypercholesteremia   . Hypertension   . Hypothyroidism      Social History   Socioeconomic History  . Marital status: Married    Spouse name: Not on file  . Number of children: Not on file  . Years of education: Not on file  . Highest education level: Not on file  Occupational History  . Not on file  Social Needs  . Financial resource strain: Not on file  . Food insecurity:    Worry: Not on file    Inability: Not on file  . Transportation needs:    Medical: Not on file    Non-medical: Not on file  Tobacco Use  . Smoking status: Former Smoker    Packs/day: 2.00    Years: 35.00    Pack years: 70.00    Types: Cigarettes, E-cigarettes    Last attempt to quit: 12/11/2014    Years since quitting: 3.4  . Smokeless tobacco: Never Used  Substance and Sexual Activity  . Alcohol use: No    Alcohol/week: 0.0 standard drinks  . Drug use: No  . Sexual activity: Not on file    Lifestyle  . Physical activity:    Days per week: Not on file    Minutes per session: Not on file  . Stress: Not on file  Relationships  . Social connections:    Talks on phone: Not on file    Gets together: Not on file    Attends religious service: Not on file    Active member of club or organization: Not on file    Attends meetings of clubs or organizations: Not on file    Relationship status: Not on file  . Intimate partner violence:    Fear of current or ex partner: Not on file    Emotionally abused: Not on file    Physically abused: Not on file    Forced sexual activity: Not on file  Other Topics Concern  . Not on file  Social History Narrative   Married.   3 children. 2 grandchildren.   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     Past Surgical History:  Procedure Laterality Date  . COLONOSCOPY  2008, 2016   Dr Bary Castilla  . COLONOSCOPY WITH PROPOFOL N/A 10/03/2017   Procedure: COLONOSCOPY WITH PROPOFOL;  Surgeon: Robert Bellow, MD;  Location[facility] ENDOSCOPY;  Service: Endoscopy;  Laterality: N/A;  . DERMATOFIBROMA  03/26/15   Procedure: Excision mass left leg;  Surgeon: Robert Bellow, MD;  Location: [facility] ORS;  Service: General;  Laterality: N/A;  . LIPOMA EXCISION N/A 03/26/2015      . TONSILLECTOMY      Family History  Problem Relation Age of Onset  . Stroke Mother   . Diabetes Mother   . Stroke Father   . Diabetes Other   . Other Daughter 25       precancerous colon polyp   . Diabetes Maternal Grandmother   . Diabetes Paternal Grandmother     Allergies  Allergen Reactions  . Atorvastatin     Other reaction(s): Headache  . Other Other (See Comments)    STATIN DRUG-CAUSED MUSCLE PAIN/LETHARGIC    Current Outpatient Medications on File Prior to Visit  Medication Sig Dispense Refill  . albuterol (PROVENTIL HFA;VENTOLIN HFA) 108 (90 Base) MCG/ACT inhaler Inhale 2 puffs into the lungs every 4 (four)  hours as needed for wheezing or shortness of breath. 1 Inhaler 0  . aspirin EC 81 MG tablet Take 81 mg by mouth daily.    . fenofibrate (TRICOR) 145 MG tablet Take 145 mg by mouth daily.    . fluticasone (FLONASE) 50 MCG/ACT nasal spray Place 1 spray into both nostrils 2 (two) times daily as needed for allergies or rhinitis. 16 g 0  . hydrochlorothiazide (HYDRODIURIL) 50 MG tablet Take 1 tablet (50 mg total) by mouth daily. 90 tablet 1  . Insulin Glargine (LANTUS SOLOSTAR) 100 UNIT/ML Solostar Pen Inject 68 Units into the skin daily at 10 pm.    . levothyroxine (SYNTHROID, LEVOTHROID) 25 MCG tablet TAKE 1 TAB BY MOUTH ONCE DAILY. TAKE ON AN EMPTY STOMACH WITH A GLASSOF WATER ATLEAST 30-60 MINUTES BEFORE BREAKFAST 30 tablet 0  . metFORMIN (GLUCOPHAGE) 1000 MG tablet TAKE 1 TABLET BY MOUTH TWICE A DAY 180 tablet 1  . NOVOLOG FLEXPEN 100 UNIT/ML FlexPen Inject 20 units before breakfast and lunch, inject 22 units before supper. Increase when blood sugar is over 150. Use 2 units more for every 50 over.  9  . ramipril (ALTACE) 10 MG capsule TAKE 1 CAPSULE BY MOUTH 2 TIMES DAILY 180 capsule 1  . ranitidine (ZANTAC) 150 MG tablet Take 150 mg by mouth 2 (two) times daily.     No current facility-administered medications on file prior to visit.     BP 130/68   Pulse 80   Temp 98.2 F (36.8 C) (Oral)   Ht 5\' 10"  (1.778 m)   Wt (!) 309 lb 8 oz (140.4 kg)   SpO2 98%   BMI 44.41 kg/m    Objective:   Physical Exam  Constitutional: He is oriented to person, place, and time. He appears well-nourished.  Neck: Neck supple.  Cardiovascular: Normal rate and regular rhythm.  Respiratory: Effort normal and breath sounds normal.  Neurological: He is alert and oriented to person, place, and time.  Skin: Skin is warm and dry.           Assessment & Plan:

## 2018-05-09 NOTE — Patient Instructions (Signed)
Stop by the lab prior to leaving today. I will notify you of your results once received.   Continue to take your levothyroxine in the morning with water only. No food or other medications for 30 minutes. You may take your Zantac at lunch and dinner for heartburn.  It was a pleasure to see you today!

## 2018-05-09 NOTE — Assessment & Plan Note (Signed)
Now taking levothyroxine appropriately. Repeat TSH pending. Continue levothyroxine 25 mcg for now, will adjust accordingly if needed.

## 2018-05-13 DIAGNOSIS — J449 Chronic obstructive pulmonary disease, unspecified: Secondary | ICD-10-CM | POA: Diagnosis not present

## 2018-05-13 DIAGNOSIS — R0902 Hypoxemia: Secondary | ICD-10-CM | POA: Diagnosis not present

## 2018-05-13 DIAGNOSIS — G4733 Obstructive sleep apnea (adult) (pediatric): Secondary | ICD-10-CM | POA: Diagnosis not present

## 2018-05-23 ENCOUNTER — Ambulatory Visit: Payer: PPO | Admitting: Podiatry

## 2018-05-23 DIAGNOSIS — B351 Tinea unguium: Secondary | ICD-10-CM

## 2018-05-23 DIAGNOSIS — M79675 Pain in left toe(s): Secondary | ICD-10-CM | POA: Diagnosis not present

## 2018-05-23 DIAGNOSIS — M79674 Pain in right toe(s): Secondary | ICD-10-CM

## 2018-05-23 NOTE — Patient Instructions (Signed)

## 2018-06-06 DIAGNOSIS — G4733 Obstructive sleep apnea (adult) (pediatric): Secondary | ICD-10-CM | POA: Diagnosis not present

## 2018-06-09 ENCOUNTER — Encounter: Payer: Self-pay | Admitting: Podiatry

## 2018-06-09 NOTE — Progress Notes (Signed)
Subjective: [name] presents today for painful, discolored, thick toenails and painful calluses which interfere with daily activities.  Pain is aggravated when weightbearing and  wearing enclosed shoe gear. Pain is relieved with periodic professional debridement.  Objective: Vascular Examination: Capillary refill time < 3 seconds x 10 digits Dorsalis pedis palpable b/l Posterior tibial pulses palpable b/l Digital hair x 10 digits absent b/l Skin temperature gradient WNL b/l  Dermatological Examination: Skin with normal turgor, texture and tone b/l Toenails 1-5 b/l discolored, thick, dystrophic with subungual debris and pain with palpation to nailbeds due to thickness of nails.  Musculoskeletal: Muscle strength 5/5 to all LE muscle groups Plantarflexed metatarsal heads b/l Hammertoes 2-5 b/l  Neurological: Sensation intact with 10 gram monofilament. Vibratory sensation intact.  Assessment: Painful onychomycosis toenails 1-5 b/l   Plan: 1. Toenails 1-5 b/l were debrided in length and girth without iatrogenic bleeding. 2. Patient to continue soft, supportive shoe gear 3. Patient to report any pedal injuries to medical professional immediately. 4. Follow up 3 months. Patient/POA to call should there be a concern in the interim.

## 2018-06-11 ENCOUNTER — Other Ambulatory Visit: Payer: Self-pay | Admitting: Primary Care

## 2018-06-11 DIAGNOSIS — E039 Hypothyroidism, unspecified: Secondary | ICD-10-CM

## 2018-06-13 DIAGNOSIS — G4733 Obstructive sleep apnea (adult) (pediatric): Secondary | ICD-10-CM | POA: Diagnosis not present

## 2018-06-13 DIAGNOSIS — R0902 Hypoxemia: Secondary | ICD-10-CM | POA: Diagnosis not present

## 2018-06-13 DIAGNOSIS — J449 Chronic obstructive pulmonary disease, unspecified: Secondary | ICD-10-CM | POA: Diagnosis not present

## 2018-06-14 ENCOUNTER — Telehealth: Payer: Self-pay

## 2018-06-14 NOTE — Telephone Encounter (Signed)
Call pt regarding lung screening. Pt is a former smoker. Pt would like appt on Dec. 13 th after 10. Pt denies any new health issues.

## 2018-06-17 ENCOUNTER — Telehealth: Payer: Self-pay | Admitting: *Deleted

## 2018-06-17 DIAGNOSIS — Z87891 Personal history of nicotine dependence: Secondary | ICD-10-CM

## 2018-06-17 DIAGNOSIS — Z122 Encounter for screening for malignant neoplasm of respiratory organs: Secondary | ICD-10-CM

## 2018-06-17 NOTE — Telephone Encounter (Signed)
Patient has been notified that annual lung cancer screening low dose CT scan is due currently or will be in near future. Confirmed that patient is within the age range of 55-77, and asymptomatic, (no signs or symptoms of lung cancer). Patient denies illness that would prevent curative treatment for lung cancer if found. Verified smoking history, (former, quit 12/11/14 70 pack year). The shared decision making visit was done 06/27/16. Patient is agreeable for CT scan being scheduled.

## 2018-06-20 ENCOUNTER — Other Ambulatory Visit (INDEPENDENT_AMBULATORY_CARE_PROVIDER_SITE_OTHER): Payer: PPO

## 2018-06-20 DIAGNOSIS — E039 Hypothyroidism, unspecified: Secondary | ICD-10-CM | POA: Diagnosis not present

## 2018-06-20 LAB — TSH: TSH: 4.07 u[IU]/mL (ref 0.35–4.50)

## 2018-06-28 ENCOUNTER — Ambulatory Visit
Admission: RE | Admit: 2018-06-28 | Discharge: 2018-06-28 | Disposition: A | Discharge status: Home / Self Care | Payer: PPO | Source: Ambulatory Visit | Attending: Nurse Practitioner | Admitting: Nurse Practitioner

## 2018-06-28 DIAGNOSIS — Z122 Encounter for screening for malignant neoplasm of respiratory organs: Secondary | ICD-10-CM | POA: Diagnosis not present

## 2018-06-28 DIAGNOSIS — Z87891 Personal history of nicotine dependence: Secondary | ICD-10-CM | POA: Diagnosis not present

## 2018-06-28 DIAGNOSIS — E1142 Type 2 diabetes mellitus with diabetic polyneuropathy: Secondary | ICD-10-CM | POA: Diagnosis not present

## 2018-07-01 ENCOUNTER — Encounter: Payer: Self-pay | Admitting: *Deleted

## 2018-07-05 DIAGNOSIS — E669 Obesity, unspecified: Secondary | ICD-10-CM | POA: Diagnosis not present

## 2018-07-05 DIAGNOSIS — E1169 Type 2 diabetes mellitus with other specified complication: Secondary | ICD-10-CM | POA: Diagnosis not present

## 2018-07-05 DIAGNOSIS — Z794 Long term (current) use of insulin: Secondary | ICD-10-CM | POA: Diagnosis not present

## 2018-07-05 DIAGNOSIS — E1165 Type 2 diabetes mellitus with hyperglycemia: Secondary | ICD-10-CM | POA: Diagnosis not present

## 2018-07-05 DIAGNOSIS — E1142 Type 2 diabetes mellitus with diabetic polyneuropathy: Secondary | ICD-10-CM | POA: Diagnosis not present

## 2018-07-06 DIAGNOSIS — G4733 Obstructive sleep apnea (adult) (pediatric): Secondary | ICD-10-CM | POA: Diagnosis not present

## 2018-07-13 DIAGNOSIS — J449 Chronic obstructive pulmonary disease, unspecified: Secondary | ICD-10-CM | POA: Diagnosis not present

## 2018-07-13 DIAGNOSIS — R0902 Hypoxemia: Secondary | ICD-10-CM | POA: Diagnosis not present

## 2018-07-13 DIAGNOSIS — G4733 Obstructive sleep apnea (adult) (pediatric): Secondary | ICD-10-CM | POA: Diagnosis not present

## 2018-07-23 ENCOUNTER — Encounter: Payer: Self-pay | Admitting: Internal Medicine

## 2018-07-24 NOTE — Progress Notes (Signed)
[facility]     Assessment and Plan:  Severe obstructive sleep apnea with AHI of 50. -Continue auto CPAP with pressure range of 10-20 cm H2O.  Chronic hypoxic respiratory failure. - Continue oxygen use at night with CPAP, will contact DME to see if he can hook the oxygen directly into the machine instead of into his mask.  COPD/emphysema. -Seen on low-dose CT scan and PFT. Continue low dose CT screening.  -Dyspnea on exertion multifactorial from COPD, obesity, deconditioning.   Morbid obesity. -Discussed importance of weight loss for his overall health.  Dyspnea on exertion. -Multifactorial secondary to above.   Return in about 1 year (around 07/26/2019).    Date: 07/24/2018  MRN# [id number] [name] Jun 01, 1951    [name] is a 68 y.o. old male seen in consultation for chief complaint of:    Chief Complaint  Patient presents with  . Follow-up    breathing going well today  . Sleep Apnea    some days better than others, feels like he is drowning  . Cough    current cold    HPI:  The patient is a 68 year old male with history of COPD, OSA, morbid obesity.  He has been doing well with CPAP, he is using it every night, he is more awake during the day, no falling asleep during the day.  He feels that his breathing is doing well, he has not had to use his inhaler for COPD.    It was thought that his dyspnea was multifactorial from COPD as well as deconditioning, obesity.  He underwent pulmonary function testing which confirmed moderate obstructive lung disease and mild restriction secondary to body habitus.  He has a rescue inhaler but uses it rarely. He is using CPAP every night, and feels that he is doing better with it. He is more awake since he has been using the CPAP. He still take an hour nap about 3 days per week.   **CPAP download data 06/24/2018-07/23/2018>> raw data personally reviewed.  Usage greater than 4 hours is 25/30 days.  Average  usage on days used is 5 hours 57 minutes.  Pressure range 10-20.  Median pressure 11, 95th percentile pressure 16, maximum pressure 17.  Leaks are within normal limits residual AHI is 2.5.  Overall this shows good compliance with CPAP with excellent control of obstructive sleep apnea. **Download data 12/22/2017-01/20/2018>> data personally reviewed, uses greater than 4 hours is 29/30 days.  Average usage on days used is 6 hours 46 minutes.  Auto set 5-20.  Median pressure 10, 95th percentile pressure 15, maximum pressure 16.  Residual AHI 7.2.  Overall this shows very good compliance with CPAP with adequate control of obstructive sleep apnea. **PFT 01/08/2018>> patient's personally reviewed FVC is 69% predicted, FEV1 is 58% predicted, ratio 60%.  There is no significant improvement with bronchodilator therapy.  TLC is 78% predicted, ratio is normal.  Diffusion capacity severely reduced at 44%.  Flow volume loop suggestive of obstruction.  Overall this test is consistent with moderate obstructive lung disease without significant air trapping.  There is mild restriction likely secondary to body habitus. **HST 11/14/2017>> severe obstructive sleep apnea with AHI of 50.  Recommended auto CPAP with pressure range of 5-20 cmH2O. **Desat walk 10/18/17; baseline sat at rest on RA; 96% and HR 87; walked 180 feet at slow limping pace with mild dypspnea. Sat was 92% ahd HR 105.  **Low dose CT chest 06/27/17; emphysematous changes.  Social  Hx:   Social History   Tobacco Use  . Smoking status: Former Smoker    Packs/day: 2.00    Years: 35.00    Pack years: 70.00    Types: Cigarettes, E-cigarettes    Last attempt to quit: 12/11/2014    Years since quitting: 3.6  . Smokeless tobacco: Never Used  Substance Use Topics  . Alcohol use: No    Alcohol/week: 0.0 standard drinks  . Drug use: No   Medication:    Current Outpatient Medications:  .  albuterol (PROVENTIL HFA;VENTOLIN HFA) 108 (90 Base) MCG/ACT inhaler, Inhale  2 puffs into the lungs every 4 (four) hours as needed for wheezing or shortness of breath., Disp: 1 Inhaler, Rfl: 0 .  aspirin EC 81 MG tablet, Take 81 mg by mouth daily., Disp: , Rfl:  .  fenofibrate (TRICOR) 145 MG tablet, Take 145 mg by mouth daily., Disp: , Rfl:  .  fluticasone (FLONASE) 50 MCG/ACT nasal spray, Place 1 spray into both nostrils 2 (two) times daily as needed for allergies or rhinitis., Disp: 16 g, Rfl: 0 .  hydrochlorothiazide (HYDRODIURIL) 50 MG tablet, Take 1 tablet (50 mg total) by mouth daily., Disp: 90 tablet, Rfl: 1 .  Insulin Glargine (LANTUS SOLOSTAR) 100 UNIT/ML Solostar Pen, Inject 68 Units into the skin daily at 10 pm., Disp: , Rfl:  .  levothyroxine (SYNTHROID) 50 MCG tablet, Take 1 tablet by mouth every morning on an empty stomach with water. No food or medications for 30 minutes., Disp: 90 tablet, Rfl: 0 .  metFORMIN (GLUCOPHAGE) 1000 MG tablet, TAKE 1 TABLET BY MOUTH TWICE A DAY, Disp: 180 tablet, Rfl: 1 .  NOVOLOG FLEXPEN 100 UNIT/ML FlexPen, Inject 20 units before breakfast and lunch, inject 22 units before supper. Increase when blood sugar is over 150. Use 2 units more for every 50 over., Disp: , Rfl: 9 .  ramipril (ALTACE) 10 MG capsule, TAKE 1 CAPSULE BY MOUTH 2 TIMES DAILY, Disp: 180 capsule, Rfl: 1 .  ranitidine (ZANTAC) 150 MG tablet, Take 150 mg by mouth 2 (two) times daily., Disp: , Rfl:    Allergies:  Atorvastatin and Other   Review of Systems:  Constitutional: Feels well. Cardiovascular: Denies chest pain, exertional chest pain.  Pulmonary: Denies hemoptysis, pleuritic chest pain.   The remainder of systems were reviewed and were found to be negative other than what is documented in the HPI.    Physical Examination:   VS: BP 140/60 (BP Location: Left Arm, Cuff Size: Normal)   Pulse 82   Ht 5\' 10"  (1.778 m)   Wt (!) 312 lb 3.2 oz (141.6 kg)   SpO2 98%   BMI 44.80 kg/m   General Appearance: No distress  Neuro:without focal findings, mental  status, speech normal, alert and oriented HEENT: PERRLA, EOM intact Pulmonary: No wheezing, No rales  CardiovascularNormal S1,S2.  No m/r/g.  Abdomen: Benign, Soft, non-tender, No masses Renal:  No costovertebral tenderness  GU:  No performed at this time. Endoc: No evident thyromegaly, no signs of acromegaly or Cushing features Skin:   warm, no rashes, no ecchymosis  Extremities: normal, no cyanosis, clubbing.       LABORATORY PANEL:   CBC No results for input(s): WBC, HGB, HCT, PLT in the last 168 hours. ------------------------------------------------------------------------------------------------------------------  Chemistries  No results for input(s): NA, K, CL, CO2, GLUCOSE, BUN, CREATININE, CALCIUM, MG, AST, ALT, ALKPHOS, BILITOT in the last 168 hours.  Invalid input(s): GFRCGP ------------------------------------------------------------------------------------------------------------------  Cardiac Enzymes No results for  input(s): TROPONINI in the last 168 hours. ------------------------------------------------------------  RADIOLOGY:  No results found.     Thank  you for the consultation and for allowing [facility] to assist in the care of your patient. Our recommendations are noted above.  Please contact us if we can be of further service.  Marda Stalker, M.D., F.C.C.P.  Board Certified in Internal Medicine, Pulmonary Medicine, [place], and Sleep Medicine.  [facility] Office Number: [phone]  07/24/2018

## 2018-07-25 ENCOUNTER — Ambulatory Visit (INDEPENDENT_AMBULATORY_CARE_PROVIDER_SITE_OTHER): Payer: PPO | Admitting: Internal Medicine

## 2018-07-25 ENCOUNTER — Encounter: Payer: Self-pay | Admitting: Internal Medicine

## 2018-07-25 VITALS — BP 140/60 | HR 82 | Ht 70.0 in | Wt 312.2 lb

## 2018-07-25 DIAGNOSIS — J449 Chronic obstructive pulmonary disease, unspecified: Secondary | ICD-10-CM

## 2018-07-25 DIAGNOSIS — G4733 Obstructive sleep apnea (adult) (pediatric): Secondary | ICD-10-CM | POA: Diagnosis not present

## 2018-07-25 DIAGNOSIS — J069 Acute upper respiratory infection, unspecified: Secondary | ICD-10-CM

## 2018-07-25 MED ORDER — ALBUTEROL SULFATE HFA 108 (90 BASE) MCG/ACT IN AERS: 2.0000 | INHALATION_SPRAY | RESPIRATORY_TRACT | 0 refills | Status: DC | PRN

## 2018-07-25 NOTE — Patient Instructions (Signed)
Continue to use CPAP every night.  

## 2018-07-26 ENCOUNTER — Telehealth: Payer: Self-pay | Admitting: Internal Medicine

## 2018-07-26 NOTE — Telephone Encounter (Signed)
Corene Cornea with [facility] is calling to address order placed to bleed 02 into CPAP machine instead of mask.  Per Corene Cornea at [facility] pt has a heated tubing for CPAP and the Y port is unable to connect due to safety issues with the heated tubing and bleeding the 02 into machine.  Would not be safe.    If you want the 02 bled into the CPAP the patient would loose his heated tubing and would have to go with the regular non heated tubing.  This may be an issue for the patient if he likes this feature.  Please advise as to what you would prefer [facility] to do. Thanks. Rhonda J Cobb

## 2018-07-29 NOTE — Telephone Encounter (Signed)
Please inform patient of [facility] response regarding changing his oxygen so that it is bled into the machine directly. They can not do this if he wants to continue with his heated tubing due to a safety issue. So if he would like to continue with heated tubing he must continue with oxygen set up as-is.

## 2018-07-30 NOTE — Telephone Encounter (Signed)
Spoke with patient and advised patient of what [facility] stated. Pt likes the heated tubing and doesn't want to do without this. Pt is aware that 02 will be unable to be bled through the device with this tubing due to safety issues.    Pt stated that he would continue to have 02 bled through the mask. Pt advised that the order to bleed 02 into device has been canceled and pt is aware to contact us if he has any issues. Nothing else needed at this time. Order has been canceled and Jiles Crocker with [facility] has been notified. Rhonda J Cobb

## 2018-08-05 ENCOUNTER — Other Ambulatory Visit: Payer: Self-pay | Admitting: Primary Care

## 2018-08-05 DIAGNOSIS — E039 Hypothyroidism, unspecified: Secondary | ICD-10-CM

## 2018-08-05 DIAGNOSIS — E038 Other specified hypothyroidism: Secondary | ICD-10-CM

## 2018-08-06 DIAGNOSIS — G4733 Obstructive sleep apnea (adult) (pediatric): Secondary | ICD-10-CM | POA: Diagnosis not present

## 2018-08-13 DIAGNOSIS — G4733 Obstructive sleep apnea (adult) (pediatric): Secondary | ICD-10-CM | POA: Diagnosis not present

## 2018-08-13 DIAGNOSIS — J449 Chronic obstructive pulmonary disease, unspecified: Secondary | ICD-10-CM | POA: Diagnosis not present

## 2018-08-13 DIAGNOSIS — R0902 Hypoxemia: Secondary | ICD-10-CM | POA: Diagnosis not present

## 2018-08-22 ENCOUNTER — Encounter: Payer: Self-pay | Admitting: Podiatry

## 2018-08-22 ENCOUNTER — Other Ambulatory Visit: Payer: Self-pay

## 2018-08-22 ENCOUNTER — Ambulatory Visit: Payer: PPO | Admitting: Podiatry

## 2018-08-22 DIAGNOSIS — L84 Corns and callosities: Secondary | ICD-10-CM

## 2018-08-22 DIAGNOSIS — E119 Type 2 diabetes mellitus without complications: Secondary | ICD-10-CM | POA: Diagnosis not present

## 2018-08-22 DIAGNOSIS — B351 Tinea unguium: Secondary | ICD-10-CM | POA: Diagnosis not present

## 2018-08-22 DIAGNOSIS — M79675 Pain in left toe(s): Secondary | ICD-10-CM

## 2018-08-22 DIAGNOSIS — M79674 Pain in right toe(s): Secondary | ICD-10-CM

## 2018-08-22 NOTE — Patient Instructions (Signed)

## 2018-08-22 NOTE — Progress Notes (Signed)
Subjective: Patient presents today for preventative diabetic foot care.  His wife is present during the visit.  He is seen today for chronic, painful mycotic toenails 1 through 5 bilaterally and painful callus noted on the bottom of the right foot.  He voices no new pedal concerns on today's visit.  His symptoms resolve with periodic professional debridement.     Pleas Koch, NP is his PCP.  Last visit was June 11, 2018.   Current Outpatient Medications:  .  albuterol (PROVENTIL HFA;VENTOLIN HFA) 108 (90 Base) MCG/ACT inhaler, Inhale 2 puffs into the lungs every 4 (four) hours as needed for wheezing or shortness of breath., Disp: 1 Inhaler, Rfl: 0 .  aspirin EC 81 MG tablet, Take 81 mg by mouth daily., Disp: , Rfl:  .  buPROPion (WELLBUTRIN SR) 150 MG 12 hr tablet, bupropion HCl SR 150 mg tablet,12 hr sustained-release, Disp: , Rfl:  .  cetirizine (ZYRTEC) 10 MG tablet, Take by mouth., Disp: , Rfl:  .  dexamethasone (DECADRON) 4 MG tablet, dexamethasone 4 mg tablet, Disp: , Rfl:  .  etodolac (LODINE) 500 MG tablet, etodolac 500 mg tablet, Disp: , Rfl:  .  Exenatide ER (BYDUREON) 2 MG SRER, Bydureon 2 mg subcutaneous extended release suspension, Disp: , Rfl:  .  fenofibrate (TRICOR) 145 MG tablet, Take 145 mg by mouth daily., Disp: , Rfl:  .  fluticasone (FLONASE) 50 MCG/ACT nasal spray, Place 1 spray into both nostrils 2 (two) times daily as needed for allergies or rhinitis., Disp: 16 g, Rfl: 0 .  glipiZIDE (GLUCOTROL XL) 5 MG 24 hr tablet, glipizide ER 5 mg tablet, extended release 24 hr, Disp: , Rfl:  .  glucosamine-chondroitin 500-400 MG tablet, Take 2 tablets by mouth daily., Disp: , Rfl:  .  hydrochlorothiazide (HYDRODIURIL) 50 MG tablet, Take 1 tablet (50 mg total) by mouth daily., Disp: 90 tablet, Rfl: 1 .  HYDROcodone-acetaminophen (NORCO) 7.5-325 MG tablet, hydrocodone 7.5 mg-acetaminophen 325 mg tablet, Disp: , Rfl:  .  Insulin Degludec (TRESIBA FLEXTOUCH) 200 UNIT/ML SOPN,  Inject 78 Units into the skin daily., Disp: , Rfl:  .  levothyroxine (SYNTHROID, LEVOTHROID) 50 MCG tablet, TAKE 1 TABLET BY MOUTH ONCE A DAY. TAKE ON AN EMPTY STOMACH WITH A GLASS OF WATER ATLEAST 30-60 MIN BEFORE BREAKFAST, Disp: 90 tablet, Rfl: 1 .  meloxicam (MOBIC) 15 MG tablet, meloxicam 15 mg tablet, Disp: , Rfl:  .  metFORMIN (GLUCOPHAGE) 1000 MG tablet, TAKE 1 TABLET BY MOUTH TWICE A DAY, Disp: 180 tablet, Rfl: 1 .  montelukast (SINGULAIR) 10 MG tablet, montelukast 10 mg tablet, Disp: , Rfl:  .  NOVOLOG FLEXPEN 100 UNIT/ML FlexPen, Inject 20 units before breakfast and lunch, inject 22 units before supper. Increase when blood sugar is over 150. Use 2 units more for every 50 over., Disp: , Rfl: 9 .  ramipril (ALTACE) 10 MG capsule, TAKE 1 CAPSULE BY MOUTH 2 TIMES DAILY, Disp: 180 capsule, Rfl: 1 .  sitaGLIPtin (JANUVIA) 100 MG tablet, Januvia 100 mg tablet, Disp: , Rfl:    Allergies  Allergen Reactions  . Atorvastatin     Other reaction(s): Headache  . Other Other (See Comments)    STATIN DRUG-CAUSED MUSCLE PAIN/LETHARGIC     Objective:  Vascular Examination: Capillary refill time <3 seconds x 10 digits Dorsalis pedis pulses palpable b/l Posterior tibial pulses present bilaterally No digital hair x 10 digits Skin temperature gradient within normal limits bilaterally He does have bilateral lower extremity edema with dependent  rubor.  Dermatological Examination: Skin with normal turgor texture and tone bilaterally  Toenails 1-5 b/l discolored, thick, dystrophic with subungual debris and pain with palpation to nailbeds due to thickness of nails.  Porokeratotic lesion noted submetatarsal head 3 of the right foot.  No surrounding erythema, no erythema, no drainage, no flocculence noted.  Hyperkeratotic lesion noted submetatarsal head 5 right foo.  tNo surrounding erythema, no erythema, no drainage, no flocculence noted.   Musculoskeletal: Muscle strength 5/5 to all LE muscle  groups  Plantar flexed metatarsal heads noted bilaterally  Hammertoes 2 through 5 bilaterally  Neurological: Sensation intact with 10 gram monofilament. Vibratory sensation intact.  Assessment: 1. Painful onychomycosis toenails 1-5 b/l 2. Porokeratosis submetatarsal head 3 right foot 3. Callus submetatarsal head 5 right foot 4. NIDDM  Plan: 1. Toenails 1-5 b/l were debrided in length and girth without iatrogenic bleeding. 2. Porokeratotic lesion pared and enucleated submetatarsal head 3 right foot utilizing sterile scalpel blade without complication. 3. Callus pared submetatarsal head 5 right foot utilizing sterile scalpel blade without complication. 4. Patient to continue soft, supportive shoe gear 5. Patient to report any pedal injuries to medical professional  6. Follow up 3 months. Patient/POA to call should there be a concern in the interim.

## 2018-08-25 NOTE — Progress Notes (Signed)
Dr. Frederico Hamman T. Alliene Klugh, MD, [facility] [place], [address] Phone: [phone] Fax: [phone]  08/28/2018  Patient: [name], MRN: [id number], DOB: 03-27-1951, 68 y.o.  Primary Physician:  Pleas Koch, NP   Chief Complaint  Patient presents with  . Knee Pain    Bilateral   Subjective:   [name] is a 68 y.o. very pleasant male patient who presents with the following:  The patient presents with an office visit for knee pain.  I saw him once in 10/2017 with some primarily L sided knee pain, and we did an intraarticular injection.  He had previously had Euflexxa, which did not help all that much.  Body mass index is 43.98 kg/m.   He is fairly limited now in terms of his functionality secondary to knee pain.  He had a good response to prior intra-articular injection that I did, and he got relief of symptoms for about 6 months.  He has multiple other medical problems including morbid obesity and insulin-dependent diabetes mellitus. He is having a lot of pain now, and limitation in his function globally.  He is having pain in both of his knees significantly.  Past Medical History, Surgical History, Social History, Family History, Problem List, Medications, and Allergies have been reviewed and updated if relevant.  Patient Active Problem List   Diagnosis Date Noted  . Welcome to Medicare preventive visit 09/25/2017  . Personal history of tobacco use, presenting hazards to health 06/30/2016  . Preventative health care 06/05/2016  . Osteoarthritis of both knees 11/09/2015  . Diabetes mellitus ([place]) 06/14/2015  . Allergic rhinitis 05/28/2015  . Morbid obesity with BMI of 40.0-44.9, adult ([facility]) 05/28/2015  . Chronic constipation 05/28/2015  . Chronic obstructive pulmonary disease ([facility]) 05/28/2015  . Essential (primary) hypertension 05/28/2015  . Hypercholesterolemia 05/28/2015  . Gastro-esophageal reflux  disease without esophagitis 05/28/2015  . Subclinical hypothyroidism 05/28/2015  . Dermatofibroma of left lower leg 04/01/2015  . Former smoker 03/04/2015  . History of colonic polyps 08/20/2014  . Idiopathic localized osteoarthropathy 03/16/2014    Past Medical History:  Diagnosis Date  . Arthritis    knee  . Bronchitis   . Colon polyp 2008  . Diabetes mellitus   . Dyspnea   . Hypercholesteremia   . Hypertension   . Hypothyroidism     Past Surgical History:  Procedure Laterality Date  . COLONOSCOPY  2008, 2016   Dr Bary Castilla  . COLONOSCOPY WITH PROPOFOL N/A 10/03/2017   Procedure: COLONOSCOPY WITH PROPOFOL;  Surgeon: Robert Bellow, MD;  Location: [facility] ENDOSCOPY;  Service: Endoscopy;  Laterality: N/A;  . DERMATOFIBROMA  03/26/15   Procedure: Excision mass left leg;  Surgeon: Robert Bellow, MD;  Location: [facility] ORS;  Service: General;  Laterality: N/A;  . LIPOMA EXCISION N/A 03/26/2015      . TONSILLECTOMY      Social History   Socioeconomic History  . Marital status: Married    Spouse name: Not on file  . Number of children: Not on file  . Years of education: Not on file  . Highest education level: Not on file  Occupational History  . Not on file  Social Needs  . Financial resource strain: Not on file  . Food insecurity:    Worry: Not on file    Inability: Not on file  . Transportation needs:    Medical: Not on file    Non-medical: Not  on file  Tobacco Use  . Smoking status: Former Smoker    Packs/day: 2.00    Years: 35.00    Pack years: 70.00    Types: Cigarettes, E-cigarettes    Last attempt to quit: 12/11/2014    Years since quitting: 3.7  . Smokeless tobacco: Never Used  Substance and Sexual Activity  . Alcohol use: No    Alcohol/week: 0.0 standard drinks  . Drug use: No  . Sexual activity: Not on file  Lifestyle  . Physical activity:    Days per week: Not on file    Minutes per session: Not on file  . Stress: Not on file  Relationships  .  Social connections:    Talks on phone: Not on file    Gets together: Not on file    Attends religious service: Not on file    Active member of club or organization: Not on file    Attends meetings of clubs or organizations: Not on file    Relationship status: Not on file  . Intimate partner violence:    Fear of current or ex partner: Not on file    Emotionally abused: Not on file    Physically abused: Not on file    Forced sexual activity: Not on file  Other Topics Concern  . Not on file  Social History Narrative   Married.   3 children. 2 grandchildren.   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     Family History  Problem Relation Age of Onset  . Stroke Mother   . Diabetes Mother   . Stroke Father   . Diabetes Other   . Other Daughter 25       precancerous colon polyp   . Diabetes Maternal Grandmother   . Diabetes Paternal Grandmother     Allergies  Allergen Reactions  . Atorvastatin     Other reaction(s): Headache  . Other Other (See Comments)    STATIN DRUG-CAUSED MUSCLE PAIN/LETHARGIC    Medication list reviewed and updated in full in [facility].  GEN: No fevers, chills. Nontoxic. Primarily MSK c/o today. MSK: Detailed in the HPI GI: tolerating PO intake without difficulty Neuro: No numbness, parasthesias, or tingling associated. Otherwise the pertinent positives of the ROS are noted above.   Objective:   BP 140/72   Pulse 94   Temp 98.2 F (36.8 C) (Oral)   Ht 5\' 10"  (1.778 m)   Wt (!) 306 lb 8 oz (139 kg)   BMI 43.98 kg/m    GEN: WDWN, NAD, Non-toxic, Alert & Oriented x 3 HEENT: Atraumatic, Normocephalic.  Ears and Nose: No external deformity. EXTR: No clubbing/cyanosis/edema NEURO: marked antalgia PSYCH: Normally interactive. Conversant. Not depressed or anxious appearing.  Calm demeanor.    Bilateral knee exam: The patient lacks 2 degrees of extension bilaterally.  On the left knee he  has 95 degrees of flexion.  100 degrees on the right.  Minimal effusion bilaterally.  ACL, MCL, LCL, and PCL are all stable.  He has notable joint line tenderness medially and laterally.  Pain with flexion pinch and McMurray's without mechanical symptoms.  Minimal movement at the patella.  Notable crepitus.  Radiology: No results found.  Assessment and Plan:   Primary osteoarthritis of both knees  Chronic pain of both knees - Plan: DG Knee 4 Views W/Patella Left, methylPREDNISolone acetate (DEPO-MEDROL) injection 80 mg, methylPREDNISolone acetate (DEPO-MEDROL) injection 80 mg  Morbid obesity with BMI of 40.0-44.9, adult ([facility])  Diabetes mellitus, insulin dependent (IDDM), controlled ([facility])  Plain film weightbearing show bilateral tricompartmental osteoarthritis with bone-on-bone pathology medially and advanced osteoarthritis overall.  Multiple diffuse bone spurs as well as subchondral sclerosis noted.  On his left knee films, he has multiple loose bodies in the intra-articular space. Electronically Signed  By: Owens Loffler, MD On: 08/28/2018 1:34 PM   Advanced knee arthritis bilaterally.  He has done reasonably well with conservative care, and he like to avoid operative intervention for as long as possible if he can.  Truthfully, I am doubtful that anything short of a total knee arthroplasty would offer the patient long-lasting relief.  He has a good understanding of his diabetes, and he checks his blood sugar 4 times daily, and he uses NovoLog 3 times a day.  He has a sliding scale insulin scale to increase his dosing based on his glucose readings.  Knee Injection, RIGHT Date of procedure: 08/28/2018 Patient verbally consented to procedure. Risks (including potential rare risk of infection), benefits, and alternatives explained. Sterilely prepped with Chloraprep. Ethyl cholride used for anesthesia. 8 cc Lidocaine 1% mixed with 2 mL Depo-Medrol 40 mg injected using the anteromedial approach  without difficulty. No complications with procedure and tolerated well. Patient had decreased pain post-injection.  Medication: 2 mL of Depo-Medrol 40 mg, equaling Depo-Medrol 80 mg total  Knee Injection, LEFT Date of procedure: 08/28/2018 Patient verbally consented to procedure. Risks (including potential rare risk of infection), benefits, and alternatives explained. Sterilely prepped with Chloraprep. Ethyl cholride used for anesthesia. 8 cc Lidocaine 1% mixed with 2 mL Depo-Medrol 40 mg injected using the anteromedial approach without difficulty. No complications with procedure and tolerated well. Patient had decreased pain post-injection.  Medication: 2 mL of Depo-Medrol 40 mg, equaling Depo-Medrol 80 mg total  Follow-up: No follow-ups on file.  Meds ordered this encounter  Medications  . methylPREDNISolone acetate (DEPO-MEDROL) injection 80 mg  . methylPREDNISolone acetate (DEPO-MEDROL) injection 80 mg   Orders Placed This Encounter  Procedures  . DG Knee 4 Views W/Patella Left    Signed,  Consuelo Thayne T. Ichael Pullara, MD   Outpatient Encounter Medications as of 08/28/2018  Medication Sig  . albuterol (PROVENTIL HFA;VENTOLIN HFA) 108 (90 Base) MCG/ACT inhaler Inhale 2 puffs into the lungs every 4 (four) hours as needed for wheezing or shortness of breath.  Marland Kitchen aspirin EC 81 MG tablet Take 81 mg by mouth daily.  . fenofibrate (TRICOR) 145 MG tablet Take 145 mg by mouth daily.  Marland Kitchen glucosamine-chondroitin 500-400 MG tablet Take 2 tablets by mouth daily.  . hydrochlorothiazide (HYDRODIURIL) 50 MG tablet Take 1 tablet (50 mg total) by mouth daily.  . Insulin Degludec (TRESIBA FLEXTOUCH) 200 UNIT/ML SOPN Inject 78 Units into the skin daily.  Marland Kitchen levothyroxine (SYNTHROID, LEVOTHROID) 50 MCG tablet TAKE 1 TABLET BY MOUTH ONCE A DAY. TAKE ON AN EMPTY STOMACH WITH A GLASS OF WATER ATLEAST 30-60 MIN BEFORE BREAKFAST  . metFORMIN (GLUCOPHAGE) 1000 MG tablet TAKE 1 TABLET BY MOUTH TWICE A DAY  . NOVOLOG  FLEXPEN 100 UNIT/ML FlexPen Inject 28 units before breakfast, inject 20 units beforelunch, inject 28 units before supper. Increase when blood sugar is over 150. Use 2 units more for every 50 over.  . ramipril (ALTACE) 10 MG capsule TAKE 1 CAPSULE BY MOUTH 2 TIMES DAILY  . [DISCONTINUED] buPROPion (WELLBUTRIN SR) 150 MG 12 hr tablet bupropion HCl SR 150 mg tablet,12 hr sustained-release  . [DISCONTINUED] cetirizine (ZYRTEC)  10 MG tablet Take by mouth.  . [DISCONTINUED] dexamethasone (DECADRON) 4 MG tablet dexamethasone 4 mg tablet  . [DISCONTINUED] etodolac (LODINE) 500 MG tablet etodolac 500 mg tablet  . [DISCONTINUED] Exenatide ER (BYDUREON) 2 MG SRER Bydureon 2 mg subcutaneous extended release suspension  . [DISCONTINUED] fluticasone (FLONASE) 50 MCG/ACT nasal spray Place 1 spray into both nostrils 2 (two) times daily as needed for allergies or rhinitis.  . [DISCONTINUED] glipiZIDE (GLUCOTROL XL) 5 MG 24 hr tablet glipizide ER 5 mg tablet, extended release 24 hr  . [DISCONTINUED] HYDROcodone-acetaminophen (NORCO) 7.5-325 MG tablet hydrocodone 7.5 mg-acetaminophen 325 mg tablet  . [DISCONTINUED] meloxicam (MOBIC) 15 MG tablet meloxicam 15 mg tablet  . [DISCONTINUED] montelukast (SINGULAIR) 10 MG tablet montelukast 10 mg tablet  . [DISCONTINUED] sitaGLIPtin (JANUVIA) 100 MG tablet Januvia 100 mg tablet  . [EXPIRED] methylPREDNISolone acetate (DEPO-MEDROL) injection 80 mg   . [EXPIRED] methylPREDNISolone acetate (DEPO-MEDROL) injection 80 mg    No facility-administered encounter medications on file as of 08/28/2018.

## 2018-08-28 ENCOUNTER — Ambulatory Visit (INDEPENDENT_AMBULATORY_CARE_PROVIDER_SITE_OTHER): Payer: PPO | Admitting: Family Medicine

## 2018-08-28 ENCOUNTER — Encounter: Payer: Self-pay | Admitting: Family Medicine

## 2018-08-28 ENCOUNTER — Ambulatory Visit (INDEPENDENT_AMBULATORY_CARE_PROVIDER_SITE_OTHER)
Admission: RE | Admit: 2018-08-28 | Discharge: 2018-08-28 | Disposition: A | Discharge status: Home / Self Care | Payer: PPO | Source: Ambulatory Visit | Attending: Family Medicine | Admitting: Family Medicine

## 2018-08-28 VITALS — BP 140/72 | HR 94 | Temp 98.2°F | Ht 70.0 in | Wt 306.5 lb

## 2018-08-28 DIAGNOSIS — M25562 Pain in left knee: Secondary | ICD-10-CM

## 2018-08-28 DIAGNOSIS — E119 Type 2 diabetes mellitus without complications: Secondary | ICD-10-CM

## 2018-08-28 DIAGNOSIS — IMO0001 Reserved for inherently not codable concepts without codable children: Secondary | ICD-10-CM

## 2018-08-28 DIAGNOSIS — M25561 Pain in right knee: Secondary | ICD-10-CM

## 2018-08-28 DIAGNOSIS — M1712 Unilateral primary osteoarthritis, left knee: Secondary | ICD-10-CM | POA: Diagnosis not present

## 2018-08-28 DIAGNOSIS — Z794 Long term (current) use of insulin: Secondary | ICD-10-CM

## 2018-08-28 DIAGNOSIS — G8929 Other chronic pain: Secondary | ICD-10-CM

## 2018-08-28 DIAGNOSIS — Z6841 Body Mass Index (BMI) 40.0 and over, adult: Secondary | ICD-10-CM

## 2018-08-28 DIAGNOSIS — M170 Bilateral primary osteoarthritis of knee: Secondary | ICD-10-CM | POA: Diagnosis not present

## 2018-08-28 MED ORDER — METHYLPREDNISOLONE ACETATE 40 MG/ML IJ SUSP
80.0000 mg | Freq: Once | INTRAMUSCULAR | Status: AC
  Administered 2018-08-28: 80 mg via INTRA_ARTICULAR

## 2018-09-06 DIAGNOSIS — G4733 Obstructive sleep apnea (adult) (pediatric): Secondary | ICD-10-CM | POA: Diagnosis not present

## 2018-09-13 DIAGNOSIS — G4733 Obstructive sleep apnea (adult) (pediatric): Secondary | ICD-10-CM | POA: Diagnosis not present

## 2018-09-13 DIAGNOSIS — R0902 Hypoxemia: Secondary | ICD-10-CM | POA: Diagnosis not present

## 2018-09-13 DIAGNOSIS — J449 Chronic obstructive pulmonary disease, unspecified: Secondary | ICD-10-CM | POA: Diagnosis not present

## 2018-09-23 DIAGNOSIS — G4733 Obstructive sleep apnea (adult) (pediatric): Secondary | ICD-10-CM | POA: Diagnosis not present

## 2018-09-23 DIAGNOSIS — J449 Chronic obstructive pulmonary disease, unspecified: Secondary | ICD-10-CM | POA: Diagnosis not present

## 2018-09-23 DIAGNOSIS — R0902 Hypoxemia: Secondary | ICD-10-CM | POA: Diagnosis not present

## 2018-10-01 DIAGNOSIS — E1165 Type 2 diabetes mellitus with hyperglycemia: Secondary | ICD-10-CM | POA: Diagnosis not present

## 2018-10-08 DIAGNOSIS — Z789 Other specified health status: Secondary | ICD-10-CM | POA: Diagnosis not present

## 2018-10-08 DIAGNOSIS — Z794 Long term (current) use of insulin: Secondary | ICD-10-CM | POA: Diagnosis not present

## 2018-10-08 DIAGNOSIS — M791 Myalgia, unspecified site: Secondary | ICD-10-CM | POA: Diagnosis not present

## 2018-10-08 DIAGNOSIS — E1142 Type 2 diabetes mellitus with diabetic polyneuropathy: Secondary | ICD-10-CM | POA: Diagnosis not present

## 2018-10-08 DIAGNOSIS — E669 Obesity, unspecified: Secondary | ICD-10-CM | POA: Diagnosis not present

## 2018-10-08 DIAGNOSIS — E1169 Type 2 diabetes mellitus with other specified complication: Secondary | ICD-10-CM | POA: Diagnosis not present

## 2018-10-24 DIAGNOSIS — E1169 Type 2 diabetes mellitus with other specified complication: Secondary | ICD-10-CM | POA: Diagnosis not present

## 2018-10-24 DIAGNOSIS — E1142 Type 2 diabetes mellitus with diabetic polyneuropathy: Secondary | ICD-10-CM | POA: Diagnosis not present

## 2018-10-24 DIAGNOSIS — L989 Disorder of the skin and subcutaneous tissue, unspecified: Secondary | ICD-10-CM | POA: Diagnosis not present

## 2018-10-24 DIAGNOSIS — E669 Obesity, unspecified: Secondary | ICD-10-CM | POA: Diagnosis not present

## 2018-10-24 DIAGNOSIS — Z794 Long term (current) use of insulin: Secondary | ICD-10-CM | POA: Diagnosis not present

## 2018-10-24 DIAGNOSIS — E11649 Type 2 diabetes mellitus with hypoglycemia without coma: Secondary | ICD-10-CM | POA: Diagnosis not present

## 2018-11-04 ENCOUNTER — Other Ambulatory Visit: Payer: Self-pay | Admitting: Primary Care

## 2018-11-04 DIAGNOSIS — E039 Hypothyroidism, unspecified: Secondary | ICD-10-CM

## 2018-11-04 DIAGNOSIS — E038 Other specified hypothyroidism: Secondary | ICD-10-CM

## 2018-11-12 DIAGNOSIS — J449 Chronic obstructive pulmonary disease, unspecified: Secondary | ICD-10-CM | POA: Diagnosis not present

## 2018-11-12 DIAGNOSIS — G4733 Obstructive sleep apnea (adult) (pediatric): Secondary | ICD-10-CM | POA: Diagnosis not present

## 2018-11-12 DIAGNOSIS — R0902 Hypoxemia: Secondary | ICD-10-CM | POA: Diagnosis not present

## 2018-11-13 DIAGNOSIS — G4733 Obstructive sleep apnea (adult) (pediatric): Secondary | ICD-10-CM | POA: Diagnosis not present

## 2018-11-13 DIAGNOSIS — J449 Chronic obstructive pulmonary disease, unspecified: Secondary | ICD-10-CM | POA: Diagnosis not present

## 2018-11-21 ENCOUNTER — Ambulatory Visit: Payer: PPO | Admitting: Podiatry

## 2018-12-12 DIAGNOSIS — R0902 Hypoxemia: Secondary | ICD-10-CM | POA: Diagnosis not present

## 2018-12-12 DIAGNOSIS — J449 Chronic obstructive pulmonary disease, unspecified: Secondary | ICD-10-CM | POA: Diagnosis not present

## 2018-12-12 DIAGNOSIS — G4733 Obstructive sleep apnea (adult) (pediatric): Secondary | ICD-10-CM | POA: Diagnosis not present

## 2018-12-13 DIAGNOSIS — G4733 Obstructive sleep apnea (adult) (pediatric): Secondary | ICD-10-CM | POA: Diagnosis not present

## 2018-12-13 DIAGNOSIS — J449 Chronic obstructive pulmonary disease, unspecified: Secondary | ICD-10-CM | POA: Diagnosis not present

## 2019-01-07 DIAGNOSIS — E1142 Type 2 diabetes mellitus with diabetic polyneuropathy: Secondary | ICD-10-CM | POA: Diagnosis not present

## 2019-01-12 DIAGNOSIS — R0902 Hypoxemia: Secondary | ICD-10-CM | POA: Diagnosis not present

## 2019-01-12 DIAGNOSIS — J449 Chronic obstructive pulmonary disease, unspecified: Secondary | ICD-10-CM | POA: Diagnosis not present

## 2019-01-12 DIAGNOSIS — G4733 Obstructive sleep apnea (adult) (pediatric): Secondary | ICD-10-CM | POA: Diagnosis not present

## 2019-01-14 DIAGNOSIS — Z794 Long term (current) use of insulin: Secondary | ICD-10-CM | POA: Diagnosis not present

## 2019-01-14 DIAGNOSIS — E1169 Type 2 diabetes mellitus with other specified complication: Secondary | ICD-10-CM | POA: Diagnosis not present

## 2019-01-14 DIAGNOSIS — I10 Essential (primary) hypertension: Secondary | ICD-10-CM | POA: Diagnosis not present

## 2019-01-14 DIAGNOSIS — E1142 Type 2 diabetes mellitus with diabetic polyneuropathy: Secondary | ICD-10-CM | POA: Diagnosis not present

## 2019-01-14 DIAGNOSIS — Z789 Other specified health status: Secondary | ICD-10-CM | POA: Diagnosis not present

## 2019-01-14 DIAGNOSIS — E669 Obesity, unspecified: Secondary | ICD-10-CM | POA: Diagnosis not present

## 2019-02-03 ENCOUNTER — Telehealth: Payer: Self-pay | Admitting: Internal Medicine

## 2019-02-03 NOTE — Telephone Encounter (Signed)
Received oxygen re certification from [employer].  Per pt's chart, it appears that pt only wears night time oxygen. I have spoken to pt and verified this information. Pt stated that he does not wear daytime oxygen.  Lm for [place] with [employer] at [phone] [id number]

## 2019-02-04 ENCOUNTER — Other Ambulatory Visit: Payer: Self-pay | Admitting: Primary Care

## 2019-02-04 DIAGNOSIS — E039 Hypothyroidism, unspecified: Secondary | ICD-10-CM

## 2019-02-04 DIAGNOSIS — E038 Other specified hypothyroidism: Secondary | ICD-10-CM

## 2019-02-04 NOTE — Telephone Encounter (Signed)
lmtcb x2 for [place] with [employer].

## 2019-02-05 NOTE — Telephone Encounter (Signed)
Left message for Joanne

## 2019-02-05 NOTE — Telephone Encounter (Signed)
Mechele Claude returned call. [phone] ext [phone]

## 2019-02-05 NOTE — Telephone Encounter (Signed)
Lm x3 for [place] with [employer].

## 2019-02-06 NOTE — Telephone Encounter (Signed)
Called and spoke to [place] with [employer] and relayed below information regarding night time oxygen only.  Mechele Claude requested that last office note be faxed to [phone]. Note has been faxed to provided fax number.  Nothing further is needed at this time.

## 2019-02-11 DIAGNOSIS — R0902 Hypoxemia: Secondary | ICD-10-CM | POA: Diagnosis not present

## 2019-02-11 DIAGNOSIS — G4733 Obstructive sleep apnea (adult) (pediatric): Secondary | ICD-10-CM | POA: Diagnosis not present

## 2019-02-11 DIAGNOSIS — J449 Chronic obstructive pulmonary disease, unspecified: Secondary | ICD-10-CM | POA: Diagnosis not present

## 2019-02-12 ENCOUNTER — Telehealth: Payer: Self-pay | Admitting: Internal Medicine

## 2019-02-12 NOTE — Telephone Encounter (Signed)
Called and spoke with patient and advised patient that we received the same letter that he did.  Advised patient that when Darlina Guys returned from vacation on Monday February 17, 2019,  that we will send this information to her to send to the billing department to handle.    Also advised patient that if he received another letter, to contact our office.  Pt voiced understanding.  Nothing else needed at this time. Rhonda J Cobb

## 2019-02-20 ENCOUNTER — Encounter: Payer: Self-pay | Admitting: General Surgery

## 2019-03-07 ENCOUNTER — Ambulatory Visit: Payer: PPO | Admitting: Podiatry

## 2019-03-07 ENCOUNTER — Encounter: Payer: Self-pay | Admitting: Podiatry

## 2019-03-07 ENCOUNTER — Other Ambulatory Visit: Payer: Self-pay

## 2019-03-07 VITALS — Temp 97.3°F

## 2019-03-07 DIAGNOSIS — B351 Tinea unguium: Secondary | ICD-10-CM

## 2019-03-07 DIAGNOSIS — M79674 Pain in right toe(s): Secondary | ICD-10-CM

## 2019-03-07 DIAGNOSIS — R234 Changes in skin texture: Secondary | ICD-10-CM

## 2019-03-07 DIAGNOSIS — E1149 Type 2 diabetes mellitus with other diabetic neurological complication: Secondary | ICD-10-CM

## 2019-03-07 DIAGNOSIS — M79675 Pain in left toe(s): Secondary | ICD-10-CM | POA: Diagnosis not present

## 2019-03-07 DIAGNOSIS — L84 Corns and callosities: Secondary | ICD-10-CM | POA: Diagnosis not present

## 2019-03-07 NOTE — Patient Instructions (Signed)
Diabetes Mellitus and Foot Care Foot care is an important part of your health, especially when you have diabetes. Diabetes may cause you to have problems because of poor blood flow (circulation) to your feet and legs, which can cause your skin to:  Become thinner and drier.  Break more easily.  Heal more slowly.  Peel and crack. You may also have nerve damage (neuropathy) in your legs and feet, causing decreased feeling in them. This means that you may not notice minor injuries to your feet that could lead to more serious problems. Noticing and addressing any potential problems early is the best way to prevent future foot problems. How to care for your feet Foot hygiene  Wash your feet daily with warm water and mild soap. Do not use hot water. Then, pat your feet and the areas between your toes until they are completely dry. Do not soak your feet as this can dry your skin.  Trim your toenails straight across. Do not dig under them or around the cuticle. File the edges of your nails with an emery board or nail file.  Apply a moisturizing lotion or petroleum jelly to the skin on your feet and to dry, brittle toenails. Use lotion that does not contain alcohol and is unscented. Do not apply lotion between your toes. Shoes and socks  Wear clean socks or stockings every day. Make sure they are not too tight. Do not wear knee-high stockings since they may decrease blood flow to your legs.  Wear shoes that fit properly and have enough cushioning. Always look in your shoes before you put them on to be sure there are no objects inside.  To break in new shoes, wear them for just a few hours a day. This prevents injuries on your feet. Wounds, scrapes, corns, and calluses  Check your feet daily for blisters, cuts, bruises, sores, and redness. If you cannot see the bottom of your feet, use a mirror or ask someone for help.  Do not cut corns or calluses or try to remove them with medicine.  If you  find a minor scrape, cut, or break in the skin on your feet, keep it and the skin around it clean and dry. You may clean these areas with mild soap and water. Do not clean the area with peroxide, alcohol, or iodine.  If you have a wound, scrape, corn, or callus on your foot, look at it several times a day to make sure it is healing and not infected. Check for: ? Redness, swelling, or pain. ? Fluid or blood. ? Warmth. ? Pus or a bad smell. General instructions  Do not cross your legs. This may decrease blood flow to your feet.  Do not use heating pads or hot water bottles on your feet. They may burn your skin. If you have lost feeling in your feet or legs, you may not know this is happening until it is too late.  Protect your feet from hot and cold by wearing shoes, such as at the beach or on hot pavement.  Schedule a complete foot exam at least once a year (annually) or more often if you have foot problems. If you have foot problems, report any cuts, sores, or bruises to your health care provider immediately. Contact a health care provider if:  You have a medical condition that increases your risk of infection and you have any cuts, sores, or bruises on your feet.  You have an injury that is not   healing.  You have redness on your legs or feet.  You feel burning or tingling in your legs or feet.  You have pain or cramps in your legs and feet.  Your legs or feet are numb.  Your feet always feel cold.  You have pain around a toenail. Get help right away if:  You have a wound, scrape, corn, or callus on your foot and: ? You have pain, swelling, or redness that gets worse. ? You have fluid or blood coming from the wound, scrape, corn, or callus. ? Your wound, scrape, corn, or callus feels warm to the touch. ? You have pus or a bad smell coming from the wound, scrape, corn, or callus. ? You have a fever. ? You have a red line going up your leg. Summary  Check your feet every day  for cuts, sores, red spots, swelling, and blisters.  Moisturize feet and legs daily.  Wear shoes that fit properly and have enough cushioning.  If you have foot problems, report any cuts, sores, or bruises to your health care provider immediately.  Schedule a complete foot exam at least once a year (annually) or more often if you have foot problems. This information is not intended to replace advice given to you by your health care provider. Make sure you discuss any questions you have with your health care provider. Document Released: 06/30/2000 Document Revised: 08/15/2017 Document Reviewed: 08/04/2016 Elsevier Patient Education  2020 [employer]   Onychomycosis/Fungal Toenails  WHAT IS IT? An infection that lies within the keratin of your nail plate that is caused by a fungus.  WHY ME? Fungal infections affect all ages, sexes, races, and creeds.  There may be many factors that predispose you to a fungal infection such as age, coexisting medical conditions such as diabetes, or an autoimmune disease; stress, medications, fatigue, genetics, etc.  Bottom line: fungus thrives in a warm, moist environment and your shoes offer such a location.  IS IT CONTAGIOUS? Theoretically, yes.  You do not want to share shoes, nail clippers or files with someone who has fungal toenails.  Walking around barefoot in the same room or sleeping in the same bed is unlikely to transfer the organism.  It is important to realize, however, that fungus can spread easily from one nail to the next on the same foot.  HOW DO WE TREAT THIS?  There are several ways to treat this condition.  Treatment may depend on many factors such as age, medications, pregnancy, liver and kidney conditions, etc.  It is best to ask your doctor which options are available to you.  1. No treatment.   Unlike many other medical concerns, you can live with this condition.  However for many people this can be a painful condition and may lead to  ingrown toenails or a bacterial infection.  It is recommended that you keep the nails cut short to help reduce the amount of fungal nail. 2. Topical treatment.  These range from herbal remedies to prescription strength nail lacquers.  About 40-50% effective, topicals require twice daily application for approximately 9 to 12 months or until an entirely new nail has grown out.  The most effective topicals are medical grade medications available through physicians offices. 3. Oral antifungal medications.  With an 80-90% cure rate, the most common oral medication requires 3 to 4 months of therapy and stays in your system for a year as the new nail grows out.  Oral antifungal medications do require   blood work to make sure it is a safe drug for you.  A liver function panel will be performed prior to starting the medication and after the first month of treatment.  It is important to have the blood work performed to avoid any harmful side effects.  In general, this medication safe but blood work is required. 4. Laser Therapy.  This treatment is performed by applying a specialized laser to the affected nail plate.  This therapy is noninvasive, fast, and non-painful.  It is not covered by insurance and is therefore, out of pocket.  The results have been very good with a 80-95% cure rate.  The [facility] is the only practice in the area to offer this therapy. 5. Permanent Nail Avulsion.  Removing the entire nail so that a new nail will not grow back. 

## 2019-03-14 DIAGNOSIS — R0902 Hypoxemia: Secondary | ICD-10-CM | POA: Diagnosis not present

## 2019-03-14 DIAGNOSIS — G4733 Obstructive sleep apnea (adult) (pediatric): Secondary | ICD-10-CM | POA: Diagnosis not present

## 2019-03-14 DIAGNOSIS — J449 Chronic obstructive pulmonary disease, unspecified: Secondary | ICD-10-CM | POA: Diagnosis not present

## 2019-03-16 NOTE — Progress Notes (Signed)
Subjective: [name] is a 68 y.o. y.o. male who presents today with cc of painful, discolored, thick toenails and calluses which interfere with daily activities. Pain is aggravated when wearing enclosed shoe gear and relieved with periodic professional debridement.  Patient relates painful heel posterior aspect right foot. He states a relative gave him some cream and he's been putting it on the heel. He has noted minimal relief of pain. He denies any redness, swelling, drainage or malodor.   Current Outpatient Medications:  .  albuterol (PROVENTIL HFA;VENTOLIN HFA) 108 (90 Base) MCG/ACT inhaler, Inhale 2 puffs into the lungs every 4 (four) hours as needed for wheezing or shortness of breath., Disp: 1 Inhaler, Rfl: 0 .  aspirin EC 81 MG tablet, Take 81 mg by mouth daily., Disp: , Rfl:  .  fenofibrate (TRICOR) 145 MG tablet, Take 145 mg by mouth daily., Disp: , Rfl:  .  glucosamine-chondroitin 500-400 MG tablet, Take 2 tablets by mouth daily., Disp: , Rfl:  .  hydrochlorothiazide (HYDRODIURIL) 50 MG tablet, Take 1 tablet (50 mg total) by mouth daily., Disp: 90 tablet, Rfl: 1 .  Insulin Degludec (TRESIBA FLEXTOUCH) 200 UNIT/ML SOPN, Inject 78 Units into the skin daily., Disp: , Rfl:  .  levothyroxine (SYNTHROID) 50 MCG tablet, TAKE 1 TABLET BY MOUTH ONCE A DAY ON AN EMPTY STOMACH.TAKE WITH A GLASS OF WATERAT LEAST 30-60 MIN BEFORE BREAKFAST, Disp: 90 tablet, Rfl: 1 .  metFORMIN (GLUCOPHAGE) 1000 MG tablet, TAKE 1 TABLET BY MOUTH TWICE A DAY, Disp: 180 tablet, Rfl: 1 .  NOVOLOG FLEXPEN 100 UNIT/ML FlexPen, Inject 28 units before breakfast, inject 20 units beforelunch, inject 28 units before supper. Increase when blood sugar is over 150. Use 2 units more for every 50 over., Disp: , Rfl: 9 .  ramipril (ALTACE) 10 MG capsule, TAKE 1 CAPSULE BY MOUTH 2 TIMES DAILY, Disp: 180 capsule, Rfl: 1  Allergies  Allergen Reactions  . Atorvastatin     Other reaction(s): Headache  . Other Other (See Comments)     STATIN DRUG-CAUSED MUSCLE PAIN/LETHARGIC    Objective: Vitals:   03/07/19 1125  Temp: (!) 97.3 F (36.3 C)    Vascular Examination: Capillary refill time <3 seconds x 10 digits.  Dorsalis pedis pulses and Posterior tibial pulses palpable b/l.  Digital hair absent x 10 digits.  Skin temperature gradient WNL b/l.  Dermatological Examination: Skin with normal turgor, texture and tone b/l.  Toenails 1-5 b/l discolored, thick, dystrophic with subungual debris and pain with palpation to nailbeds due to thickness of nails.  Hyperkeratotic lesions submet head 5 right foot. No erythema, no edema, no drainage, no flocculence noted.   Porokeratotic lesions submet head 3 right foot with tenderness to palpation. No erythema, no edema, no drainage, no flocculence.  Skin crack noted posterior heel with deep hyperkeratosis clinically significant for heel fissure formation. +Tender to palpation. No erythema, no edema, no drainage, no flocculence.   Musculoskeletal: Muscle strength 5/5 to all LE muscle groups.  Neurological: Sensation intact 5/5 b/l with 10 gram monofilament.  Vibratory sensation intact b/l.  Assessment: 1. Painful onychomycosis toenails 1-5 b/l 2.  Callus submet head 5 right foot 3.  Porokeratosis submet head 3 right foot 4.  Heel Fissure right foot 5.  NIDDM  Plan: 1. Continue diabetic foot care principles. Literature dispensed on today. 2. Toenails 1-5 b/l were debrided in length and girth without iatrogenic bleeding. 3. Hyperkeratotic lesion(s) submet head 5 right foot pared with sterile scalpel blade  without incident. 4. Porokeratosis submet head 3 right foot pared and enucleated with sterile scalpel blade without incident.  5. Heel fissure debrided. Cleansed with alcohol. Triple antibiotic ointment and bandaid applied. Patient noted relief after treatment today. He was instructed to continue applying Neosporin Ointment to area once daily. 6. Patient to  continue soft, supportive shoe gear daily. 7. Patient to report any pedal injuries to medical professional immediately. 8. Follow up 3 months.  9. Patient/POA to call should there be a concern in the interim.

## 2019-03-19 DIAGNOSIS — G4733 Obstructive sleep apnea (adult) (pediatric): Secondary | ICD-10-CM | POA: Diagnosis not present

## 2019-03-19 DIAGNOSIS — J449 Chronic obstructive pulmonary disease, unspecified: Secondary | ICD-10-CM | POA: Diagnosis not present

## 2019-04-03 ENCOUNTER — Ambulatory Visit (INDEPENDENT_AMBULATORY_CARE_PROVIDER_SITE_OTHER)
Admission: RE | Admit: 2019-04-03 | Discharge: 2019-04-03 | Disposition: A | Discharge status: Home / Self Care | Payer: PPO | Source: Ambulatory Visit | Attending: Internal Medicine | Admitting: Internal Medicine

## 2019-04-03 ENCOUNTER — Encounter: Payer: Self-pay | Admitting: Internal Medicine

## 2019-04-03 ENCOUNTER — Other Ambulatory Visit: Payer: Self-pay

## 2019-04-03 ENCOUNTER — Ambulatory Visit (INDEPENDENT_AMBULATORY_CARE_PROVIDER_SITE_OTHER): Payer: PPO | Admitting: Internal Medicine

## 2019-04-03 VITALS — BP 144/88 | HR 78 | Temp 97.8°F | Wt 300.0 lb

## 2019-04-03 DIAGNOSIS — T07XXXA Unspecified multiple injuries, initial encounter: Secondary | ICD-10-CM

## 2019-04-03 DIAGNOSIS — R0781 Pleurodynia: Secondary | ICD-10-CM

## 2019-04-03 DIAGNOSIS — M25511 Pain in right shoulder: Secondary | ICD-10-CM | POA: Diagnosis not present

## 2019-04-03 DIAGNOSIS — W28XXXA Contact with powered lawn mower, initial encounter: Secondary | ICD-10-CM

## 2019-04-03 DIAGNOSIS — S299XXA Unspecified injury of thorax, initial encounter: Secondary | ICD-10-CM | POA: Diagnosis not present

## 2019-04-03 DIAGNOSIS — S4991XA Unspecified injury of right shoulder and upper arm, initial encounter: Secondary | ICD-10-CM | POA: Diagnosis not present

## 2019-04-03 NOTE — Progress Notes (Signed)
Subjective:    Patient ID: [name], male    DOB: 18-Dec-1950, 68 y.o.   MRN: [name]  HPI  Pt presents to the clinic today with c/o falling off a lawnmower. He reports he was mowing near a swing set when his arm got caught in the chain and it pulled him off the lawnmower. He reports pain in right shoulder, right side ribs. He reports abrasions to bilateral lower extremities. He has tried using a heating pad to his ribs and Silvadene on his abrasions. He does have a history of DM 2. Tetanus 2016.  Review of Systems      Past Medical History:  Diagnosis Date  . Arthritis    knee  . Bronchitis   . Colon polyp 2008  . Diabetes mellitus   . Dyspnea   . Hypercholesteremia   . Hypertension   . Hypothyroidism     Current Outpatient Medications  Medication Sig Dispense Refill  . albuterol (PROVENTIL HFA;VENTOLIN HFA) 108 (90 Base) MCG/ACT inhaler Inhale 2 puffs into the lungs every 4 (four) hours as needed for wheezing or shortness of breath. 1 Inhaler 0  . aspirin EC 81 MG tablet Take 81 mg by mouth daily.    . fenofibrate (TRICOR) 145 MG tablet Take 145 mg by mouth daily.    Marland Kitchen glucosamine-chondroitin 500-400 MG tablet Take 2 tablets by mouth daily.    . hydrochlorothiazide (HYDRODIURIL) 50 MG tablet Take 1 tablet (50 mg total) by mouth daily. 90 tablet 1  . Insulin Degludec (TRESIBA FLEXTOUCH) 200 UNIT/ML SOPN Inject 78 Units into the skin daily.    Marland Kitchen levothyroxine (SYNTHROID) 50 MCG tablet TAKE 1 TABLET BY MOUTH ONCE A DAY ON AN EMPTY STOMACH.TAKE WITH A GLASS OF WATERAT LEAST 30-60 MIN BEFORE BREAKFAST 90 tablet 1  . metFORMIN (GLUCOPHAGE) 1000 MG tablet TAKE 1 TABLET BY MOUTH TWICE A DAY 180 tablet 1  . NOVOLOG FLEXPEN 100 UNIT/ML FlexPen Inject 28 units before breakfast, inject 20 units beforelunch, inject 28 units before supper. Increase when blood sugar is over 150. Use 2 units more for every 50 over.  9  . ramipril (ALTACE) 10 MG capsule TAKE 1 CAPSULE BY MOUTH 2 TIMES  DAILY 180 capsule 1   No current facility-administered medications for this visit.     Allergies  Allergen Reactions  . Atorvastatin     Other reaction(s): Headache  . Other Other (See Comments)    STATIN DRUG-CAUSED MUSCLE PAIN/LETHARGIC    Family History  Problem Relation Age of Onset  . Stroke Mother   . Diabetes Mother   . Stroke Father   . Diabetes Other   . Other Daughter 25       precancerous colon polyp   . Diabetes Maternal Grandmother   . Diabetes Paternal Grandmother     Social History   Socioeconomic History  . Marital status: Married    Spouse name: Not on file  . Number of children: Not on file  . Years of education: Not on file  . Highest education level: Not on file  Occupational History  . Not on file  Social Needs  . Financial resource strain: Not on file  . Food insecurity    Worry: Not on file    Inability: Not on file  . Transportation needs    Medical: Not on file    Non-medical: Not on file  Tobacco Use  . Smoking status: Former Smoker    Packs/day: 2.00  Years: 35.00    Pack years: 70.00    Types: Cigarettes, E-cigarettes    Quit date: 12/11/2014    Years since quitting: 4.3  . Smokeless tobacco: Never Used  Substance and Sexual Activity  . Alcohol use: No    Alcohol/week: 0.0 standard drinks  . Drug use: No  . Sexual activity: Not on file  Lifestyle  . Physical activity    Days per week: Not on file    Minutes per session: Not on file  . Stress: Not on file  Relationships  . Social Herbalist on phone: Not on file    Gets together: Not on file    Attends religious service: Not on file    Active member of club or organization: Not on file    Attends meetings of clubs or organizations: Not on file    Relationship status: Not on file  . Intimate partner violence    Fear of current or ex partner: Not on file    Emotionally abused: Not on file    Physically abused: Not on file    Forced sexual activity: Not on  file  Other Topics Concern  . Not on file  Social History Narrative   Married.   3 children. 2 grandchildren.   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      Constitutional: Denies fever, malaise, fatigue, headache or abrupt weight changes.  Respiratory: Denies difficulty breathing, shortness of breath, cough or sputum production.   Cardiovascular: Denies chest pain, chest tightness, palpitations or swelling in the hands or feet.  Musculoskeletal: Pt reports right shoulder pain, right side rib pain. Denies decrease in range of motion, difficulty with gait, or joint swelling.  Skin: Pt reports abrasions to bilateral lower extremities. Denies ulcercations.   No other specific complaints in a complete review of systems (except as listed in HPI above).  Objective:   Physical Exam  BP (!) 144/88   Pulse 78   Temp 97.8 F (36.6 C) (Temporal)   Wt 300 lb (136.1 kg)   SpO2 97%   BMI 43.05 kg/m  Wt Readings from Last 3 Encounters:  04/03/19 300 lb (136.1 kg)  08/28/18 (!) 306 lb 8 oz (139 kg)  07/25/18 (!) 312 lb 3.2 oz (141.6 kg)    General: Appears her stated age, obese, in NAD. Skin: Abrasion noted to left lower shin, right lower shin and left posterior thigh. No s/s of ingection. Pulmonary/Chest: Normal effort and positive vesicular breath sounds. No respiratory distress. No wheezes, rales or ronchi noted.  Musculoskeletal: Normal internal and external rotation of the right shoulder, but painful. Pain with palpation over the right AC joint. Negative drop can test. Strength 5/5 BUE. Pain with palpation of the right lateral ribs.   BMET    Component Value Date/Time   NA 142 06/01/2016 0750   NA 139 02/19/2015 0923   K 4.4 06/01/2016 0750   CL 102 06/01/2016 0750   CO2 32 06/01/2016 0750   GLUCOSE 102 (H) 06/01/2016 0750   BUN 15 06/01/2016 0750   BUN 20 02/19/2015 0923   CREATININE 1.07 06/01/2016 0750   CALCIUM 10.1  06/01/2016 0750   GFRNONAA 76 02/19/2015 0923   GFRAA 88 02/19/2015 0923    Lipid Panel     Component Value Date/Time   CHOL 152 06/01/2016 0750   CHOL 151 02/19/2015 0923   TRIG 106.0 06/01/2016 0750  HDL 33.70 (L) 06/01/2016 0750   HDL 42 02/19/2015 0923   CHOLHDL 4 06/01/2016 0750   VLDL 21.2 06/01/2016 0750   LDLCALC 97 06/01/2016 0750   LDLCALC 82 02/19/2015 0923    CBC    Component Value Date/Time   WBC 8.1 02/19/2015 0923   WBC 6.9 01/12/2012 1909   RBC 4.68 02/19/2015 0923   RBC 4.40 01/12/2012 1909   HGB 14.2 02/19/2015 0923   HCT 41.3 02/19/2015 0923   PLT 269 02/19/2015 0923   MCV 88 02/19/2015 0923   MCH 30.3 02/19/2015 0923   MCH 30.9 01/12/2012 1909   MCHC 34.4 02/19/2015 0923   MCHC 34.8 01/12/2012 1909   RDW 13.0 02/19/2015 0923   LYMPHSABS 2.6 02/19/2015 0923   EOSABS 0.2 02/19/2015 0923   BASOSABS 0.0 02/19/2015 0923    Hgb A1C Lab Results  Component Value Date   HGBA1C 7.5 (H) 06/01/2016            Assessment & Plan:   Right Shoulder Pain, Right Side Rib Pain, Multiple Abrasions s/p Lawnmower Accident:  No indication for prophylactic abx at this time Clean wounds with soap and water, cover with Neosporin and Telfa at night, off during the day to allow it to dry out.  Xray right shoulder Xray right side of ribs He declines RX for pain medication, would like to try Ibuprofen OTC Encouraged rest today  Will follow up after xrays, return precautions discussed Webb Silversmith, NP

## 2019-04-03 NOTE — Patient Instructions (Signed)
Wound Care, Adult Taking care of your wound properly can help to prevent pain, infection, and scarring. It can also help your wound to heal more quickly. How to care for your wound Wound care      Follow instructions from your health care provider about how to take care of your wound. Make sure you: ? Wash your hands with soap and water before you change the bandage (dressing). If soap and water are not available, use hand sanitizer. ? Change your dressing as told by your health care provider. ? Leave stitches (sutures), skin glue, or adhesive strips in place. These skin closures may need to stay in place for 2 weeks or longer. If adhesive strip edges start to loosen and curl up, you may trim the loose edges. Do not remove adhesive strips completely unless your health care provider tells you to do that.  Check your wound area every day for signs of infection. Check for: ? Redness, swelling, or pain. ? Fluid or blood. ? Warmth. ? Pus or a bad smell.  Ask your health care provider if you should clean the wound with mild soap and water. Doing this may include: ? Using a clean towel to pat the wound dry after cleaning it. Do not rub or scrub the wound. ? Applying a cream or ointment. Do this only as told by your health care provider. ? Covering the incision with a clean dressing.  Ask your health care provider when you can leave the wound uncovered.  Keep the dressing dry until your health care provider says it can be removed. Do not take baths, swim, use a hot tub, or do anything that would put the wound underwater until your health care provider approves. Ask your health care provider if you can take showers. You may only be allowed to take sponge baths. Medicines   If you were prescribed an antibiotic medicine, cream, or ointment, take or use the antibiotic as told by your health care provider. Do not stop taking or using the antibiotic even if your condition improves.  Take  over-the-counter and prescription medicines only as told by your health care provider. If you were prescribed pain medicine, take it 30 or more minutes before you do any wound care or as told by your health care provider. General instructions  Return to your normal activities as told by your health care provider. Ask your health care provider what activities are safe.  Do not scratch or pick at the wound.  Do not use any products that contain nicotine or tobacco, such as cigarettes and e-cigarettes. These may delay wound healing. If you need help quitting, ask your health care provider.  Keep all follow-up visits as told by your health care provider. This is important.  Eat a diet that includes protein, vitamin A, vitamin C, and other nutrient-rich foods to help the wound heal. ? Foods rich in protein include meat, dairy, beans, nuts, and other sources. ? Foods rich in vitamin A include carrots and dark green, leafy vegetables. ? Foods rich in vitamin C include citrus, tomatoes, and other fruits and vegetables. ? Nutrient-rich foods have protein, carbohydrates, fat, vitamins, or minerals. Eat a variety of healthy foods including vegetables, fruits, and whole grains. Contact a health care provider if:  You received a tetanus shot and you have swelling, severe pain, redness, or bleeding at the injection site.  Your pain is not controlled with medicine.  You have redness, swelling, or pain around the wound.    You have fluid or blood coming from the wound.  Your wound feels warm to the touch.  You have pus or a bad smell coming from the wound.  You have a fever or chills.  You are nauseous or you vomit.  You are dizzy. Get help right away if:  You have a red streak going away from your wound.  The edges of the wound open up and separate.  Your wound is bleeding, and the bleeding does not stop with gentle pressure.  You have a rash.  You faint.  You have trouble breathing.  Summary  Always wash your hands with soap and water before changing your bandage (dressing).  To help with healing, eat foods that are rich in protein, vitamin A, vitamin C, and other nutrients.  Check your wound every day for signs of infection. Contact your health care provider if you suspect that your wound is infected. This information is not intended to replace advice given to you by your health care provider. Make sure you discuss any questions you have with your health care provider. Document Released: 04/11/2008 Document Revised: 10/21/2018 Document Reviewed: 01/18/2016 Elsevier Patient Education  2020 [employer]  

## 2019-04-11 DIAGNOSIS — E1169 Type 2 diabetes mellitus with other specified complication: Secondary | ICD-10-CM | POA: Diagnosis not present

## 2019-04-11 DIAGNOSIS — E669 Obesity, unspecified: Secondary | ICD-10-CM | POA: Diagnosis not present

## 2019-04-14 DIAGNOSIS — G4733 Obstructive sleep apnea (adult) (pediatric): Secondary | ICD-10-CM | POA: Diagnosis not present

## 2019-04-14 DIAGNOSIS — R0902 Hypoxemia: Secondary | ICD-10-CM | POA: Diagnosis not present

## 2019-04-14 DIAGNOSIS — J449 Chronic obstructive pulmonary disease, unspecified: Secondary | ICD-10-CM | POA: Diagnosis not present

## 2019-04-15 ENCOUNTER — Encounter: Payer: Self-pay | Admitting: Primary Care

## 2019-04-15 ENCOUNTER — Other Ambulatory Visit: Payer: Self-pay

## 2019-04-15 ENCOUNTER — Ambulatory Visit (INDEPENDENT_AMBULATORY_CARE_PROVIDER_SITE_OTHER): Payer: PPO | Admitting: Primary Care

## 2019-04-15 VITALS — BP 130/74 | HR 80 | Temp 97.7°F | Ht 70.0 in | Wt 310.0 lb

## 2019-04-15 DIAGNOSIS — E119 Type 2 diabetes mellitus without complications: Secondary | ICD-10-CM

## 2019-04-15 DIAGNOSIS — E7800 Pure hypercholesterolemia, unspecified: Secondary | ICD-10-CM | POA: Diagnosis not present

## 2019-04-15 DIAGNOSIS — Z87891 Personal history of nicotine dependence: Secondary | ICD-10-CM | POA: Diagnosis not present

## 2019-04-15 DIAGNOSIS — M170 Bilateral primary osteoarthritis of knee: Secondary | ICD-10-CM

## 2019-04-15 DIAGNOSIS — Z23 Encounter for immunization: Secondary | ICD-10-CM

## 2019-04-15 DIAGNOSIS — Z86010 Personal history of colonic polyps: Secondary | ICD-10-CM | POA: Diagnosis not present

## 2019-04-15 DIAGNOSIS — E038 Other specified hypothyroidism: Secondary | ICD-10-CM

## 2019-04-15 DIAGNOSIS — E039 Hypothyroidism, unspecified: Secondary | ICD-10-CM | POA: Diagnosis not present

## 2019-04-15 DIAGNOSIS — Z1159 Encounter for screening for other viral diseases: Secondary | ICD-10-CM | POA: Diagnosis not present

## 2019-04-15 DIAGNOSIS — Z0000 Encounter for general adult medical examination without abnormal findings: Secondary | ICD-10-CM

## 2019-04-15 DIAGNOSIS — J449 Chronic obstructive pulmonary disease, unspecified: Secondary | ICD-10-CM | POA: Diagnosis not present

## 2019-04-15 DIAGNOSIS — I10 Essential (primary) hypertension: Secondary | ICD-10-CM | POA: Diagnosis not present

## 2019-04-15 DIAGNOSIS — K219 Gastro-esophageal reflux disease without esophagitis: Secondary | ICD-10-CM

## 2019-04-15 DIAGNOSIS — Z794 Long term (current) use of insulin: Secondary | ICD-10-CM

## 2019-04-15 DIAGNOSIS — K5909 Other constipation: Secondary | ICD-10-CM

## 2019-04-15 DIAGNOSIS — Z125 Encounter for screening for malignant neoplasm of prostate: Secondary | ICD-10-CM | POA: Diagnosis not present

## 2019-04-15 LAB — TSH: TSH: 4.49 u[IU]/mL (ref 0.35–4.50)

## 2019-04-15 LAB — PSA: PSA: 0.88 ng/mL (ref 0.10–4.00)

## 2019-04-15 MED ORDER — ZOSTER VAC RECOMB ADJUVANTED 50 MCG/0.5ML IM SUSR: 0.5000 mL | Freq: Once | INTRAMUSCULAR | 1 refills | Status: AC

## 2019-04-15 NOTE — Patient Instructions (Signed)
Stop by the lab prior to leaving today. I will notify you of your results once received.   Take the shingles vaccination to your pharmacy as discussed.  It is important that you improve your diet. Please limit carbohydrates in the form of white bread, rice, pasta, sweets, fast food, fried food, sugary drinks, etc. Increase your consumption of fresh fruits and vegetables, whole grains, lean protein.  Ensure you are consuming 64 ounces of water daily.  Start exercising. You should be getting 150 minutes of exercise weekly.  Follow up with Dr. Lorelei Pont and Dr. Gabriel Carina as discussed.  It was a pleasure to see you today!   Preventive Care 66 Years and Older, Male Preventive care refers to lifestyle choices and visits with your health care provider that can promote health and wellness. This includes:  A yearly physical exam. This is also called an annual well check.  Regular dental and eye exams.  Immunizations.  Screening for certain conditions.  Healthy lifestyle choices, such as diet and exercise. What can I expect for my preventive care visit? Physical exam Your health care provider will check:  Height and weight. These may be used to calculate body mass index (BMI), which is a measurement that tells if you are at a healthy weight.  Heart rate and blood pressure.  Your skin for abnormal spots. Counseling Your health care provider may ask you questions about:  Alcohol, tobacco, and drug use.  Emotional well-being.  Home and relationship well-being.  Sexual activity.  Eating habits.  History of falls.  Memory and ability to understand (cognition).  Work and work Statistician. What immunizations do I need?  Influenza (flu) vaccine  This is recommended every year. Tetanus, diphtheria, and pertussis (Tdap) vaccine  You may need a Td booster every 10 years. Varicella (chickenpox) vaccine  You may need this vaccine if you have not already been vaccinated. Zoster  (shingles) vaccine  You may need this after age 75. Pneumococcal conjugate (PCV13) vaccine  One dose is recommended after age 66. Pneumococcal polysaccharide (PPSV23) vaccine  One dose is recommended after age 66. Measles, mumps, and rubella (MMR) vaccine  You may need at least one dose of MMR if you were born in 1957 or later. You may also need a second dose. Meningococcal conjugate (MenACWY) vaccine  You may need this if you have certain conditions. Hepatitis A vaccine  You may need this if you have certain conditions or if you travel or work in places where you may be exposed to hepatitis A. Hepatitis B vaccine  You may need this if you have certain conditions or if you travel or work in places where you may be exposed to hepatitis B. Haemophilus influenzae type b (Hib) vaccine  You may need this if you have certain conditions. You may receive vaccines as individual doses or as more than one vaccine together in one shot (combination vaccines). Talk with your health care provider about the risks and benefits of combination vaccines. What tests do I need? Blood tests  Lipid and cholesterol levels. These may be checked every 5 years, or more frequently depending on your overall health.  Hepatitis C test.  Hepatitis B test. Screening  Lung cancer screening. You may have this screening every year starting at age 66 if you have a 30-pack-year history of smoking and currently smoke or have quit within the past 15 years.  Colorectal cancer screening. All adults should have this screening starting at age 66 and continuing until age  66. Your health care provider may recommend screening at age 100 if you are at increased risk. You will have tests every 1-10 years, depending on your results and the type of screening test.  Prostate cancer screening. Recommendations will vary depending on your family history and other risks.  Diabetes screening. This is done by checking your blood sugar  (glucose) after you have not eaten for a while (fasting). You may have this done every 1-3 years.  Abdominal aortic aneurysm (AAA) screening. You may need this if you are a current or former smoker.  Sexually transmitted disease (STD) testing. Follow these instructions at home: Eating and drinking  Eat a diet that includes fresh fruits and vegetables, whole grains, lean protein, and low-fat dairy products. Limit your intake of foods with high amounts of sugar, saturated fats, and salt.  Take vitamin and mineral supplements as recommended by your health care provider.  Do not drink alcohol if your health care provider tells you not to drink.  If you drink alcohol: ? Limit how much you have to 0-2 drinks a day. ? Be aware of how much alcohol is in your drink. In the [place] one drink equals one 12 oz bottle of beer (355 mL), one 5 oz glass of wine (148 mL), or one 1 oz glass of hard liquor (44 mL). Lifestyle  Take daily care of your teeth and gums.  Stay active. Exercise for at least 30 minutes on 5 or more days each week.  Do not use any products that contain nicotine or tobacco, such as cigarettes, e-cigarettes, and chewing tobacco. If you need help quitting, ask your health care provider.  If you are sexually active, practice safe sex. Use a condom or other form of protection to prevent STIs (sexually transmitted infections).  Talk with your health care provider about taking a low-dose aspirin or statin. What's next?  Visit your health care provider once a year for a well check visit.  Ask your health care provider how often you should have your eyes and teeth checked.  Stay up to date on all vaccines. This information is not intended to replace advice given to you by your health care provider. Make sure you discuss any questions you have with your health care provider. Document Released: 07/30/2015 Document Revised: 06/27/2018 Document Reviewed: 06/27/2018 Elsevier Patient  Education  2020 [employer].

## 2019-04-15 NOTE — Assessment & Plan Note (Signed)
Stable in the office today, continue current regimen. Recent BMP reviewed through [place].

## 2019-04-15 NOTE — Assessment & Plan Note (Signed)
Continued, no worse in symptoms. No longer taking glucosamine. He will follow up with Sports Medicine. Continue Voltaren.

## 2019-04-15 NOTE — Assessment & Plan Note (Signed)
Following with endocrinology through [facility], recent labs including A1C reviewed. Continue current regimen.

## 2019-04-15 NOTE — Assessment & Plan Note (Signed)
Taking levothyroxine appropriately. Repeat TSH pending. Continue levothyroxine 50 mcg for now.

## 2019-04-15 NOTE — Assessment & Plan Note (Signed)
Daily bowel movements that are firm. Encouraged to increase vegetables/water.

## 2019-04-15 NOTE — Progress Notes (Signed)
Subjective:    Patient ID: [name], male    DOB: 09/14/1950, 68 y.o.   MRN: [id number]  HPI  [name] is a 68 year old male who presents today for complete physical.   1) Essential Hypertension: Currently managed on HCTZ 50 mg, ramipril 10 mg.  He denies chest pain, SOB, dizziness.  BP Readings from Last 3 Encounters:  04/15/19 130/74  04/03/19 (!) 144/88  08/28/18 140/72   2) Hypothyroidism: Currently managed on levothyroxine 50 mcg daily. His last TSH was 4.07 in December 2019. He is taking his levothyroxine every morning with water only. No food or other medications for 30 minutes. He does not take vitamins or PPI's.   3) Type 2 Diabetes:   Current medications include: Metformin 1000 mg BID, Tresiba 200 units/ml and is injecting 78 units nightly. Novolog 28 units before breakfast and dinner, 20 units before lunch.  He is followed by endocrinology. Last A1C of 8.0 in September 2020.  4) Hyperlipidemia: Currently managed on fenofibrate 145 mg. Last lipid panel was in June 2020 with LDL of 89.  5) COPD/OSA: Currently managed on albuterol HFA and is using infrequently. He is following with pulmonology and is compliant to his CPAP machine nightly.    Immunizations: -Tetanus: Completed in 2016 -Influenza: Due today -Pneumonia:  Completed Prevnar in 2019, Pneumovax in 2017 -Shingles: Never completed  Eye exam: Completed in September 2019, he will schedule Dental exam: No recent exam Colonoscopy: Completed in 2019, due in 2022 PSA: 0.78 in 2017 Hep C Screen: Due Lung Cancer Screening: Due in December 2020   Review of Systems  Constitutional: Negative for unexpected weight change.  HENT: Negative for rhinorrhea.   Respiratory: Negative for cough and shortness of breath.   Cardiovascular: Negative for chest pain.  Gastrointestinal: Positive for constipation. Negative for diarrhea.       Daily bowel movements.  Genitourinary: Negative for difficulty urinating.   Musculoskeletal: Positive for arthralgias.  Skin: Negative for rash.  Allergic/Immunologic: Negative for environmental allergies.  Neurological: Positive for numbness. Negative for dizziness and headaches.       Chronic neuropathy   Psychiatric/Behavioral: The patient is not nervous/anxious.        Past Medical History:  Diagnosis Date  . Arthritis    knee  . Bronchitis   . Colon polyp 2008  . Diabetes mellitus   . Dyspnea   . Hypercholesteremia   . Hypertension   . Hypothyroidism      Social History   Socioeconomic History  . Marital status: Married    Spouse name: Not on file  . Number of children: Not on file  . Years of education: Not on file  . Highest education level: Not on file  Occupational History  . Not on file  Social Needs  . Financial resource strain: Not on file  . Food insecurity    Worry: Not on file    Inability: Not on file  . Transportation needs    Medical: Not on file    Non-medical: Not on file  Tobacco Use  . Smoking status: Former Smoker    Packs/day: 2.00    Years: 35.00    Pack years: 70.00    Types: Cigarettes, E-cigarettes    Quit date: 12/11/2014    Years since quitting: 4.3  . Smokeless tobacco: Never Used  Substance and Sexual Activity  . Alcohol use: No    Alcohol/week: 0.0 standard drinks  . Drug use: No  .  Sexual activity: Not on file  Lifestyle  . Physical activity    Days per week: Not on file    Minutes per session: Not on file  . Stress: Not on file  Relationships  . Social Herbalist on phone: Not on file    Gets together: Not on file    Attends religious service: Not on file    Active member of club or organization: Not on file    Attends meetings of clubs or organizations: Not on file    Relationship status: Not on file  . Intimate partner violence    Fear of current or ex partner: Not on file    Emotionally abused: Not on file    Physically abused: Not on file    Forced sexual activity: Not on  file  Other Topics Concern  . Not on file  Social History Narrative   Married.   3 children. 2 grandchildren.   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     Past Surgical History:  Procedure Laterality Date  . COLONOSCOPY  2008, 2016   Dr Bary Castilla  . COLONOSCOPY WITH PROPOFOL N/A 10/03/2017   Procedure: COLONOSCOPY WITH PROPOFOL;  Surgeon: Robert Bellow, MD;  Location: [facility] ENDOSCOPY;  Service: Endoscopy;  Laterality: N/A;  . DERMATOFIBROMA  03/26/15   Procedure: Excision mass left leg;  Surgeon: Robert Bellow, MD;  Location: [facility] ORS;  Service: General;  Laterality: N/A;  . LIPOMA EXCISION N/A 03/26/2015      . TONSILLECTOMY      Family History  Problem Relation Age of Onset  . Stroke Mother   . Diabetes Mother   . Stroke Father   . Diabetes Other   . Other Daughter 25       precancerous colon polyp   . Diabetes Maternal Grandmother   . Diabetes Paternal Grandmother     Allergies  Allergen Reactions  . Atorvastatin     Other reaction(s): Headache  . Other Other (See Comments)    STATIN DRUG-CAUSED MUSCLE PAIN/LETHARGIC    Current Outpatient Medications on File Prior to Visit  Medication Sig Dispense Refill  . albuterol (PROVENTIL HFA;VENTOLIN HFA) 108 (90 Base) MCG/ACT inhaler Inhale 2 puffs into the lungs every 4 (four) hours as needed for wheezing or shortness of breath. 1 Inhaler 0  . aspirin EC 81 MG tablet Take 81 mg by mouth daily.    . fenofibrate (TRICOR) 145 MG tablet Take 145 mg by mouth daily.    Marland Kitchen glucosamine-chondroitin 500-400 MG tablet Take 2 tablets by mouth daily.    . hydrochlorothiazide (HYDRODIURIL) 50 MG tablet Take 1 tablet (50 mg total) by mouth daily. 90 tablet 1  . Insulin Degludec (TRESIBA FLEXTOUCH) 200 UNIT/ML SOPN Inject 78 Units into the skin daily.    Marland Kitchen levothyroxine (SYNTHROID) 50 MCG tablet TAKE 1 TABLET BY MOUTH ONCE A DAY ON AN EMPTY STOMACH.TAKE WITH A GLASS OF WATERAT LEAST  30-60 MIN BEFORE BREAKFAST 90 tablet 1  . metFORMIN (GLUCOPHAGE) 1000 MG tablet TAKE 1 TABLET BY MOUTH TWICE A DAY 180 tablet 1  . NOVOLOG FLEXPEN 100 UNIT/ML FlexPen Inject 28 units before breakfast, inject 20 units beforelunch, inject 28 units before supper. Increase when blood sugar is over 150. Use 2 units more for every 50 over.  9  . ramipril (ALTACE) 10 MG capsule TAKE 1 CAPSULE BY MOUTH 2 TIMES DAILY 180 capsule 1  No current facility-administered medications on file prior to visit.     BP 130/74   Pulse 80   Temp 97.7 F (36.5 C) (Temporal)   Ht 5\' 10"  (1.778 m)   Wt (!) 310 lb (140.6 kg)   SpO2 98%   BMI 44.48 kg/m    Objective:   Physical Exam  Constitutional: He is oriented to person, place, and time. He appears well-nourished.  HENT:  Right Ear: Tympanic membrane and ear canal normal.  Left Ear: Tympanic membrane and ear canal normal.  Mouth/Throat: Oropharynx is clear and moist.  Eyes: Pupils are equal, round, and reactive to light. EOM are normal.  Neck: Neck supple.  Cardiovascular: Normal rate and regular rhythm.  Respiratory: Effort normal and breath sounds normal.  GI: Soft. Bowel sounds are normal. There is no abdominal tenderness.  Musculoskeletal:     Comments: Generalized decrease in ROM to bilateral knees.   Neurological: He is alert and oriented to person, place, and time.  Skin: Skin is warm and dry.  Well healing abrasion to bilateral lower extremities.   Psychiatric: He has a normal mood and affect.           Assessment & Plan:

## 2019-04-15 NOTE — Assessment & Plan Note (Signed)
Lung cancer screening due later this year. COPD under good control.

## 2019-04-15 NOTE — Assessment & Plan Note (Signed)
Rx for Shingrix provided. Influenza vaccination provide today. All other immunizations UTD. PSA pending. Colonoscopy UTD. Lung cancer screening due later this year. Discussed the importance of a healthy diet and regular exercise in order for weight loss, and to reduce the risk of any potential medical problems. Exam today stable. Labs reviewed from [place] and also pending.

## 2019-04-15 NOTE — Assessment & Plan Note (Signed)
Repeat colonoscopy due in 2022 

## 2019-04-15 NOTE — Addendum Note (Signed)
Addended by: Jacqualin Combes on: 04/15/2019 09:18 AM   Modules accepted: Orders

## 2019-04-15 NOTE — Assessment & Plan Note (Signed)
Lipid panel from [facility] reviewed from June 2020, continue current regimen.

## 2019-04-15 NOTE — Assessment & Plan Note (Signed)
Stable, infrequent use of albuterol. Following with pulmonology.

## 2019-04-15 NOTE — Assessment & Plan Note (Signed)
Infrequent symptoms, avoids triggers.

## 2019-04-16 LAB — HEPATITIS C ANTIBODY
Hepatitis C Ab: NONREACTIVE
SIGNAL TO CUT-OFF: 0.02 (ref <1.00)

## 2019-04-18 DIAGNOSIS — E1142 Type 2 diabetes mellitus with diabetic polyneuropathy: Secondary | ICD-10-CM | POA: Diagnosis not present

## 2019-04-18 DIAGNOSIS — E669 Obesity, unspecified: Secondary | ICD-10-CM | POA: Diagnosis not present

## 2019-04-18 DIAGNOSIS — E1169 Type 2 diabetes mellitus with other specified complication: Secondary | ICD-10-CM | POA: Diagnosis not present

## 2019-04-18 DIAGNOSIS — Z794 Long term (current) use of insulin: Secondary | ICD-10-CM | POA: Diagnosis not present

## 2019-05-01 ENCOUNTER — Encounter: Payer: Self-pay | Admitting: Podiatry

## 2019-05-08 ENCOUNTER — Telehealth: Payer: Self-pay | Admitting: Internal Medicine

## 2019-05-08 NOTE — Telephone Encounter (Signed)
Per last order, cpap pressure should be set at 10-20cm. Pt stated new machine is set 4-10cm.  Suanne Marker, can you help with this.

## 2019-05-09 NOTE — Telephone Encounter (Signed)
Per Dimas Chyle at [facility] "new machine is showing 10-20 cm, the old machine wouldn't have showed up in [place]. Pt is stating that when device comes on it says 4-10 cm.  I have asked [facility] to reach out to him to determine why device is showing 4-10 cm. Rhonda J Cobb

## 2019-05-12 NOTE — Telephone Encounter (Signed)
Called and advised patient of what [facility] stated that according to [facility] his CPAP machine is set on 10-20 cm. Pt states that according to his CPAP screen it still states 4-10 cm and 10 is as high as it goes up to.  Pt also states that the CPAP doesn't seem to give as much air as it did.   Pt given [employer]'s phone number and ext to the RT Dept and asked patient to speak directly to the RT Staff at [place]. Pt stated that he will do that b/c he knows what comes up on his screen. Advised patient that if he needed [place] to contact us back at [phone]. Rhonda J Cobb

## 2019-05-15 ENCOUNTER — Telehealth: Payer: Self-pay | Admitting: Internal Medicine

## 2019-05-15 NOTE — Telephone Encounter (Signed)
LM for [name] with [employer] to return my call. Rhonda J Cobb

## 2019-05-15 NOTE — Telephone Encounter (Signed)
Spoke to pt, who stated that he received a call from [facility] stating that an appt is needed.  Pt last seen 07/2018 and was instructed to f/u around 07/2019. Pt stated per [facility], he should f/u every 58mo for cpap compliance.   Suanne Marker, can you help with this?

## 2019-05-16 NOTE — Telephone Encounter (Signed)
Melissa with [employer] returned called but I was on another line.  LMOVM for Melissa with pt's Name, DOB and what he was told by [facility] that he needed to F/U every 6 months for CPAP.  Waiting on return call from [facility] with [facility]. Rhonda J Cobb

## 2019-05-19 DIAGNOSIS — M9903 Segmental and somatic dysfunction of lumbar region: Secondary | ICD-10-CM | POA: Diagnosis not present

## 2019-05-19 DIAGNOSIS — M461 Sacroiliitis, not elsewhere classified: Secondary | ICD-10-CM | POA: Diagnosis not present

## 2019-05-19 DIAGNOSIS — M545 Low back pain: Secondary | ICD-10-CM | POA: Diagnosis not present

## 2019-05-19 DIAGNOSIS — M9904 Segmental and somatic dysfunction of sacral region: Secondary | ICD-10-CM | POA: Diagnosis not present

## 2019-05-19 NOTE — Telephone Encounter (Signed)
Spoke with Darlina Guys 05/19/2019 about this issue and she is checking into this and will advise. Rhonda J Cobb

## 2019-05-19 NOTE — Telephone Encounter (Signed)
LMOVM for patient that I have reached out to [facility] to check if CPAP visits are yearly or twice a year. Advised patient that I would reach out to him once I have an answer. Rhonda J Cobb

## 2019-05-20 DIAGNOSIS — M461 Sacroiliitis, not elsewhere classified: Secondary | ICD-10-CM | POA: Diagnosis not present

## 2019-05-20 DIAGNOSIS — M9903 Segmental and somatic dysfunction of lumbar region: Secondary | ICD-10-CM | POA: Diagnosis not present

## 2019-05-20 DIAGNOSIS — M9904 Segmental and somatic dysfunction of sacral region: Secondary | ICD-10-CM | POA: Diagnosis not present

## 2019-05-20 DIAGNOSIS — M545 Low back pain: Secondary | ICD-10-CM | POA: Diagnosis not present

## 2019-05-22 DIAGNOSIS — M9904 Segmental and somatic dysfunction of sacral region: Secondary | ICD-10-CM | POA: Diagnosis not present

## 2019-05-22 DIAGNOSIS — M545 Low back pain: Secondary | ICD-10-CM | POA: Diagnosis not present

## 2019-05-22 DIAGNOSIS — M9903 Segmental and somatic dysfunction of lumbar region: Secondary | ICD-10-CM | POA: Diagnosis not present

## 2019-05-22 DIAGNOSIS — M461 Sacroiliitis, not elsewhere classified: Secondary | ICD-10-CM | POA: Diagnosis not present

## 2019-05-23 NOTE — Telephone Encounter (Signed)
Called and spoke with Darlina Guys with [employer]. Per Lenna Sciara she has checked with [facility] and has sent email to Manager as well. Only 1 visit per year needed for CPAP compliance even if the patient changes physician within the same practice only 1 visit per year is required, unless pt's insurance which is HTA requires more than a yearly visit.  Per Melissa there is no record of anyone from [employer] calling or speaking to patient about this issue, however, the patient states that he receives about a call every two weeks from [facility] about his supplies.  Advised patient that if he receives another call about this same issue to obtain a name and date and time of the call. Pt voiced understanding and thanked me for my time.  Nothing else needed at this time. Rhonda J Cobb

## 2019-05-26 DIAGNOSIS — M461 Sacroiliitis, not elsewhere classified: Secondary | ICD-10-CM | POA: Diagnosis not present

## 2019-05-26 DIAGNOSIS — M9904 Segmental and somatic dysfunction of sacral region: Secondary | ICD-10-CM | POA: Diagnosis not present

## 2019-05-26 DIAGNOSIS — M545 Low back pain: Secondary | ICD-10-CM | POA: Diagnosis not present

## 2019-05-26 DIAGNOSIS — M9903 Segmental and somatic dysfunction of lumbar region: Secondary | ICD-10-CM | POA: Diagnosis not present

## 2019-05-29 ENCOUNTER — Other Ambulatory Visit: Payer: Self-pay | Admitting: *Deleted

## 2019-05-29 DIAGNOSIS — M9904 Segmental and somatic dysfunction of sacral region: Secondary | ICD-10-CM | POA: Diagnosis not present

## 2019-05-29 DIAGNOSIS — M461 Sacroiliitis, not elsewhere classified: Secondary | ICD-10-CM | POA: Diagnosis not present

## 2019-05-29 DIAGNOSIS — M545 Low back pain: Secondary | ICD-10-CM | POA: Diagnosis not present

## 2019-05-29 DIAGNOSIS — M9903 Segmental and somatic dysfunction of lumbar region: Secondary | ICD-10-CM | POA: Diagnosis not present

## 2019-06-03 ENCOUNTER — Ambulatory Visit: Payer: PPO | Admitting: Orthotics

## 2019-06-03 ENCOUNTER — Other Ambulatory Visit: Payer: Self-pay

## 2019-06-03 ENCOUNTER — Ambulatory Visit: Payer: PPO | Admitting: Podiatry

## 2019-06-03 ENCOUNTER — Encounter: Payer: Self-pay | Admitting: Podiatry

## 2019-06-03 DIAGNOSIS — L84 Corns and callosities: Secondary | ICD-10-CM

## 2019-06-03 DIAGNOSIS — M79674 Pain in right toe(s): Secondary | ICD-10-CM

## 2019-06-03 DIAGNOSIS — M79675 Pain in left toe(s): Secondary | ICD-10-CM | POA: Diagnosis not present

## 2019-06-03 DIAGNOSIS — Q828 Other specified congenital malformations of skin: Secondary | ICD-10-CM

## 2019-06-03 DIAGNOSIS — E119 Type 2 diabetes mellitus without complications: Secondary | ICD-10-CM

## 2019-06-03 DIAGNOSIS — B351 Tinea unguium: Secondary | ICD-10-CM

## 2019-06-03 NOTE — Patient Instructions (Signed)

## 2019-06-05 DIAGNOSIS — M545 Low back pain: Secondary | ICD-10-CM | POA: Diagnosis not present

## 2019-06-05 DIAGNOSIS — M9904 Segmental and somatic dysfunction of sacral region: Secondary | ICD-10-CM | POA: Diagnosis not present

## 2019-06-05 DIAGNOSIS — M461 Sacroiliitis, not elsewhere classified: Secondary | ICD-10-CM | POA: Diagnosis not present

## 2019-06-05 DIAGNOSIS — M9903 Segmental and somatic dysfunction of lumbar region: Secondary | ICD-10-CM | POA: Diagnosis not present

## 2019-06-09 NOTE — Progress Notes (Signed)
Subjective: [name] presents to clinic with cc of painful mycotic toenails and calluses and heel fissure which are aggravated when weightbearing with and without shoe gear.  This pain limits his daily activities. Pain symptoms resolve with periodic professional debridement.  He states his right heel feels much better. He continues to apply Vaseline Petroleum Jelly to area to keep soft.  Pleas Koch, NP is his PCP.   Current Outpatient Medications on File Prior to Visit  Medication Sig Dispense Refill  . albuterol (PROVENTIL HFA;VENTOLIN HFA) 108 (90 Base) MCG/ACT inhaler Inhale 2 puffs into the lungs every 4 (four) hours as needed for wheezing or shortness of breath. 1 Inhaler 0  . aspirin EC 81 MG tablet Take 81 mg by mouth daily.    . fenofibrate (TRICOR) 145 MG tablet Take 145 mg by mouth daily.    . hydrochlorothiazide (HYDRODIURIL) 50 MG tablet Take 1 tablet (50 mg total) by mouth daily. 90 tablet 1  . Insulin Degludec (TRESIBA FLEXTOUCH) 200 UNIT/ML SOPN Inject 78 Units into the skin daily.    Marland Kitchen levothyroxine (SYNTHROID) 50 MCG tablet TAKE 1 TABLET BY MOUTH ONCE A DAY ON AN EMPTY STOMACH.TAKE WITH A GLASS OF WATERAT LEAST 30-60 MIN BEFORE BREAKFAST 90 tablet 1  . metFORMIN (GLUCOPHAGE) 1000 MG tablet TAKE 1 TABLET BY MOUTH TWICE A DAY 180 tablet 1  . NOVOLOG FLEXPEN 100 UNIT/ML FlexPen Inject 28 units before breakfast, inject 20 units beforelunch, inject 28 units before supper. Increase when blood sugar is over 150. Use 2 units more for every 50 over.  9  . ramipril (ALTACE) 10 MG capsule TAKE 1 CAPSULE BY MOUTH 2 TIMES DAILY 180 capsule 1   No current facility-administered medications on file prior to visit.      Allergies  Allergen Reactions  . Atorvastatin     Other reaction(s): Headache  . Other Other (See Comments)    STATIN DRUG-CAUSED MUSCLE PAIN/LETHARGIC     Objective: There were no vitals filed for this visit.  Physical Examination:  Vascular   Examination: Capillary refill time immediate x 10 digits.  Palpable DP/PT pulses b/l.  Digital hair absent b/l.  No edema noted b/l.  Skin temperature gradient WNL b/l.  Dermatological Examination: Skin with normal turgor, texture and tone b/l.  No open wounds b/l.  No interdigital macerations noted b/l.  Elongated, thick, discolored brittle toenails with subungual debris and pain on dorsal palpation of nailbeds 1-5 b/l.  Hyperkeratotic lesion submet head submet head 5 right foot with tenderness to palpation. No edema, no erythema, no drainage, no flocculence.   Porokeratotic lesions submet head 3 right foot with tenderness to palpation. No erythema, no edema, no drainage, no flocculence.   Improvement in appearance of heel fissure.  Musculoskeletal Examination: Muscle strength 5/5 to all muscle groups b/l.  No pain, crepitus or joint discomfort with active/passive ROM.  Neurological Examination: Sensation intact 5/5 b/l with 10 gram monofilament.  Vibratory sensation intact b/l.  Proprioceptive sensation intact b/l.  Assessment: 1. Mycotic nail infection with pain 1-5 b/l 2. Callus submet head 5 right foot 3. Porokeratosis submet head 3 right foot 4. Heel fissure right foot, improved with daily application of Vaseline Petroleum Jelly 5. NIDDM  Plan: 1. Toenails 1-5 b/l were debrided in length and girth without iatrogenic laceration.  2. Porokeratosis submet head 3 right foot pared and enucleated with sterile scalpel blade without incident. 3. Painful hyperkeratotic lesion pared submet head 5 right foot. 4. Continue soft,  supportive shoe gear daily. 5. Report any pedal injuries to medical professional. 6. Follow up 3 months. 7. Patient/POA to call should there be a question/concern in there interim.

## 2019-06-11 ENCOUNTER — Other Ambulatory Visit: Payer: Self-pay

## 2019-06-17 DIAGNOSIS — M9904 Segmental and somatic dysfunction of sacral region: Secondary | ICD-10-CM | POA: Diagnosis not present

## 2019-06-17 DIAGNOSIS — M9903 Segmental and somatic dysfunction of lumbar region: Secondary | ICD-10-CM | POA: Diagnosis not present

## 2019-06-17 DIAGNOSIS — M545 Low back pain: Secondary | ICD-10-CM | POA: Diagnosis not present

## 2019-06-17 DIAGNOSIS — M461 Sacroiliitis, not elsewhere classified: Secondary | ICD-10-CM | POA: Diagnosis not present

## 2019-06-26 ENCOUNTER — Telehealth: Payer: Self-pay

## 2019-06-26 DIAGNOSIS — Z87891 Personal history of nicotine dependence: Secondary | ICD-10-CM

## 2019-06-26 NOTE — Telephone Encounter (Signed)
Left message for pt to inform him that it is time for his annual lung cancer screening. Instructed pt to return call and confirm information prior to CT scan being scheduled.

## 2019-06-27 ENCOUNTER — Other Ambulatory Visit: Payer: Self-pay

## 2019-06-27 ENCOUNTER — Ambulatory Visit: Payer: PPO | Admitting: Orthotics

## 2019-06-27 DIAGNOSIS — E1140 Type 2 diabetes mellitus with diabetic neuropathy, unspecified: Secondary | ICD-10-CM | POA: Diagnosis not present

## 2019-06-27 DIAGNOSIS — M2042 Other hammer toe(s) (acquired), left foot: Secondary | ICD-10-CM | POA: Diagnosis not present

## 2019-06-27 DIAGNOSIS — M2041 Other hammer toe(s) (acquired), right foot: Secondary | ICD-10-CM | POA: Diagnosis not present

## 2019-06-27 NOTE — Addendum Note (Signed)
Addended by: Lieutenant Diego on: 06/27/2019 09:35 AM   Modules accepted: Orders

## 2019-06-27 NOTE — Telephone Encounter (Signed)
Patient has been notified that annual lung cancer screening low dose CT scan is due currently or will be in near future. Confirmed that patient is within the age range of 55-77, and asymptomatic, (no signs or symptoms of lung cancer). Patient denies illness that would prevent curative treatment for lung cancer if found. Verified smoking history, (former, quit 12/11/14, 70 pack year). The shared decision making visit was done 06/27/16. Patient is agreeable for CT scan being scheduled.

## 2019-07-02 ENCOUNTER — Ambulatory Visit
Admission: RE | Admit: 2019-07-02 | Discharge: 2019-07-02 | Disposition: A | Discharge status: Home / Self Care | Payer: PPO | Source: Ambulatory Visit | Attending: Nurse Practitioner | Admitting: Nurse Practitioner

## 2019-07-02 ENCOUNTER — Other Ambulatory Visit: Payer: Self-pay

## 2019-07-02 DIAGNOSIS — Z87891 Personal history of nicotine dependence: Secondary | ICD-10-CM | POA: Insufficient documentation

## 2019-07-04 ENCOUNTER — Encounter: Payer: Self-pay | Admitting: *Deleted

## 2019-07-04 DIAGNOSIS — J449 Chronic obstructive pulmonary disease, unspecified: Secondary | ICD-10-CM | POA: Diagnosis not present

## 2019-07-04 DIAGNOSIS — G4733 Obstructive sleep apnea (adult) (pediatric): Secondary | ICD-10-CM | POA: Diagnosis not present

## 2019-07-15 DIAGNOSIS — E1142 Type 2 diabetes mellitus with diabetic polyneuropathy: Secondary | ICD-10-CM | POA: Diagnosis not present

## 2019-07-30 ENCOUNTER — Telehealth: Payer: Self-pay | Admitting: Pulmonary Disease

## 2019-07-30 NOTE — Telephone Encounter (Signed)
Recommend virtual visit.

## 2019-07-31 ENCOUNTER — Encounter: Payer: Self-pay | Admitting: Pulmonary Disease

## 2019-07-31 ENCOUNTER — Other Ambulatory Visit: Payer: Self-pay | Admitting: Primary Care

## 2019-07-31 ENCOUNTER — Ambulatory Visit (INDEPENDENT_AMBULATORY_CARE_PROVIDER_SITE_OTHER): Payer: HMO | Admitting: Pulmonary Disease

## 2019-07-31 DIAGNOSIS — J432 Centrilobular emphysema: Secondary | ICD-10-CM

## 2019-07-31 DIAGNOSIS — E038 Other specified hypothyroidism: Secondary | ICD-10-CM

## 2019-07-31 DIAGNOSIS — Z9989 Dependence on other enabling machines and devices: Secondary | ICD-10-CM | POA: Diagnosis not present

## 2019-07-31 DIAGNOSIS — G4733 Obstructive sleep apnea (adult) (pediatric): Secondary | ICD-10-CM

## 2019-07-31 DIAGNOSIS — E039 Hypothyroidism, unspecified: Secondary | ICD-10-CM

## 2019-07-31 DIAGNOSIS — J9611 Chronic respiratory failure with hypoxia: Secondary | ICD-10-CM | POA: Diagnosis not present

## 2019-07-31 DIAGNOSIS — Z87891 Personal history of nicotine dependence: Secondary | ICD-10-CM | POA: Diagnosis not present

## 2019-07-31 NOTE — Progress Notes (Signed)
[facility]  Chief Complaint  Patient presents with  . Follow-up    former DR pt- wearing cpap avg 3-8hr nightly- feels mask could be a little thicker. DME: [employer]    Constitutional:  There were no vitals taken for this visit.  Deferred.  Past Medical History:  Colon polyp, DM, HLD, HTN, Hypothyroidism  Brief Summary:  [name] is a 69 y.o. male former smoker with obstructive sleep apnea, COPD with emphysema, and chronic hypoxic respiratory failure.  Virtual Visit via Telephone Note  I connected with [name] on 07/31/19 at 10:30 AM EST by telephone and verified that I am speaking with the correct person using two identifiers.  Location: Patient: home Provider: medical office   I discussed the limitations, risks, security and privacy concerns of performing an evaluation and management service by telephone and the availability of in person appointments. I also discussed with the patient that there may be a patient responsible charge related to this service. The patient expressed understanding and agreed to proceed.  Previously seen by Dr. Ashby Dawes.  Using 1.5 liters oxygen at night with CPAP.  Needs better fitting mask.  Not having sinus congestion, sore throat, or dry mouth.  His knee pain limits his activity more than his breathing.  Uses albuterol couple times per month.  Not having cough, wheeze, sputum.  He is having trouble with billing department with [employer].  They are wanting to charge him for oxygen set up that should be covered by insurance, and he was told by someone that he needs to be seen by a physician twice per year.   Physical Exam:   Deferred.  Assessment/Plan:   Obstructive sleep apnea. - he is compliant with CPAP and reports benefit - continue auto CPAP 10 to 20 cm H2O - will arrange for mask refitting  COPD with emphysema. - stable - continue prn albuterol  Chronic respiratory failure with  hypoxia. - uses 1.5 liters oxygen with CPAP at night - he will f/u with [employer] to determine what is required to clear billing issues with home oxygen set up  History of tobacco abuse. - he will have follow up low dose CT chest in December 2021 schedule through his PCP   Patient Instructions  Will arrange for CPAP mask refitting  Follow up in 1 year    I discussed the assessment and treatment plan with the patient. The patient was provided an opportunity to ask questions and all were answered. The patient agreed with the plan and demonstrated an understanding of the instructions.   The patient was advised to call back or seek an in-person evaluation if the symptoms worsen or if the condition fails to improve as anticipated.  I provided 23 minutes of non-face-to-face time during this encounter.   Chesley Mires, MD [facility] Pager: [phone] 07/31/2019, 11:10 AM  Flow Sheet     Pulmonary tests:  PFT 01/08/18 >> FEV1 2.19 (64%), FEV1% 69, TLC 5.11 (78%), DLCO 44%, no BD  Chest imaging:  CT chest lung cancer screening 07/02/19 >> atherosclerosis, mild/mod centrilobular and paraseptal emphysema, b/l nodules up to 7 mm in LUL, fatty liver  Sleep tests:  HST 11/14/17 >> AHI 50, SpO2 low 71% Auto CPAP 06/30/19 to 07/29/19 >> used on 28 of 30 nights with average 6 hrs 15 min.  Average AHI 2.1 with median CPAP 12 and 95 th percentile CPAP 16 cm H2O  Medications:   Allergies as of  07/31/2019      Reactions   Atorvastatin    Other reaction(s): Headache   Other Other (See Comments)   STATIN DRUG-CAUSED MUSCLE PAIN/LETHARGIC      Medication List       Accurate as of July 31, 2019 11:10 AM. If you have any questions, ask your nurse or doctor.        albuterol 108 (90 Base) MCG/ACT inhaler Commonly known as: VENTOLIN HFA Inhale 2 puffs into the lungs every 4 (four) hours as needed for wheezing or shortness of breath.   aspirin EC 81 MG tablet Take 81  mg by mouth daily.   fenofibrate 145 MG tablet Commonly known as: TRICOR Take 145 mg by mouth daily.   hydrochlorothiazide 50 MG tablet Commonly known as: HYDRODIURIL Take 1 tablet (50 mg total) by mouth daily.   levothyroxine 50 MCG tablet Commonly known as: SYNTHROID TAKE 1 TABLET BY MOUTH ONCE A DAY ON AN EMPTY STOMACH.TAKE WITH A GLASS OF WATERAT LEAST 30-60 MIN BEFORE BREAKFAST   metFORMIN 1000 MG tablet Commonly known as: GLUCOPHAGE TAKE 1 TABLET BY MOUTH TWICE A DAY   NovoLOG FlexPen 100 UNIT/ML FlexPen Generic drug: insulin aspart Inject 28 units before breakfast, inject 20 units beforelunch, inject 28 units before supper. Increase when blood sugar is over 150. Use 2 units more for every 50 over.   ramipril 10 MG capsule Commonly known as: ALTACE TAKE 1 CAPSULE BY MOUTH 2 TIMES DAILY   Tresiba FlexTouch 200 UNIT/ML Sopn Generic drug: Insulin Degludec Inject 78 Units into the skin daily.       Past Surgical History:  He  has a past surgical history that includes Tonsillectomy; Colonoscopy (2008, 2016); Lipoma excision (N/A, 03/26/2015); DERMATOFIBROMA (03/26/15); and Colonoscopy with propofol (N/A, 10/03/2017).  Family History:  His family history includes Diabetes in his maternal grandmother, mother, paternal grandmother, and another family member; Other (age of onset: 14) in his daughter; Stroke in his father and mother.  Social History:  He  reports that he quit smoking about 4 years ago. His smoking use included cigarettes and e-cigarettes. He has a 70.00 pack-year smoking history. He has never used smokeless tobacco. He reports that he does not drink alcohol or use drugs.

## 2019-07-31 NOTE — Patient Instructions (Signed)
Will arrange for CPAP mask refitting  Follow up in 1 year 

## 2019-08-04 ENCOUNTER — Ambulatory Visit (INDEPENDENT_AMBULATORY_CARE_PROVIDER_SITE_OTHER): Payer: HMO | Admitting: Family Medicine

## 2019-08-04 ENCOUNTER — Encounter: Payer: Self-pay | Admitting: Family Medicine

## 2019-08-04 ENCOUNTER — Other Ambulatory Visit: Payer: Self-pay

## 2019-08-04 VITALS — BP 180/78 | HR 94 | Temp 97.2°F | Ht 70.0 in | Wt 311.8 lb

## 2019-08-04 DIAGNOSIS — M170 Bilateral primary osteoarthritis of knee: Secondary | ICD-10-CM | POA: Diagnosis not present

## 2019-08-04 MED ORDER — METHYLPREDNISOLONE ACETATE 40 MG/ML IJ SUSP
80.0000 mg | Freq: Once | INTRAMUSCULAR | Status: AC
  Administered 2019-08-04: 10:00:00 80 mg via INTRA_ARTICULAR

## 2019-08-04 NOTE — Addendum Note (Signed)
Addended by: Carter Kitten on: 08/04/2019 09:59 AM   Modules accepted: Orders

## 2019-08-04 NOTE — Progress Notes (Signed)
Juan Toothaker T. Sheniece Ruggles, MD Primary Care and Sports Medicine [facility] at [facility] [place], [address] Phone: [phone]  FAX: [place] - 69 y.o. male  MRN [name]  Date of Birth: 1951/04/28  Visit Date: 08/04/2019  PCP: Pleas Koch, NP  Referred by: Pleas Koch, NP  Chief Complaint  Patient presents with  . Knee Pain    Bilateral    This visit occurred during the SARS-CoV-2 public health emergency.  Safety protocols were in place, including screening questions prior to the visit, additional usage of staff PPE, and extensive cleaning of exam room while observing appropriate contact time as indicated for disinfecting solutions.   Subjective:   [name] is a 69 y.o. very pleasant male patient with Body mass index is 44.73 kg/m. who presents with the following:  Has lasted about 90 days. Last time. He has B knee OA, and he wanted to get B Knee injections today.  Also, he asks about viscosupplementation and other options for him.  Past Medical History, Surgical History, Social History, Family History, Problem List, Medications, and Allergies have been reviewed and updated if relevant.   GEN: No fevers, chills. Nontoxic. Primarily MSK c/o today. MSK: Detailed in the HPI GI: tolerating PO intake without difficulty Neuro: No numbness, parasthesias, or tingling associated. Otherwise the pertinent positives of the ROS are noted above.   Objective:   BP (!) 180/78   Pulse 94   Temp (!) 97.2 F (36.2 C) (Temporal)   Ht 5\' 10"  (1.778 m)   Wt (!) 311 lb 12 oz (141.4 kg)   SpO2 92%   BMI 44.73 kg/m    GEN: WDWN, NAD, Non-toxic, Alert & Oriented x 3 HEENT: Atraumatic, Normocephalic.  Ears and Nose: No external deformity. EXTR: No clubbing/cyanosis/edema NEURO: Normal gait.  PSYCH: Normally interactive. Conversant. Not depressed or anxious appearing.  Calm demeanor.    B medial > Lateral  joint line tenderness with notable crepitus. Difficulty rising from a seated position.   Needs assistance to the exam table Minimal effusion  Radiology: No results found.  Assessment and Plan:     ICD-10-CM   1. Primary osteoarthritis of knees, bilateral  [id number]    Visco is certainly an option - I asked him to call ahead so we could get his preferred product.  Aspiration/Injection Procedure Note GALEN TRABUE 1950-12-12 Date of procedure: 08/04/2019  Procedure: Large Joint Joint Aspiration / Injection of the Right Knee Indications: Pain  Procedure Details Patient verbally consented to procedure. Risks (including potential rare risk of infection), benefits, and alternatives explained. Sterilely prepped with Chloraprep. Ethyl cholride used for anesthesia. 8 cc Lidocaine 1% mixed with 2 mL Depo-Medrol 40 mg injected using the anteromedial approach without difficulty. No complications with procedure and tolerated well. Patient had decreased pain post-injection.  Medication: 2 mL of Depo-Medrol 40 mg, equaling Depo-Medrol 80 mg total  Aspiration/Injection Procedure Note CARMI OSTERKAMP Feb 10, 1951 Date of procedure: 08/04/2019  Procedure: Large Joint Aspiration / Injection of the Left Knee Indications: Pain  Procedure Details Patient verbally consented to procedure. Risks (including potential rare risk of infection), benefits, and alternatives explained. Sterilely prepped with Chloraprep. Ethyl cholride used for anesthesia. 8 cc Lidocaine 1% mixed with 2 mL Depo-Medrol 40 mg injected using the anteromedial approach without difficulty. No complications with procedure and tolerated well. Patient had decreased pain post-injection.  Medication: 2 mL of Depo-Medrol 40 mg, equaling Depo-Medrol  80 mg total   Follow-up: No follow-ups on file.  No orders of the defined types were placed in this encounter.  No orders of the defined types were placed in this encounter.   Signed,  [name].  Meisha Salone, MD   Outpatient Encounter Medications as of 08/04/2019  Medication Sig  . albuterol (PROVENTIL HFA;VENTOLIN HFA) 108 (90 Base) MCG/ACT inhaler Inhale 2 puffs into the lungs every 4 (four) hours as needed for wheezing or shortness of breath.  Marland Kitchen aspirin EC 81 MG tablet Take 81 mg by mouth daily.  . fenofibrate (TRICOR) 145 MG tablet Take 145 mg by mouth daily.  . hydrochlorothiazide (HYDRODIURIL) 50 MG tablet Take 1 tablet (50 mg total) by mouth daily.  . Insulin Degludec (TRESIBA FLEXTOUCH) 200 UNIT/ML SOPN Inject 78 Units into the skin daily.  Marland Kitchen levothyroxine (SYNTHROID) 50 MCG tablet TAKE 1 TABLET BY MOUTH ONCE DAILY ON AN EMPTY STOMACH WITH A GLASS OF WATER AT LEAST 30 TO 60 MINUTES BEFORE BREAKFAST.  . metFORMIN (GLUCOPHAGE) 1000 MG tablet TAKE 1 TABLET BY MOUTH TWICE A DAY  . NOVOLOG FLEXPEN 100 UNIT/ML FlexPen Inject 28 units before breakfast, inject 20 units beforelunch, inject 28 units before supper. Increase when blood sugar is over 150. Use 2 units more for every 50 over.  . ramipril (ALTACE) 10 MG capsule TAKE 1 CAPSULE BY MOUTH 2 TIMES DAILY   No facility-administered encounter medications on file as of 08/04/2019.

## 2019-08-12 DIAGNOSIS — Z794 Long term (current) use of insulin: Secondary | ICD-10-CM | POA: Diagnosis not present

## 2019-08-12 DIAGNOSIS — I10 Essential (primary) hypertension: Secondary | ICD-10-CM | POA: Diagnosis not present

## 2019-08-12 DIAGNOSIS — Z789 Other specified health status: Secondary | ICD-10-CM | POA: Diagnosis not present

## 2019-08-12 DIAGNOSIS — E669 Obesity, unspecified: Secondary | ICD-10-CM | POA: Diagnosis not present

## 2019-08-12 DIAGNOSIS — E1142 Type 2 diabetes mellitus with diabetic polyneuropathy: Secondary | ICD-10-CM | POA: Diagnosis not present

## 2019-08-12 DIAGNOSIS — E1169 Type 2 diabetes mellitus with other specified complication: Secondary | ICD-10-CM | POA: Diagnosis not present

## 2019-08-12 DIAGNOSIS — E1165 Type 2 diabetes mellitus with hyperglycemia: Secondary | ICD-10-CM | POA: Diagnosis not present

## 2019-09-02 ENCOUNTER — Encounter: Payer: Self-pay | Admitting: Podiatry

## 2019-09-02 ENCOUNTER — Ambulatory Visit: Payer: HMO | Admitting: Podiatry

## 2019-09-02 ENCOUNTER — Other Ambulatory Visit: Payer: Self-pay

## 2019-09-02 DIAGNOSIS — M79675 Pain in left toe(s): Secondary | ICD-10-CM | POA: Diagnosis not present

## 2019-09-02 DIAGNOSIS — M2041 Other hammer toe(s) (acquired), right foot: Secondary | ICD-10-CM

## 2019-09-02 DIAGNOSIS — M2042 Other hammer toe(s) (acquired), left foot: Secondary | ICD-10-CM

## 2019-09-02 DIAGNOSIS — L84 Corns and callosities: Secondary | ICD-10-CM | POA: Diagnosis not present

## 2019-09-02 DIAGNOSIS — Z794 Long term (current) use of insulin: Secondary | ICD-10-CM

## 2019-09-02 DIAGNOSIS — M79674 Pain in right toe(s): Secondary | ICD-10-CM | POA: Diagnosis not present

## 2019-09-02 DIAGNOSIS — B351 Tinea unguium: Secondary | ICD-10-CM | POA: Diagnosis not present

## 2019-09-02 DIAGNOSIS — E119 Type 2 diabetes mellitus without complications: Secondary | ICD-10-CM

## 2019-09-02 NOTE — Patient Instructions (Signed)
Diabetes Mellitus and Foot Care Foot care is an important part of your health, especially when you have diabetes. Diabetes may cause you to have problems because of poor blood flow (circulation) to your feet and legs, which can cause your skin to:  Become thinner and drier.  Break more easily.  Heal more slowly.  Peel and crack. You may also have nerve damage (neuropathy) in your legs and feet, causing decreased feeling in them. This means that you may not notice minor injuries to your feet that could lead to more serious problems. Noticing and addressing any potential problems early is the best way to prevent future foot problems. How to care for your feet Foot hygiene  Wash your feet daily with warm water and mild soap. Do not use hot water. Then, pat your feet and the areas between your toes until they are completely dry. Do not soak your feet as this can dry your skin.  Trim your toenails straight across. Do not dig under them or around the cuticle. File the edges of your nails with an emery board or nail file.  Apply a moisturizing lotion or petroleum jelly to the skin on your feet and to dry, brittle toenails. Use lotion that does not contain alcohol and is unscented. Do not apply lotion between your toes. Shoes and socks  Wear clean socks or stockings every day. Make sure they are not too tight. Do not wear knee-high stockings since they may decrease blood flow to your legs.  Wear shoes that fit properly and have enough cushioning. Always look in your shoes before you put them on to be sure there are no objects inside.  To break in new shoes, wear them for just a few hours a day. This prevents injuries on your feet. Wounds, scrapes, corns, and calluses  Check your feet daily for blisters, cuts, bruises, sores, and redness. If you cannot see the bottom of your feet, use a mirror or ask someone for help.  Do not cut corns or calluses or try to remove them with medicine.  If you  find a minor scrape, cut, or break in the skin on your feet, keep it and the skin around it clean and dry. You may clean these areas with mild soap and water. Do not clean the area with peroxide, alcohol, or iodine.  If you have a wound, scrape, corn, or callus on your foot, look at it several times a day to make sure it is healing and not infected. Check for: ? Redness, swelling, or pain. ? Fluid or blood. ? Warmth. ? Pus or a bad smell. General instructions  Do not cross your legs. This may decrease blood flow to your feet.  Do not use heating pads or hot water bottles on your feet. They may burn your skin. If you have lost feeling in your feet or legs, you may not know this is happening until it is too late.  Protect your feet from hot and cold by wearing shoes, such as at the beach or on hot pavement.  Schedule a complete foot exam at least once a year (annually) or more often if you have foot problems. If you have foot problems, report any cuts, sores, or bruises to your health care provider immediately. Contact a health care provider if:  You have a medical condition that increases your risk of infection and you have any cuts, sores, or bruises on your feet.  You have an injury that is not   healing.  You have redness on your legs or feet.  You feel burning or tingling in your legs or feet.  You have pain or cramps in your legs and feet.  Your legs or feet are numb.  Your feet always feel cold.  You have pain around a toenail. Get help right away if:  You have a wound, scrape, corn, or callus on your foot and: ? You have pain, swelling, or redness that gets worse. ? You have fluid or blood coming from the wound, scrape, corn, or callus. ? Your wound, scrape, corn, or callus feels warm to the touch. ? You have pus or a bad smell coming from the wound, scrape, corn, or callus. ? You have a fever. ? You have a red line going up your leg. Summary  Check your feet every day  for cuts, sores, red spots, swelling, and blisters.  Moisturize feet and legs daily.  Wear shoes that fit properly and have enough cushioning.  If you have foot problems, report any cuts, sores, or bruises to your health care provider immediately.  Schedule a complete foot exam at least once a year (annually) or more often if you have foot problems. This information is not intended to replace advice given to you by your health care provider. Make sure you discuss any questions you have with your health care provider. Document Revised: 03/26/2019 Document Reviewed: 08/04/2016 Elsevier Patient Education  2020 Elsevier Inc.  

## 2019-09-02 NOTE — Progress Notes (Signed)
Subjective: [name] presents today for follow up of preventative diabetic foot care, for diabetic foot evaluation and callus(es) right foot and painful mycotic toenails b/l that are difficult to trim. Pain interferes with ambulation. Aggravating factors include wearing enclosed shoe gear. Pain is relieved with periodic professional debridement.   He states he has been wearing shoes indoors as instructed and has not developed anymore heel fissures. He voices no new pedal concerns on today's visit.  Allergies  Allergen Reactions  . Atorvastatin     Other reaction(s): Headache  . Other Other (See Comments)    STATIN DRUG-CAUSED MUSCLE PAIN/LETHARGIC     Objective: There were no vitals filed for this visit.  Vascular Examination:  Capillary fill time to digits <3s b/l, palpable PT pulses b/l, faintly palpable DP pulses b/l, pedal hair sparse b/l and skin temperature gradient within normal limits b/l  Dermatological Examination: Pedal skin with normal turgor, texture and tone bilaterally, no open wounds bilaterally, no interdigital macerations bilaterally, toenails 1-5 b/l elongated, dystrophic, thickened, crumbly with subungual debris and hyperkeratotic lesion(s) submet heads 3, 5 right foot.  No erythema, no edema, no drainage, no flocculence  Musculoskeletal: Normal muscle strength 5/5 to all lower extremity muscle groups bilaterally, no pain crepitus or joint limitation noted with ROM b/l and hammertoes noted to the  1-5 bilaterally  Neurological: Protective sensation intact 5/5 intact bilaterally with 10g monofilament b/l and vibratory sensation intact b/l  Assessment: 1. Pain due to onychomycosis of toenails of both feet   2. Callus   3. Acquired hammertoes of both feet   4. Type 2 diabetes mellitus without complication, with long-term current use of insulin ([place])   5. Encounter for diabetic foot exam ([place])    Plan: Diabetic foot examination performed on today's  visit. -Continue diabetic foot care principles. Literature dispensed on today.  -Toenails 1-5 b/l were debrided in length and girth without iatrogenic bleeding. -calluses were debrided without complication or incident. Total number debrided =2 submet heads 3, 5 right foot -Patient to continue soft, supportive shoe gear daily. -Patient to report any pedal injuries to medical professional immediately. -Patient/POA to call should there be question/concern in the interim.  Return in about 3 months (around 11/30/2019) for diabetic nail and callus trim.

## 2019-09-03 ENCOUNTER — Ambulatory Visit: Payer: PPO | Admitting: Podiatry

## 2019-09-04 DIAGNOSIS — J449 Chronic obstructive pulmonary disease, unspecified: Secondary | ICD-10-CM | POA: Diagnosis not present

## 2019-09-04 DIAGNOSIS — R0902 Hypoxemia: Secondary | ICD-10-CM | POA: Diagnosis not present

## 2019-09-04 DIAGNOSIS — G4733 Obstructive sleep apnea (adult) (pediatric): Secondary | ICD-10-CM | POA: Diagnosis not present

## 2019-10-01 ENCOUNTER — Other Ambulatory Visit: Payer: Self-pay | Admitting: *Deleted

## 2019-10-01 NOTE — Patient Outreach (Signed)
[name]) Care Management Chronic Special Needs Program    10/01/2019  Name: [name], DOB: January 10, 1951  MRN: [id number]   Mr. Cartez Barton is enrolled in a chronic special needs plan for diabetes. Health risk assessment completed previously by client.  Interdisciplinary care plan created from information in electronic medical record.  Client also has history of hypertension.  RN care manager will send introductory letter with interdisciplinary care plan to primary care provider and client, along with education materials.  Assigned RN care manager will follow up within 3-4 months.  Goals    .  Acknowledge receipt of Art gallery manager mailed advanced directives packet RN care manager mailed EMMI education article "Advanced Directives" Please call RN care manager at [phone] if any questions about completion of advanced directives    . Client understands the importance of follow-up with providers by attending scheduled visits     Per medical record review, client saw primary care provider on 04/15/19 Client saw endocrinologist 04/18/19, 07/15/19 and 08/12/19. Client saw podiatrist 09/02/19 Continue seeing your doctor and scheduling appointments as needed     . Client will verbalize knowledge of self management of Hypertension as evidences by BP reading of 140/90 or less; or as defined by provider     Blood pressure 164/84 at 08/12/19 doctor's appointment (endocrinology) Please take your medications as prescribed Continue to follow up with your doctor Please ask your doctor "what is my target blood pressure range" Monitor your blood pressure and take results to doctor's appointment Plan to follow a low salt diet RN care manager mailed EMMI education article "High blood pressure- health problems"     . HEMOGLOBIN A1C < 7.0     Per medical record review- Hgb AIC 8.1 on 06/2019 RN care manager mailed EMMI education article "Diabetes and  blood pressure" Please monitor blood sugar per provider recommendation Check feet daily Visit provider every 3-6 months as directed Hgb AIC every 3-6 months Eye exam yearly Carbohydrate controlled meal planning Take diabetes medication as prescribed Physical activity- talk with your doctor about exercising     . Maintain timely refills of diabetic medication as prescribed within the year .     Continue taking medications as prescribed Maintain timely refills of medications to avoid running out of medication Speak with your doctor about any issues with your medications    . Obtain annual  Lipid Profile, LDL-C     Lipid profile completed 01/07/19 and 07/15/19 with LDL 93 The goal for LDL is less than 70 mg/dl as you are at high risk for complications Try to avoid saturated fats, trans-fats and eat more fiber RN care manager mailed EMMI education article "Coronary artery disease"     . Obtain Annual Eye (retinal)  Exam      Per medical review- eye exam completed on 03/05/18 Please plan to have dilated eye exam every year Please schedule your eye exam if you have not done so    . Obtain Annual Foot Exam     Per medical record review- foot exam 09/02/19 Continue to have your feet checked at least once per year Continue to follow up with provider     . Obtain annual screen for micro albuminuria (urine) , nephropathy (kidney problems)     Please schedule annual screen for micro albuminuria if you have not done so This is important to have this completed at least once yearly    .  Obtain Hemoglobin A1C at least 2 times per year     Hgb AIC checked twice in 2020 Hgb AIC 8.1 Continue to follow up with your provider and have labwork as ordered    . Visit Primary Care Provider or Endocrinologist at least 2 times per year      Client saw primary care provider on 04/15/19 and saw endocrinologist >3 times in 2020. Continue to follow up with your providers for physical and labwork as  ordered       Jacqlyn Larsen [facility], [place] Coordinator [phone]

## 2019-10-07 DIAGNOSIS — G4733 Obstructive sleep apnea (adult) (pediatric): Secondary | ICD-10-CM | POA: Diagnosis not present

## 2019-10-07 DIAGNOSIS — J449 Chronic obstructive pulmonary disease, unspecified: Secondary | ICD-10-CM | POA: Diagnosis not present

## 2019-10-12 DIAGNOSIS — R0902 Hypoxemia: Secondary | ICD-10-CM | POA: Diagnosis not present

## 2019-10-12 DIAGNOSIS — J449 Chronic obstructive pulmonary disease, unspecified: Secondary | ICD-10-CM | POA: Diagnosis not present

## 2019-10-12 DIAGNOSIS — G4733 Obstructive sleep apnea (adult) (pediatric): Secondary | ICD-10-CM | POA: Diagnosis not present

## 2019-10-23 ENCOUNTER — Other Ambulatory Visit: Payer: Self-pay | Admitting: *Deleted

## 2019-10-23 ENCOUNTER — Encounter: Payer: Self-pay | Admitting: *Deleted

## 2019-10-23 NOTE — Patient Outreach (Signed)
[facility]) Care Management Chronic Special Needs Program  10/23/2019  Name: [name] DOB: April 03, 1951  MRN: [id number]  [name] is enrolled in a chronic special needs plan for Diabetes. Chronic Care Management Coordinator telephoned client to review health risk assessment and to develop individualized care plan.  Introduced the chronic care management program, importance of client participation, and taking their care plan to all provider appointments and inpatient facilities.  Reviewed the transition of care process and possible referral to community care management.  Subjective: Client reports he lives with spouse and adult son and is independent with ADL/ IADL's, reports CBG 120 today and ranges for fasting are 100-140, random CBG ranges 90-190's. Insulin dosage recently adjusted/ increased dosage.  Client to see endocrinologist end of April. Reports checks blood pressure every other day with most recent reading 135/86.  Client reports knee pain and rates 8, sees Dr. Lorelei Pont at [facility] and has had interventions such as cortisone injections, client chooses not take any pain medication except over the counter at times, does not feel there is much else that can be done other than knee replacement and client is trying to hold off. Client goal is "to move around better and lose 70 pounds".  Goals Addressed            This Visit's Progress   . COMPLETED:  Acknowledge receipt of Advanced Directive package       Client did receive advanced directives packet and will review Client will contact RN care manager for any assistance needed    . " I want to move around better and lose 70 pounds" (pt-stated)       RN care manager placed order for HTA health coach to contact client related to weight loss strategies, healthy eating and exercising. RN care manager reinforced health food choices including lean proteins, fruits and vegetables and fiber.    . Client will  verbalize knowledge of self management of Hypertension as evidences by BP reading of 140/90 or less; or as defined by provider       Blood pressure 164/84 at 08/12/19 doctor's appointment (endocrinology) Please take your medications as prescribed Continue to follow up with your doctor Please ask your doctor "what is my target blood pressure range" Monitor your blood pressure and take results to doctor's appointment Plan to follow a low salt diet      . HEMOGLOBIN A1C < 7.0       Per medical record review- Hgb AIC 8.1 on 06/2019 Please monitor blood sugar per provider recommendation Check feet daily Visit provider every 3-6 months as directed Hgb AIC every 3-6 months Eye exam yearly Carbohydrate controlled meal planning Take diabetes medication as prescribed Physical activity- talk with your doctor about exercising and work with HTA health coach as she will be contacting you     . Maintain timely refills of diabetic medication as prescribed within the year .       Continue taking medications as prescribed Maintain timely refills of medications to avoid running out of medication Speak with your doctor about any issues with your medications Take medications as prescribed. Follow up with your health care provider if you have any questions. Please contact your assigned RN care manager if you have difficulty obtaining medications. Review of electronic medical record medication dispense report indicates client maintains timely refills of diabetic medications.     . Obtain annual  Lipid Profile, LDL-C       Lipid  profile completed 01/07/19 and 07/15/19 with LDL 93 The goal for LDL is less than 70 mg/dl as you are at high risk for complications Try to avoid saturated fats, trans-fats and eat more fiber      . Obtain Annual Foot Exam       Per medical record review- foot exam 09/02/19 Continue to have your feet checked at least once per year Continue to follow up with provider Client  states provider checks feet 2 times per year Plan to schedule a foot exam with your health care provider once every year. Check your skin and feet daily for cuts, bruises, redness, blisters or sore.      . Obtain annual screen for micro albuminuria (urine) , nephropathy (kidney problems)       Client states microalbuminuria scheduled to completed on 10/21/19 This is important to have this completed at least once yearly       Plan:  RN care manager sent in basket to [facility]) to place order for health coach.  RN care manager reviewed plan of care with client, mailed successful outreach letter to client's home including consent form and individualized care plan, faxed letter with individualized care plan to primary care provider.  Chronic care management coordination will outreach in:  9-12 months.  Will refer client to:  HTA health coach  Jacqlyn Larsen [facility], BSN [place] Coordinator [phone]     Kassie Mends Nursing/RN Coord [facility] Case Manager, C-SNP

## 2019-11-03 DIAGNOSIS — E1165 Type 2 diabetes mellitus with hyperglycemia: Secondary | ICD-10-CM | POA: Diagnosis not present

## 2019-11-10 DIAGNOSIS — Z794 Long term (current) use of insulin: Secondary | ICD-10-CM | POA: Diagnosis not present

## 2019-11-10 DIAGNOSIS — E1142 Type 2 diabetes mellitus with diabetic polyneuropathy: Secondary | ICD-10-CM | POA: Diagnosis not present

## 2019-11-10 DIAGNOSIS — Z789 Other specified health status: Secondary | ICD-10-CM | POA: Diagnosis not present

## 2019-11-10 DIAGNOSIS — E1169 Type 2 diabetes mellitus with other specified complication: Secondary | ICD-10-CM | POA: Diagnosis not present

## 2019-11-10 DIAGNOSIS — E669 Obesity, unspecified: Secondary | ICD-10-CM | POA: Diagnosis not present

## 2019-11-10 DIAGNOSIS — E1165 Type 2 diabetes mellitus with hyperglycemia: Secondary | ICD-10-CM | POA: Diagnosis not present

## 2019-11-10 DIAGNOSIS — I10 Essential (primary) hypertension: Secondary | ICD-10-CM | POA: Diagnosis not present

## 2019-11-11 NOTE — Progress Notes (Signed)
[name] T. Juan Fulgham, MD, [name]  Primary Care and Sports Medicine [name] [name], [address]  Phone: [phone]  FAX: [place] - 69 y.o. male  MRN [name]  Date of Birth: 1951-01-31  Date: 11/12/2019  PCP: Pleas Koch, NP  Referral: Pleas Koch, NP  Chief Complaint  Patient presents with  . Knee Pain    Bilateral    This visit occurred during the SARS-CoV-2 public health emergency.  Safety protocols were in place, including screening questions prior to the visit, additional usage of staff PPE, and extensive cleaning of exam room while observing appropriate contact time as indicated for disinfecting solutions.   Subjective:   [name] is a 69 y.o. very pleasant male patient with Body mass index is 44.66 kg/m. who presents with the following:  [name] is here in follow-up regarding his bilateral knee pain and osteoarthritis.  I have seen him several times for this in the last few years.   Last time he was here I did do bilateral corticosteroid injections in his knees.  Lasted only 3 weeks.  The radiological images were independently reviewed by myself in the office and results were reviewed with the patient. My independent interpretation of images:   His plain films were reviewed from approximately 1 year ago, and he does have advanced tricompartmental disease with bone-on-bone pathology medially noted left.  Electronically Signed  By: Owens Loffler, MD On: 11/12/2019  8:40 AM EDT   Using his cane some.  This is worsened in the last year.   08/04/2019 Last OV with Owens Loffler, MD  Has lasted about 90 days. Last time. He has B knee OA, and he wanted to get B Knee injections today.   Also, he asks about viscosupplementation and other options for him.   Review of Systems is noted in the HPI, as appropriate   Objective:   BP 140/70   Pulse 86   Temp 98.3 F  (36.8 C) (Temporal)   Ht 5\' 10"  (1.778 m)   Wt (!) 311 lb 4 oz (141.2 kg)   SpO2 95%   BMI 44.66 kg/m   GEN: No acute distress; alert,appropriate. PULM: Breathing comfortably in no respiratory distress PSYCH: Normally interactive.    Bilateral knees with a mild effusion.  Extension lacks 3 degrees and flexion to 110 degrees.  Stable to varus and valgus stress.  ACL and PCL are intact.  Forced flexion and extension cause pain.  Medial greater than lateral joint line tenderness.  Otherwise all bony anatomy and soft tissue are unremarkable and nontender  Radiology: CLINICAL DATA:  69 year old male with left greater than right knee pain.  EXAM: LEFT KNEE - COMPLETE 4+ VIEW  COMPARISON:  Report of [name] left knee radiograph 04/26/1998 (no images available).  FINDINGS: Weightbearing views. Severe medial compartment joint space loss, greater on the left (bone on bone). Tricompartmental degenerative spurring, likely bilateral. Bulky dystrophic calcification near the quadriceps tendon insertion on the patella. Mild patellofemoral joint space loss. No definite joint effusion. No acute osseous abnormality identified. Calcified left femoral artery atherosclerosis.  IMPRESSION: Tricompartmental left knee joint degeneration, severe in the medial compartment.  Sequelae of trauma or degeneration at the quadriceps tendon.   Electronically Signed   By: Genevie Ann M.D.   On: 08/28/2018 16:12  Assessment and Plan:     ICD-10-CM   1. Primary osteoarthritis of knees,  bilateral  M17.0 methylPREDNISolone acetate (DEPO-MEDROL) injection 80 mg    methylPREDNISolone acetate (DEPO-MEDROL) injection 80 mg  2. Morbid obesity with BMI of 40.0-44.9, adult (HCC)  E66.01    Z68.41    Known severe osteoarthritis bilateral knees.  Diminishing returns with corticosteroid injection.  Failure of majority of conservative care.  We discussed possibly thinking about total knee  arthroplasty.  He is going to think on this for right now, and intervally I think that some viscosupplementation would be a good idea prior to any surgery.  We are going to set him up for some Visco injections and go over approval.  Today we will repeat his steroid injections of both of his knees  Aspiration/Injection Procedure Note [name] 10/16/1950 Date of procedure: 11/12/2019  Procedure: Large Joint Joint Aspiration / Injection of the Right Knee Indications: Pain  Procedure Details Patient verbally consented to procedure. Risks (including potential rare risk of infection), benefits, and alternatives explained. Sterilely prepped with Chloraprep. Ethyl cholride used for anesthesia. 8 cc Lidocaine 1% mixed with 2 mL Depo-Medrol 40 mg injected using the anteromedial approach without difficulty. No complications with procedure and tolerated well. Patient had decreased pain post-injection.  Medication: 2 mL of Depo-Medrol 40 mg, equaling Depo-Medrol 80 mg total  Aspiration/Injection Procedure Note [name] 1950-12-10 Date of procedure: 11/12/2019  Procedure: Large Joint Aspiration / Injection of the Left Knee Indications: Pain  Procedure Details Patient verbally consented to procedure. Risks (including potential rare risk of infection), benefits, and alternatives explained. Sterilely prepped with Chloraprep. Ethyl cholride used for anesthesia. 8 cc Lidocaine 1% mixed with 2 mL Depo-Medrol 40 mg injected using the anteromedial approach without difficulty. No complications with procedure and tolerated well. Patient had decreased pain post-injection.  Medication: 2 mL of Depo-Medrol 40 mg, equaling Depo-Medrol 80 mg total  Follow-up: No follow-ups on file.  Meds ordered this encounter  Medications  . methylPREDNISolone acetate (DEPO-MEDROL) injection 80 mg  . methylPREDNISolone acetate (DEPO-MEDROL) injection 80 mg   Medications Discontinued During This Encounter  Medication  Reason  . HUMALOG KWIKPEN 100 UNIT/ML KwikPen Completed Course   No orders of the defined types were placed in this encounter.   Signed,  Maud Deed. Keia Rask, MD   Outpatient Encounter Medications as of 11/12/2019  Medication Sig  . albuterol (PROVENTIL HFA;VENTOLIN HFA) 108 (90 Base) MCG/ACT inhaler Inhale 2 puffs into the lungs every 4 (four) hours as needed for wheezing or shortness of breath.  Marland Kitchen amLODipine (NORVASC) 5 MG tablet Take 5 mg by mouth daily.  Marland Kitchen aspirin EC 81 MG tablet Take 81 mg by mouth daily.  . Continuous Blood Gluc Sensor (FREESTYLE LIBRE 14 DAY SENSOR) MISC SMARTSIG:1 Each Topical Every 2 Weeks  . fenofibrate (TRICOR) 145 MG tablet Take 145 mg by mouth daily.  . hydrochlorothiazide (HYDRODIURIL) 50 MG tablet Take 1 tablet (50 mg total) by mouth daily.  . Insulin Degludec (TRESIBA FLEXTOUCH) 200 UNIT/ML SOPN Inject 70 Units into the skin daily.   Marland Kitchen levothyroxine (SYNTHROID) 50 MCG tablet TAKE 1 TABLET BY MOUTH ONCE DAILY ON AN EMPTY STOMACH WITH A GLASS OF WATER AT LEAST 30 TO 60 MINUTES BEFORE BREAKFAST.  . metFORMIN (GLUCOPHAGE) 1000 MG tablet TAKE 1 TABLET BY MOUTH TWICE A DAY  . NOVOLOG FLEXPEN 100 UNIT/ML FlexPen Inject 28 units before breakfast, inject 24 units beforelunch, inject 34 units before supper. Increase when blood sugar is over 150. Use 2 units more for every 50 over.  . ramipril (ALTACE)  10 MG capsule TAKE 1 CAPSULE BY MOUTH 2 TIMES DAILY  . TRUEPLUS 5-BEVEL PEN NEEDLES 31G X 6 MM MISC   . [DISCONTINUED] HUMALOG KWIKPEN 100 UNIT/ML KwikPen   . [EXPIRED] methylPREDNISolone acetate (DEPO-MEDROL) injection 80 mg   . [EXPIRED] methylPREDNISolone acetate (DEPO-MEDROL) injection 80 mg    No facility-administered encounter medications on file as of 11/12/2019.

## 2019-11-12 ENCOUNTER — Encounter: Payer: Self-pay | Admitting: Family Medicine

## 2019-11-12 ENCOUNTER — Ambulatory Visit (INDEPENDENT_AMBULATORY_CARE_PROVIDER_SITE_OTHER): Payer: HMO | Admitting: Family Medicine

## 2019-11-12 ENCOUNTER — Other Ambulatory Visit: Payer: Self-pay

## 2019-11-12 VITALS — BP 140/70 | HR 86 | Temp 98.3°F | Ht 70.0 in | Wt 311.2 lb

## 2019-11-12 DIAGNOSIS — R0902 Hypoxemia: Secondary | ICD-10-CM | POA: Diagnosis not present

## 2019-11-12 DIAGNOSIS — J449 Chronic obstructive pulmonary disease, unspecified: Secondary | ICD-10-CM | POA: Diagnosis not present

## 2019-11-12 DIAGNOSIS — M170 Bilateral primary osteoarthritis of knee: Secondary | ICD-10-CM | POA: Diagnosis not present

## 2019-11-12 DIAGNOSIS — G4733 Obstructive sleep apnea (adult) (pediatric): Secondary | ICD-10-CM | POA: Diagnosis not present

## 2019-11-12 DIAGNOSIS — Z6841 Body Mass Index (BMI) 40.0 and over, adult: Secondary | ICD-10-CM

## 2019-11-12 MED ORDER — METHYLPREDNISOLONE ACETATE 40 MG/ML IJ SUSP
80.0000 mg | Freq: Once | INTRAMUSCULAR | Status: AC
  Administered 2019-11-12: 80 mg via INTRA_ARTICULAR

## 2019-11-25 ENCOUNTER — Telehealth: Payer: Self-pay

## 2019-11-25 NOTE — Telephone Encounter (Signed)
Mr [name] notified by telephone that his insurance prefers a product that is called Monovisc.Marland Kitchen He may have responsibility if he has not met his out-of-pocket otherwise his insurance would cover at 100%. [name] wanted to call his insurance to find out what his out of pocket would be.   [name] called back to let me know that he was told by his insurance that he would be responsible for 20% of the cost of the medication.  I have sent Ria Comment a message asking for an estimation of what 20% would be for the Monovisc medication.   In the meantime, I have scheduled [name] for next Thursday at 9:00 am with Dr. Lorelei Pont.

## 2019-11-25 NOTE — Telephone Encounter (Signed)
Patient contacted the office stating the he would like to speak with Butch Penny, as he has questions about the Visco injections.

## 2019-11-25 NOTE — Telephone Encounter (Signed)
Per Mendel Ryder it would be 20% of $2,975 for one injection which will run patient $595 each for a total of $1190 for bilateral knee injections.

## 2019-11-25 NOTE — Telephone Encounter (Signed)
[name] notified by telephone about cost of Monovisc injections.  Patient states he can't afford that.  Appointment cancelled.

## 2019-11-27 ENCOUNTER — Telehealth: Payer: Self-pay

## 2019-11-27 DIAGNOSIS — E119 Type 2 diabetes mellitus without complications: Secondary | ICD-10-CM | POA: Diagnosis not present

## 2019-11-27 LAB — HM DIABETES EYE EXAM

## 2019-11-27 NOTE — Telephone Encounter (Signed)
Spoke with Otila Kluver with [employer] and completed PA via telephone.  She has sent it for review and we will be notified of decision via fax.

## 2019-11-27 NOTE — Telephone Encounter (Signed)
[name] with [employer] left v/m requesting cb about [name] working on authorization for med. Ref # [id number].

## 2019-12-01 ENCOUNTER — Telehealth: Payer: Self-pay | Admitting: Primary Care

## 2019-12-01 NOTE — Progress Notes (Signed)
  Chronic Care Management   Note  12/01/2019 Name: RISHAB BIAGIONI MRN: [name] DOB: 01-09-1951  Alyson Locket Quincey is a 69 y.o. year old male who is a primary care patient of Pleas Koch, NP. I reached out to [name] by phone today in response to a referral sent by Mr. Kentavious Grabinski Clouatre's PCP, Pleas Koch, NP.   Mr. Keyton was given information about Chronic Care Management services today including:  1. CCM service includes personalized support from designated clinical staff supervised by his physician, including individualized plan of care and coordination with other care providers 2. 24/7 contact phone numbers for assistance for urgent and routine care needs. 3. Service will only be billed when office clinical staff spend 20 minutes or more in a month to coordinate care. 4. Only one practitioner may furnish and bill the service in a calendar month. 5. The patient may stop CCM services at any time (effective at the end of the month) by phone call to the office staff.   Patient agreed to services and verbal consent obtained.   This note is not being shared with the patient for the following reason: To respect privacy (The patient or proxy has requested that the information not be shared).  Follow up plan:   This note is not being shared with the patient for the following reason:  Earney Hamburg [employer] Scheduler

## 2019-12-03 ENCOUNTER — Encounter: Payer: Self-pay | Admitting: Podiatry

## 2019-12-03 ENCOUNTER — Ambulatory Visit: Payer: HMO | Admitting: Podiatry

## 2019-12-03 ENCOUNTER — Other Ambulatory Visit: Payer: Self-pay

## 2019-12-03 VITALS — Temp 97.6°F

## 2019-12-03 DIAGNOSIS — M79675 Pain in left toe(s): Secondary | ICD-10-CM

## 2019-12-03 DIAGNOSIS — M79674 Pain in right toe(s): Secondary | ICD-10-CM | POA: Diagnosis not present

## 2019-12-03 DIAGNOSIS — E119 Type 2 diabetes mellitus without complications: Secondary | ICD-10-CM

## 2019-12-03 DIAGNOSIS — Z794 Long term (current) use of insulin: Secondary | ICD-10-CM | POA: Diagnosis not present

## 2019-12-03 DIAGNOSIS — B351 Tinea unguium: Secondary | ICD-10-CM

## 2019-12-03 DIAGNOSIS — L84 Corns and callosities: Secondary | ICD-10-CM | POA: Diagnosis not present

## 2019-12-03 NOTE — Patient Instructions (Signed)
Diabetes Mellitus and Foot Care Foot care is an important part of your health, especially when you have diabetes. Diabetes may cause you to have problems because of poor blood flow (circulation) to your feet and legs, which can cause your skin to:  Become thinner and drier.  Break more easily.  Heal more slowly.  Peel and crack. You may also have nerve damage (neuropathy) in your legs and feet, causing decreased feeling in them. This means that you may not notice minor injuries to your feet that could lead to more serious problems. Noticing and addressing any potential problems early is the best way to prevent future foot problems. How to care for your feet Foot hygiene  Wash your feet daily with warm water and mild soap. Do not use hot water. Then, pat your feet and the areas between your toes until they are completely dry. Do not soak your feet as this can dry your skin.  Trim your toenails straight across. Do not dig under them or around the cuticle. File the edges of your nails with an emery board or nail file.  Apply a moisturizing lotion or petroleum jelly to the skin on your feet and to dry, brittle toenails. Use lotion that does not contain alcohol and is unscented. Do not apply lotion between your toes. Shoes and socks  Wear clean socks or stockings every day. Make sure they are not too tight. Do not wear knee-high stockings since they may decrease blood flow to your legs.  Wear shoes that fit properly and have enough cushioning. Always look in your shoes before you put them on to be sure there are no objects inside.  To break in new shoes, wear them for just a few hours a day. This prevents injuries on your feet. Wounds, scrapes, corns, and calluses  Check your feet daily for blisters, cuts, bruises, sores, and redness. If you cannot see the bottom of your feet, use a mirror or ask someone for help.  Do not cut corns or calluses or try to remove them with medicine.  If you  find a minor scrape, cut, or break in the skin on your feet, keep it and the skin around it clean and dry. You may clean these areas with mild soap and water. Do not clean the area with peroxide, alcohol, or iodine.  If you have a wound, scrape, corn, or callus on your foot, look at it several times a day to make sure it is healing and not infected. Check for: ? Redness, swelling, or pain. ? Fluid or blood. ? Warmth. ? Pus or a bad smell. General instructions  Do not cross your legs. This may decrease blood flow to your feet.  Do not use heating pads or hot water bottles on your feet. They may burn your skin. If you have lost feeling in your feet or legs, you may not know this is happening until it is too late.  Protect your feet from hot and cold by wearing shoes, such as at the beach or on hot pavement.  Schedule a complete foot exam at least once a year (annually) or more often if you have foot problems. If you have foot problems, report any cuts, sores, or bruises to your health care provider immediately. Contact a health care provider if:  You have a medical condition that increases your risk of infection and you have any cuts, sores, or bruises on your feet.  You have an injury that is not   healing.  You have redness on your legs or feet.  You feel burning or tingling in your legs or feet.  You have pain or cramps in your legs and feet.  Your legs or feet are numb.  Your feet always feel cold.  You have pain around a toenail. Get help right away if:  You have a wound, scrape, corn, or callus on your foot and: ? You have pain, swelling, or redness that gets worse. ? You have fluid or blood coming from the wound, scrape, corn, or callus. ? Your wound, scrape, corn, or callus feels warm to the touch. ? You have pus or a bad smell coming from the wound, scrape, corn, or callus. ? You have a fever. ? You have a red line going up your leg. Summary  Check your feet every day  for cuts, sores, red spots, swelling, and blisters.  Moisturize feet and legs daily.  Wear shoes that fit properly and have enough cushioning.  If you have foot problems, report any cuts, sores, or bruises to your health care provider immediately.  Schedule a complete foot exam at least once a year (annually) or more often if you have foot problems. This information is not intended to replace advice given to you by your health care provider. Make sure you discuss any questions you have with your health care provider. Document Revised: 03/26/2019 Document Reviewed: 08/04/2016 Elsevier Patient Education  2020 Elsevier Inc.  

## 2019-12-03 NOTE — Progress Notes (Signed)
Subjective: [name] presents today preventative diabetic foot care and callus(es) right foot and painful mycotic toenails b/l that are difficult to trim. Pain interferes with ambulation. Aggravating factors include wearing enclosed shoe gear. Pain is relieved with periodic professional debridement.   He states he has been having pain in his right knee and trying to figure out a plan with his Orthopedic surgeon.   He voices no new pedal problems on today's visit.  Pleas Koch, NP is patient's PCP.   Past Medical History:  Diagnosis Date  . Arthritis    knee  . Colon polyp 2008  . COPD with emphysema ([place])   . Diabetes mellitus   . Hypercholesteremia   . Hypertension   . Hypothyroidism   . Observed sleep apnea      Current Outpatient Medications on File Prior to Visit  Medication Sig Dispense Refill  . albuterol (PROVENTIL HFA;VENTOLIN HFA) 108 (90 Base) MCG/ACT inhaler Inhale 2 puffs into the lungs every 4 (four) hours as needed for wheezing or shortness of breath. 1 Inhaler 0  . amLODipine (NORVASC) 5 MG tablet Take 5 mg by mouth daily.    Marland Kitchen aspirin EC 81 MG tablet Take 81 mg by mouth daily.    . Continuous Blood Gluc Sensor (FREESTYLE LIBRE 14 DAY SENSOR) MISC SMARTSIG:1 Each Topical Every 2 Weeks    . fenofibrate (TRICOR) 145 MG tablet Take 145 mg by mouth daily.    . hydrochlorothiazide (HYDRODIURIL) 50 MG tablet Take 1 tablet (50 mg total) by mouth daily. 90 tablet 1  . Insulin Degludec (TRESIBA FLEXTOUCH) 200 UNIT/ML SOPN Inject 70 Units into the skin daily.     Marland Kitchen levothyroxine (SYNTHROID) 50 MCG tablet TAKE 1 TABLET BY MOUTH ONCE DAILY ON AN EMPTY STOMACH WITH A GLASS OF WATER AT LEAST 30 TO 60 MINUTES BEFORE BREAKFAST. 90 tablet 1  . metFORMIN (GLUCOPHAGE) 1000 MG tablet TAKE 1 TABLET BY MOUTH TWICE A DAY 180 tablet 1  . NOVOLOG FLEXPEN 100 UNIT/ML FlexPen Inject 28 units before breakfast, inject 24 units beforelunch, inject 34 units before supper. Increase when  blood sugar is over 150. Use 2 units more for every 50 over.  9  . ramipril (ALTACE) 10 MG capsule TAKE 1 CAPSULE BY MOUTH 2 TIMES DAILY 180 capsule 1  . TRUEPLUS 5-BEVEL PEN NEEDLES 31G X 6 MM MISC      No current facility-administered medications on file prior to visit.     Allergies  Allergen Reactions  . Atorvastatin     Other reaction(s): Headache  . Other Other (See Comments)    STATIN DRUG-CAUSED MUSCLE PAIN/LETHARGIC    Objective: [name] is a pleasant 69 y.o. y.o. Patient Race: White or Caucasian [1]  male, morbidly obese, in NAD. AAO x 3.  Vitals:   12/03/19 0946  Temp: 97.6 F (36.4 C)    Vascular Examination: Neurovascular status unchanged b/l. Capillary fill time to digits <3 seconds b/l. Palpable PT pulses b/l. Faintly palpable DP pulses b/l. Pedal hair sparse b/l. Skin temperature gradient within normal limits b/l. No edema noted b/l.  Dermatological Examination: Pedal skin with normal turgor, texture and tone bilaterally. No open wounds bilaterally. No interdigital macerations bilaterally. Toenails 1-5 b/l elongated, dystrophic, thickened, crumbly with subungual debris and tenderness to dorsal palpation. Hyperkeratotic lesion(s) submet head 3 right foot and submet head 5 right foot.  No erythema, no edema, no drainage, no flocculence.  Musculoskeletal: Normal muscle strength 5/5 to all lower extremity  muscle groups bilaterally. No pain crepitus or joint limitation noted with ROM b/l. Hammertoes noted to the 1-5 bilaterally.  Neurological Examination: Protective sensation intact 5/5 intact bilaterally with 10g monofilament b/l. Vibratory sensation intact b/l. Proprioception intact bilaterally.  Assessment: 1. Pain due to onychomycosis of toenails of both feet   2. Callus   3. Type 2 diabetes mellitus without complication, with long-term current use of insulin ([place])    Plan: -Examined patient. -No new findings. No new orders. -Continue diabetic foot  care principles. Literature dispensed on today.  -Toenails 1-5 b/l were debrided in length and girth with sterile nail nippers and dremel without iatrogenic bleeding.  -Callus(es) submet head 3 right foot and submet head 5 right foot pared utilizing sterile scalpel blade without complication or incident. Total number debrided =2. -Patient to continue soft, supportive shoe gear daily. -Patient to report any pedal injuries to medical professional immediately. -Patient/POA to call should there be question/concern in the interim.  Return in about 3 months (around 03/04/2020) for diabetic nail and callus trim.  Marzetta Board, DPM

## 2019-12-04 ENCOUNTER — Ambulatory Visit: Payer: HMO | Admitting: Family Medicine

## 2019-12-12 ENCOUNTER — Encounter: Payer: Self-pay | Admitting: Primary Care

## 2019-12-12 DIAGNOSIS — G4733 Obstructive sleep apnea (adult) (pediatric): Secondary | ICD-10-CM | POA: Diagnosis not present

## 2019-12-12 DIAGNOSIS — J449 Chronic obstructive pulmonary disease, unspecified: Secondary | ICD-10-CM | POA: Diagnosis not present

## 2019-12-12 DIAGNOSIS — R0902 Hypoxemia: Secondary | ICD-10-CM | POA: Diagnosis not present

## 2020-01-05 ENCOUNTER — Telehealth: Payer: Self-pay | Admitting: Primary Care

## 2020-01-05 NOTE — Telephone Encounter (Signed)
Pt's wife dropped off disability parking placard form to be filled out. Place form in RX tower for [place]. Please call patient when completed.

## 2020-01-05 NOTE — Telephone Encounter (Signed)
Place form in [place]

## 2020-01-06 DIAGNOSIS — J449 Chronic obstructive pulmonary disease, unspecified: Secondary | ICD-10-CM | POA: Diagnosis not present

## 2020-01-06 DIAGNOSIS — G4733 Obstructive sleep apnea (adult) (pediatric): Secondary | ICD-10-CM | POA: Diagnosis not present

## 2020-01-06 NOTE — Telephone Encounter (Signed)
Completed and placed in [place] inbox. 

## 2020-01-06 NOTE — Telephone Encounter (Signed)
Form left in the front office. Patient have been notified.

## 2020-01-08 ENCOUNTER — Other Ambulatory Visit: Payer: Self-pay

## 2020-01-08 ENCOUNTER — Encounter: Payer: Self-pay | Admitting: Primary Care

## 2020-01-08 ENCOUNTER — Ambulatory Visit (INDEPENDENT_AMBULATORY_CARE_PROVIDER_SITE_OTHER): Payer: HMO | Admitting: Primary Care

## 2020-01-08 DIAGNOSIS — D229 Melanocytic nevi, unspecified: Secondary | ICD-10-CM | POA: Diagnosis not present

## 2020-01-08 DIAGNOSIS — L821 Other seborrheic keratosis: Secondary | ICD-10-CM | POA: Diagnosis not present

## 2020-01-08 NOTE — Progress Notes (Signed)
Subjective:    Patient ID: [name], male    DOB: 05/05/1951, 69 y.o.   MRN: [id number]  HPI  This visit occurred during the SARS-CoV-2 public health emergency.  Safety protocols were in place, including screening questions prior to the visit, additional usage of staff PPE, and extensive cleaning of exam room while observing appropriate contact time as indicated for disinfecting solutions.   [name] is a 69 year old male with a history of hypertension, COPD, GERD, hypothyroidism, diabetes, dermatofibroma of lower extremity, osteoarthritis of knees, hyperlipidemia who presents today with a chief complaint of nevus.   The nevus is located to the right mid thoracic back which has been chronic for years. About one week ago his wife noticed a changed to the nevus which became "dried and flattened" with a darker color noted.   He denies pain, injury to the site.   Review of Systems  Constitutional: Negative for fever.  Skin: Negative for rash and wound.       Nevus       Past Medical History:  Diagnosis Date  . Arthritis    knee  . Colon polyp 2008  . COPD with emphysema ([place])   . Diabetes mellitus   . Hypercholesteremia   . Hypertension   . Hypothyroidism   . Observed sleep apnea      Social History   Socioeconomic History  . Marital status: Married    Spouse name: Not on file  . Number of children: Not on file  . Years of education: Not on file  . Highest education level: Not on file  Occupational History  . Not on file  Tobacco Use  . Smoking status: Former Smoker    Packs/day: 2.00    Years: 35.00    Pack years: 70.00    Types: Cigarettes, E-cigarettes    Quit date: 12/11/2014    Years since quitting: 5.0  . Smokeless tobacco: Never Used  Vaping Use  . Vaping Use: Never used  Substance and Sexual Activity  . Alcohol use: No    Alcohol/week: 0.0 standard drinks  . Drug use: No  . Sexual activity: Not on file  Other Topics Concern  . Not on file    Social History Narrative   Married.   3 children. 2 grandchildren.   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    Social Determinants of Health   Financial Resource Strain:   . Difficulty of Paying Living Expenses:   Food Insecurity:   . Worried About Charity fundraiser in the Last Year:   . Arboriculturist in the Last Year:   Transportation Needs:   . Film/video editor (Medical):   Marland Kitchen Lack of Transportation (Non-Medical):   Physical Activity:   . Days of Exercise per Week:   . Minutes of Exercise per Session:   Stress:   . Feeling of Stress :   Social Connections:   . Frequency of Communication with Friends and Family:   . Frequency of Social Gatherings with Friends and Family:   . Attends Religious Services:   . Active Member of Clubs or Organizations:   . Attends Archivist Meetings:   Marland Kitchen Marital Status:   Intimate Partner Violence:   . Fear of Current or Ex-Partner:   . Emotionally Abused:   Marland Kitchen Physically Abused:   . Sexually Abused:     Past Surgical  History:  Procedure Laterality Date  . COLONOSCOPY  2008, 2016   Dr Bary Castilla  . COLONOSCOPY WITH PROPOFOL N/A 10/03/2017   Procedure: COLONOSCOPY WITH PROPOFOL;  Surgeon: Robert Bellow, MD;  Location: [facility];  Service: Endoscopy;  Laterality: N/A;  . DERMATOFIBROMA  03/26/15   Procedure: Excision mass left leg;  Surgeon: Robert Bellow, MD;  Location: [facility] ORS;  Service: General;  Laterality: N/A;  . LIPOMA EXCISION N/A 03/26/2015      . TONSILLECTOMY      Family History  Problem Relation Age of Onset  . Stroke Mother   . Diabetes Mother   . Stroke Father   . Diabetes Other   . Other Daughter 25       precancerous colon polyp   . Diabetes Maternal Grandmother   . Diabetes Paternal Grandmother     Allergies  Allergen Reactions  . Atorvastatin     Other reaction(s): Headache  . Other Other (See Comments)    STATIN DRUG-CAUSED  MUSCLE PAIN/LETHARGIC    Current Outpatient Medications on File Prior to Visit  Medication Sig Dispense Refill  . albuterol (PROVENTIL HFA;VENTOLIN HFA) 108 (90 Base) MCG/ACT inhaler Inhale 2 puffs into the lungs every 4 (four) hours as needed for wheezing or shortness of breath. 1 Inhaler 0  . amLODipine (NORVASC) 5 MG tablet Take 5 mg by mouth daily.    Marland Kitchen aspirin EC 81 MG tablet Take 81 mg by mouth daily.    . Continuous Blood Gluc Sensor (FREESTYLE LIBRE 14 DAY SENSOR) MISC SMARTSIG:1 Each Topical Every 2 Weeks    . fenofibrate (TRICOR) 145 MG tablet Take 145 mg by mouth daily.    . hydrochlorothiazide (HYDRODIURIL) 50 MG tablet Take 1 tablet (50 mg total) by mouth daily. 90 tablet 1  . Insulin Degludec (TRESIBA FLEXTOUCH) 200 UNIT/ML SOPN Inject 70 Units into the skin daily.     Marland Kitchen levothyroxine (SYNTHROID) 50 MCG tablet TAKE 1 TABLET BY MOUTH ONCE DAILY ON AN EMPTY STOMACH WITH A GLASS OF WATER AT LEAST 30 TO 60 MINUTES BEFORE BREAKFAST. 90 tablet 1  . metFORMIN (GLUCOPHAGE) 1000 MG tablet TAKE 1 TABLET BY MOUTH TWICE A DAY 180 tablet 1  . NOVOLOG FLEXPEN 100 UNIT/ML FlexPen Inject 28 units before breakfast, inject 24 units beforelunch, inject 34 units before supper. Increase when blood sugar is over 150. Use 2 units more for every 50 over.  9  . ramipril (ALTACE) 10 MG capsule TAKE 1 CAPSULE BY MOUTH 2 TIMES DAILY 180 capsule 1  . TRUEPLUS 5-BEVEL PEN NEEDLES 31G X 6 MM MISC      No current facility-administered medications on file prior to visit.    BP 124/70   Pulse 70   Temp (!) 96 F (35.6 C) (Temporal)   Ht 5\' 10"  (1.778 m)   Wt (!) 313 lb 12 oz (142.3 kg)   SpO2 98%   BMI 45.02 kg/m    Objective:   Physical Exam  Respiratory: Effort normal.  Neurological: He is alert.  Skin: Skin is warm and dry.  1 cm rounded, raised, dark brown, nevus to right thoracic mid back. No surrounding erythema.            Assessment & Plan:

## 2020-01-08 NOTE — Patient Instructions (Signed)
Keep the site bandaged and dry.  Please call me if you develop a lot of redness, any drainage, fevers, increased pain.  It was a pleasure to see you today!

## 2020-01-08 NOTE — Assessment & Plan Note (Signed)
Chronic with acute changes. Based off of exam it appears that it may have been snagged as part of it appears detached.   He requests removal . He signed a consent form. Site was prepped with betadine. He preferred Pain Ease for analgesia, I offered lidocaine with Epi but he declined. Mole removed with shave biopsy tool. Some bleeding which stopped with silver nitrate sticks. He tolerated well. Sending nevus off for pathology. Site cleansed again, dressing applied.   Return precautions provided.

## 2020-01-08 NOTE — Addendum Note (Signed)
Addended by: Jacqualin Combes on: 01/08/2020 09:37 AM   Modules accepted: Orders

## 2020-01-12 DIAGNOSIS — G4733 Obstructive sleep apnea (adult) (pediatric): Secondary | ICD-10-CM | POA: Diagnosis not present

## 2020-01-12 DIAGNOSIS — J449 Chronic obstructive pulmonary disease, unspecified: Secondary | ICD-10-CM | POA: Diagnosis not present

## 2020-01-12 DIAGNOSIS — R0902 Hypoxemia: Secondary | ICD-10-CM | POA: Diagnosis not present

## 2020-01-15 IMAGING — CT CT CHEST LUNG CANCER SCREENING LOW DOSE W/O CM
2 of 5 series · 15 of 40 positions shown, 18 images · non-contrast
Comparison: 06/28/2018

CLINICAL DATA: Ex-smoker, quitting 4 years ago. Seventy pack-year
history.

EXAM:
CT CHEST WITHOUT CONTRAST LOW-DOSE FOR LUNG CANCER SCREENING
TECHNIQUE: Multidetector CT imaging of the chest was performed following the
standard protocol without IV contrast.

[Series 3: lung 1.00 · axial · 0.78mm/px · z∈[-1191,-900]mm · 12 of 325 slices shown, 15 images]
[im 17/325  mediastinal]
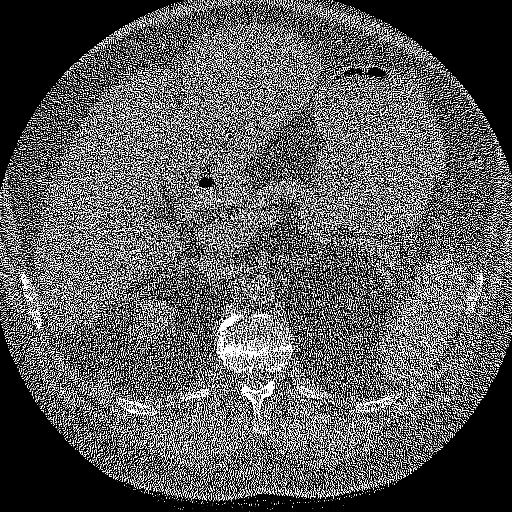
[im 17/325  lung]
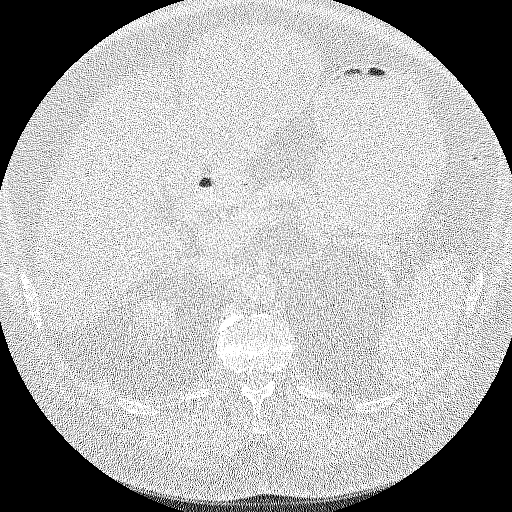
[im 49/325  lung]
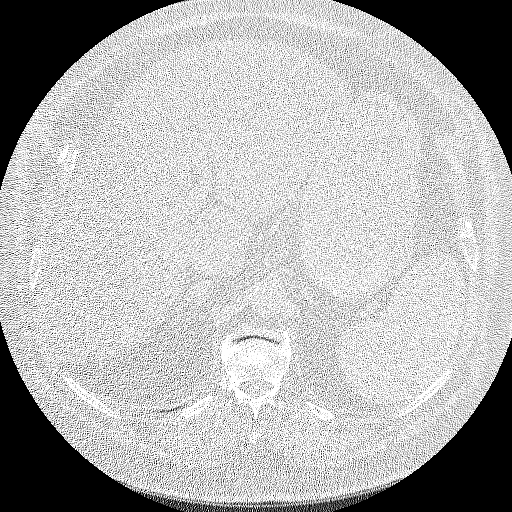
[im 65/325  lung]
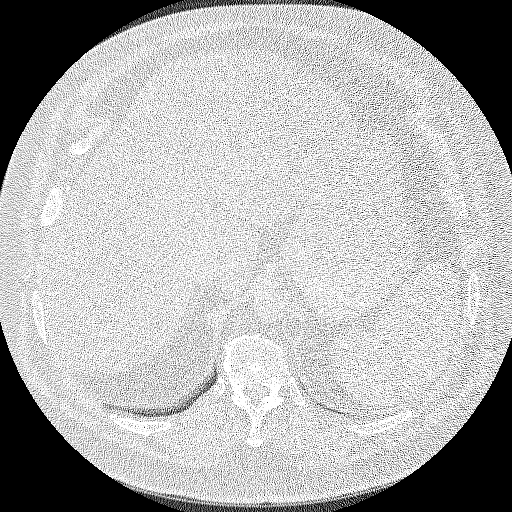
[im 98/325  lung]
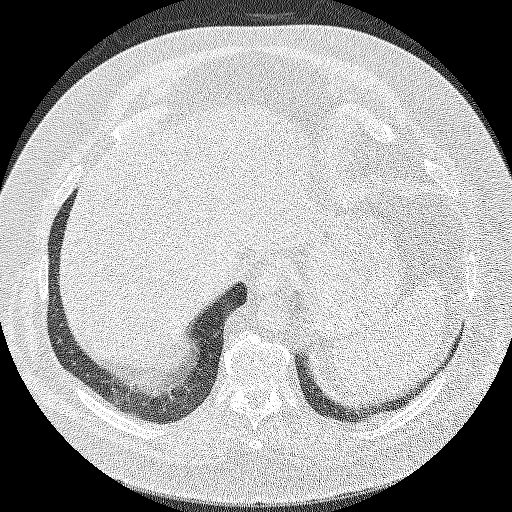
[im 130/325  mediastinal]
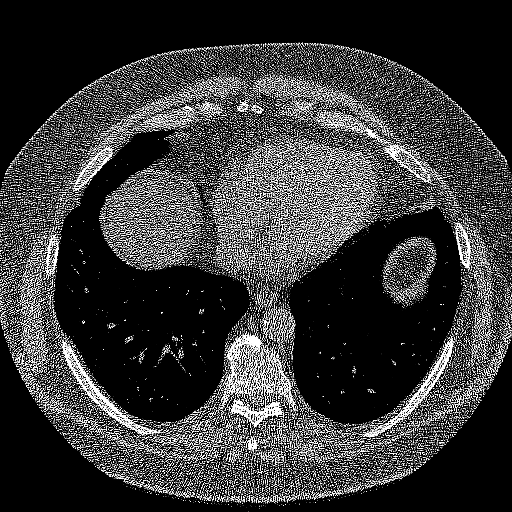
[im 130/325  lung]
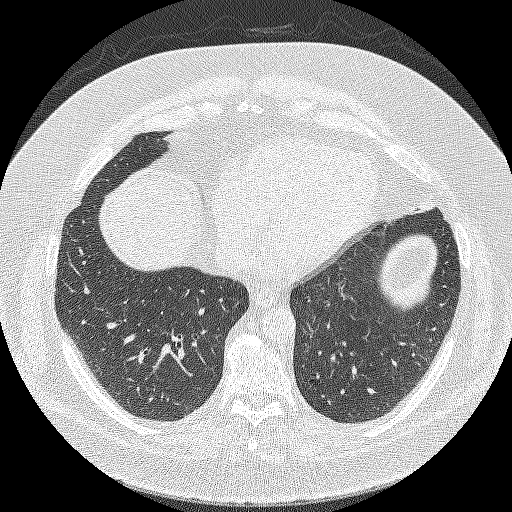
[im 146/325  lung]
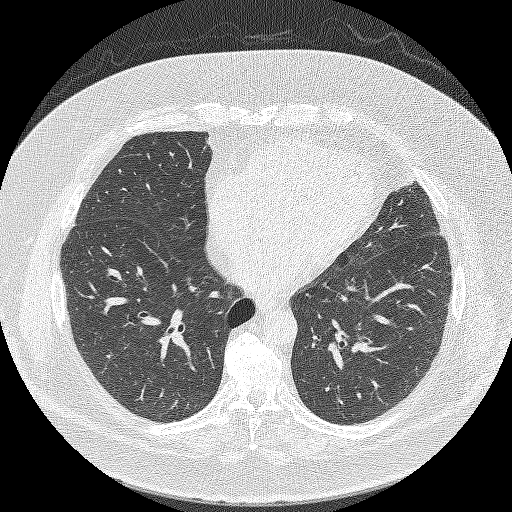
[im 179/325  lung]
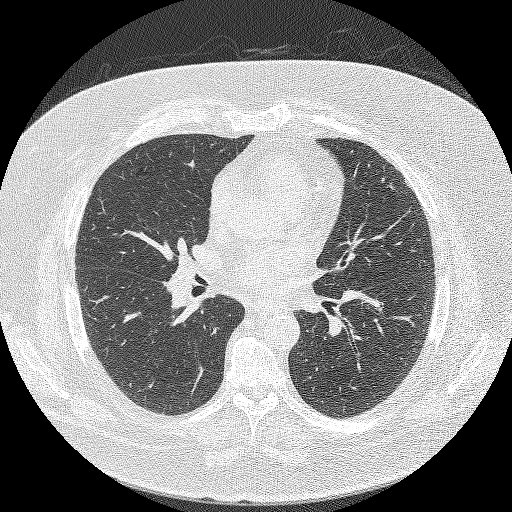
[im 195/325  lung]
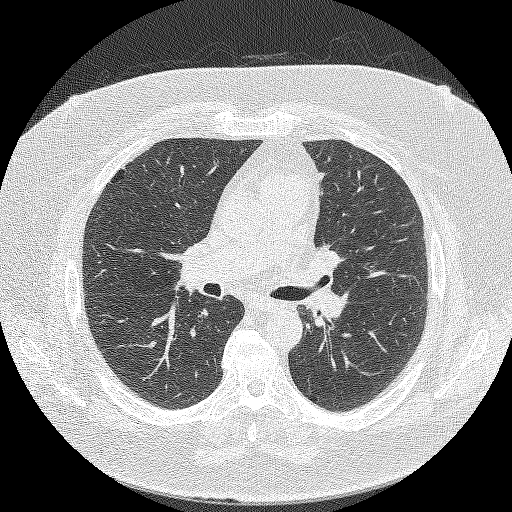
[im 227/325  mediastinal]
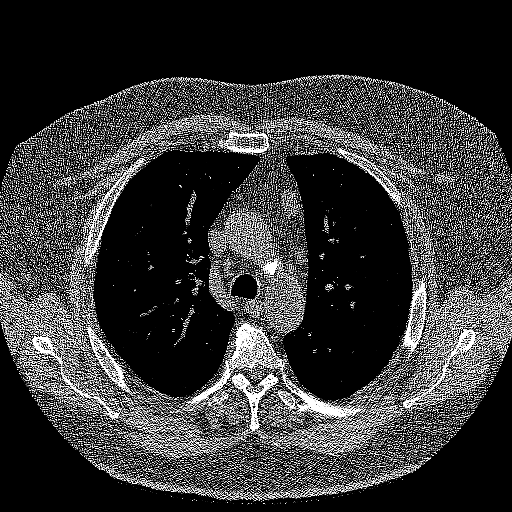
[im 227/325  lung]
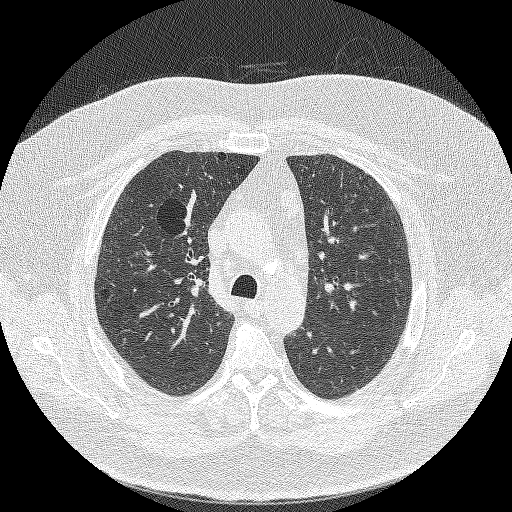
[im 260/325  lung]
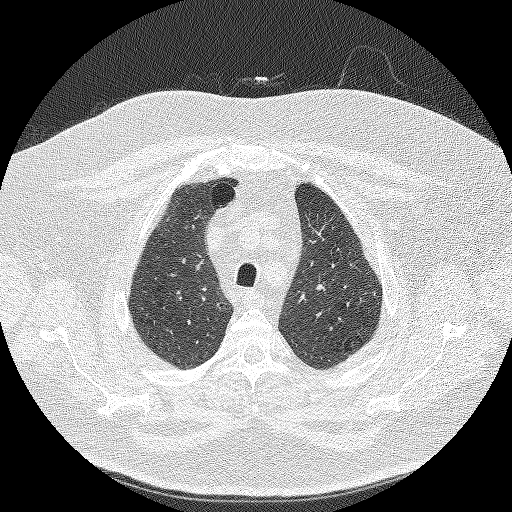
[im 276/325  lung]
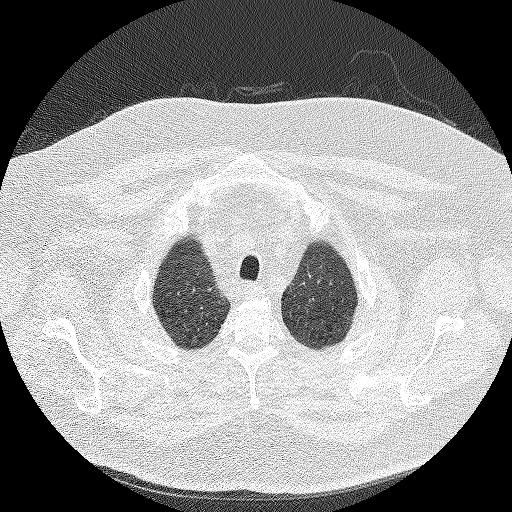
[im 308/325  lung]
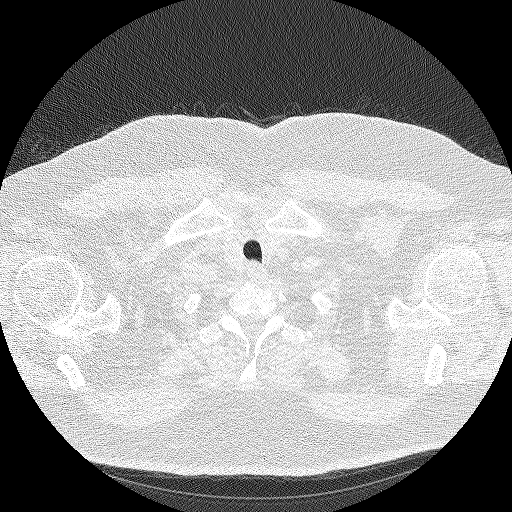

[Series 4: coronals lung 1.00 cor · coronal · 0.63mm/px · 3 of 382 slices shown]
[im 77/382  lung]
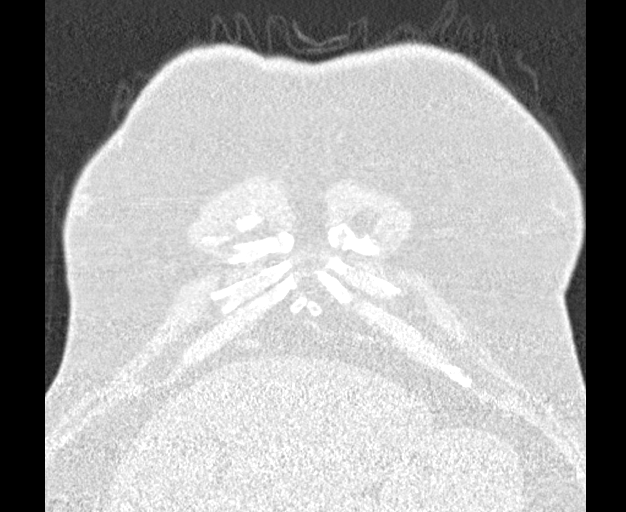
[im 153/382  lung]
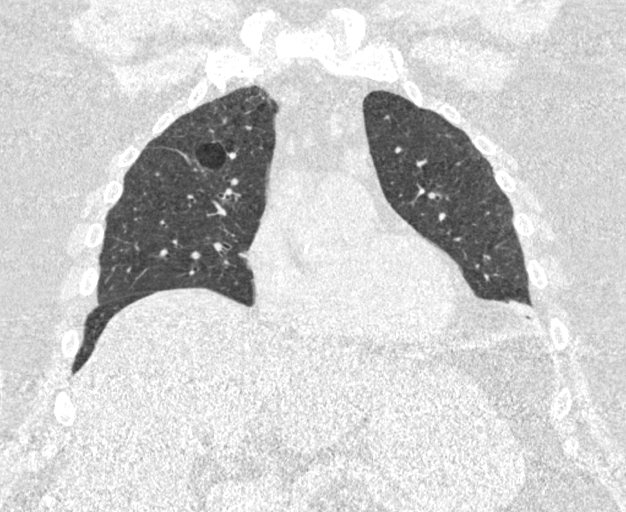
[im 229/382  lung]
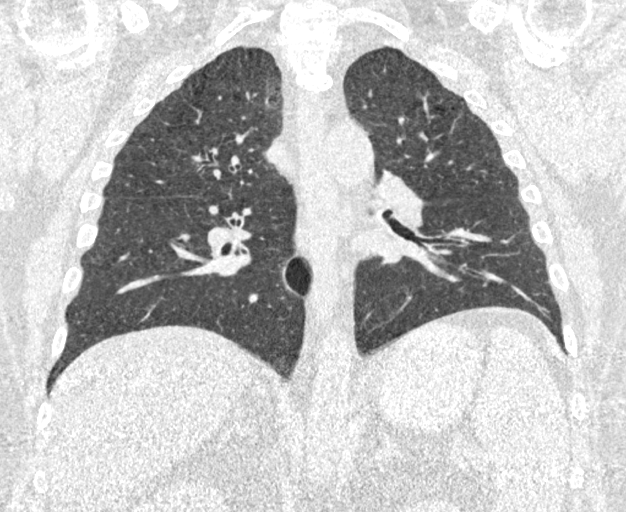

[15 of 40 positions shown; findings below may reference images not displayed]

FINDINGS: Cardiovascular: Aortic atherosclerosis. Borderline cardiomegaly,
with mild lipomatous hypertrophy of the interatrial septum. No
pericardial effusion. Multivessel coronary artery atherosclerosis.

Mediastinum/Nodes: No middle mediastinal adenopathy. Hilar regions
poorly evaluated without intravenous contrast. Multiple prominent
prevascular nodes, with an index 1.2 cm node, unchanged on [DATE].

Lungs/Pleura: No pleural fluid. Mild to moderate centrilobular and
paraseptal emphysema. Minimal motion degradation inferiorly.

Bilateral pulmonary nodules of maximally volume derived equivalent
diameter 7.0 mm in the left upper lobe. No dominant or highly
suspicious

Upper Abdomen: Marked hepatic steatosis. Normal imaged portions of
the spleen, stomach, pancreas, adrenal glands, kidneys.

Musculoskeletal: Upper thoracic spondylosis.
IMPRESSION: 1. Lung-RADS 2, benign appearance or behavior. Continue annual
screening with low-dose chest CT without contrast in 12 months.
2. Hepatic steatosis.
3. Aortic atherosclerosis (1ZS2Y-IDW.W), coronary artery
atherosclerosis and emphysema (1ZS2Y-A82.Q).

## 2020-01-16 ENCOUNTER — Telehealth: Payer: HMO

## 2020-01-16 ENCOUNTER — Telehealth: Payer: Self-pay | Admitting: Primary Care

## 2020-01-16 NOTE — Telephone Encounter (Signed)
Please call patient and notify him that we were contacted by pharmacist who recommends statin therapy given his diabetes.  We see that he has been tried on atorvastatin in the past, has he tried anything else such as pravastatin or rosuvastatin?  Would he be willing to?  Did he have any other reactions besides headache to the statin medicine in the past?

## 2020-01-16 NOTE — Telephone Encounter (Signed)
-----   Message from Cathi Roan, [facility] sent at 01/16/2020 12:12 PM EDT ----- Liam Rogers,   I am a [facility] clinical pharmacist reviewing patients with current gaps for statin use in diabetes (SUPD) metric.   Per review of chart and payor information, this patient has a diagnosis of diabetes and ASCVD but is not currently prescribed a statin.    Patient has documented trials of statin intolerance (atorvastatin), but no corresponding CPT codes that would exclude patient from SUPD / SPC measures.    If you could add one of the following applicable measures to one of your office visits from this calendar year, that would close the gap. Thank you for all you do for our patients.    Drug-induced myopathy (G72.0) Myopathy, unspecified (G72.9) Myositis, unspecified (M60.9) Rhabdomyolysis (M62.82)   Please let me know if you have any questions!   Ruben Reason, [place] [phone]

## 2020-01-16 NOTE — Telephone Encounter (Signed)
Spoken and notified patient of Tawni Millers comments.  Patient stated that he tried a couple statin in the past but can't seem to remember which one. He decline on trying any more.  He stated that it made him nausea and fatigue. Patient stated that he willing discuss further at physical in the fall.

## 2020-01-16 NOTE — Telephone Encounter (Signed)
Noted  

## 2020-02-09 ENCOUNTER — Other Ambulatory Visit: Payer: Self-pay | Admitting: Primary Care

## 2020-02-09 DIAGNOSIS — E038 Other specified hypothyroidism: Secondary | ICD-10-CM

## 2020-02-10 DIAGNOSIS — E669 Obesity, unspecified: Secondary | ICD-10-CM | POA: Diagnosis not present

## 2020-02-10 DIAGNOSIS — E1169 Type 2 diabetes mellitus with other specified complication: Secondary | ICD-10-CM | POA: Diagnosis not present

## 2020-02-11 DIAGNOSIS — R0902 Hypoxemia: Secondary | ICD-10-CM | POA: Diagnosis not present

## 2020-02-11 DIAGNOSIS — G4733 Obstructive sleep apnea (adult) (pediatric): Secondary | ICD-10-CM | POA: Diagnosis not present

## 2020-02-11 DIAGNOSIS — J449 Chronic obstructive pulmonary disease, unspecified: Secondary | ICD-10-CM | POA: Diagnosis not present

## 2020-02-16 ENCOUNTER — Ambulatory Visit
Admission: EM | Admit: 2020-02-16 | Discharge: 2020-02-16 | Disposition: A | Discharge status: Home / Self Care | Payer: HMO | Attending: Emergency Medicine | Admitting: Emergency Medicine

## 2020-02-16 ENCOUNTER — Other Ambulatory Visit: Payer: Self-pay

## 2020-02-16 DIAGNOSIS — J0100 Acute maxillary sinusitis, unspecified: Secondary | ICD-10-CM

## 2020-02-16 DIAGNOSIS — Z7689 Persons encountering health services in other specified circumstances: Secondary | ICD-10-CM | POA: Diagnosis not present

## 2020-02-16 MED ORDER — AMOXICILLIN 875 MG PO TABS
875.0000 mg | ORAL_TABLET | Freq: Two times a day (BID) | ORAL | 0 refills | Status: AC
Start: 2020-02-16 — End: 2020-02-23

## 2020-02-16 NOTE — Discharge Instructions (Signed)
Take the amoxicillin as directed.  Follow up with your primary care provider if your symptoms are not improving.   ° ° °

## 2020-02-16 NOTE — ED Triage Notes (Signed)
Pt presents to UC for sinus pressure, post nasal drip, and productive cough with green phlegm x3 days. Pt denies fever or sore throat. Pt denies n/v/d. Pt has been treating with cough syrup with out relief. Pt agreeable to covid testing at this time.

## 2020-02-16 NOTE — ED Provider Notes (Signed)
[name]    CSN: [id number] Arrival date & time: 02/16/20  1031      History   Chief Complaint Chief Complaint  Patient presents with  . Cough    HPI [name] is a 69 y.o. male.   Patient presents with sinus pressure, sinus drainage, cough productive of green phlegm x3 days.  Treatment attempted at home with OTC cough syrup.  He denies fever, chills, rash, shortness of breath, vomiting, diarrhea, or other symptoms.  Patient's medical history includes former smoker, COPD, morbid obesity.  The history is provided by the patient.    Past Medical History:  Diagnosis Date  . Arthritis    knee  . Colon polyp 2008  . COPD with emphysema ([place])   . Diabetes mellitus   . Hypercholesteremia   . Hypertension   . Hypothyroidism   . Observed sleep apnea     Patient Active Problem List   Diagnosis Date Noted  . Nevus 01/08/2020  . Welcome to Medicare preventive visit 09/25/2017  . Personal history of tobacco use, presenting hazards to health 06/30/2016  . Preventative health care 06/05/2016  . Osteoarthritis of both knees 11/09/2015  . Diabetes mellitus ([place]) 06/14/2015  . Allergic rhinitis 05/28/2015  . Morbid obesity with BMI of 40.0-44.9, adult ([place]) 05/28/2015  . Chronic constipation 05/28/2015  . Chronic obstructive pulmonary disease ([place]) 05/28/2015  . Essential (primary) hypertension 05/28/2015  . Hypercholesterolemia 05/28/2015  . Gastro-esophageal reflux disease without esophagitis 05/28/2015  . Subclinical hypothyroidism 05/28/2015  . Dermatofibroma of left lower leg 04/01/2015  . Former smoker 03/04/2015  . History of colonic polyps 08/20/2014  . Idiopathic localized osteoarthropathy 03/16/2014    Past Surgical History:  Procedure Laterality Date  . COLONOSCOPY  2008, 2016   Dr Bary Castilla  . COLONOSCOPY WITH PROPOFOL N/A 10/03/2017   Procedure: COLONOSCOPY WITH PROPOFOL;  Surgeon: Robert Bellow, MD;  Location: [facility] ENDOSCOPY;  Service:  Endoscopy;  Laterality: N/A;  . DERMATOFIBROMA  03/26/15   Procedure: Excision mass left leg;  Surgeon: Robert Bellow, MD;  Location: [facility] ORS;  Service: General;  Laterality: N/A;  . LIPOMA EXCISION N/A 03/26/2015      . TONSILLECTOMY         Home Medications    Prior to Admission medications   Medication Sig Start Date End Date Taking? Authorizing Provider  amLODipine (NORVASC) 5 MG tablet Take 5 mg by mouth daily. 08/12/19  Yes [provider]  aspirin EC 81 MG tablet Take 81 mg by mouth daily.   Yes [provider]  Continuous Blood Gluc Sensor (FREESTYLE LIBRE 14 DAY SENSOR) MISC SMARTSIG:1 Each Topical Every 2 Weeks 10/31/19  Yes [provider]  fenofibrate (TRICOR) 145 MG tablet Take 145 mg by mouth daily. 09/19/17  Yes [provider]  hydrochlorothiazide (HYDRODIURIL) 50 MG tablet Take 1 tablet (50 mg total) by mouth daily. 08/21/17  Yes Pleas Koch, NP  Insulin Degludec (TRESIBA FLEXTOUCH) 200 UNIT/ML SOPN Inject 70 Units into the skin daily.    Yes [provider]  levothyroxine (SYNTHROID) 50 MCG tablet TAKE 1 TABLET BY MOUTH ONCE DAILY ON AN EMPTY STOMACH WITH A GLASS OF WATER AT LEAST 30 TO 60 MINUTES BEFORE BREAKFAST. 02/10/20  Yes Pleas Koch, NP  metFORMIN (GLUCOPHAGE) 1000 MG tablet TAKE 1 TABLET BY MOUTH TWICE A DAY 03/13/16  Yes Chauvin, Robert, PA  NOVOLOG FLEXPEN 100 UNIT/ML FlexPen Inject 28 units before breakfast, inject 24 units beforelunch, inject  34 units before supper. Increase when blood sugar is over 150. Use 2 units more for every 50 over. 04/04/17  Yes [provider]  ramipril (ALTACE) 10 MG capsule TAKE 1 CAPSULE BY MOUTH 2 TIMES DAILY 11/06/17  Yes Pleas Koch, NP  TRUEPLUS 5-BEVEL PEN NEEDLES 31G X 6 MM MISC  10/20/19  Yes [provider]  albuterol (PROVENTIL HFA;VENTOLIN HFA) 108 (90 Base) MCG/ACT inhaler Inhale 2 puffs into the lungs every 4 (four) hours as needed for wheezing or  shortness of breath. 07/25/18   Laverle Hobby, MD  amoxicillin (AMOXIL) 875 MG tablet Take 1 tablet (875 mg total) by mouth 2 (two) times daily for 7 days. 02/16/20 02/23/20  Sharion Balloon, NP    Family History Family History  Problem Relation Age of Onset  . Stroke Mother   . Diabetes Mother   . Stroke Father   . Diabetes Other   . Other Daughter 25       precancerous colon polyp   . Diabetes Maternal Grandmother   . Diabetes Paternal Grandmother     Social History Social History   Tobacco Use  . Smoking status: Former Smoker    Packs/day: 2.00    Years: 35.00    Pack years: 70.00    Types: Cigarettes, E-cigarettes    Quit date: 12/11/2014    Years since quitting: 5.1  . Smokeless tobacco: Never Used  Vaping Use  . Vaping Use: Never used  Substance Use Topics  . Alcohol use: No    Alcohol/week: 0.0 standard drinks  . Drug use: No     Allergies   Atorvastatin and Other   Review of Systems Review of Systems  Constitutional: Negative for chills and fever.  HENT: Positive for congestion, postnasal drip and sinus pressure. Negative for ear pain and sore throat.   Eyes: Negative for pain and visual disturbance.  Respiratory: Positive for cough. Negative for shortness of breath.   Cardiovascular: Negative for chest pain and palpitations.  Gastrointestinal: Negative for abdominal pain and vomiting.  Genitourinary: Negative for dysuria and hematuria.  Musculoskeletal: Negative for arthralgias and back pain.  Skin: Negative for color change and rash.  Neurological: Negative for seizures and syncope.  All other systems reviewed and are negative.    Physical Exam Triage Vital Signs ED Triage Vitals  Enc Vitals Group     BP 02/16/20 1040 (!) 157/67     Pulse Rate 02/16/20 1040 88     Resp 02/16/20 1040 16     Temp 02/16/20 1040 (!) 97.5 F (36.4 C)     Temp Source 02/16/20 1040 Temporal     SpO2 02/16/20 1040 95 %     Weight --      Height --      Head  Circumference --      Peak Flow --      Pain Score 02/16/20 1042 0     Pain Loc --      Pain Edu? --      Excl. in [place]? --    No data found.  Updated Vital Signs BP (!) 157/67 (BP Location: Left Arm)   Pulse 88   Temp (!) 97.5 F (36.4 C) (Temporal)   Resp 16   SpO2 95%   Visual Acuity Right Eye Distance:   Left Eye Distance:   Bilateral Distance:    Right Eye Near:   Left Eye Near:    Bilateral Near:     Physical  Exam Vitals and nursing note reviewed.  Constitutional:      Appearance: He is well-developed.  HENT:     Head: Normocephalic and atraumatic.     Right Ear: Tympanic membrane is erythematous.     Left Ear: Tympanic membrane is erythematous.     Nose: Congestion present.     Mouth/Throat:     Mouth: Mucous membranes are moist.     Pharynx: Posterior oropharyngeal erythema present. No oropharyngeal exudate.  Eyes:     Conjunctiva/sclera: Conjunctivae normal.  Cardiovascular:     Rate and Rhythm: Normal rate and regular rhythm.     Heart sounds: No murmur heard.   Pulmonary:     Effort: Pulmonary effort is normal. No respiratory distress.     Breath sounds: Normal breath sounds.  Abdominal:     Palpations: Abdomen is soft.     Tenderness: There is no abdominal tenderness. There is no guarding or rebound.  Musculoskeletal:     Cervical back: Neck supple.  Skin:    General: Skin is warm and dry.     Findings: No rash.  Neurological:     Mental Status: He is alert and oriented to person, place, and time. Mental status is at baseline.  Psychiatric:        Mood and Affect: Mood normal.        Behavior: Behavior normal.      UC Treatments / Results  Labs (all labs ordered are listed, but only abnormal results are displayed) Labs Reviewed  NOVEL CORONAVIRUS, NAA    EKG   Radiology No results found.  Procedures Procedures (including critical care time)  Medications Ordered in UC Medications - No data to display  Initial Impression /  Assessment and Plan / UC Course  I have reviewed the triage vital signs and the nursing notes.  Pertinent labs & imaging results that were available during my care of the patient were reviewed by me and considered in my medical decision making (see chart for details).   Acute sinusitis.  Treating with amoxicillin.  PCR COVID pending.  Instructed patient to self quarantine until the test result is back.  Instructed him to follow-up with his PCP if his symptoms are not improving.  Patient agrees to plan of care.     Final Clinical Impressions(s) / UC Diagnoses   Final diagnoses:  Acute non-recurrent maxillary sinusitis     Discharge Instructions     Take the amoxicillin as directed.    Follow up with your primary care provider if your symptoms are not improving.       ED Prescriptions    Medication Sig Dispense Auth. Provider   amoxicillin (AMOXIL) 875 MG tablet Take 1 tablet (875 mg total) by mouth 2 (two) times daily for 7 days. 14 tablet Sharion Balloon, NP     PDMP not reviewed this encounter.   Sharion Balloon, NP 02/16/20 1105

## 2020-02-17 DIAGNOSIS — E1142 Type 2 diabetes mellitus with diabetic polyneuropathy: Secondary | ICD-10-CM | POA: Diagnosis not present

## 2020-02-17 LAB — NOVEL CORONAVIRUS, NAA: SARS-CoV-2, NAA: NOT DETECTED

## 2020-02-17 LAB — SARS-COV-2, NAA 2 DAY TAT

## 2020-02-24 ENCOUNTER — Ambulatory Visit: Payer: HMO | Admitting: Podiatry

## 2020-02-24 ENCOUNTER — Other Ambulatory Visit: Payer: Self-pay

## 2020-02-24 ENCOUNTER — Encounter: Payer: Self-pay | Admitting: Podiatry

## 2020-02-24 DIAGNOSIS — E119 Type 2 diabetes mellitus without complications: Secondary | ICD-10-CM | POA: Diagnosis not present

## 2020-02-24 DIAGNOSIS — L84 Corns and callosities: Secondary | ICD-10-CM | POA: Diagnosis not present

## 2020-02-24 DIAGNOSIS — M79675 Pain in left toe(s): Secondary | ICD-10-CM

## 2020-02-24 DIAGNOSIS — M79674 Pain in right toe(s): Secondary | ICD-10-CM | POA: Diagnosis not present

## 2020-02-24 DIAGNOSIS — B351 Tinea unguium: Secondary | ICD-10-CM | POA: Diagnosis not present

## 2020-02-24 DIAGNOSIS — Z794 Long term (current) use of insulin: Secondary | ICD-10-CM

## 2020-02-25 NOTE — Progress Notes (Signed)
Subjective: [name] presents today preventative diabetic foot care and callus(es) right foot and painful mycotic toenails b/l that are difficult to trim. Pain interferes with ambulation. Aggravating factors include wearing enclosed shoe gear. Pain is relieved with periodic professional debridement.   He voices no new pedal problems on today's visit.  Juan Koch, NP is patient's PCP.   Past Medical History:  Diagnosis Date  . Arthritis    knee  . Colon polyp 2008  . COPD with emphysema ([place])   . Diabetes mellitus   . Hypercholesteremia   . Hypertension   . Hypothyroidism   . Observed sleep apnea      Current Outpatient Medications on File Prior to Visit  Medication Sig Dispense Refill  . albuterol (PROVENTIL HFA;VENTOLIN HFA) 108 (90 Base) MCG/ACT inhaler Inhale 2 puffs into the lungs every 4 (four) hours as needed for wheezing or shortness of breath. 1 Inhaler 0  . amLODipine (NORVASC) 5 MG tablet Take 5 mg by mouth daily.    Marland Kitchen aspirin EC 81 MG tablet Take 81 mg by mouth daily.    . Continuous Blood Gluc Sensor (FREESTYLE LIBRE 14 DAY SENSOR) MISC SMARTSIG:1 Each Topical Every 2 Weeks    . fenofibrate (TRICOR) 145 MG tablet Take 145 mg by mouth daily.    . hydrochlorothiazide (HYDRODIURIL) 50 MG tablet Take 1 tablet (50 mg total) by mouth daily. 90 tablet 1  . Insulin Degludec (TRESIBA FLEXTOUCH) 200 UNIT/ML SOPN Inject 70 Units into the skin daily.     Marland Kitchen levothyroxine (SYNTHROID) 50 MCG tablet TAKE 1 TABLET BY MOUTH ONCE DAILY ON AN EMPTY STOMACH WITH A GLASS OF WATER AT LEAST 30 TO 60 MINUTES BEFORE BREAKFAST. 90 tablet 0  . metFORMIN (GLUCOPHAGE) 1000 MG tablet TAKE 1 TABLET BY MOUTH TWICE A DAY 180 tablet 1  . NOVOLOG FLEXPEN 100 UNIT/ML FlexPen Inject 28 units before breakfast, inject 24 units beforelunch, inject 34 units before supper. Increase when blood sugar is over 150. Use 2 units more for every 50 over.  9  . ramipril (ALTACE) 10 MG capsule TAKE 1 CAPSULE BY  MOUTH 2 TIMES DAILY 180 capsule 1  . TRUEPLUS 5-BEVEL PEN NEEDLES 31G X 6 MM MISC      No current facility-administered medications on file prior to visit.     Allergies  Allergen Reactions  . Atorvastatin     Other reaction(s): Headache  . Other Other (See Comments)    STATIN DRUG-CAUSED MUSCLE PAIN/LETHARGIC    Objective: [name] is a pleasant 69 y.o. Caucasian male, WD, WN in NAD. AAO x 3.   There were no vitals filed for this visit.  Vascular Examination: Neurovascular status unchanged b/l. Capillary fill time to digits <3 seconds b/l. Palpable PT pulses b/l. Faintly palpable DP pulses b/l. Pedal hair sparse b/l. Skin temperature gradient within normal limits b/l. No edema noted b/l.  Dermatological Examination: Pedal skin with normal turgor, texture and tone bilaterally. No open wounds bilaterally. No interdigital macerations bilaterally. Toenails 1-5 b/l elongated, dystrophic, thickened, crumbly with subungual debris and tenderness to dorsal palpation. Hyperkeratotic lesion(s) submet head 3 right foot and submet head 5 right foot.  No erythema, no edema, no drainage, no flocculence.  Musculoskeletal: Normal muscle strength 5/5 to all lower extremity muscle groups bilaterally. No pain crepitus or joint limitation noted with ROM b/l. Hammertoes noted to the 1-5 bilaterally.  Neurological Examination: Protective sensation intact 5/5 intact bilaterally with 10g monofilament b/l. Vibratory sensation intact  b/l. Proprioception intact bilaterally.  Assessment: 1. Pain due to onychomycosis of toenails of both feet   2. Callus   3. Type 2 diabetes mellitus without complication, with long-term current use of insulin ([place])    Plan: -Examined patient. -No new findings. No new orders. -Continue diabetic foot care principles. Literature dispensed on today.  -Toenails 1-5 b/l were debrided in length and girth with sterile nail nippers and dremel without iatrogenic bleeding.   -Callus(es) submet head 3 right foot and submet head 5 right foot pared utilizing sterile scalpel blade without complication or incident. Total number debrided =2. -Patient to continue soft, supportive shoe gear daily. -Patient to report any pedal injuries to medical professional immediately. -Patient/POA to call should there be question/concern in the interim.  Return in about 10 weeks (around 05/04/2020) for diabetic nail and callus trim.  Marzetta Board, DPM

## 2020-03-03 ENCOUNTER — Telehealth: Payer: HMO

## 2020-03-09 NOTE — Chronic Care Management (AMB) (Signed)
Chronic Care Management Pharmacy  Name: [name]  MRN: [id number] DOB: 03/06/1951  Chief Complaint/ HPI  [name],  69 y.o. , male presents for their Initial CCM visit with the clinical pharmacist via telephone due to Barton-19 Pandemic.  PCP : Juan Koch, NP  Their chronic conditions include: hypertension, allergic rhinitis, COPD, chronic constipation, GERD, hypothyroidism, diabetes mellitus, osteoarthritis, hypercholestrolemia,   Office Visits: 01/08/2020 - nevus change in appearance. Removed and sent to pathology.  11/12/2019 - [name] for knee pain - bilateral knee steroid injection. Working to get approval for visco injections ([name] cost too high - ~$1200).  10/07/2019 - [name] Barton vaccine.  Consult Visit: 02/24/2020 - Podiatry - pain due to onychomycosis of toenails/diabetes foot care.  02/17/2020 - Endocrinology [name] - Reassured him that diabetes is well controlled. Continue current regimen for diabetes including [name] (70 units qhs) and [name] (28 units before breakfast, 24 units before lunch, and 34 units before supper) as prescribed. Discussed inaccuracy of his [name]. Will change to [name]. A receiver, transmitter and sensors were sent to his local pharmayc, where has has previously been able to get all diabetes supplies including the [name], filled without cost. Continue ACE-I for renal protection. Lipids controlled. Continue fibrate.Not on statin due to h/o statin-induced myalgias.  02/16/2020 - Urgent Care - Acute sinusitis.  Treating with amoxicillin.  [name] pending.  Instructed patient to self quarantine until the test result is back.  Instructed him to follow-up with his PCP if his symptoms are not improving.  Patient agrees to plan of care.   02/16/2020 - Endocrinology [name] - televisit scheduled due to possible Barton. BS range at home 50-210.  12/03/2019 - Podiatry - diabetes foot care. Pain due to onychomycosis of toenails of  both feet.  11/10/2019 - [name] - Target a1c is 7.5%. Continue taking [place] and [name] as prescribed. Not on statin due to statin-induced myalgias.  Medications: Outpatient Encounter Medications as of 03/12/2020  Medication Sig  . albuterol (PROVENTIL HFA;VENTOLIN HFA) 108 (90 Base) MCG/ACT inhaler Inhale 2 puffs into the lungs every 4 (four) hours as needed for wheezing or shortness of breath.  Marland Kitchen amLODipine (NORVASC) 5 MG tablet Take 5 mg by mouth daily.  Marland Kitchen aspirin EC 81 MG tablet Take 81 mg by mouth daily.  . cetirizine (ZYRTEC) 10 MG tablet Take 10 mg by mouth daily.  . Continuous Blood Gluc Sensor ([name] 14 DAY SENSOR) MISC SMARTSIG:1 Each Topical Every 2 Weeks  . fenofibrate (TRICOR) 145 MG tablet Take 145 mg by mouth daily.  . hydrochlorothiazide (HYDRODIURIL) 50 MG tablet Take 1 tablet (50 mg total) by mouth daily.  . Insulin Degludec (Juan FLEXTOUCH) 200 UNIT/ML SOPN Inject 70 Units into the skin daily.   Marland Kitchen levothyroxine (SYNTHROID) 50 MCG tablet TAKE 1 TABLET BY MOUTH ONCE DAILY ON AN EMPTY STOMACH WITH A GLASS OF WATER AT LEAST 30 TO 60 MINUTES BEFORE BREAKFAST.  . metFORMIN (GLUCOPHAGE) 1000 MG tablet TAKE 1 TABLET BY MOUTH TWICE A DAY  . [name] FLEXPEN 100 UNIT/ML FlexPen Inject 28 units before breakfast, inject 24 units beforelunch, inject 34 units before supper. Increase when blood sugar is over 150. Use 2 units more for every 50 over.  . ramipril (ALTACE) 10 MG capsule TAKE 1 CAPSULE BY MOUTH 2 TIMES DAILY  . TRUEPLUS 5-BEVEL PEN NEEDLES 31G X 6 MM MISC    No facility-administered encounter medications on file as of 03/12/2020.   Allergies  Allergen Reactions  . Atorvastatin     Other reaction(s): Headache  . Other Other (See Comments)    STATIN DRUG-CAUSED MUSCLE PAIN/LETHARGIC   SDOH Screenings   Alcohol Screen:   . Last Alcohol Screening Score (AUDIT): Not on file  Depression (PHQ2-9): Low Risk   . PHQ-2 Score: 0  Financial Resource Strain:   .  Difficulty of Paying Living Expenses: Not on file  Food Insecurity:   . Worried About Charity fundraiser in the Last Year: Not on file  . Ran Out of Food in the Last Year: Not on file  Housing:   . Last Housing Risk Score: Not on file  Physical Activity:   . Days of Exercise per Week: Not on file  . Minutes of Exercise per Session: Not on file  Social Connections:   . Frequency of Communication with Friends and Family: Not on file  . Frequency of Social Gatherings with Friends and Family: Not on file  . Attends Religious Services: Not on file  . Active Member of Clubs or Organizations: Not on file  . Attends Archivist Meetings: Not on file  . Marital Status: Not on file  Stress:   . Feeling of Stress : Not on file  Tobacco Use: Medium Risk  . Smoking Tobacco Use: Former Smoker  . Smokeless Tobacco Use: Never Used  Transportation Needs:   . Film/video editor (Medical): Not on file  . Lack of Transportation (Non-Medical): Not on file   Current Diagnosis/Assessment:  Goals Addressed            This Visit's Progress   . Pharmacy Care Plan       CARE PLAN ENTRY (see longitudinal plan of care for additional care plan information)  Current Barriers:  . Chronic Disease Management support, education, and care coordination needs related to Hypertension, Hyperlipidemia, and Diabetes   Hypertension BP Readings from Last 3 Encounters:  02/16/20 (!) 157/67  01/08/20 124/70  11/12/19 140/70   . Pharmacist Clinical Goal(s): o Over the next 90 days, patient will work with PharmD and providers to maintain BP goal <130/80 . Current regimen:   Amlodipine 5 mg daily   Hydrochlorothiazide 50 mg daily  Ramipril 10 mg bid . Interventions: o Patient has a wrist cuff at home and checks daily.  . Patient self care activities - Over the next 90 days, patient will: o Check BP daily, document, and provide at future appointments o Ensure daily salt intake < 2300  mg/day  Hyperlipidemia Lab Results  Component Value Date/Time   LDLCALC 97 06/01/2016 07:50 AM   LDLCALC 82 02/19/2015 09:23 AM   . Pharmacist Clinical Goal(s): o Over the next 90 days, patient will work with PharmD and providers to achieve LDL goal < 70 mg/dL  . Current regimen:  . Aspirin ec 81 mg daily . Fenofibrate 145 mg daily . Interventions: o Patient was unable to tolerate pravastatin in the past. He is willing to try to a low dose of rosuvastatin daily if approved.  . Patient self care activities - Over the next 90 days, patient will: o Begin taking rosuvastatin daily and report any adverse effects to pharmacist.   Diabetes Lab Results  Component Value Date/Time   HGBA1C 7.5 (H) 06/01/2016 07:50 AM   HGBA1C 8.7 11/26/2015 08:55 AM   HGBA1C 8.0 08/27/2015 09:58 AM   . Pharmacist Clinical Goal(s): o Over the next 90 days, patient will work with PharmD and providers to achieve  A1c goal 7.5% . Current regimen:   Juan 200 unit/ml 70 units into the skin daily  Metformin 1000 mg bid  [name] 100 unit/ml 28 units before breakfast/24 units before lunch/34 units before supper. Increase when blood sugar is over 150. Use 2 units more for every 50 over.   [name] CGM  Trueplus pen needles . Interventions: o Endocrinology's goal for a1c is 7.5% due to history of low blood sugar.  o Discussed patient contacting Dr. Gabriel Carina to work on [employer] approval/cost determination. . Patient self care activities - Over the next 90 days, patient will: o Check blood sugar 3-4 times daily, document, and provide at future appointments o Contact provider with any episodes of hypoglycemia  Medication management . Pharmacist Clinical Goal(s): o Over the next 90 days, patient will work with PharmD and providers to maintain optimal medication adherence . Current pharmacy: [facility] . Interventions o Comprehensive medication review performed. o Continue current medication  management strategy . Patient self care activities - Over the next 90 days, patient will: o Focus on medication adherence by continuing to use pill box o Take medications as prescribed o Report any questions or concerns to PharmD and/or provider(s)  Initial goal documentation        COPD / Asthma / Tobacco   Eosinophil count:  No results found for: EOSPCT%                               Eos (Absolute):  Lab Results  Component Value Date/Time   EOSABS 0.2 02/19/2015 09:23 AM    Tobacco Status:  Social History   Tobacco Use  Smoking Status Former Smoker  . Packs/day: 2.00  . Years: 35.00  . Pack years: 70.00  . Types: Cigarettes, E-cigarettes  . Quit date: 12/11/2014  . Years since quitting: 5.2  Smokeless Tobacco Never Used    Patient has failed these meds in past: flonase, cetirizine, montelukast,  Patient is currently controlled on the following medications:   Albuterol inhaler 2 puffs every 4 hours prn wheezing or shortness of breath  Cetirizine OTC 10 mg daily Using maintenance inhaler regularly? No Frequency of rescue inhaler use:  infrequently  We discussed:  proper inhaler technique. Patient has to use albuterol 1-2 times each year. Taking a flu shot has reduced his flares with difficulty breathing. Patient quit smoking about 5 years ago.  Plan  Continue current medications ,  Diabetes   Recent Relevant Labs:   7.3% per Dr. Joycie Peek August 2021 note   Lab Results  Component Value Date/Time   HGBA1C 7.5 (H) 06/01/2016 07:50 AM   HGBA1C 8.7 11/26/2015 08:55 AM   HGBA1C 8.0 08/27/2015 09:58 AM   MICROALBUR 1.1 06/01/2016 07:50 AM     Checking BG: Before meals  Recent FBG Readings: 145, 175, 107, 113, 91,  Recent pre-meal BG readings: 91, 146, 157, 111, 134, 164  114, 127, 87, 76, 76,   Recent HS BG readings: 206, 136, 54 (Barton reading and 134 actual test strips/fingerstick)  Patient has failed these meds in past: Bydureon, glipizide, humalog  kwikpen, lantus, Januvia Patient is currently uncontrolled on the following medications:   Juan 200 unit/ml 70 units into the skin daily  Metformin 1000 mg bid  [name] 100 unit/ml 28 units before breakfast/24 units before lunch/34 units before supper. Increase when blood sugar is over 150. Use 2 units more for every 50 over.   Juan [employer]  CGM  Trueplus pen needles  Last diabetic Foot exam:  Lab Results  Component Value Date/Time   HMDIABEYEEXA No Retinopathy 11/27/2019 12:00 AM    Last diabetic Eye exam: No results found for: HMDIABFOOTEX   We discussed: diet and exercise extensively. Patient has a correction factor with [name]. Has had some issues with Barton registering low blood sugar readings and Endocrinology had recommended Juan. Discussed options to determine Juan coverage/approval. Patient is applying sensor to his chest or side which is not an approved site. Discussed placing on the inside of the arm. He uses a clear film bandage over the sensor to help it stay in place. He had a reading that was 54 on Barton and 134 with test strips/fingerprick. Patient calling Endocrinology with [employer]N information to determine benefits for Juan.   Exercise - patient denies any exercise due to "bad knees". Working with a massage therapist for knee pain/back pain. Chiropractor states that his back is tensing up due to compensating for knee pain. Uses pedal exerciser for 10 minutes 3 times each week. Discussed benefits of increasing to goal of 150 minutes each week. Patient states he is unable at this time due to knees.   Diet - Patient mostly eats a lot of chicken, lean red meat, vegetables. He indicates that he eats very little bread. Eats a salad daily. When he eats out at [employer] and chips seem to drive up his blood sugar.   Hypoglycemia - Has had low blood sugar of 40s. over night in the past. Woke up sweating. Ate a peanut butter and jelly sandwich.    Plan  Continue current medications and try Juan per Endocrinology request  Hyperlipidemia   LDL goal < 70 mg/dL  Lipid Panel from Dr. Gabriel Carina Component Value Date  CHOLTOTAL 143 02/10/2020  LDLCALC 80 02/10/2020  HDL 27.1 (L) 02/10/2020  TRIG 182 02/10/2020    Lipid Panel     Component Value Date/Time   CHOL 152 06/01/2016 0750   CHOL 151 02/19/2015 0923   TRIG 106.0 06/01/2016 0750   HDL 33.70 (L) 06/01/2016 0750   HDL 42 02/19/2015 0923   LDLCALC 97 06/01/2016 0750   LDLCALC 82 02/19/2015 0923    Hepatic Function Latest Ref Rng & Units 06/01/2016 02/19/2015 12/18/2014  Total Protein 6.0 - 8.3 g/dL 7.1 6.9 6.9  Albumin 3.5 - 5.2 g/dL 4.5 4.7 4.7  AST 0 - 37 U/L 33 22 19  ALT 0 - 53 U/L 43 41 27  Alk Phosphatase 39 - 117 U/L 46 62 53  Total Bilirubin 0.2 - 1.2 mg/dL 0.3 0.3 0.4     The ASCVD Risk score (Gravette., et al., 2013) failed to calculate for the following reasons:   The patient has a prior MI or stroke diagnosis   Patient has failed these meds in past: pravastatin Patient is currently uncontrolled on the following medications:  . Aspirin ec 81 mg daily . Fenofibrate 145 mg daily  We discussed:  diet and exercise extensively. Patient has been intolerant to statins. Patient was unable to function on pravastatin due to joint pain, aches and no energy. Patient would consider a low dose of Crestor each day if [place] approves.   Plan  Continue current medications and control with diet and exercise. Recommend beginning Crestor 5 mg daily if approved.    Hypertension   BP today is:  <130/80  Office blood pressures are  BP Readings from Last 3 Encounters:  02/16/20 (!) 157/67  01/08/20 124/70  11/12/19 140/70    Patient has failed these meds in the past: none reported.  Patient is currently controlled on the following medications:   Amlodipine 5 mg daily   Hydrochlorothiazide 50 mg daily  Ramipril 10 mg bid  Patient checks BP at home  daily  Patient home BP readings are ranging: 130-135/75 mmHg   We discussed diet and exercise extensively  Plan  Continue current medications   Hypothyroidism   Lab Results  Component Value Date/Time   TSH 4.49 04/15/2019 09:15 AM   TSH 4.07 06/20/2018 08:06 AM   FREET4 0.89 07/04/2016 08:44 AM    Patient has failed these meds in past: n/a Patient is currently controlled on the following medications:  . Levothyroxine 50 mcg daily on an empty stomach  We discussed:  Patient denies any problems or symptoms.   Plan  Continue current medications   Vaccines   Reviewed and discussed patient's vaccination history.    Immunization History  Administered Date(s) Administered  . Fluad Quad(high Dose 65+) 04/15/2019  . Influenza Split 04/26/2007  . Influenza,inj,Quad PF,6+ Mos 03/18/2015, 05/10/2017, 04/08/2018  . Influenza-Unspecified 03/18/2015, 04/16/2016, 05/10/2017  . [name] SARS-COV-2 Vaccination 09/16/2019, 10/07/2019  . Pneumococcal Conjugate-13 09/25/2017  . Pneumococcal Polysaccharide-23 06/19/2011, 06/05/2016  . Tdap 06/19/2011, 04/01/2015  . Zoster Recombinat (Shingrix) 04/15/2019, 06/17/2019    Plan  Recommended patient receive annual flu vaccine in office.   Medication Management   Pt uses [facility] for all medications Uses pill box? Yes Pt endorses excellent compliance  We discussed: Uses a pill tray to keep medications organized. Denies missed doses.   Plan  Continue current medication management strategy    Follow up: 4 month phone visit - discuss tolerability of cholesterol medication and updated blood work results after

## 2020-03-12 ENCOUNTER — Other Ambulatory Visit: Payer: Self-pay

## 2020-03-12 ENCOUNTER — Ambulatory Visit: Payer: HMO

## 2020-03-12 ENCOUNTER — Telehealth: Payer: Self-pay

## 2020-03-12 DIAGNOSIS — E119 Type 2 diabetes mellitus without complications: Secondary | ICD-10-CM

## 2020-03-12 DIAGNOSIS — Z794 Long term (current) use of insulin: Secondary | ICD-10-CM

## 2020-03-12 DIAGNOSIS — I10 Essential (primary) hypertension: Secondary | ICD-10-CM

## 2020-03-12 DIAGNOSIS — E7800 Pure hypercholesterolemia, unspecified: Secondary | ICD-10-CM

## 2020-03-12 NOTE — Progress Notes (Signed)
PCP consult regarding CCM visit 03/12/2020:   During our visit we discussed patient's cholesterol and the recommendations/goals of therapy for cholesterol in patients with Diabetes.  Patient has only tried and failed pravastatin in the past. We discussed the benefits of statin therapy. Patient is willing to consider rosuvastatin 5 mg daily if you approve.   He uses [facility] for all of his medication needs.   Feel free to let me know if you have any questions or concerns.   Thank you,  Sherre Poot, PharmD, [facility] Clinical Pharmacist [facility] [phone] (office) [phone] (mobile)

## 2020-03-12 NOTE — Patient Instructions (Addendum)
Visit Information  Thank you for your time discussing your medications. I look forward to working with you to achieve your health care goals. Below is a summary of what we talked about during our visit.   Goals Addressed            This Visit's Progress   . Pharmacy Care Plan       CARE PLAN ENTRY (see longitudinal plan of care for additional care plan information)  Current Barriers:  . Chronic Disease Management support, education, and care coordination needs related to Hypertension, Hyperlipidemia, and Diabetes   Hypertension BP Readings from Last 3 Encounters:  02/16/20 (!) 157/67  01/08/20 124/70  11/12/19 140/70   . Pharmacist Clinical Goal(s): o Over the next 90 days, patient will work with PharmD and providers to maintain BP goal <130/80 . Current regimen:   Amlodipine 5 mg daily   Hydrochlorothiazide 50 mg daily  Ramipril 10 mg bid . Interventions: o Patient has a wrist cuff at home and checks daily.  . Patient self care activities - Over the next 90 days, patient will: o Check BP daily, document, and provide at future appointments o Ensure daily salt intake < 2300 mg/day  Hyperlipidemia Lab Results  Component Value Date/Time   LDLCALC 97 06/01/2016 07:50 AM   LDLCALC 82 02/19/2015 09:23 AM   . Pharmacist Clinical Goal(s): o Over the next 90 days, patient will work with PharmD and providers to achieve LDL goal < 70 mg/dL  . Current regimen:  . Aspirin ec 81 mg daily . Fenofibrate 145 mg daily . Interventions: o Patient was unable to tolerate pravastatin in the past. He is willing to try to a low dose of rosuvastatin daily if approved.  . Patient self care activities - Over the next 90 days, patient will: o Begin taking rosuvastatin daily and report any adverse effects to pharmacist.   Diabetes Lab Results  Component Value Date/Time   HGBA1C 7.5 (H) 06/01/2016 07:50 AM   HGBA1C 8.7 11/26/2015 08:55 AM   HGBA1C 8.0 08/27/2015 09:58 AM   . Pharmacist  Clinical Goal(s): o Over the next 90 days, patient will work with PharmD and providers to achieve A1c goal 7.5% . Current regimen:   Tresiba 200 unit/ml 70 units into the skin daily  Metformin 1000 mg bid  Novolog 100 unit/ml 28 units before breakfast/24 units before lunch/34 units before supper. Increase when blood sugar is over 150. Use 2 units more for every 50 over.   Freestyle Libre CGM  Trueplus pen needles . Interventions: o Endocrinology's goal for a1c is 7.5% due to history of low blood sugar.  o Discussed patient contacting Dr. Gabriel Carina to work on [employer] approval/cost determination. . Patient self care activities - Over the next 90 days, patient will: o Check blood sugar 3-4 times daily, document, and provide at future appointments o Contact provider with any episodes of hypoglycemia  Medication management . Pharmacist Clinical Goal(s): o Over the next 90 days, patient will work with PharmD and providers to maintain optimal medication adherence . Current pharmacy: [facility] . Interventions o Comprehensive medication review performed. o Continue current medication management strategy . Patient self care activities - Over the next 90 days, patient will: o Focus on medication adherence by continuing to use pill box o Take medications as prescribed o Report any questions or concerns to PharmD and/or provider(s)  Initial goal documentation        [name] was given information about Chronic Care  Management services today including:  1. CCM service includes personalized support from designated clinical staff supervised by his physician, including individualized plan of care and coordination with other care providers 2. 24/7 contact phone numbers for assistance for urgent and routine care needs. 3. Standard insurance, coinsurance, copays and deductibles apply for chronic care management only during months in which we provide at least 20 minutes of these services.  Most insurances cover these services at 100%, however patients may be responsible for any copay, coinsurance and/or deductible if applicable. This service may help you avoid the need for more expensive face-to-face services. 4. Only one practitioner may furnish and bill the service in a calendar month. 5. The patient may stop CCM services at any time (effective at the end of the month) by phone call to the office staff.  Patient agreed to services and verbal consent obtained.   The patient verbalized understanding of instructions provided today and agreed to receive a mailed copy of patient instruction and/or educational materials. Telephone follow up appointment with pharmacy team member scheduled for: 06/24/2020 at 1 pm   Sherre Poot, PharmD Clinical Pharmacist [facility] [phone] (office) [phone] (mobile)  Exercises To Do While Sitting  Exercises that you do while sitting (chair exercises) can give you many of the same benefits as full exercise. Benefits include strengthening your heart, burning calories, and keeping muscles and joints healthy. Exercise can also improve your mood and help with depression and anxiety. You may benefit from chair exercises if you are unable to do standing exercises because of:  Diabetic foot pain.  Obesity.  Illness.  Arthritis.  Recovery from surgery or injury.  Breathing problems.  Balance problems.  Another type of disability. Before starting chair exercises, check with your health care provider or a physical therapist to find out how much exercise you can tolerate and which exercises are safe for you. If your health care provider approves:  Start out slowly and build up over time. Aim to work up to about 10-20 minutes for each exercise session.  Make exercise part of your daily routine.  Drink water when you exercise. Do not wait until you are thirsty. Drink every 10-15 minutes.  Stop exercising right away if you  have pain, nausea, shortness of breath, or dizziness.  If you are exercising in a wheelchair, make sure to lock the wheels.  Ask your health care provider whether you can do tai chi or yoga. Many positions in these mind-body exercises can be modified to do while seated. Warm-up Before starting other exercises: 1. Sit up as straight as you can. Have your knees bent at 90 degrees, which is the shape of the capital letter "L." Keep your feet flat on the floor. 2. Sit at the front edge of your chair, if you can. 3. Pull in (tighten) the muscles in your abdomen and stretch your spine and neck as straight as you can. Hold this position for a few minutes. 4. Breathe in and out evenly. Try to concentrate on your breathing, and relax your mind. Stretching Exercise A: Arm stretch 1. Hold your arms out straight in front of your body. 2. Bend your hands at the wrist with your fingers pointing up, as if signaling someone to stop. Notice the slight tension in your forearms as you hold the position. 3. Keeping your arms out and your hands bent, rotate your hands outward as far as you can and hold this stretch. Aim to have your thumbs pointing  up and your pinkie fingers pointing down. Slowly repeat arm stretches for one minute as tolerated. Exercise B: Leg stretch 1. If you can move your legs, try to "draw" letters on the floor with the toes of your foot. Write your name with one foot. 2. Write your name with the toes of your other foot. Slowly repeat the movements for one minute as tolerated. Exercise C: Reach for the sky 1. Reach your hands as far over your head as you can to stretch your spine. 2. Move your hands and arms as if you are climbing a rope. Slowly repeat the movements for one minute as tolerated. Range of motion exercises Exercise A: Shoulder roll 1. Let your arms hang loosely at your sides. 2. Lift just your shoulders up toward your ears, then let them relax back down. 3. When your  shoulders feel loose, rotate your shoulders in backward and forward circles. Do shoulder rolls slowly for one minute as tolerated. Exercise B: March in place 1. As if you are marching, pump your arms and lift your legs up and down. Lift your knees as high as you can. ? If you are unable to lift your knees, just pump your arms and move your ankles and feet up and down. March in place for one minute as tolerated. Exercise C: Seated jumping jacks 1. Let your arms hang down straight. 2. Keeping your arms straight, lift them up over your head. Aim to point your fingers to the ceiling. 3. While you lift your arms, straighten your legs and slide your heels along the floor to your sides, as wide as you can. 4. As you bring your arms back down to your sides, slide your legs back together. ? If you are unable to use your legs, just move your arms. Slowly repeat seated jumping jacks for one minute as tolerated. Strengthening exercises Exercise A: Shoulder squeeze 1. Hold your arms straight out from your body to your sides, with your elbows bent and your fists pointed at the ceiling. 2. Keeping your arms in the bent position, move them forward so your elbows and forearms meet in front of your face. 3. Open your arms back out as wide as you can with your elbows still bent, until you feel your shoulder blades squeezing together. Hold for 5 seconds. Slowly repeat the movements forward and backward for one minute as tolerated. Contact a health care provider if you:  Had to stop exercising due to any of the following: ? Pain. ? Nausea. ? Shortness of breath. ? Dizziness. ? Fatigue.  Have significant pain or soreness after exercising. Get help right away if you have:  Chest pain.  Difficulty breathing. These symptoms may represent a serious problem that is an emergency. Do not wait to see if the symptoms will go away. Get medical help right away. Call your local emergency services (911 in the [place]  Do not drive yourself to the hospital. This information is not intended to replace advice given to you by your health care provider. Make sure you discuss any questions you have with your health care provider. Document Revised: 10/24/2018 Document Reviewed: 05/16/2017 Elsevier Patient Education  2020 [employer].

## 2020-03-13 DIAGNOSIS — G4733 Obstructive sleep apnea (adult) (pediatric): Secondary | ICD-10-CM | POA: Diagnosis not present

## 2020-03-13 DIAGNOSIS — R0902 Hypoxemia: Secondary | ICD-10-CM | POA: Diagnosis not present

## 2020-03-13 DIAGNOSIS — J449 Chronic obstructive pulmonary disease, unspecified: Secondary | ICD-10-CM | POA: Diagnosis not present

## 2020-03-15 ENCOUNTER — Telehealth: Payer: Self-pay

## 2020-03-15 MED ORDER — ROSUVASTATIN CALCIUM 5 MG PO TABS: 5.0000 mg | ORAL_TABLET | Freq: Every evening | ORAL | 3 refills | Status: DC

## 2020-03-15 NOTE — Addendum Note (Signed)
Addended by: Pleas Koch on: 03/15/2020 12:52 PM   Modules accepted: Orders

## 2020-03-15 NOTE — Telephone Encounter (Signed)
Approve the use of rosuvastatin and will send a prescription to [facility]. We need repeat lipids in 2 months, please set up a lab appointment.

## 2020-03-15 NOTE — Progress Notes (Signed)
Spoke to patient to inform him per clinical pharmacist Donette Larry , Clarise Cruz and his PCP agreed on patient starting Crestor 5 mg Daily. Prescription was sent over to his pharmacy in [place] .  Patient Verbalized understanding  Anderson Malta Clinical Pharmacist Assistant [phone]

## 2020-03-30 ENCOUNTER — Other Ambulatory Visit: Payer: Self-pay | Admitting: Primary Care

## 2020-04-12 ENCOUNTER — Other Ambulatory Visit: Payer: HMO

## 2020-04-13 DIAGNOSIS — G4733 Obstructive sleep apnea (adult) (pediatric): Secondary | ICD-10-CM | POA: Diagnosis not present

## 2020-04-13 DIAGNOSIS — R0902 Hypoxemia: Secondary | ICD-10-CM | POA: Diagnosis not present

## 2020-04-13 DIAGNOSIS — J449 Chronic obstructive pulmonary disease, unspecified: Secondary | ICD-10-CM | POA: Diagnosis not present

## 2020-04-19 ENCOUNTER — Encounter: Payer: HMO | Admitting: Primary Care

## 2020-04-26 ENCOUNTER — Ambulatory Visit (INDEPENDENT_AMBULATORY_CARE_PROVIDER_SITE_OTHER): Payer: HMO | Admitting: Primary Care

## 2020-04-26 ENCOUNTER — Other Ambulatory Visit: Payer: Self-pay

## 2020-04-26 VITALS — BP 136/78 | HR 82 | Temp 97.5°F | Ht 70.0 in | Wt 302.0 lb

## 2020-04-26 DIAGNOSIS — Z125 Encounter for screening for malignant neoplasm of prostate: Secondary | ICD-10-CM | POA: Diagnosis not present

## 2020-04-26 DIAGNOSIS — E038 Other specified hypothyroidism: Secondary | ICD-10-CM | POA: Diagnosis not present

## 2020-04-26 DIAGNOSIS — I10 Essential (primary) hypertension: Secondary | ICD-10-CM

## 2020-04-26 DIAGNOSIS — K219 Gastro-esophageal reflux disease without esophagitis: Secondary | ICD-10-CM

## 2020-04-26 DIAGNOSIS — J449 Chronic obstructive pulmonary disease, unspecified: Secondary | ICD-10-CM

## 2020-04-26 DIAGNOSIS — E119 Type 2 diabetes mellitus without complications: Secondary | ICD-10-CM

## 2020-04-26 DIAGNOSIS — Z86010 Personal history of colonic polyps: Secondary | ICD-10-CM

## 2020-04-26 DIAGNOSIS — Z87891 Personal history of nicotine dependence: Secondary | ICD-10-CM | POA: Diagnosis not present

## 2020-04-26 DIAGNOSIS — Z23 Encounter for immunization: Secondary | ICD-10-CM

## 2020-04-26 DIAGNOSIS — E039 Hypothyroidism, unspecified: Secondary | ICD-10-CM

## 2020-04-26 DIAGNOSIS — M170 Bilateral primary osteoarthritis of knee: Secondary | ICD-10-CM | POA: Diagnosis not present

## 2020-04-26 DIAGNOSIS — E7800 Pure hypercholesterolemia, unspecified: Secondary | ICD-10-CM

## 2020-04-26 DIAGNOSIS — K5909 Other constipation: Secondary | ICD-10-CM | POA: Diagnosis not present

## 2020-04-26 DIAGNOSIS — Z0000 Encounter for general adult medical examination without abnormal findings: Secondary | ICD-10-CM

## 2020-04-26 DIAGNOSIS — Z794 Long term (current) use of insulin: Secondary | ICD-10-CM

## 2020-04-26 LAB — TSH: TSH: 9.3 u[IU]/mL — ABNORMAL HIGH (ref 0.35–4.50)

## 2020-04-26 LAB — PSA, MEDICARE: PSA: 1.33 ng/ml (ref 0.10–4.00)

## 2020-04-26 MED ORDER — LEVOTHYROXINE SODIUM 50 MCG PO TABS: ORAL_TABLET | ORAL | 3 refills | Status: DC

## 2020-04-26 NOTE — Assessment & Plan Note (Signed)
Stable in the office today, continue ramipril, amlodipine, HCTZ. Continue same. CMP reviewed from Care Everywhere.

## 2020-04-26 NOTE — Assessment & Plan Note (Signed)
Follows with endocrinology through [facility].  A1C of 7.3 in July 2021, compliant to prescribed regimen. Continue same.

## 2020-04-26 NOTE — Patient Instructions (Addendum)
Stop by the lab prior to leaving today. I will notify you of your results once received.   Be sure to take your levothyroxine (thyroid medication) every morning on an empty stomach with water only. No food or other medications for 30 minutes. No heartburn medication, iron pills, calcium, vitamin D, or magnesium pills within four hours of taking levothyroxine.   Start exercising. You should be getting 150 minutes of exercise weekly.  It's important to improve your diet by reducing consumption of fast food, fried food, processed snack foods, sugary drinks. Increase consumption of fresh vegetables and fruits, whole grains, water.  Ensure you are drinking 64 ounces of water daily.  It was a pleasure to see you today!     Influenza (Flu) Vaccine (Inactivated or Recombinant): What You Need to Know 1. Why get vaccinated? Influenza vaccine can prevent influenza (flu). Flu is a contagious disease that spreads around the [place] every year, usually between October and May. Anyone can get the flu, but it is more dangerous for some people. Infants and young children, people 65 years of age and older, pregnant women, and people with certain health conditions or a weakened immune system are at greatest risk of flu complications. Pneumonia, bronchitis, sinus infections and ear infections are examples of flu-related complications. If you have a medical condition, such as heart disease, cancer or diabetes, flu can make it worse. Flu can cause fever and chills, sore throat, muscle aches, fatigue, cough, headache, and runny or stuffy nose. Some people may have vomiting and diarrhea, though this is more common in children than adults. Each year thousands of people in the [place] die from flu, and many more are hospitalized. Flu vaccine prevents millions of illnesses and flu-related visits to the doctor each year. 2. Influenza vaccine CDC recommends everyone 81 months of age and older get vaccinated  every flu season. Children 6 months through 59 years of age may need 2 doses during a single flu season. Everyone else needs only 1 dose each flu season. It takes about 2 weeks for protection to develop after vaccination. There are many flu viruses, and they are always changing. Each year a new flu vaccine is made to protect against three or four viruses that are likely to cause disease in the upcoming flu season. Even when the vaccine doesn't exactly match these viruses, it may still provide some protection. Influenza vaccine does not cause flu. Influenza vaccine may be given at the same time as other vaccines. 3. Talk with your health care provider Tell your vaccine provider if the person getting the vaccine:  Has had an allergic reaction after a previous dose of influenza vaccine, or has any severe, life-threatening allergies.  Has ever had Guillain-Barr Syndrome (also called GBS). In some cases, your health care provider may decide to postpone influenza vaccination to a future visit. People with minor illnesses, such as a cold, may be vaccinated. People who are moderately or severely ill should usually wait until they recover before getting influenza vaccine. Your health care provider can give you more information. 4. Risks of a vaccine reaction  Soreness, redness, and swelling where shot is given, fever, muscle aches, and headache can happen after influenza vaccine.  There may be a very small increased risk of Guillain-Barr Syndrome (GBS) after inactivated influenza vaccine (the flu shot). Young children who get the flu shot along with pneumococcal vaccine (PCV13), and/or DTaP vaccine at the same time might be slightly more likely to have a  seizure caused by fever. Tell your health care provider if a child who is getting flu vaccine has ever had a seizure. People sometimes faint after medical procedures, including vaccination. Tell your provider if you feel dizzy or have vision changes or  ringing in the ears. As with any medicine, there is a very remote chance of a vaccine causing a severe allergic reaction, other serious injury, or death. 5. What if there is a serious problem? An allergic reaction could occur after the vaccinated person leaves the clinic. If you see signs of a severe allergic reaction (hives, swelling of the face and throat, difficulty breathing, a fast heartbeat, dizziness, or weakness), call 9-1-1 and get the person to the nearest hospital. For other signs that concern you, call your health care provider. Adverse reactions should be reported to the Vaccine Adverse Event Reporting System (VAERS). Your health care provider will usually file this report, or you can do it yourself. Visit the VAERS website at www.vaers.SamedayNews.es or call (984) 468-2689.VAERS is only for reporting reactions, and VAERS staff do not give medical advice. 6. The National Vaccine Injury Compensation Program The [employer] Vaccine Injury Compensation Program (VICP) is a federal program that was created to compensate people who may have been injured by certain vaccines. Visit the VICP website at GoldCloset.com.ee or call (385) 455-4536 to learn about the program and about filing a claim. There is a time limit to file a claim for compensation. 7. How can I learn more?  Ask your healthcare provider.  Call your local or state health department.  Contact the Centers for Disease Control and Prevention (CDC): ? Call 407-158-4580 (1-800-CDC-INFO) or ? Visit CDC's https://gibson.com/ Vaccine Information Statement (Interim) Inactivated Influenza Vaccine (02/28/2018) This information is not intended to replace advice given to you by your health care provider. Make sure you discuss any questions you have with your health care provider. Document Revised: 10/22/2018 Document Reviewed: 03/04/2018 Elsevier Patient Education  [name].

## 2020-04-26 NOTE — Progress Notes (Signed)
Subjective:    Patient ID: [name], male    DOB: 18-Nov-1950, 69 y.o.   MRN: [id number]  HPI  This visit occurred during the SARS-CoV-2 public health emergency.  Safety protocols were in place, including screening questions prior to the visit, additional usage of staff PPE, and extensive cleaning of exam room while observing appropriate contact time as indicated for disinfecting solutions.   [name] is a 69 year old male who presents today for complete physical.  Immunizations: -Tetanus: Completed in 2016 -Influenza: Due today -Shingles: Completed Shingrix -Pneumonia: Completed series, last dose in 2019 -Covid-19: Completed series   Diet: He endorses a fair diet. Exercise: He is not exercising due to chronic knee pain  Eye exam: Completes annually  Dental exam: No recent exam  Colonoscopy: Completed in 2019, due in 2022 PSA: Completed in 0.88 Hep C Screen: Negative  Lung Cancer Screening:  Completed in 2020, due again this Winter  BP Readings from Last 3 Encounters:  04/26/20 136/78  02/16/20 (!) 157/67  01/08/20 124/70     Review of Systems  Constitutional: Negative for unexpected weight change.  HENT: Negative for rhinorrhea.   Respiratory: Negative for cough and shortness of breath.   Cardiovascular: Negative for chest pain.  Gastrointestinal: Negative for constipation and diarrhea.  Genitourinary: Negative for difficulty urinating.  Musculoskeletal: Positive for arthralgias. Negative for myalgias.       Chronic knee pain  Skin: Negative for rash.  Allergic/Immunologic: Positive for environmental allergies.  Neurological: Negative for dizziness and headaches.  Psychiatric/Behavioral: The patient is not nervous/anxious.        Past Medical History:  Diagnosis Date  . Arthritis    knee  . Colon polyp 2008  . COPD with emphysema ([place])   . Diabetes mellitus   . Hypercholesteremia   . Hypertension   . Hypothyroidism   . Observed sleep apnea   .  Statin myopathy      Social History   Socioeconomic History  . Marital status: Married    Spouse name: Not on file  . Number of children: Not on file  . Years of education: Not on file  . Highest education level: Not on file  Occupational History  . Not on file  Tobacco Use  . Smoking status: Former Smoker    Packs/day: 2.00    Years: 35.00    Pack years: 70.00    Types: Cigarettes, E-cigarettes    Quit date: 12/11/2014    Years since quitting: 5.3  . Smokeless tobacco: Never Used  Vaping Use  . Vaping Use: Never used  Substance and Sexual Activity  . Alcohol use: No    Alcohol/week: 0.0 standard drinks  . Drug use: No  . Sexual activity: Not on file  Other Topics Concern  . Not on file  Social History Narrative   Married.   3 children. 2 grandchildren.   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    Social Determinants of Health   Financial Resource Strain:   . Difficulty of Paying Living Expenses: Not on file  Food Insecurity: No Food Insecurity  . Worried About Charity fundraiser in the Last Year: Never true  . Ran Out of Food in the Last Year: Never true  Transportation Needs: No Transportation Needs  . Lack of Transportation (Medical): No  . Lack of Transportation (Non-Medical): No  Physical Activity: Insufficiently Active  . Days of  Exercise per Week: 3 days  . Minutes of Exercise per Session: 10 min  Stress:   . Feeling of Stress : Not on file  Social Connections:   . Frequency of Communication with Friends and Family: Not on file  . Frequency of Social Gatherings with Friends and Family: Not on file  . Attends Religious Services: Not on file  . Active Member of Clubs or Organizations: Not on file  . Attends Archivist Meetings: Not on file  . Marital Status: Not on file  Intimate Partner Violence:   . Fear of Current or Ex-Partner: Not on file  . Emotionally Abused: Not on file  . Physically  Abused: Not on file  . Sexually Abused: Not on file    Past Surgical History:  Procedure Laterality Date  . COLONOSCOPY  2008, 2016   Dr Bary Castilla  . COLONOSCOPY WITH PROPOFOL N/A 10/03/2017   Procedure: COLONOSCOPY WITH PROPOFOL;  Surgeon: Robert Bellow, MD;  Location: [facility] ENDOSCOPY;  Service: Endoscopy;  Laterality: N/A;  . DERMATOFIBROMA  03/26/15   Procedure: Excision mass left leg;  Surgeon: Robert Bellow, MD;  Location: [facility] ORS;  Service: General;  Laterality: N/A;  . LIPOMA EXCISION N/A 03/26/2015      . TONSILLECTOMY      Family History  Problem Relation Age of Onset  . Stroke Mother   . Diabetes Mother   . Stroke Father   . Diabetes Other   . Other Daughter 25       precancerous colon polyp   . Diabetes Maternal Grandmother   . Diabetes Paternal Grandmother     Allergies  Allergen Reactions  . Atorvastatin     Other reaction(s): Headache  . Other Other (See Comments)    STATIN DRUG-CAUSED MUSCLE PAIN/LETHARGIC    Current Outpatient Medications on File Prior to Visit  Medication Sig Dispense Refill  . albuterol (PROVENTIL HFA;VENTOLIN HFA) 108 (90 Base) MCG/ACT inhaler Inhale 2 puffs into the lungs every 4 (four) hours as needed for wheezing or shortness of breath. 1 Inhaler 0  . amLODipine (NORVASC) 5 MG tablet Take 5 mg by mouth daily.    Marland Kitchen aspirin EC 81 MG tablet Take 81 mg by mouth daily.    . Continuous Blood Gluc Sensor (FREESTYLE LIBRE 14 DAY SENSOR) MISC SMARTSIG:1 Each Topical Every 2 Weeks    . fenofibrate (TRICOR) 145 MG tablet Take 145 mg by mouth daily.    . hydrochlorothiazide (HYDRODIURIL) 50 MG tablet Take 1 tablet (50 mg total) by mouth daily. 90 tablet 1  . Insulin Degludec (TRESIBA FLEXTOUCH) 200 UNIT/ML SOPN Inject 70 Units into the skin daily.     . metFORMIN (GLUCOPHAGE) 1000 MG tablet TAKE 1 TABLET BY MOUTH TWICE A DAY 180 tablet 1  . NOVOLOG FLEXPEN 100 UNIT/ML FlexPen Inject 28 units before breakfast, inject 24 units beforelunch,  inject 34 units before supper. Increase when blood sugar is over 150. Use 2 units more for every 50 over.  9  . ramipril (ALTACE) 10 MG capsule TAKE 1 CAPSULE BY MOUTH 2 TIMES DAILY 180 capsule 1  . rosuvastatin (CRESTOR) 5 MG tablet Take 1 tablet (5 mg total) by mouth every evening. For cholesterol. 90 tablet 3  . TRUEPLUS 5-BEVEL PEN NEEDLES 31G X 6 MM MISC      No current facility-administered medications on file prior to visit.    BP 136/78   Pulse 82   Temp (!) 97.5 F (36.4 C) (Temporal)  Ht 5\' 10"  (1.778 m)   Wt (!) 302 lb (137 kg)   SpO2 94%   BMI 43.33 kg/m    Objective:   Physical Exam HENT:     Right Ear: Tympanic membrane and ear canal normal.     Left Ear: Tympanic membrane and ear canal normal.  Eyes:     Pupils: Pupils are equal, round, and reactive to light.  Cardiovascular:     Rate and Rhythm: Normal rate and regular rhythm.  Pulmonary:     Effort: Pulmonary effort is normal.     Breath sounds: Normal breath sounds.  Abdominal:     General: Bowel sounds are normal.     Palpations: Abdomen is soft.     Tenderness: There is no abdominal tenderness.  Musculoskeletal:     Cervical back: Neck supple.     Comments: Decrease in ROM to bilateral knees and lower extremities  Skin:    General: Skin is warm and dry.  Neurological:     Mental Status: He is alert and oriented to person, place, and time.     Cranial Nerves: No cranial nerve deficit.     Deep Tendon Reflexes:     Reflex Scores:      Patellar reflexes are 2+ on the right side and 2+ on the left side. Psychiatric:        Mood and Affect: Mood normal.            Assessment & Plan:

## 2020-04-26 NOTE — Assessment & Plan Note (Signed)
Repeat TSH level pending. He is taking levothyroxine in the very early morning hours, otherwise correctly.  Continue 50 mcg for now, await labs.

## 2020-04-26 NOTE — Assessment & Plan Note (Signed)
Immunizations UTD. Colonoscopy UTD, due in 2022. PSA due and pending. Discussed the importance of a healthy diet and regular exercise in order for weight loss, and to reduce the risk of any potential medical problems.  Exam today stable. Labs reviewed and also pending.

## 2020-04-26 NOTE — Assessment & Plan Note (Signed)
Denies concerns, eats plenty of vegetables which help.  Continue to monitor.

## 2020-04-26 NOTE — Assessment & Plan Note (Signed)
Compliant to fenofibrate and Crestor. Lipid panel reviewed in Care Everywhere from July 2021.  Continue current regimen.

## 2020-04-26 NOTE — Assessment & Plan Note (Signed)
No recent use of albuterol, lungs clear on exam. Compliant to lung cancer screening annually.

## 2020-04-26 NOTE — Assessment & Plan Note (Signed)
Due for repeat colonoscopy in 2022. 

## 2020-04-26 NOTE — Assessment & Plan Note (Signed)
Chronic, strongly encouraged weight loss.  Following with Sports medicine.

## 2020-04-26 NOTE — Assessment & Plan Note (Signed)
Denies concerns.

## 2020-04-26 NOTE — Assessment & Plan Note (Signed)
Compliant to annual lung cancer screening. Due again this December 2021.

## 2020-04-27 ENCOUNTER — Other Ambulatory Visit: Payer: Self-pay

## 2020-04-27 DIAGNOSIS — E038 Other specified hypothyroidism: Secondary | ICD-10-CM

## 2020-04-27 NOTE — Telephone Encounter (Signed)
See lab note for documentation.  

## 2020-05-11 ENCOUNTER — Other Ambulatory Visit: Payer: Self-pay | Admitting: *Deleted

## 2020-05-11 NOTE — Patient Outreach (Signed)
  [facility]) Care Management Chronic Special Needs Program    05/11/2020  Name: [name], DOB: September 03, 1950  MRN: [id number]   [name] is enrolled in a chronic special needs plan for Diabetes.  Member will be followed by the [employer] case management team beginning July 17, 2020.  Case is closed by [facility] care management, will reopen if needed before 07/17/2020.  Jacqlyn Larsen [facility], BSN [place], [place]

## 2020-05-13 DIAGNOSIS — G4733 Obstructive sleep apnea (adult) (pediatric): Secondary | ICD-10-CM | POA: Diagnosis not present

## 2020-05-13 DIAGNOSIS — J449 Chronic obstructive pulmonary disease, unspecified: Secondary | ICD-10-CM | POA: Diagnosis not present

## 2020-05-13 DIAGNOSIS — R0902 Hypoxemia: Secondary | ICD-10-CM | POA: Diagnosis not present

## 2020-05-25 ENCOUNTER — Other Ambulatory Visit (INDEPENDENT_AMBULATORY_CARE_PROVIDER_SITE_OTHER): Payer: HMO

## 2020-05-25 ENCOUNTER — Other Ambulatory Visit: Payer: Self-pay

## 2020-05-25 DIAGNOSIS — E038 Other specified hypothyroidism: Secondary | ICD-10-CM

## 2020-05-25 LAB — TSH: TSH: 5.44 u[IU]/mL — ABNORMAL HIGH (ref 0.35–4.50)

## 2020-05-26 ENCOUNTER — Other Ambulatory Visit: Payer: Self-pay | Admitting: Primary Care

## 2020-05-26 DIAGNOSIS — E038 Other specified hypothyroidism: Secondary | ICD-10-CM

## 2020-05-26 MED ORDER — LEVOTHYROXINE SODIUM 75 MCG PO TABS: ORAL_TABLET | ORAL | 0 refills | Status: DC

## 2020-05-27 NOTE — Telephone Encounter (Signed)
See result note for information

## 2020-06-01 ENCOUNTER — Ambulatory Visit: Payer: HMO | Admitting: Podiatry

## 2020-06-01 ENCOUNTER — Encounter: Payer: Self-pay | Admitting: Podiatry

## 2020-06-01 ENCOUNTER — Other Ambulatory Visit: Payer: Self-pay

## 2020-06-01 DIAGNOSIS — L84 Corns and callosities: Secondary | ICD-10-CM

## 2020-06-01 DIAGNOSIS — B351 Tinea unguium: Secondary | ICD-10-CM | POA: Diagnosis not present

## 2020-06-01 DIAGNOSIS — E1142 Type 2 diabetes mellitus with diabetic polyneuropathy: Secondary | ICD-10-CM | POA: Diagnosis not present

## 2020-06-01 DIAGNOSIS — Z794 Long term (current) use of insulin: Secondary | ICD-10-CM

## 2020-06-01 DIAGNOSIS — M79675 Pain in left toe(s): Secondary | ICD-10-CM | POA: Diagnosis not present

## 2020-06-01 DIAGNOSIS — M79674 Pain in right toe(s): Secondary | ICD-10-CM

## 2020-06-01 DIAGNOSIS — E119 Type 2 diabetes mellitus without complications: Secondary | ICD-10-CM

## 2020-06-01 DIAGNOSIS — M2041 Other hammer toe(s) (acquired), right foot: Secondary | ICD-10-CM

## 2020-06-01 DIAGNOSIS — M2042 Other hammer toe(s) (acquired), left foot: Secondary | ICD-10-CM

## 2020-06-05 NOTE — Progress Notes (Signed)
Subjective: [name] presents today preventative diabetic foot care and callus(es) right foot and painful mycotic toenails b/l that are difficult to trim. Pain interferes with ambulation. Aggravating factors include wearing enclosed shoe gear. Pain is relieved with periodic professional debridement.   He states blood glucose was 150 mg/dl today. He voices no new pedal concerns on today's visit.  Pleas Koch, NP is patient's PCP. Last visit was 01/08/2020.  Past Medical History:  Diagnosis Date  . Arthritis    knee  . Colon polyp 2008  . COPD with emphysema ([place])   . Diabetes mellitus   . Hypercholesteremia   . Hypertension   . Hypothyroidism   . Observed sleep apnea   . Statin myopathy      Current Outpatient Medications on File Prior to Visit  Medication Sig Dispense Refill  . albuterol (PROVENTIL HFA;VENTOLIN HFA) 108 (90 Base) MCG/ACT inhaler Inhale 2 puffs into the lungs every 4 (four) hours as needed for wheezing or shortness of breath. 1 Inhaler 0  . amLODipine (NORVASC) 5 MG tablet Take 5 mg by mouth daily.    Marland Kitchen aspirin EC 81 MG tablet Take 81 mg by mouth daily.    . Continuous Blood Gluc Sensor (FREESTYLE LIBRE 14 DAY SENSOR) MISC SMARTSIG:1 Each Topical Every 2 Weeks    . fenofibrate (TRICOR) 145 MG tablet Take 145 mg by mouth daily.    . hydrochlorothiazide (HYDRODIURIL) 50 MG tablet Take 1 tablet (50 mg total) by mouth daily. 90 tablet 1  . Insulin Degludec (TRESIBA FLEXTOUCH) 200 UNIT/ML SOPN Inject 70 Units into the skin daily.     Marland Kitchen levothyroxine (SYNTHROID) 75 MCG tablet Take 1 tablet by mouth every morning on an empty stomach with water only.  No food or other medications for 30 minutes. 90 tablet 0  . metFORMIN (GLUCOPHAGE) 1000 MG tablet TAKE 1 TABLET BY MOUTH TWICE A DAY 180 tablet 1  . NOVOLOG FLEXPEN 100 UNIT/ML FlexPen Inject 28 units before breakfast, inject 24 units beforelunch, inject 34 units before supper. Increase when blood sugar is over 150.  Use 2 units more for every 50 over.  9  . ramipril (ALTACE) 10 MG capsule TAKE 1 CAPSULE BY MOUTH 2 TIMES DAILY 180 capsule 1  . rosuvastatin (CRESTOR) 5 MG tablet Take 1 tablet (5 mg total) by mouth every evening. For cholesterol. 90 tablet 3  . TRUEPLUS 5-BEVEL PEN NEEDLES 31G X 6 MM MISC      No current facility-administered medications on file prior to visit.     Allergies  Allergen Reactions  . Atorvastatin     Other reaction(s): Headache  . Other Other (See Comments)    STATIN DRUG-CAUSED MUSCLE PAIN/LETHARGIC    Objective: [name] is a pleasant 69 y.o. Caucasian male, WD, WN in NAD. AAO x 3.   There were no vitals filed for this visit.  Vascular Examination: Neurovascular status unchanged b/l. Capillary fill time to digits <3 seconds b/l. Palpable PT pulses b/l. Faintly palpable DP pulses b/l. Pedal hair sparse b/l. Skin temperature gradient within normal limits b/l. No edema noted b/l.  Dermatological Examination: Pedal skin with normal turgor, texture and tone bilaterally. No open wounds bilaterally. No interdigital macerations bilaterally. Toenails 1-5 b/l elongated, discolored, dystrophic, thickened, crumbly with subungual debris and tenderness to dorsal palpation. Incurvated nailplate lateral border(s) L hallux.  Nail border hypertrophy minimal. There is tenderness to palpation. Sign(s) of infection: no clinical signs of infection noted on examination today.. Hyperkeratotic  lesion(s) submet head 3 right foot and submet head 5 right foot.  No erythema, no edema, no drainage, no fluctuance.  Musculoskeletal: Normal muscle strength 5/5 to all lower extremity muscle groups bilaterally. No pain crepitus or joint limitation noted with ROM b/l. Hammertoes noted to the 1-5 bilaterally.  Neurological Examination: Protective sensation intact 5/5 intact bilaterally with 10g monofilament b/l. Vibratory sensation intact b/l. Proprioception intact bilaterally.  Assessment: 1.  Pain due to onychomycosis of toenails of both feet   2. Callus   3. Acquired hammertoes of both feet   4. Type 2 diabetes mellitus without complication, with long-term current use of insulin ([place])    Plan: -Examined patient. -Continue diabetic foot care principles. -Toenails 1-5 b/l were debrided in length and girth with sterile nail nippers and dremel without iatrogenic bleeding.  -Offending nail border debrided and curretaged L hallux utilizing sterile nail nipper and currette. Border(s) cleansed with alcohol and triple antibiotic ointment applied. He was instructed to epsom salt soaks and apply Neoporin afterwards once daily  for 3 days. -Callus(es) submet head 3 right foot and submet head 5 right foot pared utilizing sterile scalpel blade without complication or incident. Total number debrided =2. -Patient to report any pedal injuries to medical professional immediately.  Return in about 3 months (around 09/01/2020).  Marzetta Board, DPM

## 2020-06-08 DIAGNOSIS — Z794 Long term (current) use of insulin: Secondary | ICD-10-CM | POA: Diagnosis not present

## 2020-06-08 DIAGNOSIS — E1169 Type 2 diabetes mellitus with other specified complication: Secondary | ICD-10-CM | POA: Diagnosis not present

## 2020-06-08 DIAGNOSIS — Z789 Other specified health status: Secondary | ICD-10-CM | POA: Diagnosis not present

## 2020-06-08 DIAGNOSIS — I10 Essential (primary) hypertension: Secondary | ICD-10-CM | POA: Diagnosis not present

## 2020-06-08 DIAGNOSIS — E1142 Type 2 diabetes mellitus with diabetic polyneuropathy: Secondary | ICD-10-CM | POA: Diagnosis not present

## 2020-06-08 DIAGNOSIS — E669 Obesity, unspecified: Secondary | ICD-10-CM | POA: Diagnosis not present

## 2020-06-13 DIAGNOSIS — G4733 Obstructive sleep apnea (adult) (pediatric): Secondary | ICD-10-CM | POA: Diagnosis not present

## 2020-06-13 DIAGNOSIS — R0902 Hypoxemia: Secondary | ICD-10-CM | POA: Diagnosis not present

## 2020-06-13 DIAGNOSIS — J449 Chronic obstructive pulmonary disease, unspecified: Secondary | ICD-10-CM | POA: Diagnosis not present

## 2020-06-16 DIAGNOSIS — I700 Atherosclerosis of aorta: Secondary | ICD-10-CM | POA: Insufficient documentation

## 2020-06-16 DIAGNOSIS — J439 Emphysema, unspecified: Secondary | ICD-10-CM

## 2020-06-16 HISTORY — DX: Emphysema, unspecified: J43.9

## 2020-06-16 HISTORY — DX: Atherosclerosis of aorta: I70.0

## 2020-06-24 ENCOUNTER — Ambulatory Visit: Payer: HMO

## 2020-06-24 ENCOUNTER — Other Ambulatory Visit: Payer: Self-pay

## 2020-06-24 ENCOUNTER — Telehealth: Payer: Self-pay | Admitting: *Deleted

## 2020-06-24 DIAGNOSIS — Z794 Long term (current) use of insulin: Secondary | ICD-10-CM

## 2020-06-24 DIAGNOSIS — E7800 Pure hypercholesterolemia, unspecified: Secondary | ICD-10-CM

## 2020-06-24 DIAGNOSIS — E119 Type 2 diabetes mellitus without complications: Secondary | ICD-10-CM

## 2020-06-24 DIAGNOSIS — Z87891 Personal history of nicotine dependence: Secondary | ICD-10-CM

## 2020-06-24 DIAGNOSIS — Z122 Encounter for screening for malignant neoplasm of respiratory organs: Secondary | ICD-10-CM

## 2020-06-24 NOTE — Telephone Encounter (Signed)
Patient is scheduled for ct, patient is a former smoker, quit 12/11/14, 70 pack year history.

## 2020-06-24 NOTE — Addendum Note (Signed)
Addended by: Lieutenant Diego on: 06/24/2020 12:09 PM   Modules accepted: Orders

## 2020-06-24 NOTE — Telephone Encounter (Signed)
Contacted in attempt to schedule lung screening scan. Patient will call back when he knows his availability for scheduling.

## 2020-06-24 NOTE — Chronic Care Management (AMB) (Signed)
Chronic Care Management Pharmacy   Name: [name]          MRN: [id number]        DOB: 28-Mar-1951   Chief Complaint/ HPI   [name],  69 y.o. , male presents for their follow CCM visit with the clinical pharmacist via telephone due to COVID-19 Pandemic.   PCP : Pleas Koch, NP   Their chronic conditions include: hypertension, allergic rhinitis, COPD, chronic constipation, GERD, hypothyroidism, diabetes mellitus, osteoarthritis, hypercholesterolemia  Last CCM - 03/12/20, start Crestor 5 mg daily   Office Visits:  04/26/20: [facility] - be sure to take levothyroxine on empty stomach separate from other medications  01/08/2020 - nevus change in appearance. Removed and sent to pathology.   11/12/2019 - [name] for knee pain - bilateral knee steroid injection. Working to get approval for visco injections (monovisc cost too high - ~$1200).   10/07/2019 - Pfizer COVID vaccine.   Consult Visit:  06/08/20: Endocrinology [name] - continue current regimen for diabetes, switch to freestyle libre 2   02/24/2020 - Podiatry - pain due to onychomycosis of toenails/diabetes foot care.   02/17/2020 - Endocrinology [name] - Reassured him that diabetes is well controlled. Continue current regimen for diabetes including Tresiba U200 (70 units qhs) and NovoLog (28 units before breakfast, 24 units before lunch, and 34 units before supper) as prescribed. Discussed inaccuracy of his [name]. Will change to DexCom G6. A receiver, transmitter and sensors were sent to his local pharmayc, where has has previously been able to get all diabetes supplies including the [place], filled without cost. Continue ACE-I for renal protection. Lipids controlled. Continue fibrate.Not on statin due to h/o statin-induced myalgias.   02/16/2020 - Urgent Care - Acute sinusitis.  Treating with amoxicillin.  PCR COVID pending.  Instructed patient to self quarantine until the test result is back.  Instructed him to  follow-up with his PCP if his symptoms are not improving.  Patient agrees to plan of care.    02/16/2020 - Endocrin[name] - televisit scheduled due to possible COVID. BS range at home 50-210.   12/03/2019 - Podiatry - diabetes foot care. Pain due to onychomycosis of toenails of both feet.   11/10/2019 - [name] - Target a1c is 7.5%. Continue taking [place] and Novolog as prescribed. Not on statin due to statin-induced myalgias.   Current Outpatient Medications on File Prior to Visit  Medication Sig Dispense Refill  . albuterol (PROVENTIL HFA;VENTOLIN HFA) 108 (90 Base) MCG/ACT inhaler Inhale 2 puffs into the lungs every 4 (four) hours as needed for wheezing or shortness of breath. 1 Inhaler 0  . amLODipine (NORVASC) 5 MG tablet Take 5 mg by mouth daily.    Marland Kitchen aspirin EC 81 MG tablet Take 81 mg by mouth daily.    . Continuous Blood Gluc Sensor (FREESTYLE LIBRE 14 DAY SENSOR) MISC SMARTSIG:1 Each Topical Every 2 Weeks    . fenofibrate (TRICOR) 145 MG tablet Take 145 mg by mouth daily.    . hydrochlorothiazide (HYDRODIURIL) 50 MG tablet Take 1 tablet (50 mg total) by mouth daily. 90 tablet 1  . Insulin Degludec (TRESIBA FLEXTOUCH) 200 UNIT/ML SOPN Inject 70 Units into the skin daily.     Marland Kitchen levothyroxine (SYNTHROID) 75 MCG tablet Take 1 tablet by mouth every morning on an empty stomach with water only.  No food or other medications for 30 minutes. 90 tablet 0  . metFORMIN (GLUCOPHAGE) 1000 MG tablet TAKE 1 TABLET  BY MOUTH TWICE A DAY 180 tablet 1  . NOVOLOG FLEXPEN 100 UNIT/ML FlexPen Inject 28 units before breakfast, inject 24 units beforelunch, inject 34 units before supper. Increase when blood sugar is over 150. Use 2 units more for every 50 over.  9  . ramipril (ALTACE) 10 MG capsule TAKE 1 CAPSULE BY MOUTH 2 TIMES DAILY 180 capsule 1  . rosuvastatin (CRESTOR) 5 MG tablet Take 1 tablet (5 mg total) by mouth every evening. For cholesterol. 90 tablet 3  . TRUEPLUS 5-BEVEL PEN NEEDLES 31G X 6 MM  MISC      No current facility-administered medications on file prior to visit.   Allergies  Allergen Reactions  . Atorvastatin     Other reaction(s): Headache  . Other Other (See Comments)    STATIN DRUG-CAUSED MUSCLE PAIN/LETHARGIC     Current Diagnosis/Assessment:   Goals    . Pharmacy Care Plan     CARE PLAN ENTRY (see longitudinal plan of care for additional care plan information)  Current Barriers:  . Chronic Disease Management support, education, and care coordination needs related to Hypertension, Hyperlipidemia, and Diabetes   Hypertension BP Readings from Last 3 Encounters:  02/16/20 (!) 157/67  01/08/20 124/70  11/12/19 140/70   . Pharmacist Clinical Goal(s): o Over the next 90 days, patient will work with PharmD and providers to maintain BP goal <130/80 . Current regimen:   Amlodipine 5 mg - 1 tablet daily  Hydrochlorothiazide 50 mg - 1 tablet daily  Ramipril 10 mg - 1 tablet twice daily . Interventions: o Patient has a wrist cuff at home and checks daily.  . Patient self care activities - Over the next 90 days, patient will: o Check BP daily, document, and provide at future appointments o Ensure daily salt intake < 2300 mg/day  Hyperlipidemia Lab Results  Component Value Date/Time   LDLCALC 97 06/01/2016 07:50 AM   LDLCALC 82 02/19/2015 09:23 AM   . Pharmacist Clinical Goal(s): o Over the next 90 days, patient will work with PharmD and providers to achieve LDL goal < 70 mg/dL  . Current regimen:  . Aspirin EC 81 mg - 1 tablet daily . Fenofibrate 145 mg - 1 tablet daily . Rosuvastatin 5 mg - 1 tablet daily . Interventions: o Reviewed tolerance of new statin, patient is doing well . Patient self care activities - Over the next 90 days, patient will: o Continue current medications as prescribed  Diabetes Lab Results  Component Value Date/Time   HGBA1C 7.5 (H) 06/01/2016 07:50 AM   HGBA1C 8.7 11/26/2015 08:55 AM   HGBA1C 8.0 08/27/2015 09:58 AM   Recent A1c per Dr. Gabriel Carina note - 7.3% . Pharmacist Clinical Goal(s): o Over the next 90 days, patient will work with PharmD and providers to maintain A1c goal 7.5% . Current regimen:   Tresiba 200 unit/ml 70 units into the skin daily  Metformin 1000 mg twice daily  Novolog 100 unit/ml 28 units before breakfast/24 units before lunch/34 units before supper. Increase when blood sugar is over 150. Use 2 units more for every 50 over.   Freestyle Libre CGM  Trueplus pen needles . Interventions: o Endocrinology's goal for a1c is 7.5% due to history of low blood sugar.  o Reviewed chart - patient was changed to freestyle libre 2  o Coordinate follow up to ensure patient new monitor is working well  . Patient self care activities - Over the next 90 days, patient will: o Check blood sugar 3-4  times daily, document, and provide at future appointments o Contact provider with any episodes of hypoglycemia  Medication management . Pharmacist Clinical Goal(s): o Over the next 90 days, patient will work with PharmD and providers to maintain optimal medication adherence . Current pharmacy: [facility] . Interventions o Comprehensive medication review performed. o Continue current medication management strategy . Patient self care activities - Over the next 90 days, patient will: o Focus on medication adherence by continuing to use pill box o Take medications as prescribed o Report any questions or concerns to PharmD and/or provider(s)  Please see past updates related to this goal by clicking on the "Past Updates" button in the selected goal          COPD / Asthma / Tobacco   Tobacco Status:  Social History   Tobacco Use  Smoking Status Former Smoker  . Packs/day: 2.00  . Years: 35.00  . Pack years: 70.00  . Types: Cigarettes, E-cigarettes  . Quit date: 12/11/2014  . Years since quitting: 5.5  Smokeless Tobacco Never Used   Patient has tried these meds in past: flonase,  cetirizine, montelukast Patient is currently controlled on the following medications:   Albuterol Inhaler - 2 puffs every 4 hours prn wheezing or shortness of breath  Cetirizine OTC 10 mg daily Using maintenance inhaler regularly? No Frequency of rescue inhaler use:  infrequently   We discussed: Proper inhaler technique. Patient has to use albuterol 1-2 times each year. Taking a flu shot has reduced his flares with difficulty breathing. Patient quit smoking about 5 years ago.  No update/changes 06/24/20   Plan: Continue current medications  Diabetes    A1c goal < 7.5% due to history of hypoglycemia Recent Relevant Labs:  7.3% per Dr. Joycie Peek August 2021 note   Patient has failed these meds in past: Bydureon, glipizide, humalog kwikpen, lantus, Januvia Patient is currently controlled on the following medications:   Tresiba 200 unit/ml - Inject 70 units into the skin daily  Metformin 1000 mg BID  Novolog 100 unit/ml - Inject 28 units before breakfast/24 units before lunch/34 units before supper. Increase when blood sugar is over 150. Use 2 units more for every 50 over.  Freestyle Libre CGM  Trueplus pen needles    Exercise - Uses pedal exerciser for 10 minutes 3 times each week. Limited due to knee pain  Diet - Patient mostly eats a lot of chicken, lean red meat, vegetables. He indicates that he eats very little bread. Eats a salad daily. When he eats out at [employer] and chips seem to drive up his blood sugar.    Update 06/24/20: Patient was transitioned from [facility] 1 to 2 at last endocrinology visit 11/23. Dexcom was not covered by insurance. He was given a sample reader and sensors were sent to pharmacy.   Plan: Continue current medications    Hyperlipidemia    LDL goal < 70 mg/dL   Lipid Panel from Dr. Gabriel Carina Component Value Date  CHOLTOTAL 143 02/10/2020  LDLCALC 80 02/10/2020  HDL 27.1 (L) 02/10/2020  TRIG 182 02/10/2020   Last  lipids Lab Results  Component Value Date   CHOL 152 06/01/2016   HDL 33.70 (L) 06/01/2016   LDLCALC 97 06/01/2016   TRIG 106.0 06/01/2016   CHOLHDL 4 06/01/2016   Hepatic Function Latest Ref Rng & Units 06/01/2016 02/19/2015 12/18/2014  Total Protein 6.0 - 8.3 g/dL 7.1 6.9 6.9  Albumin 3.5 - 5.2 g/dL 4.5 4.7 4.7  AST 0 - 37 U/L 33 22 19  ALT 0 - 53 U/L 43 41 27  Alk Phosphatase 39 - 117 U/L 46 62 53  Total Bilirubin 0.2 - 1.2 mg/dL 0.3 0.3 0.4    The ASCVD Risk score ([place]., et al., 2013) failed to calculate for the following reasons:   The patient has a prior MI or stroke diagnosis    Patient has failed these meds in past: pravastatin - joint pain  Patient is currently uncontrolled on the following medications:   Aspirin EC 81 mg - 1 tablet daily  Fenofibrate 145 mg - 1 tablet daily  Rosuvastatin 5 mg - 1 tablet daily   Pt started on crestor 5 mg at last CCM visit 03/12/20.   Update 06/24/20: Denies muscle cramps or weakness from statin, taking daily   Plan: Continue current medications   Hypertension    BP goal is:  <130/80  CMP from 2017 - unsure if patient is getting labs done outside clinic    CMP Latest Ref Rng & Units 06/01/2016 02/19/2015 12/18/2014  Glucose 70 - 99 mg/dL 102(H) 188(H) 138(H)  BUN 6 - 23 mg/dL '15 20 25  ' Creatinine 0.40 - 1.50 mg/dL 1.07 1.05 0.95  Sodium 135 - 145 mEq/L 142 139 139  Potassium 3.5 - 5.1 mEq/L 4.4 4.5 4.7  Chloride 96 - 112 mEq/L 102 99 97  CO2 19 - 32 mEq/L 32 24 24  Calcium 8.4 - 10.5 mg/dL 10.1 10.0 10.0  Total Protein 6.0 - 8.3 g/dL 7.1 6.9 6.9  Total Bilirubin 0.2 - 1.2 mg/dL 0.3 0.3 0.4  Alkaline Phos 39 - 117 U/L 46 62 53  AST 0 - 37 U/L 33 22 19  ALT 0 - 53 U/L 43 41 27   Office blood pressures are: BP Readings from Last 3 Encounters:  04/26/20 136/78  02/16/20 (!) 157/67  01/08/20 124/70   Patient has failed these meds in the past: none reported.  Patient is currently controlled on the following medications:    Amlodipine 5 mg - 1 tablet daily   Hydrochlorothiazide 50 mg - 1 tablet daily  Ramipril 10 mg - 1 tablet BID   Update 06/24/20:  Patient checks BP at home daily Patient home BP readings are ranging: 130s/80s mmHg    Plan: Continue current medications; Review outside labs for CMP.    Hypothyroidism    Lab Results  Component Value Date/Time   TSH 5.44 (H) 05/25/2020 09:24 AM   TSH 9.30 (H) 04/26/2020 10:38 AM   FREET4 0.89 07/04/2016 08:44 AM   Patient has failed these meds in past: none reported Patient is currently uncontrolled on the following medications:   Levothyroxine 75 mcg daily - 1 tablet on an empty stomach (dose increased from 50 mcg 05/26/20)   We discussed:  Take on empty stomach 1 hour before food or medications    Update 06/24/20: Pt dose is being adjusted by PCP currently with plan for f/u. TSH WNL for age.   Plan: Continue current medications   Vaccines    Reviewed and discussed patient's vaccination history.         Immunization History  Administered Date(s) Administered  . Fluad Quad(high Dose 65+) 04/15/2019  . Influenza Split 04/26/2007  . Influenza,inj,Quad PF,6+ Mos 03/18/2015, 05/10/2017, 04/08/2018  . Influenza-Unspecified 03/18/2015, 04/16/2016, 05/10/2017  . PFIZER SARS-COV-2 Vaccination 09/16/2019, 10/07/2019  . Pneumococcal Conjugate-13 09/25/2017  . Pneumococcal Polysaccharide-23 06/19/2011, 06/05/2016  . Tdap 06/19/2011, 04/01/2015  .  Zoster Recombinat (Shingrix) 04/15/2019, 06/17/2019      Plan: Recommend patient receive annual flu vaccine in office.    Medication Management    Pt uses [facility] for all medications Uses pill box? Yes Pt endorses excellent compliance   We discussed: Uses a pill tray to keep medications organized. Denies missed doses.    Plan: Continue current medication management strategy   Follow up:  6 months telephone visit; CMA call to review libre accuracy in 1 month  Debbora Dus,  PharmD Clinical Pharmacist [facility] at [facility] [phone]

## 2020-07-02 ENCOUNTER — Ambulatory Visit
Admission: RE | Admit: 2020-07-02 | Discharge: 2020-07-02 | Disposition: A | Discharge status: Home / Self Care | Payer: HMO | Source: Ambulatory Visit | Attending: Nurse Practitioner | Admitting: Nurse Practitioner

## 2020-07-02 ENCOUNTER — Other Ambulatory Visit: Payer: Self-pay

## 2020-07-02 DIAGNOSIS — Z122 Encounter for screening for malignant neoplasm of respiratory organs: Secondary | ICD-10-CM | POA: Insufficient documentation

## 2020-07-02 DIAGNOSIS — Z87891 Personal history of nicotine dependence: Secondary | ICD-10-CM | POA: Insufficient documentation

## 2020-07-02 DIAGNOSIS — J439 Emphysema, unspecified: Secondary | ICD-10-CM | POA: Diagnosis not present

## 2020-07-04 NOTE — Patient Instructions (Signed)
Dear [name],  Below is a summary of the goals we discussed during our follow up appointment on July 04, 2020. Please contact me anytime with questions or concerns.   Visit Information  Goals Addressed            This Visit's Progress   . Pharmacy Care Plan       CARE PLAN ENTRY (see longitudinal plan of care for additional care plan information)  Current Barriers:  . Chronic Disease Management support, education, and care coordination needs related to Hypertension, Hyperlipidemia, and Diabetes   Hypertension BP Readings from Last 3 Encounters:  02/16/20 (!) 157/67  01/08/20 124/70  11/12/19 140/70   . Pharmacist Clinical Goal(s): o Over the next 90 days, patient will work with PharmD and providers to maintain BP goal <130/80 . Current regimen:   Amlodipine 5 mg - 1 tablet daily  Hydrochlorothiazide 50 mg - 1 tablet daily  Ramipril 10 mg - 1 tablet twice daily . Interventions: o Patient has a wrist cuff at home and checks daily.  . Patient self care activities - Over the next 90 days, patient will: o Check BP daily, document, and provide at future appointments o Ensure daily salt intake < 2300 mg/day  Hyperlipidemia Lab Results  Component Value Date/Time   LDLCALC 97 06/01/2016 07:50 AM   LDLCALC 82 02/19/2015 09:23 AM   . Pharmacist Clinical Goal(s): o Over the next 90 days, patient will work with PharmD and providers to achieve LDL goal < 70 mg/dL  . Current regimen:  . Aspirin EC 81 mg - 1 tablet daily . Fenofibrate 145 mg - 1 tablet daily . Rosuvastatin 5 mg - 1 tablet daily . Interventions: o Reviewed tolerance of new statin, patient is doing well . Patient self care activities - Over the next 90 days, patient will: o Continue current medications as prescribed  Diabetes Lab Results  Component Value Date/Time   HGBA1C 7.5 (H) 06/01/2016 07:50 AM   HGBA1C 8.7 11/26/2015 08:55 AM   HGBA1C 8.0 08/27/2015 09:58 AM  Recent A1c per Dr. Gabriel Carina note  - 7.3% . Pharmacist Clinical Goal(s): o Over the next 90 days, patient will work with PharmD and providers to maintain A1c goal 7.5% . Current regimen:   Tresiba 200 unit/ml 70 units into the skin daily  Metformin 1000 mg twice daily  Novolog 100 unit/ml 28 units before breakfast/24 units before lunch/34 units before supper. Increase when blood sugar is over 150. Use 2 units more for every 50 over.   Freestyle Libre CGM  Trueplus pen needles . Interventions: o Endocrinology's goal for a1c is 7.5% due to history of low blood sugar.  o Reviewed chart - patient was changed to freestyle libre 2  o Coordinate follow up to ensure patient new monitor is working well  . Patient self care activities - Over the next 90 days, patient will: o Check blood sugar 3-4 times daily, document, and provide at future appointments o Contact provider with any episodes of hypoglycemia  Medication management . Pharmacist Clinical Goal(s): o Over the next 90 days, patient will work with PharmD and providers to maintain optimal medication adherence . Current pharmacy: [facility] . Interventions o Comprehensive medication review performed. o Continue current medication management strategy . Patient self care activities - Over the next 90 days, patient will: o Focus on medication adherence by continuing to use pill box o Take medications as prescribed o Report any questions or concerns to PharmD and/or  provider(s)  Please see past updates related to this goal by clicking on the "Past Updates" button in the selected goal         The patient verbalized understanding of instructions, educational materials, and care plan provided today and declined offer to receive copy of patient instructions, educational materials, and care plan.   The pharmacy team will reach out to the patient again over the next 30 days.   Debbora Dus, PharmD Clinical Pharmacist [facility] at [facility] [phone]   Basics of Medicine Management Taking your medicines correctly is an important part of managing or preventing medical problems. Make sure you know what disease or condition your medicine is treating, and how and when to take it. If you do not take your medicine correctly, it may not work well and may cause unpleasant side effects, including serious health problems. What should I do when I am taking medicines?   Read all the labels and inserts that come with your medicines. Review the information often.  Talk with your pharmacist if you get a refill and notice a change in the size, color, or shape of your medicines.  Know the potential side effects for each medicine that you take.  Try to get all your medicines from the same pharmacy. The pharmacist will have all your information and will understand how your medicines will affect each other (interact).  Tell your health care provider about all your medicines, including over-the-counter medicines, vitamins, and herbal or dietary supplements. He or she will make sure that nothing will interact with any of your prescribed medicines. How can I take my medicines safely?  Take medicines only as told by your health care provider. ? Do not take more of your medicine than instructed. ? Do not take anyone else's medicines. ? Do not share your medicines with others. ? Do not stop taking your medicines unless your health care provider tells you to do so. ? You may need to avoid alcohol or certain foods or liquids when taking certain medicines. Follow your health care provider's instructions.  Do not split, mash, or chew your medicines unless your health care provider tells you to do so. Tell your health care provider if you have trouble swallowing your medicines.  For liquid medicine, use the dosing container that was provided. How should I organize my medicines?  Know your medicines  Know what each of your medicines looks  like. This includes size, color, and shape. Tell your health care provider if you are having trouble recognizing all the medicines that you are taking.  If you cannot tell your medicines apart because they look similar, keep them in original bottles.  If you cannot read the labels on the bottles, tell your pharmacist to put your medicines in containers with large print.  Review your medicines and your schedule with family members, a friend, or a caregiver. Use a pill organizer  Use a tool to organize your medicine schedule. Tools include a weekly pillbox, a written chart, a notebook, or a calendar.  Your tool should help you remember the following things about each medicine: ? The name of the medicine. ? The amount (dose) to take. ? The schedule. This is the day and time the medicine should be taken. ? The appearance. This includes color, shape, size, and stamp. ? How to take your medicines. This includes instructions to take them with food, without food, with fluids, or with other medicines.  Create reminders for taking your  medicines. Use sticky notes, or alarms on your watch, mobile device, or phone calendar.  You may choose to use a more advanced management system. These systems have storage, alarms, and visual and audio prompts.  Some medicines can be taken on an "as-needed" basis. These include medicines for nausea or pain. If you take an as-needed medicine, write down the name and dose, as well as the date and time that you took it. How should I plan for travel?  Take your pillbox, medicines, and organization system with you when traveling.  Have your medicines refilled before you travel. This will ensure that you do not run out of your medicines while you are away from home.  Always carry an updated list of your medicines with you. If there is an emergency, a first responder can quickly see what medicines you are taking.  Do not pack your medicines in checked luggage in case  your luggage is lost or delayed.  If any of your medicines is considered a controlled substance, make sure you bring a letter from your health care provider with you. How should I store and discard my medicines? For safe storage:  Store medicines in a cool, dry area away from light, or as directed by your health care provider. Do not store medicines in the bathroom. Heat and humidity will affect them.  Do not store your medicines with other chemicals, or with medicines for pets or other household members.  Keep medicines away from children and pets. Do not leave them on counters or bedside tables. Store them in high cabinets or on high shelves. For safe disposal:  Check expiration dates regularly. Do not take expired medicines. Discard medicines that are older than the expiration date.  Learn a safe way to dispose of your medicines. You may: ? Use a local government, hospital, or pharmacy medicine-take-back program. ? Mix the medicines with inedible substances, put them in a sealed bag or empty container, and throw them in the trash. What should I remember?  Tell your health care provider if you: ? Experience side effects. ? Have new symptoms. ? Have other concerns about taking your medicines.  Review your medicines regularly with your health care provider. Other medicines, diet, medical conditions, weight changes, and daily habits can all affect how medicines work. Ask if you need to continue taking each medicine, and discuss how well each one is working.  Refill your medicines early to avoid running out of them.  In case of an accidental overdose, call your local [facility] at [phone] or visit your local emergency department immediately. This is important. Summary  Taking your medicines correctly is an important part of managing or preventing medical problems.  You need to make sure that you understand what you are taking a medicine for, as well as how and when  you need to take it.  Know your medicines and use a pill organizer to help you take your medicines correctly.  In case of an accidental overdose, call your local [facility] at [phone] or visit your local emergency department immediately. This is important. This information is not intended to replace advice given to you by your health care provider. Make sure you discuss any questions you have with your health care provider. Document Revised: 06/28/2017 Document Reviewed: 06/28/2017 Elsevier Patient Education  2020 [employer].

## 2020-07-07 ENCOUNTER — Encounter: Payer: Self-pay | Admitting: *Deleted

## 2020-07-07 DIAGNOSIS — I2510 Atherosclerotic heart disease of native coronary artery without angina pectoris: Secondary | ICD-10-CM

## 2020-07-07 DIAGNOSIS — E7800 Pure hypercholesterolemia, unspecified: Secondary | ICD-10-CM

## 2020-07-07 DIAGNOSIS — E039 Hypothyroidism, unspecified: Secondary | ICD-10-CM

## 2020-07-08 MED ORDER — ROSUVASTATIN CALCIUM 10 MG PO TABS: 10.0000 mg | ORAL_TABLET | Freq: Every day | ORAL | 3 refills | Status: DC

## 2020-07-12 ENCOUNTER — Other Ambulatory Visit: Payer: Self-pay

## 2020-07-12 ENCOUNTER — Ambulatory Visit
Admission: EM | Admit: 2020-07-12 | Discharge: 2020-07-12 | Disposition: A | Discharge status: Home / Self Care | Payer: HMO | Attending: Family Medicine | Admitting: Family Medicine

## 2020-07-12 DIAGNOSIS — J0110 Acute frontal sinusitis, unspecified: Secondary | ICD-10-CM | POA: Diagnosis not present

## 2020-07-12 DIAGNOSIS — Z7689 Persons encountering health services in other specified circumstances: Secondary | ICD-10-CM | POA: Diagnosis not present

## 2020-07-12 MED ORDER — AMOXICILLIN 500 MG PO CAPS: 1000.0000 mg | ORAL_CAPSULE | Freq: Three times a day (TID) | ORAL | 0 refills | Status: AC

## 2020-07-12 NOTE — ED Triage Notes (Signed)
Pt presents with sinus pressure, nasal drainage esp in morning, periodic productive cough (medium green mucous).  No fever, no SOB.   OTC allergy meds only helping for 2-3 hours.    Pt states baseline O2 with his COPD is about 90.

## 2020-07-12 NOTE — Discharge Instructions (Addendum)
Treating for sinus infection Medicine as prescribed  Follow up as needed for continued or worsening symptoms

## 2020-07-12 NOTE — ED Provider Notes (Signed)
[name]    CSN: [id number] Arrival date & time: 07/12/20  0854      History   Chief Complaint Chief Complaint  Patient presents with  . Sinusitis    HPI [name] is a 69 y.o. male.   69 year old male with past medical history of arthritis, COPD, diabetes, high cholesterol, hypertension, hypothyroid.  He presents today with sinus congestion, sinus pressure, nasal drainage, productive cough with green mucus.  This has been continuous over the past week.  Over-the-counter allergy meds only slightly helping.  No fever, chills, chest pain or shortness of breath.     Past Medical History:  Diagnosis Date  . Arthritis    knee  . Colon polyp 2008  . COPD with emphysema ([place])   . Diabetes mellitus   . Hypercholesteremia   . Hypertension   . Hypothyroidism   . Observed sleep apnea   . Statin myopathy     Patient Active Problem List   Diagnosis Date Noted  . Nevus 01/08/2020  . Welcome to Medicare preventive visit 09/25/2017  . Personal history of tobacco use, presenting hazards to health 06/30/2016  . Preventative health care 06/05/2016  . Osteoarthritis of both knees 11/09/2015  . Diabetes mellitus ([place]) 06/14/2015  . Allergic rhinitis 05/28/2015  . Morbid obesity with BMI of 40.0-44.9, adult ([place]) 05/28/2015  . Chronic constipation 05/28/2015  . Chronic obstructive pulmonary disease ([place]) 05/28/2015  . Essential (primary) hypertension 05/28/2015  . Hypercholesterolemia 05/28/2015  . Gastro-esophageal reflux disease without esophagitis 05/28/2015  . Hypothyroidism 05/28/2015  . Dermatofibroma of left lower leg 04/01/2015  . Former smoker 03/04/2015  . History of colonic polyps 08/20/2014  . Idiopathic localized osteoarthropathy 03/16/2014    Past Surgical History:  Procedure Laterality Date  . COLONOSCOPY  2008, 2016   Dr Bary Castilla  . COLONOSCOPY WITH PROPOFOL N/A 10/03/2017   Procedure: COLONOSCOPY WITH PROPOFOL;  Surgeon: Robert Bellow,  MD;  Location: [facility] ENDOSCOPY;  Service: Endoscopy;  Laterality: N/A;  . DERMATOFIBROMA  03/26/15   Procedure: Excision mass left leg;  Surgeon: Robert Bellow, MD;  Location: [facility] ORS;  Service: General;  Laterality: N/A;  . LIPOMA EXCISION N/A 03/26/2015      . TONSILLECTOMY         Home Medications    Prior to Admission medications   Medication Sig Start Date End Date Taking? Authorizing Provider  albuterol (PROVENTIL HFA;VENTOLIN HFA) 108 (90 Base) MCG/ACT inhaler Inhale 2 puffs into the lungs every 4 (four) hours as needed for wheezing or shortness of breath. 07/25/18  Yes Laverle Hobby, MD  amLODipine (NORVASC) 5 MG tablet Take 5 mg by mouth daily. 08/12/19  Yes [provider]  aspirin EC 81 MG tablet Take 81 mg by mouth daily.   Yes [provider]  Continuous Blood Gluc Sensor (FREESTYLE LIBRE 14 DAY SENSOR) MISC SMARTSIG:1 Each Topical Every 2 Weeks 10/31/19  Yes [provider]  fenofibrate (TRICOR) 145 MG tablet Take 145 mg by mouth daily. 09/19/17  Yes [provider]  hydrochlorothiazide (HYDRODIURIL) 50 MG tablet Take 1 tablet (50 mg total) by mouth daily. 08/21/17  Yes Pleas Koch, NP  insulin degludec (TRESIBA) 200 UNIT/ML FlexTouch Pen Inject 70 Units into the skin daily.    Yes [provider]  levothyroxine (SYNTHROID) 75 MCG tablet Take 1 tablet by mouth every morning on an empty stomach with water only.  No food or other medications for 30 minutes. 05/26/20  Yes Clark,  Leticia Penna, NP  metFORMIN (GLUCOPHAGE) 1000 MG tablet TAKE 1 TABLET BY MOUTH TWICE A DAY 03/13/16  Yes Chauvin, Robert, PA  NOVOLOG FLEXPEN 100 UNIT/ML FlexPen Inject 28 units before breakfast, inject 24 units beforelunch, inject 34 units before supper. Increase when blood sugar is over 150. Use 2 units more for every 50 over. 04/04/17  Yes [provider]  ramipril (ALTACE) 10 MG capsule TAKE 1 CAPSULE BY MOUTH 2 TIMES DAILY 11/06/17  Yes Pleas Koch, NP  rosuvastatin (CRESTOR) 10 MG tablet Take 1 tablet (10 mg total) by mouth daily. For cholesterol. 07/08/20  Yes Pleas Koch, NP  TRUEPLUS 5-BEVEL PEN NEEDLES 31G X 6 MM MISC  10/20/19  Yes [provider]  amoxicillin (AMOXIL) 500 MG capsule Take 2 capsules (1,000 mg total) by mouth 3 (three) times daily for 5 days. 07/12/20 07/17/20  Orvan July, NP    Family History Family History  Problem Relation Age of Onset  . Stroke Mother   . Diabetes Mother   . Stroke Father   . Diabetes Other   . Other Daughter 25       precancerous colon polyp   . Diabetes Maternal Grandmother   . Diabetes Paternal Grandmother     Social History Social History   Tobacco Use  . Smoking status: Former Smoker    Packs/day: 2.00    Years: 35.00    Pack years: 70.00    Types: Cigarettes, E-cigarettes    Quit date: 12/11/2014    Years since quitting: 5.5  . Smokeless tobacco: Never Used  Vaping Use  . Vaping Use: Never used  Substance Use Topics  . Alcohol use: No    Alcohol/week: 0.0 standard drinks  . Drug use: No     Allergies   Atorvastatin and Other   Review of Systems Review of Systems   Physical Exam Triage Vital Signs ED Triage Vitals [07/12/20 0917]  Enc Vitals Group     BP 133/82     Pulse Rate 88     Resp 18     Temp 98.2 F (36.8 C)     Temp Source Oral     SpO2 93 %     Weight      Height      Head Circumference      Peak Flow      Pain Score 0     Pain Loc      Pain Edu?      Excl. in [place]?    No data found.  Updated Vital Signs BP 133/82 (BP Location: Left Arm)   Pulse 88   Temp 98.2 F (36.8 C) (Oral)   Resp 18   SpO2 93%   Visual Acuity Right Eye Distance:   Left Eye Distance:   Bilateral Distance:    Right Eye Near:   Left Eye Near:    Bilateral Near:     Physical Exam Vitals and nursing note reviewed.  Constitutional:      General: He is not in acute distress.    Appearance: Normal appearance. He is not  ill-appearing, toxic-appearing or diaphoretic.  HENT:     Head: Normocephalic and atraumatic.     Right Ear: Tympanic membrane and ear canal normal.     Left Ear: Tympanic membrane and ear canal normal.     Nose: Congestion present.  Eyes:     Conjunctiva/sclera: Conjunctivae normal.  Cardiovascular:     Rate and Rhythm: Normal  rate and regular rhythm.  Pulmonary:     Effort: Pulmonary effort is normal.     Breath sounds: Normal breath sounds.     Comments: Decreased lung sounds in bases  Musculoskeletal:        General: Normal range of motion.     Cervical back: Normal range of motion.  Skin:    General: Skin is warm and dry.  Neurological:     Mental Status: He is alert.  Psychiatric:        Mood and Affect: Mood normal.      UC Treatments / Results  Labs (all labs ordered are listed, but only abnormal results are displayed) Labs Reviewed  NOVEL CORONAVIRUS, NAA    EKG   Radiology No results found.  Procedures Procedures (including critical care time)  Medications Ordered in UC Medications - No data to display  Initial Impression / Assessment and Plan / UC Course  I have reviewed the triage vital signs and the nursing notes.  Pertinent labs & imaging results that were available during my care of the patient were reviewed by me and considered in my medical decision making (see chart for details).     Sinusitis Treating for sinus infection with amoxicillin. He can continue over-the-counter medicines as needed for symptoms. Follow up as needed for continued or worsening symptoms  Final Clinical Impressions(s) / UC Diagnoses   Final diagnoses:  Acute non-recurrent frontal sinusitis     Discharge Instructions     Treating for sinus infection Medicine as prescribed  Follow up as needed for continued or worsening symptoms     ED Prescriptions    Medication Sig Dispense Auth. Provider   amoxicillin (AMOXIL) 500 MG capsule Take 2 capsules (1,000 mg  total) by mouth 3 (three) times daily for 5 days. 30 capsule Chevy Sweigert A, NP     PDMP not reviewed this encounter.   Janace Aris, NP 07/12/20 [phone]

## 2020-07-13 DIAGNOSIS — G4733 Obstructive sleep apnea (adult) (pediatric): Secondary | ICD-10-CM | POA: Diagnosis not present

## 2020-07-13 DIAGNOSIS — R0902 Hypoxemia: Secondary | ICD-10-CM | POA: Diagnosis not present

## 2020-07-13 DIAGNOSIS — J449 Chronic obstructive pulmonary disease, unspecified: Secondary | ICD-10-CM | POA: Diagnosis not present

## 2020-07-14 LAB — NOVEL CORONAVIRUS, NAA: SARS-CoV-2, NAA: NOT DETECTED

## 2020-07-14 LAB — SARS-COV-2, NAA 2 DAY TAT

## 2020-07-21 ENCOUNTER — Other Ambulatory Visit: Payer: Self-pay | Admitting: *Deleted

## 2020-07-21 DIAGNOSIS — Z1152 Encounter for screening for COVID-19: Secondary | ICD-10-CM

## 2020-07-23 ENCOUNTER — Telehealth: Payer: Self-pay

## 2020-07-23 ENCOUNTER — Other Ambulatory Visit: Payer: HMO

## 2020-07-23 DIAGNOSIS — Z1152 Encounter for screening for COVID-19: Secondary | ICD-10-CM | POA: Diagnosis not present

## 2020-07-23 NOTE — Chronic Care Management (AMB) (Addendum)
Chronic Care Management Pharmacy Assistant   Name: [name]  MRN: [id number] DOB: 1951-06-29  Reason for Encounter: Disease State  Patient Questions:  1.  Have you seen any other providers since your last visit? Yes 07/12/20 ED visit- Sinusitis  2.  Any changes in your medicines or health? Yes 07/12/20 ED started short course of Amoxicillin   PCP : Pleas Koch, NP  Allergies:   Allergies  Allergen Reactions   Atorvastatin     Other reaction(s): Headache   Other Other (See Comments)    STATIN DRUG-CAUSED MUSCLE PAIN/LETHARGIC    Medications: Outpatient Encounter Medications as of 07/23/2020  Medication Sig   albuterol (PROVENTIL HFA;VENTOLIN HFA) 108 (90 Base) MCG/ACT inhaler Inhale 2 puffs into the lungs every 4 (four) hours as needed for wheezing or shortness of breath.   amLODipine (NORVASC) 5 MG tablet Take 5 mg by mouth daily.   aspirin EC 81 MG tablet Take 81 mg by mouth daily.   Continuous Blood Gluc Sensor (FREESTYLE LIBRE 14 DAY SENSOR) MISC SMARTSIG:1 Each Topical Every 2 Weeks   fenofibrate (TRICOR) 145 MG tablet Take 145 mg by mouth daily.   hydrochlorothiazide (HYDRODIURIL) 50 MG tablet Take 1 tablet (50 mg total) by mouth daily.   insulin degludec (TRESIBA) 200 UNIT/ML FlexTouch Pen Inject 70 Units into the skin daily.    levothyroxine (SYNTHROID) 75 MCG tablet Take 1 tablet by mouth every morning on an empty stomach with water only.  No food or other medications for 30 minutes.   metFORMIN (GLUCOPHAGE) 1000 MG tablet TAKE 1 TABLET BY MOUTH TWICE A DAY   NOVOLOG FLEXPEN 100 UNIT/ML FlexPen Inject 28 units before breakfast, inject 24 units beforelunch, inject 34 units before supper. Increase when blood sugar is over 150. Use 2 units more for every 50 over.   ramipril (ALTACE) 10 MG capsule TAKE 1 CAPSULE BY MOUTH 2 TIMES DAILY   rosuvastatin (CRESTOR) 10 MG tablet Take 1 tablet (10 mg total) by mouth daily. For cholesterol.   TRUEPLUS 5-BEVEL PEN  NEEDLES 31G X 6 MM MISC    No facility-administered encounter medications on file as of 07/23/2020.    Current Diagnosis: Patient Active Problem List   Diagnosis Date Noted   Nevus 01/08/2020   Welcome to Medicare preventive visit 09/25/2017   Personal history of tobacco use, presenting hazards to health 06/30/2016   Preventative health care 06/05/2016   Osteoarthritis of both knees 11/09/2015   Diabetes mellitus ([place]) 06/14/2015   Allergic rhinitis 05/28/2015   Morbid obesity with BMI of 40.0-44.9, adult ([place]) 05/28/2015   Chronic constipation 05/28/2015   Chronic obstructive pulmonary disease ([place]) 05/28/2015   Essential (primary) hypertension 05/28/2015   Hypercholesterolemia 05/28/2015   Gastro-esophageal reflux disease without esophagitis 05/28/2015   Hypothyroidism 05/28/2015   Dermatofibroma of left lower leg 04/01/2015   Former smoker 03/04/2015   History of colonic polyps 08/20/2014   Idiopathic localized osteoarthropathy 03/16/2014   Recent Relevant Labs: Lab Results  Component Value Date/Time   HGBA1C 7.5 (H) 06/01/2016 07:50 AM   HGBA1C 8.7 11/26/2015 08:55 AM   HGBA1C 8.0 08/27/2015 09:58 AM   MICROALBUR 1.1 06/01/2016 07:50 AM    Kidney Function Lab Results  Component Value Date/Time   CREATININE 1.07 06/01/2016 07:50 AM   CREATININE 1.05 02/19/2015 09:23 AM   GFR 74.02 06/01/2016 07:50 AM   GFRNONAA 76 02/19/2015 09:23 AM   GFRAA 88 02/19/2015 09:23 AM    Current antihyperglycemic regimen:  Tyler Aas  200 unit/ml 70 units into the skin daily Metformin 1000 mg twice daily Novolog 100 unit/ml 28 units before breakfast/24 units before lunch/34 units before supper. Increase when blood sugar is over 150. Use 2 units more for every 50 over.  Freestyle Libre CGM Trueplus pen needles  What recent interventions/DTPs have been made to improve glycemic control:  Goal of 7.5% for A1c.  Have there been any recent hospitalizations or ED visits since last visit with  CPP? Yes for sinusitis  Patient denies hypoglycemic symptoms, including Pale, Sweaty, Shaky, Hungry, Nervous/irritable and Vision changes   Patient denies hyperglycemic symptoms, including blurry vision, excessive thirst, fatigue, polyuria and weakness   How often are you checking your blood sugar? 3-4 times daily   What are your blood sugars ranging?  Fasting:  07/14/20 191 07/15/20 180 07/16/20 207 07/17/20 90 07/18/20 140 07/19/20 150 07/20/20 179 07/21/20 173 07/22/20 180 07/23/20 154 Before meals:  07/14/20 121 07/15/20 212 07/16/20 203 07/17/20 149 07/18/20 137 07/19/20 174 07/20/20 209 07/21/20 254 07/22/20 180 After meals: No readings Bedtime:  07/14/20 197 07/15/20 138 07/16/20 201 07/17/20 244 07/18/20 120 07/19/20 157 07/20/20 173 07/21/20 153 07/22/20 203  During the week, how often does your blood glucose drop below 70? Never   Are you checking your feet daily/regularly? Yes, states feet look good.   Adherence Review: Is the patient currently on a STATIN medication? Yes Is the patient currently on ACE/ARB medication? Yes Does the patient have >5 day gap between last estimated fill dates? CPP to review.    Follow-Up:  Pharmacist Review   States his wife currently has covid but she is doing ok. States he is not happy with [employer]. Sensors are not lasting the full 14 days. Sensors alert that they are not working. He called [employer] and they sent him another one. It was working until about 6-7 day of use it stopped working again. He called again and [employer] was going to send voucher via E-Mail and he has not yet received it. States he had sinusitis and has had high blood sugars since then. Patient notes he is placing the sensors on the "back of his arm between his elbow and shoulder, in the fatty part". He states activity has been limited due to knee pain. He has been watching his diet and is eating a lot of salads. He has had some french fries "here and there".   Debbora Dus, CPP  notified  Margaretmary Dys, [place] Pharmacy Assistant [phone]  I have reviewed the care management and care coordination activities outlined in this encounter and I am certifying that I agree with the content of this note. Reviewed meds for sinusitis, amoxicillin course, no steroids. BG may be elevated due to illness. Will continue to monitor. He is applying Freestyle Sensors in the correct location. Will have CMA follow up with patient in 2 weeks to see if sensor issue resolved and BG improving. Remind patient to apply to back of arm only after wiping with alcohol wipe or cleaning with soap and water and allowing time for arm to dry completely. He may need to shave the site to help with adhesion. If still not resolved, please schedule f/u with me to review.  Debbora Dus, PharmD Clinical Pharmacist [facility] [phone]

## 2020-07-27 ENCOUNTER — Other Ambulatory Visit: Payer: HMO

## 2020-07-27 LAB — NOVEL CORONAVIRUS, NAA: SARS-CoV-2, NAA: NOT DETECTED

## 2020-08-13 DIAGNOSIS — R0902 Hypoxemia: Secondary | ICD-10-CM | POA: Diagnosis not present

## 2020-08-13 DIAGNOSIS — G4733 Obstructive sleep apnea (adult) (pediatric): Secondary | ICD-10-CM | POA: Diagnosis not present

## 2020-08-13 DIAGNOSIS — J449 Chronic obstructive pulmonary disease, unspecified: Secondary | ICD-10-CM | POA: Diagnosis not present

## 2020-08-23 ENCOUNTER — Other Ambulatory Visit: Payer: Self-pay | Admitting: Primary Care

## 2020-08-23 DIAGNOSIS — E038 Other specified hypothyroidism: Secondary | ICD-10-CM

## 2020-08-23 NOTE — Telephone Encounter (Signed)
It looks like he has a lab appointment scheduled for this week for repeat thyroid testing, does he have enough levothyroxine to last throughout this week?

## 2020-08-24 NOTE — Telephone Encounter (Signed)
Left message to return call to our office.  

## 2020-08-24 NOTE — Telephone Encounter (Signed)
Called back has plenty to last until next week. Will hold off on refill until after labs.

## 2020-08-26 ENCOUNTER — Other Ambulatory Visit (INDEPENDENT_AMBULATORY_CARE_PROVIDER_SITE_OTHER): Payer: HMO

## 2020-08-26 ENCOUNTER — Other Ambulatory Visit: Payer: Self-pay

## 2020-08-26 DIAGNOSIS — E039 Hypothyroidism, unspecified: Secondary | ICD-10-CM

## 2020-08-26 DIAGNOSIS — E7800 Pure hypercholesterolemia, unspecified: Secondary | ICD-10-CM

## 2020-08-26 DIAGNOSIS — I2510 Atherosclerotic heart disease of native coronary artery without angina pectoris: Secondary | ICD-10-CM

## 2020-08-26 LAB — LIPID PANEL
Cholesterol: 89 mg/dL (ref 0–200)
HDL: 27.8 mg/dL — ABNORMAL LOW (ref >39.00)
LDL Cholesterol: 22 mg/dL (ref 0–99)
NonHDL: 60.78
Total CHOL/HDL Ratio: 3
Triglycerides: 192 mg/dL — ABNORMAL HIGH (ref 0.0–149.0)
VLDL: 38.4 mg/dL (ref 0.0–40.0)

## 2020-08-26 LAB — TSH: TSH: 5.92 u[IU]/mL — ABNORMAL HIGH (ref 0.35–4.50)

## 2020-08-27 ENCOUNTER — Other Ambulatory Visit: Payer: Self-pay

## 2020-08-27 ENCOUNTER — Other Ambulatory Visit: Payer: Self-pay | Admitting: Primary Care

## 2020-08-27 DIAGNOSIS — E7800 Pure hypercholesterolemia, unspecified: Secondary | ICD-10-CM

## 2020-08-27 DIAGNOSIS — E039 Hypothyroidism, unspecified: Secondary | ICD-10-CM

## 2020-08-27 MED ORDER — LEVOTHYROXINE SODIUM 100 MCG PO TABS: ORAL_TABLET | ORAL | 0 refills | Status: DC

## 2020-09-07 ENCOUNTER — Ambulatory Visit: Payer: HMO | Admitting: *Deleted

## 2020-09-07 DIAGNOSIS — E1142 Type 2 diabetes mellitus with diabetic polyneuropathy: Secondary | ICD-10-CM | POA: Diagnosis not present

## 2020-09-07 LAB — BASIC METABOLIC PANEL
BUN: 18 (ref 4–21)
CO2: 30 — AB (ref 13–22)
Chloride: 101 (ref 99–108)
Creatinine: 1.1 (ref 0.6–1.3)
Potassium: 4.3 (ref 3.4–5.3)
Sodium: 137 (ref 137–147)

## 2020-09-07 LAB — HEMOGLOBIN A1C: Hemoglobin A1C: 7.6

## 2020-09-07 LAB — COMPREHENSIVE METABOLIC PANEL: Calcium: 9.4 (ref 8.7–10.7)

## 2020-09-08 ENCOUNTER — Telehealth: Payer: Self-pay

## 2020-09-08 NOTE — Telephone Encounter (Signed)
Patient due for 3 year f/u colonoscopy. Message left to see if he wants a referral to [facility] or if he wants to return to Dr Bary Castilla at [facility].

## 2020-09-09 ENCOUNTER — Encounter: Payer: Self-pay | Admitting: Pulmonary Disease

## 2020-09-09 ENCOUNTER — Other Ambulatory Visit: Payer: Self-pay

## 2020-09-09 ENCOUNTER — Ambulatory Visit: Payer: HMO | Admitting: Pulmonary Disease

## 2020-09-09 VITALS — BP 140/72 | HR 93 | Temp 97.5°F | Ht 70.0 in | Wt 313.2 lb

## 2020-09-09 DIAGNOSIS — J9611 Chronic respiratory failure with hypoxia: Secondary | ICD-10-CM

## 2020-09-09 DIAGNOSIS — J432 Centrilobular emphysema: Secondary | ICD-10-CM | POA: Diagnosis not present

## 2020-09-09 DIAGNOSIS — Z87891 Personal history of nicotine dependence: Secondary | ICD-10-CM

## 2020-09-09 DIAGNOSIS — G4733 Obstructive sleep apnea (adult) (pediatric): Secondary | ICD-10-CM

## 2020-09-09 DIAGNOSIS — Z9989 Dependence on other enabling machines and devices: Secondary | ICD-10-CM

## 2020-09-09 NOTE — Progress Notes (Signed)
[facility]  Chief Complaint  Patient presents with  . Follow-up    Patient is sleeping good and machine is working good.     Constitutional:  BP 140/72 (BP Location: Right Arm, Patient Position: Sitting, Cuff Size: Large)   Pulse 93   Temp (!) 97.5 F (36.4 C) (Temporal)   Ht 5\' 10"  (1.778 m)   Wt (!) 313 lb 3.2 oz (142.1 kg)   SpO2 95%   BMI 44.94 kg/m   Past Medical History:  OA, Colon polyp, DM type 2, HLD, HTN, Hypothyroidism, Statin myopathy  Past Surgical History:  He  has a past surgical history that includes Tonsillectomy; Colonoscopy (2008, 2016); Lipoma excision (N/A, 03/26/2015); DERMATOFIBROMA (03/26/15); and Colonoscopy with propofol (N/A, 10/03/2017).  Brief Summary:  [name] is a 70 y.o. male former smoker with obstructive sleep apnea, COPD with emphysema, and chronic hypoxic respiratory failure.      Subjective:   Uses CPAP with oxygen.  No issues with mask fit.  Feels rested.  Not having cough, wheeze, or sputum.  Hasn't needed to use albuterol  Had follow up low dose CT and findings stable.  Labs from 09/07/20 reviewed.  HCO3 30 - in normal range.  Physical Exam:   Appearance - well kempt  ENMT - no sinus tenderness, no oral exudate, no LAN, Mallampati 4 airway, no stridor, wears dentures  Respiratory - equal breath sounds bilaterally, no wheezing or rales  CV - s1s2 regular rate and rhythm, no murmurs  Ext - no clubbing, no edema  Skin - no rashes  Psych - normal mood and affect   Pulmonary testing:   PFT 01/08/18 >> FEV1 2.19 (64%), FEV1% 69, TLC 5.11 (78%), DLCO 44%, no BD  Chest Imaging:   CT chest lung cancer screening 07/02/19 >> atherosclerosis, mild/mod centrilobular and paraseptal emphysema, b/l nodules up to 7 mm in LUL, fatty liver  CT chest lung cancer screening 07/04/20 >> no change  Sleep Tests:   HST 11/14/17 >> AHI 50, SpO2 low 71%  Auto CPAP 08/09/20 to 09/07/20 >> used  on 28 of 30 nights with average 6 hrs 38 min.  Average AHI 2.4 with median CPAP 11 and 95 th percentile CPAP 14 cm H2O.  Some air leak.  Social History:  He  reports that he quit smoking about 5 years ago. His smoking use included cigarettes and e-cigarettes. He has a 70.00 pack-year smoking history. He has never used smokeless tobacco. He reports that he does not drink alcohol and does not use drugs.  Family History:  His family history includes Diabetes in his maternal grandmother, mother, paternal grandmother, and another family member; Other (age of onset: 56) in his daughter; Stroke in his father and mother.     Assessment/Plan:   Obstructive sleep apnea. - he is compliant with CPAP and reports benefit - he uses [employer] for his DME - continue auto CPAP 10 to 20 cm H2O  COPD with emphysema. - stable - continue prn albuterol  Chronic respiratory failure with hypoxia. - uses 1.5 liters at night with CPAP  History of tobacco abuse. - he will have f/u low dose CT chest in December 2022 through [facility] lung cancer screening program  Time Spent Involved in Patient Care on Day of Examination:  22 minutes  Follow up:  Patient Instructions  Follow up in 1 year   Medication List:   Allergies as of 09/09/2020  Reactions   Atorvastatin    Other reaction(s): Headache   Other Other (See Comments)   STATIN DRUG-CAUSED MUSCLE PAIN/LETHARGIC      Medication List       Accurate as of September 09, 2020 12:30 PM. If you have any questions, ask your nurse or doctor.        albuterol 108 (90 Base) MCG/ACT inhaler Commonly known as: VENTOLIN HFA Inhale 2 puffs into the lungs every 4 (four) hours as needed for wheezing or shortness of breath.   amLODipine 5 MG tablet Commonly known as: NORVASC Take 10 mg by mouth daily.   aspirin EC 81 MG tablet Take 81 mg by mouth daily.   fenofibrate 145 MG tablet Commonly known as: TRICOR Take 145 mg by mouth daily.    FreeStyle Libre 14 Day Sensor Misc SMARTSIG:1 Each Topical Every 2 Weeks   hydrochlorothiazide 50 MG tablet Commonly known as: HYDRODIURIL Take 1 tablet (50 mg total) by mouth daily.   insulin degludec 200 UNIT/ML FlexTouch Pen Commonly known as: TRESIBA Inject 70 Units into the skin daily.   levothyroxine 100 MCG tablet Commonly known as: SYNTHROID Take 1 tablet by mouth every morning on an empty stomach with water only.  No food or other medications for 30 minutes.   metFORMIN 1000 MG tablet Commonly known as: GLUCOPHAGE TAKE 1 TABLET BY MOUTH TWICE A DAY   NovoLOG FlexPen 100 UNIT/ML FlexPen Generic drug: insulin aspart Inject 28 units before breakfast, inject 24 units beforelunch, inject 34 units before supper. Increase when blood sugar is over 150. Use 2 units more for every 50 over.   ramipril 10 MG capsule Commonly known as: ALTACE TAKE 1 CAPSULE BY MOUTH 2 TIMES DAILY   rosuvastatin 10 MG tablet Commonly known as: Crestor Take 1 tablet (10 mg total) by mouth daily. For cholesterol.   TRUEplus 5-Bevel Pen Needles 31G X 6 MM Misc Generic drug: Insulin Pen Needle       Signature:  Chesley Mires, MD [place] Pager - [phone] 09/09/2020, 12:30 PM

## 2020-09-09 NOTE — Patient Instructions (Signed)
Follow up in 1 year.

## 2020-09-13 ENCOUNTER — Ambulatory Visit: Payer: HMO | Admitting: Podiatry

## 2020-09-13 ENCOUNTER — Encounter: Payer: Self-pay | Admitting: Podiatry

## 2020-09-13 ENCOUNTER — Other Ambulatory Visit: Payer: Self-pay

## 2020-09-13 DIAGNOSIS — B351 Tinea unguium: Secondary | ICD-10-CM

## 2020-09-13 DIAGNOSIS — Z794 Long term (current) use of insulin: Secondary | ICD-10-CM

## 2020-09-13 DIAGNOSIS — M79674 Pain in right toe(s): Secondary | ICD-10-CM

## 2020-09-13 DIAGNOSIS — M79675 Pain in left toe(s): Secondary | ICD-10-CM

## 2020-09-13 DIAGNOSIS — M2041 Other hammer toe(s) (acquired), right foot: Secondary | ICD-10-CM

## 2020-09-13 DIAGNOSIS — M2042 Other hammer toe(s) (acquired), left foot: Secondary | ICD-10-CM

## 2020-09-13 DIAGNOSIS — E119 Type 2 diabetes mellitus without complications: Secondary | ICD-10-CM | POA: Diagnosis not present

## 2020-09-13 DIAGNOSIS — L84 Corns and callosities: Secondary | ICD-10-CM

## 2020-09-13 NOTE — Progress Notes (Signed)
Subjective: [name] presents today preventative diabetic foot care and callus(es) right foot and painful mycotic toenails b/l that are difficult to trim. Pain interferes with ambulation. Aggravating factors include wearing enclosed shoe gear. Pain is relieved with periodic professional debridement.   He voices no new pedal concerns on today's visit.  Juan Koch, NP is patient's PCP. Last visit was 04/26/2020.  Allergies  Allergen Reactions   Atorvastatin     Other reaction(s): Headache   Other Other (See Comments)    STATIN DRUG-CAUSED MUSCLE PAIN/LETHARGIC    Objective: [name] is a pleasant 70 y.o. Caucasian male, WD, WN in NAD. AAO x 3.   There were no vitals filed for this visit.  Vascular Examination: Neurovascular status unchanged b/l. Capillary fill time to digits <3 seconds b/l. Palpable PT pulses b/l. Faintly palpable DP pulses b/l. Pedal hair sparse b/l. Skin temperature gradient within normal limits b/l. No edema noted b/l.  Dermatological Examination: Pedal skin with normal turgor, texture and tone bilaterally. No open wounds bilaterally. No interdigital macerations bilaterally. Toenails 1-5 b/l elongated, discolored, dystrophic, thickened, crumbly with subungual debris and tenderness to dorsal palpation. Incurvated nailplate lateral border(s) L hallux.  Nail border hypertrophy minimal. There is tenderness to palpation. Sign(s) of infection: no clinical signs of infection noted on examination today.. Hyperkeratotic lesion(s) submet head 3 right foot and submet head 5 right foot.  No erythema, no edema, no drainage, no fluctuance.  Musculoskeletal: Normal muscle strength 5/5 to all lower extremity muscle groups bilaterally. No pain crepitus or joint limitation noted with ROM b/l. Hammertoes noted to the 1-5 bilaterally.  Neurological Examination: Protective sensation intact 5/5 intact bilaterally with 10g monofilament b/l. Vibratory sensation intact b/l.  Proprioception intact bilaterally.  Assessment: 1. Pain due to onychomycosis of toenails of both feet   2. Callus   3. Acquired hammertoes of both feet   4. Type 2 diabetes mellitus without complication, with long-term current use of insulin ([place])    Plan: -Examined patient. -No new findings. No new orders. -Continue diabetic foot care principles. -Toenails 1-5 b/l were debrided in length and girth with sterile nail nippers and dremel without iatrogenic bleeding.  -Callus(es) submet head 3 right foot and submet head 5 right foot pared utilizing sterile scalpel blade without complication or incident. Total number debrided =2. -Patient to report any pedal injuries to medical professional immediately.  Return in about 3 months (around 12/11/2020).  Marzetta Board, DPM

## 2020-09-14 ENCOUNTER — Ambulatory Visit
Admission: EM | Admit: 2020-09-14 | Discharge: 2020-09-14 | Disposition: A | Discharge status: Home / Self Care | Payer: HMO | Attending: Family Medicine | Admitting: Family Medicine

## 2020-09-14 ENCOUNTER — Encounter: Payer: Self-pay | Admitting: Family Medicine

## 2020-09-14 ENCOUNTER — Other Ambulatory Visit: Payer: Self-pay

## 2020-09-14 ENCOUNTER — Telehealth: Payer: Self-pay

## 2020-09-14 DIAGNOSIS — B349 Viral infection, unspecified: Secondary | ICD-10-CM

## 2020-09-14 DIAGNOSIS — Z7689 Persons encountering health services in other specified circumstances: Secondary | ICD-10-CM | POA: Diagnosis not present

## 2020-09-14 DIAGNOSIS — E1142 Type 2 diabetes mellitus with diabetic polyneuropathy: Secondary | ICD-10-CM | POA: Diagnosis not present

## 2020-09-14 NOTE — Telephone Encounter (Signed)
See note below access nurse note; pt going to [employer]. Sending note to Gentry Fitz NP.

## 2020-09-14 NOTE — ED Triage Notes (Signed)
Pt c/o productive cough with green sputum, ear pressure, congestion, runny nose onset yesterday.  Denies sore throat, fever, SOB.  Took diabetic tussin, coricidin D, tylenol last night. Took home covid test (negative) this morning.

## 2020-09-14 NOTE — Telephone Encounter (Signed)
Access nurse spoke with pt and advised to go to UC for eval. Pt does not want to go to UC. On 09/13/20 started with sinus drainage; when blows nose mucus is green and prod cough with green phlegm; S/T but does not think has fever. No available appts at [facility] and pt is going to [facility] in [place] for eval and possible testing.Sending note to Gentry Fitz NP as Juluis Rainier.

## 2020-09-14 NOTE — Telephone Encounter (Signed)
[facility] - Client TELEPHONE ADVICE RECORD [facility] Patient Name: [name] Gender: Male DOB: March 02, 1951 Age: 70 Y 11 M 22 D Return Phone Number: [phone] (Primary), [phone] (Secondary) Address: [place] [address] Client [facility] [place] - Client Client Site [place] - Day Physician [name] - NP Contact Type Call Who Is Calling Patient / Member / Family / Caregiver Call Type Triage / Clinical Caller Name [name] Relationship To Patient Self Return Phone Number [phone] (Primary) Chief Complaint Sore Throat Reason for Call Symptomatic / Request for [name] has a sinus infection and would like to know what to do. His voice is hoarse, post nasal drip, sore throat, and coughing up green phlegm. Translation No Nurse Assessment Nurse: Juan Evert, RN, [name] Date/Time (Eastern Time): 09/14/2020 9:26:21 AM Confirm and document reason for call. If symptomatic, describe symptoms. ---Caller states he has a sore throat and sinus drainage and a hoarse voice that started last night. No fever Does the patient have any new or worsening symptoms? ---Yes Will a triage be completed? ---Yes Related visit to physician within the last 2 weeks? ---No Does the PT have any chronic conditions? (i.e. diabetes, asthma, this includes High risk factors for pregnancy, etc.) ---Yes List chronic conditions. ---diabetes, hypertension Is this a behavioral health or substance abuse call? ---No Guidelines Guideline Title Affirmed Question Affirmed Notes Nurse Date/Time (Eastern Time) Sore Throat Diabetes mellitus or weak immune system (e.g., HIV positive, cancer chemo, splenectomy, organ transplant, chronic steroids) Juan Evert, RN, [name] 09/14/2020 9:27:20 AM Disp. Time [name] Time) Disposition Final User 09/14/2020 9:31:30 AM See PCP within 24 Hours  Yes Juan Evert, RN, [name] PLEASE NOTE: All timestamps contained within this report are represented as [place] Standard Time. CONFIDENTIALTY NOTICE: This fax transmission is intended only for the addressee. It contains information that is legally privileged, confidential or otherwise protected from use or disclosure. If you are not the intended recipient, you are strictly prohibited from reviewing, disclosing, copying using or disseminating any of this information or taking any action in reliance on or regarding this information. If you have received this fax in error, please notify us immediately by telephone so that we can arrange for its return to [place]. Phone: [phone], Toll-Free: [phone], Fax: [phone] Page: 2 of 2 Call Id: [id number] [place] Disagree/Comply Comply Caller Understands Yes PreDisposition Did not know what to do Care Advice Given Per Guideline SEE PCP WITHIN 24 HOURS: CALL BACK IF: * You become worse CARE ADVICE given per Sore Throat (Adult) guideline. Referrals REFERRED TO PCP OFFICE

## 2020-09-14 NOTE — Telephone Encounter (Signed)
Noted  

## 2020-09-14 NOTE — Discharge Instructions (Signed)
This is most likely some sort of virus.  Keep taking the OTC medicines as needed.  Follow up as needed for continued or worsening symptoms

## 2020-09-14 NOTE — ED Provider Notes (Addendum)
[name]    CSN: [id number] Arrival date & time: 09/14/20  1208      History   Chief Complaint Chief Complaint  Patient presents with  . Cough    HPI [name] is a 70 y.o. male.   Pt is a 70 year old male productive cough with green sputum, ear pressure, congestion, runny nose onset yesterday. Denies sore throat, fever, SOB. Took diabetic tussin, coricidin D, tylenol last night. Took home covid test (negative) this morning. Takes zyrtec daily for allergies. Was around 70 year old that was sick.      Past Medical History:  Diagnosis Date  . Arthritis    knee  . Colon polyp 2008  . COPD with emphysema ([place])   . Diabetes mellitus   . Hypercholesteremia   . Hypertension   . Hypothyroidism   . Observed sleep apnea   . Statin myopathy     Patient Active Problem List   Diagnosis Date Noted  . Nevus 01/08/2020  . Welcome to Medicare preventive visit 09/25/2017  . Personal history of tobacco use, presenting hazards to health 06/30/2016  . Preventative health care 06/05/2016  . Osteoarthritis of both knees 11/09/2015  . Diabetes mellitus ([place]) 06/14/2015  . Allergic rhinitis 05/28/2015  . Morbid obesity with BMI of 40.0-44.9, adult ([place]) 05/28/2015  . Chronic constipation 05/28/2015  . Chronic obstructive pulmonary disease ([place]) 05/28/2015  . Essential (primary) hypertension 05/28/2015  . Hypercholesterolemia 05/28/2015  . Gastro-esophageal reflux disease without esophagitis 05/28/2015  . Hypothyroidism 05/28/2015  . Dermatofibroma of left lower leg 04/01/2015  . Former smoker 03/04/2015  . History of colonic polyps 08/20/2014  . Idiopathic localized osteoarthropathy 03/16/2014    Past Surgical History:  Procedure Laterality Date  . COLONOSCOPY  2008, 2016   Dr Bary Castilla  . COLONOSCOPY WITH PROPOFOL N/A 10/03/2017   Procedure: COLONOSCOPY WITH PROPOFOL;  Surgeon: Robert Bellow, MD;  Location: [facility] ENDOSCOPY;  Service: Endoscopy;  Laterality:  N/A;  . DERMATOFIBROMA  03/26/15   Procedure: Excision mass left leg;  Surgeon: Robert Bellow, MD;  Location: [facility] ORS;  Service: General;  Laterality: N/A;  . LIPOMA EXCISION N/A 03/26/2015      . TONSILLECTOMY         Home Medications    Prior to Admission medications   Medication Sig Start Date End Date Taking? Authorizing Provider  amLODipine (NORVASC) 5 MG tablet Take 10 mg by mouth daily. 08/12/19  Yes [provider]  aspirin EC 81 MG tablet Take 81 mg by mouth daily.   Yes [provider]  fenofibrate (TRICOR) 145 MG tablet Take 145 mg by mouth daily. 09/19/17  Yes [provider]  hydrochlorothiazide (HYDRODIURIL) 50 MG tablet Take 1 tablet (50 mg total) by mouth daily. 08/21/17  Yes Pleas Koch, NP  insulin degludec (TRESIBA) 200 UNIT/ML FlexTouch Pen Inject 70 Units into the skin daily.    Yes [provider]  levothyroxine (SYNTHROID) 100 MCG tablet Take 1 tablet by mouth every morning on an empty stomach with water only.  No food or other medications for 30 minutes. 08/27/20  Yes Pleas Koch, NP  metFORMIN (GLUCOPHAGE) 1000 MG tablet TAKE 1 TABLET BY MOUTH TWICE A DAY 03/13/16  Yes Chauvin, Robert, PA  NOVOLOG FLEXPEN 100 UNIT/ML FlexPen Inject 28 units before breakfast, inject 24 units beforelunch, inject 34 units before supper. Increase when blood sugar is over 150. Use 2 units more for every 50 over. 04/04/17  Yes [provider]  ramipril (ALTACE) 10 MG capsule TAKE 1 CAPSULE BY MOUTH 2 TIMES DAILY 11/06/17  Yes Pleas Koch, NP  albuterol (PROVENTIL HFA;VENTOLIN HFA) 108 (90 Base) MCG/ACT inhaler Inhale 2 puffs into the lungs every 4 (four) hours as needed for wheezing or shortness of breath. 07/25/18   Laverle Hobby, MD  Continuous Blood Gluc Sensor (FREESTYLE LIBRE 14 DAY SENSOR) MISC SMARTSIG:1 Each Topical Every 2 Weeks 10/31/19   [provider]  rosuvastatin (CRESTOR) 10 MG tablet Take 1 tablet (10  mg total) by mouth daily. For cholesterol. 07/08/20   Pleas Koch, NP  TRUEPLUS 5-BEVEL PEN NEEDLES 31G X 6 MM MISC  10/20/19   [provider]    Family History Family History  Problem Relation Age of Onset  . Stroke Mother   . Diabetes Mother   . Stroke Father   . Diabetes Other   . Other Daughter 25       precancerous colon polyp   . Diabetes Maternal Grandmother   . Diabetes Paternal Grandmother     Social History Social History   Tobacco Use  . Smoking status: Former Smoker    Packs/day: 2.00    Years: 35.00    Pack years: 70.00    Types: Cigarettes, E-cigarettes    Quit date: 12/11/2014    Years since quitting: 5.7  . Smokeless tobacco: Never Used  Vaping Use  . Vaping Use: Never used  Substance Use Topics  . Alcohol use: No    Alcohol/week: 0.0 standard drinks  . Drug use: No     Allergies   Atorvastatin and Other   Review of Systems Review of Systems   Physical Exam Triage Vital Signs ED Triage Vitals  Enc Vitals Group     BP 09/14/20 1222 (!) 147/66     Pulse Rate 09/14/20 1222 91     Resp 09/14/20 1222 (!) 22     Temp 09/14/20 1222 99.3 F (37.4 C)     Temp Source 09/14/20 1222 Oral     SpO2 09/14/20 1222 96 %     Weight --      Height --      Head Circumference --      Peak Flow --      Pain Score 09/14/20 1244 0     Pain Loc --      Pain Edu? --      Excl. in [place]? --    No data found.  Updated Vital Signs BP (!) 147/66 (BP Location: Right Arm)   Pulse 91   Temp 99.3 F (37.4 C) (Oral)   Resp (!) 22   SpO2 96%   Visual Acuity Right Eye Distance:   Left Eye Distance:   Bilateral Distance:    Right Eye Near:   Left Eye Near:    Bilateral Near:     Physical Exam Vitals and nursing note reviewed.  Constitutional:      General: He is not in acute distress.    Appearance: Normal appearance. He is not ill-appearing, toxic-appearing or diaphoretic.  HENT:     Head: Normocephalic and atraumatic.     Right Ear:  Tympanic membrane and ear canal normal.     Left Ear: Tympanic membrane and ear canal normal.     Nose: Congestion present.     Mouth/Throat:     Pharynx: Oropharynx is clear.  Eyes:     Conjunctiva/sclera: Conjunctivae normal.  Cardiovascular:  Rate and Rhythm: Normal rate and regular rhythm.  Pulmonary:     Effort: Pulmonary effort is normal.     Breath sounds: Normal breath sounds.  Musculoskeletal:        General: Normal range of motion.     Cervical back: Normal range of motion.  Skin:    General: Skin is warm and dry.  Neurological:     Mental Status: He is alert.  Psychiatric:        Mood and Affect: Mood normal.      UC Treatments / Results  Labs (all labs ordered are listed, but only abnormal results are displayed) Labs Reviewed  NOVEL CORONAVIRUS, NAA    EKG   Radiology No results found.  Procedures Procedures (including critical care time)  Medications Ordered in UC Medications - No data to display  Initial Impression / Assessment and Plan / UC Course  I have reviewed the triage vital signs and the nursing notes.  Pertinent labs & imaging results that were available during my care of the patient were reviewed by me and considered in my medical decision making (see chart for details).     Viral illness Nothing concerning on exam Supportive care.  Follow up as needed for continued or worsening symptoms  Final Clinical Impressions(s) / UC Diagnoses   Final diagnoses:  Viral illness     Discharge Instructions     This is most likely some sort of virus.  Keep taking the OTC medicines as needed.  Follow up as needed for continued or worsening symptoms     ED Prescriptions    None     PDMP not reviewed this encounter.   Orvan July, NP 09/14/20 1249    Loura Halt A, NP 09/14/20 1250

## 2020-09-16 ENCOUNTER — Encounter: Payer: Self-pay | Admitting: Primary Care

## 2020-09-16 ENCOUNTER — Other Ambulatory Visit: Payer: Self-pay

## 2020-09-16 ENCOUNTER — Telehealth (INDEPENDENT_AMBULATORY_CARE_PROVIDER_SITE_OTHER): Payer: HMO | Admitting: Primary Care

## 2020-09-16 DIAGNOSIS — J309 Allergic rhinitis, unspecified: Secondary | ICD-10-CM | POA: Diagnosis not present

## 2020-09-16 LAB — SARS-COV-2, NAA 2 DAY TAT

## 2020-09-16 LAB — NOVEL CORONAVIRUS, NAA: SARS-CoV-2, NAA: NOT DETECTED

## 2020-09-16 MED ORDER — LEVOCETIRIZINE DIHYDROCHLORIDE 5 MG PO TABS: 5.0000 mg | ORAL_TABLET | Freq: Every evening | ORAL | 0 refills | Status: DC

## 2020-09-16 MED ORDER — FLUTICASONE PROPIONATE 50 MCG/ACT NA SUSP: 1.0000 | Freq: Two times a day (BID) | NASAL | 0 refills | Status: AC

## 2020-09-16 NOTE — Patient Instructions (Signed)
Nasal Congestion/Ear Pressure/Sinus Pressure: Try using Flonase (fluticasone) nasal spray. Instill 1 spray in each nostril twice daily.   Stop taking Zyrtec, start levocetirizine (Xyzal) 5 mg once daily for allergies.  Please update me Tuesday next week if your symptoms are worse  It was a pleasure to see you today! Allie Bossier, NP-C

## 2020-09-16 NOTE — Assessment & Plan Note (Signed)
3 days of allergy-like symptoms, no improvement with Zyrtec.  Tested negative for Covid twice this week.  Wife has no symptoms.  He is fully vaccinated.  Will send Flonase nasal spray to pharmacy along with Xyzal 5 mg.  Discussed to stop Zyrtec.  He will update early next week, if symptoms persist consider Amoxil course.  At this point his symptoms represent allergy involvement.

## 2020-09-16 NOTE — Progress Notes (Signed)
Subjective:    Patient ID: [name], male    DOB: 09-14-1950, 70 y.o.   MRN: [id number]  HPI  This visit occurred during the SARS-CoV-2 public health emergency.  Safety protocols were in place, including screening questions prior to the visit, additional usage of staff PPE, and extensive cleaning of exam room while observing appropriate contact time as indicated for disinfecting solutions.   Virtual Visit via Video Note  I connected with [name] on 09/16/20 at  9:00 AM EST by a video enabled telemedicine application and verified that I am speaking with the correct person using two identifiers.  Location: Patient: Home Provider: Office Participants:    I discussed the limitations of evaluation and management by telemedicine and the availability of in person appointments. The patient expressed understanding and agreed to proceed.  History of Present Illness:  [name] is a 70 year old male with a history of type 2 diabetes, hypothyroidism, hyperlipidemia, hypertension, COPD, prior tobacco abuse who presents today with a chief complaint of nasal congestion.   Ear/eye congestion, sinus pressure, post nasal drip. Symptoms began three days ago. He's taking Zyrtec for the last year which isn't helpful with his acute symptoms.  He endorses being treated with amoxicillin in December 2021 with resolving symptoms.  He's tested negative for Covid-19 twice this week, once at Urgent Care two days ago and a second time with a home test.   Observations/Objective:  Alert and oriented. Appears well, not sickly. No distress. Speaking in complete sentences. No cough, sounds congested.  Assessment and Plan:  3 days of allergy-like symptoms, no improvement with Zyrtec.  Tested negative for Covid twice this week.  Wife has no symptoms.  He is fully vaccinated.  Will send Flonase nasal spray to pharmacy along with Xyzal 5 mg.  Discussed to stop Zyrtec.  He will update early next week,  if symptoms persist consider Amoxil course.  At this point his symptoms represent allergy involvement.  Follow Up Instructions:  Nasal Congestion/Ear Pressure/Sinus Pressure: Try using Flonase (fluticasone) nasal spray. Instill 1 spray in each nostril twice daily.   Stop taking Zyrtec, start levocetirizine (Xyzal) 5 mg once daily for allergies.  Please update me Tuesday next week if your symptoms are worse  It was a pleasure to see you today! Allie Bossier, NP-C    I discussed the assessment and treatment plan with the patient. The patient was provided an opportunity to ask questions and all were answered. The patient agreed with the plan and demonstrated an understanding of the instructions.   The patient was advised to call back or seek an in-person evaluation if the symptoms worsen or if the condition fails to improve as anticipated.    Pleas Koch, NP    Review of Systems  Constitutional: Negative for fever.  HENT: Positive for congestion, postnasal drip and sinus pressure. Negative for sore throat.   Respiratory: Negative for cough.        Past Medical History:  Diagnosis Date  . Arthritis    knee  . Colon polyp 2008  . COPD with emphysema ([place])   . Diabetes mellitus   . Hypercholesteremia   . Hypertension   . Hypothyroidism   . Observed sleep apnea   . Statin myopathy      Social History   Socioeconomic History  . Marital status: Married    Spouse name: Not on file  . Number of children: Not on file  . Years of education: Not  on file  . Highest education level: Not on file  Occupational History  . Not on file  Tobacco Use  . Smoking status: Former Smoker    Packs/day: 2.00    Years: 35.00    Pack years: 70.00    Types: Cigarettes, E-cigarettes    Quit date: 12/11/2014    Years since quitting: 5.7  . Smokeless tobacco: Never Used  Vaping Use  . Vaping Use: Never used  Substance and Sexual Activity  . Alcohol use: No    Alcohol/week: 0.0  standard drinks  . Drug use: No  . Sexual activity: Not on file  Other Topics Concern  . Not on file  Social History Narrative   Married.   3 children. 2 grandchildren.   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    Social Determinants of Health   Financial Resource Strain: Not on file  Food Insecurity: No Food Insecurity  . Worried About Charity fundraiser in the Last Year: Never true  . Ran Out of Food in the Last Year: Never true  Transportation Needs: No Transportation Needs  . Lack of Transportation (Medical): No  . Lack of Transportation (Non-Medical): No  Physical Activity: Insufficiently Active  . Days of Exercise per Week: 3 days  . Minutes of Exercise per Session: 10 min  Stress: Not on file  Social Connections: Not on file  Intimate Partner Violence: Not on file    Past Surgical History:  Procedure Laterality Date  . COLONOSCOPY  2008, 2016   Dr Bary Castilla  . COLONOSCOPY WITH PROPOFOL N/A 10/03/2017   Procedure: COLONOSCOPY WITH PROPOFOL;  Surgeon: Robert Bellow, MD;  Location: [facility] ENDOSCOPY;  Service: Endoscopy;  Laterality: N/A;  . DERMATOFIBROMA  03/26/15   Procedure: Excision mass left leg;  Surgeon: Robert Bellow, MD;  Location: [facility] ORS;  Service: General;  Laterality: N/A;  . LIPOMA EXCISION N/A 03/26/2015      . TONSILLECTOMY      Family History  Problem Relation Age of Onset  . Stroke Mother   . Diabetes Mother   . Stroke Father   . Diabetes Other   . Other Daughter 25       precancerous colon polyp   . Diabetes Maternal Grandmother   . Diabetes Paternal Grandmother     Allergies  Allergen Reactions  . Atorvastatin     Other reaction(s): Headache  . Other Other (See Comments)    STATIN DRUG-CAUSED MUSCLE PAIN/LETHARGIC    Current Outpatient Medications on File Prior to Visit  Medication Sig Dispense Refill  . albuterol (PROVENTIL HFA;VENTOLIN HFA) 108 (90 Base) MCG/ACT inhaler Inhale  2 puffs into the lungs every 4 (four) hours as needed for wheezing or shortness of breath. 1 Inhaler 0  . amLODipine (NORVASC) 5 MG tablet Take 10 mg by mouth daily.    Marland Kitchen aspirin EC 81 MG tablet Take 81 mg by mouth daily.    . Continuous Blood Gluc Sensor (FREESTYLE LIBRE 14 DAY SENSOR) MISC SMARTSIG:1 Each Topical Every 2 Weeks    . fenofibrate (TRICOR) 145 MG tablet Take 145 mg by mouth daily.    . hydrochlorothiazide (HYDRODIURIL) 50 MG tablet Take 1 tablet (50 mg total) by mouth daily. 90 tablet 1  . insulin degludec (TRESIBA) 200 UNIT/ML FlexTouch Pen Inject 70 Units into the skin daily.     Marland Kitchen levothyroxine (SYNTHROID) 100 MCG tablet Take 1 tablet by mouth  every morning on an empty stomach with water only.  No food or other medications for 30 minutes. 90 tablet 0  . metFORMIN (GLUCOPHAGE) 1000 MG tablet TAKE 1 TABLET BY MOUTH TWICE A DAY 180 tablet 1  . NOVOLOG FLEXPEN 100 UNIT/ML FlexPen Inject 28 units before breakfast, inject 24 units beforelunch, inject 34 units before supper. Increase when blood sugar is over 150. Use 2 units more for every 50 over.  9  . ramipril (ALTACE) 10 MG capsule TAKE 1 CAPSULE BY MOUTH 2 TIMES DAILY 180 capsule 1  . rosuvastatin (CRESTOR) 10 MG tablet Take 1 tablet (10 mg total) by mouth daily. For cholesterol. 90 tablet 3  . TRUEPLUS 5-BEVEL PEN NEEDLES 31G X 6 MM MISC      No current facility-administered medications on file prior to visit.    Ht 5\' 10"  (1.778 m)   Wt (!) 313 lb (142 kg)   BMI 44.91 kg/m    Objective:   Physical Exam Constitutional:      General: He is not in acute distress.    Appearance: He is not ill-appearing.  Pulmonary:     Effort: Pulmonary effort is normal.     Comments: No cough during exam Neurological:     Mental Status: He is alert and oriented to person, place, and time.            Assessment & Plan:

## 2020-09-24 DIAGNOSIS — J449 Chronic obstructive pulmonary disease, unspecified: Secondary | ICD-10-CM | POA: Diagnosis not present

## 2020-09-24 DIAGNOSIS — G4733 Obstructive sleep apnea (adult) (pediatric): Secondary | ICD-10-CM | POA: Diagnosis not present

## 2020-09-26 DIAGNOSIS — J019 Acute sinusitis, unspecified: Secondary | ICD-10-CM

## 2020-09-28 ENCOUNTER — Other Ambulatory Visit: Payer: Self-pay | Admitting: General Surgery

## 2020-09-28 DIAGNOSIS — Z86010 Personal history of colonic polyps: Secondary | ICD-10-CM | POA: Diagnosis not present

## 2020-09-28 MED ORDER — AMOXICILLIN-POT CLAVULANATE 875-125 MG PO TABS: 1.0000 | ORAL_TABLET | Freq: Two times a day (BID) | ORAL | 0 refills | Status: DC

## 2020-09-28 NOTE — Progress Notes (Signed)
Subjective:     Patient ID: [name] is a 70 y.o. male.  HPI  The following portions of the patient's history were reviewed and updated as appropriate.  This an established patient is here today for: office visit. He is here to discuss having a colonoscopy, history of colon polyps. He states his bowels move daily, no bleeding.  Last colonoscopy was 2019.   Review of Systems  Constitutional: Negative for chills and fever.  Respiratory: Negative for cough.        Chief Complaint  Patient presents with  . Follow-up     BP (!) 152/68   Pulse 93   Temp 36.5 C (97.7 F)   Ht 177.8 cm ('5\' 10"' )   Wt (!) 139.7 kg (308 lb)   SpO2 98%   BMI 44.19 kg/m   Past Medical History:  Diagnosis Date  . Adenomatous polyp   . Aortic atherosclerosis (CMS-HCC) 06/2020  . Arthritis   . Chronic obstructive lung disease (CMS-HCC)   . Emphysema lung (CMS-HCC) 06/2020  . Hyperlipidemia   . Hypertension   . Hypothyroidism   . Sleep apnea   . Type 2 diabetes mellitus (CMS-HCC)           Past Surgical History:  Procedure Laterality Date  . COLONOSCOPY  09/2015  . COLONOSCOPY  2008  . GROWTH ON ANKLE REMOVED  03/26/2015   lipoma  . TONSILLECTOMY         Social History          Socioeconomic History  . Marital status: Married    Spouse name: Not on file  . Number of children: Not on file  . Years of education: Not on file  . Highest education level: Not on file  Occupational History  . Not on file  Tobacco Use  . Smoking status: Former Smoker    Packs/day: 2.00    Years: 35.00    Pack years: 70.00    Types: Cigarettes    Quit date: 11/15/2014    Years since quitting: 5.8  . Smokeless tobacco: Never Used  Substance and Sexual Activity  . Alcohol use: No  . Drug use: Not on file  . Sexual activity: Not on file  Other Topics Concern  . Not on file  Social History Narrative  . Not on file   Social Determinants of Health    Financial Resource Strain: Not on file  Food Insecurity: Not on file  Transportation Needs: Not on file            Allergies  Allergen Reactions  . Other Other (See Comments)    STATIN DRUG-CAUSED MUSCLE PAIN/LETHARGIC    Current Medications        Current Outpatient Medications  Medication Sig Dispense Refill  . albuterol 90 mcg/actuation inhaler Inhale 2 inhalations into the lungs    . amLODIPine (NORVASC) 5 MG tablet TAKE 1 TABLET BY MOUTH ONCE A DAY 30 tablet 5  . aspirin 81 MG EC tablet Take 81 mg by mouth once daily.    . blood glucose diagnostic (ONETOUCH ULTRA BLUE TEST STRIP) test strip Use 4 (four) times daily 400 each 3  . fenofibrate nanocrystallized (TRICOR) 145 MG tablet TAKE 1 TABLET BY MOUTH ONCE DAILY 90 tablet 3  . flash glucose sensor (FREESTYLE LIBRE 2 SENSOR) kit Use 1 kit every 14 (fourteen) days for glucose monitoring 2 kit 11  . hydroCHLOROthiazide (HYDRODIURIL) 50 MG tablet TAKE 1 TABLET BY MOUTH ONCE A DAY 90  tablet 3  . insulin ASPART (NOVOLOG FLEXPEN U-100 INSULIN) pen injector (concentration 100 units/mL) INJECT UP TO 100 UNITS UNDER THE SKIN ONCE DAILY IN DIVIDED DOSES, AS DIRECTED (Patient taking differently: 28/24/34 INJECT UP TO 100 UNITS UNDER THE SKIN ONCE DAILY IN DIVIDED DOSES, AS DIRECTED  ) 30 mL 5  . insulin DEGLUDEC (TRESIBA FLEXTOUCH U-200) pen injector (concentration 200 units/mL) Inject 70 Units subcutaneously nightly 36 mL 11  . levothyroxine (SYNTHROID, LEVOTHROID) 25 MCG tablet Take 100 mcg by mouth once daily Take on an empty stomach with a glass of water at least 30-60 minutes before breakfast.       . metFORMIN (GLUCOPHAGE) 1000 MG tablet TAKE 1 TABLET BY MOUTH TWICE (2) DAILY 180 tablet 3  . pen needle, diabetic 31 gauge x 3/16" needle Use as directed To inject insulin 4 times daily 400 each 3  . ramipriL (ALTACE) 10 MG capsule TAKE 1 CAPSULE BY MOUTH 2 TIMES DAILY 180 capsule 3  . rosuvastatin (CRESTOR) 10 MG tablet  Take by mouth once daily     No current facility-administered medications for this visit.           Family History  Problem Relation Age of Onset  . Diabetes Mother   . High blood pressure (Hypertension) Mother   . High blood pressure (Hypertension) Father   . Stroke Father   . Diabetes Maternal Grandmother   . High blood pressure (Hypertension) Maternal Grandfather   . Diabetes Paternal Grandmother   . High blood pressure (Hypertension) Paternal Grandfather   . Stroke Paternal Grandfather   . No Known Problems Daughter   . Back Pain Son   . No Known Problems Son         Objective:   Physical Exam Constitutional:      Appearance: Normal appearance.  Cardiovascular:     Rate and Rhythm: Normal rate and regular rhythm.     Pulses: Normal pulses.     Heart sounds: Normal heart sounds.  Pulmonary:     Effort: Pulmonary effort is normal.     Breath sounds: Normal breath sounds.  Musculoskeletal:     Cervical back: Neck supple.  Skin:    General: Skin is warm and dry.  Neurological:     Mental Status: He is alert and oriented to person, place, and time.  Psychiatric:        Mood and Affect: Mood normal.        Behavior: Behavior normal.     Labs and Radiology:   October 03, 2017 colonoscopy:  DIAGNOSIS:  A. COLON POLYP 2, TRANSVERSE; HOT SNARE:  - TUBULAR ADENOMAS (3).  - NEGATIVE FOR HIGH-GRADE DYSPLASIA AND MALIGNANCY.   B. COLON POLYP 2, DISTAL TRANSVERSE; COLD BIOPSY:  - TUBULAR ADENOMAS (2).  - NEGATIVE FOR HIGH-GRADE DYSPLASIA AND MALIGNANCY.   C. RECTUM POLYP 2; COLD BIOPSY:  - NO PATHOLOGIC CHANGE.   September 09, 2014 colonoscopy:  DIAGNOSIS:  A. COLON POLYP, CECUM; COLD BIOPSY:  - TUBULAR ADENOMA.  - NEGATIVE FOR HIGH-GRADE DYSPLASIA AND MALIGNANCY.   B. COLON POLYP, TRANSVERSE; COLD BIOPSY:  - SESSILE SERRATED ADENOMA/POLYP, MULTIPLE FRAGMENTS.  - NEGATIVE FOR CYTOLOGIC DYSPLASIA AND MALIGNANCY.  - CLINICAL  CORRELATION IS ADVISED WITH REGARD TO COMPLETENESS OF  REMOVAL.   C. RECTUM POLYP; HOT SNARE:  - TUBULAR ADENOMA, 2 FRAGMENTS.  - NEGATIVE FOR HIGH-GRADE DYSPLASIA AND MALIGNANCY.   September 07, 2020 laboratory:    Component Ref Range & Units 3 wk ago  Glucose 70 - 110 mg/dL 237High   Sodium 136 - 145 mmol/L 137   Potassium 3.6 - 5.1 mmol/L 4.3   Chloride 97 - 109 mmol/L 101   Carbon Dioxide (CO2) 22.0 - 32.0 mmol/L 30.0   Calcium 8.7 - 10.3 mg/dL 9.4   Urea Nitrogen (BUN) 7 - 25 mg/dL 18   Creatinine 0.7 - 1.3 mg/dL 1.1   Glomerular Filtration Rate (eGFR), MDRD Estimate >60 mL/min/1.73sq m 67   BUN/Crea Ratio 6.0 - 20.0 16.4   Anion Gap w/K 6.0 - 16.0 10.3       Component Ref Range & Units 3 wk ago   Hemoglobin A1C 4.2 - 5.6 % 7.6High   Average Blood Glucose (Calc) mg/dL 171             Assessment:     Candidate for repeat colonoscopy.    Plan:     Patient was instructed in regards to preparation by the staff.  He will hold his Metformin the day of prep and procedure.    Entered by Karie Fetch, RN, acting as a scribe for Dr. Hervey Ard, MD.  The documentation recorded by the scribe accurately reflects the service I personally performed and the decisions made by me.   Robert Bellow, MD FACS

## 2020-10-06 ENCOUNTER — Other Ambulatory Visit
Admission: RE | Admit: 2020-10-06 | Discharge: 2020-10-06 | Disposition: A | Discharge status: Home / Self Care | Payer: HMO | Source: Ambulatory Visit | Attending: General Surgery | Admitting: General Surgery

## 2020-10-06 ENCOUNTER — Other Ambulatory Visit: Payer: Self-pay

## 2020-10-06 DIAGNOSIS — Z20822 Contact with and (suspected) exposure to covid-19: Secondary | ICD-10-CM | POA: Diagnosis not present

## 2020-10-06 DIAGNOSIS — Z01812 Encounter for preprocedural laboratory examination: Secondary | ICD-10-CM | POA: Diagnosis not present

## 2020-10-06 LAB — SARS CORONAVIRUS 2 (TAT 6-24 HRS): SARS Coronavirus 2: NEGATIVE

## 2020-10-07 ENCOUNTER — Encounter: Payer: Self-pay | Admitting: General Surgery

## 2020-10-07 ENCOUNTER — Encounter: Payer: Self-pay | Admitting: Primary Care

## 2020-10-08 ENCOUNTER — Ambulatory Visit: Payer: HMO | Admitting: Anesthesiology

## 2020-10-08 ENCOUNTER — Encounter: Payer: Self-pay | Admitting: General Surgery

## 2020-10-08 ENCOUNTER — Ambulatory Visit
Admission: RE | Admit: 2020-10-08 | Discharge: 2020-10-08 | Disposition: A | Discharge status: Home / Self Care | Payer: HMO | Attending: General Surgery | Admitting: General Surgery

## 2020-10-08 ENCOUNTER — Encounter
Admission: RE | Disposition: A | Discharge status: Home / Self Care | Payer: Self-pay | Source: Home / Self Care | Attending: General Surgery

## 2020-10-08 ENCOUNTER — Other Ambulatory Visit: Payer: Self-pay

## 2020-10-08 DIAGNOSIS — D123 Benign neoplasm of transverse colon: Secondary | ICD-10-CM | POA: Insufficient documentation

## 2020-10-08 DIAGNOSIS — Z79899 Other long term (current) drug therapy: Secondary | ICD-10-CM | POA: Diagnosis not present

## 2020-10-08 DIAGNOSIS — I10 Essential (primary) hypertension: Secondary | ICD-10-CM | POA: Diagnosis not present

## 2020-10-08 DIAGNOSIS — Z86010 Personal history of colonic polyps: Secondary | ICD-10-CM | POA: Insufficient documentation

## 2020-10-08 DIAGNOSIS — K219 Gastro-esophageal reflux disease without esophagitis: Secondary | ICD-10-CM | POA: Diagnosis not present

## 2020-10-08 DIAGNOSIS — Z87891 Personal history of nicotine dependence: Secondary | ICD-10-CM | POA: Insufficient documentation

## 2020-10-08 DIAGNOSIS — Z79890 Hormone replacement therapy (postmenopausal): Secondary | ICD-10-CM | POA: Diagnosis not present

## 2020-10-08 DIAGNOSIS — D120 Benign neoplasm of cecum: Secondary | ICD-10-CM | POA: Diagnosis not present

## 2020-10-08 DIAGNOSIS — D124 Benign neoplasm of descending colon: Secondary | ICD-10-CM | POA: Insufficient documentation

## 2020-10-08 DIAGNOSIS — Z7984 Long term (current) use of oral hypoglycemic drugs: Secondary | ICD-10-CM | POA: Diagnosis not present

## 2020-10-08 DIAGNOSIS — J449 Chronic obstructive pulmonary disease, unspecified: Secondary | ICD-10-CM | POA: Diagnosis not present

## 2020-10-08 DIAGNOSIS — Z7689 Persons encountering health services in other specified circumstances: Secondary | ICD-10-CM | POA: Diagnosis not present

## 2020-10-08 DIAGNOSIS — K635 Polyp of colon: Secondary | ICD-10-CM | POA: Diagnosis not present

## 2020-10-08 DIAGNOSIS — Z1211 Encounter for screening for malignant neoplasm of colon: Secondary | ICD-10-CM | POA: Diagnosis not present

## 2020-10-08 DIAGNOSIS — Z794 Long term (current) use of insulin: Secondary | ICD-10-CM | POA: Diagnosis not present

## 2020-10-08 DIAGNOSIS — Z7982 Long term (current) use of aspirin: Secondary | ICD-10-CM | POA: Diagnosis not present

## 2020-10-08 DIAGNOSIS — Z888 Allergy status to other drugs, medicaments and biological substances status: Secondary | ICD-10-CM | POA: Diagnosis not present

## 2020-10-08 DIAGNOSIS — E039 Hypothyroidism, unspecified: Secondary | ICD-10-CM | POA: Diagnosis not present

## 2020-10-08 HISTORY — DX: Chronic obstructive pulmonary disease, unspecified: J44.9

## 2020-10-08 HISTORY — PX: COLONOSCOPY WITH PROPOFOL: SHX5780

## 2020-10-08 LAB — GLUCOSE, CAPILLARY: Glucose-Capillary: 164 mg/dL — ABNORMAL HIGH (ref 70–99)

## 2020-10-08 LAB — SURGICAL PATHOLOGY

## 2020-10-08 SURGERY — COLONOSCOPY WITH PROPOFOL
Anesthesia: General

## 2020-10-08 MED ORDER — PROPOFOL 10 MG/ML IV BOLUS
INTRAVENOUS | Status: DC | PRN
  Administered 2020-10-08: 90 mg via INTRAVENOUS

## 2020-10-08 MED ORDER — SODIUM CHLORIDE 0.9 % IV SOLN
INTRAVENOUS | Status: DC
  Administered 2020-10-08: 20 mL/h via INTRAVENOUS

## 2020-10-08 MED ORDER — PROPOFOL 500 MG/50ML IV EMUL
INTRAVENOUS | Status: DC | PRN
  Administered 2020-10-08: 180 ug/kg/min via INTRAVENOUS

## 2020-10-08 NOTE — Anesthesia Postprocedure Evaluation (Signed)
Anesthesia Post Note  Patient: [name]  Procedure(s) Performed: COLONOSCOPY WITH PROPOFOL (N/A )  Patient location during evaluation: Endoscopy Anesthesia Type: General Level of consciousness: awake and alert Pain management: pain level controlled Vital Signs Assessment: post-procedure vital signs reviewed and stable Respiratory status: spontaneous breathing, nonlabored ventilation, respiratory function stable and patient connected to nasal cannula oxygen Cardiovascular status: blood pressure returned to baseline and stable Postop Assessment: no apparent nausea or vomiting Anesthetic complications: no   No complications documented.   Last Vitals:  Vitals:   10/08/20 0920 10/08/20 0930  BP: (!) 139/50 (!) 130/53  Pulse: 85 84  Resp: 12 12  Temp:    SpO2: 100% 98%    Last Pain:  Vitals:   10/08/20 0930  TempSrc:   PainSc: 0-No pain                 Martha Clan

## 2020-10-08 NOTE — H&P (Signed)
AUNDREA HIGGINBOTHAM [id number] [birth year]     HPI: Patient with multiple tubular adenomas in 2019, [place] in 2016.  For follow up exam. Tolerated prep well.    Medications Prior to Admission  Medication Sig Dispense Refill Last Dose  . albuterol (PROVENTIL HFA;VENTOLIN HFA) 108 (90 Base) MCG/ACT inhaler Inhale 2 puffs into the lungs every 4 (four) hours as needed for wheezing or shortness of breath. 1 Inhaler 0 Past Week at Unknown time  . amLODipine (NORVASC) 5 MG tablet Take 10 mg by mouth daily.   Past Week at Unknown time  . amoxicillin-clavulanate (AUGMENTIN) 875-125 MG tablet Take 1 tablet by mouth 2 (two) times daily. 14 tablet 0 Past Week at Unknown time  . aspirin EC 81 MG tablet Take 81 mg by mouth daily.   10/07/2020 at [place]  . Continuous Blood Gluc Sensor (FREESTYLE LIBRE 14 DAY SENSOR) MISC SMARTSIG:1 Each Topical Every 2 Weeks   Past Week at Unknown time  . fenofibrate (TRICOR) 145 MG tablet Take 145 mg by mouth daily.   Past Week at Unknown time  . fluticasone (FLONASE) 50 MCG/ACT nasal spray Place 1 spray into both nostrils 2 (two) times daily. 16 g 0 Past Week at Unknown time  . hydrochlorothiazide (HYDRODIURIL) 50 MG tablet Take 1 tablet (50 mg total) by mouth daily. 90 tablet 1 10/08/2020 at 0715  . insulin degludec (TRESIBA) 200 UNIT/ML FlexTouch Pen Inject 70 Units into the skin daily.    Past Week at Unknown time  . levocetirizine (XYZAL) 5 MG tablet Take 1 tablet (5 mg total) by mouth every evening. For allergies. 90 tablet 0 Past Week at Unknown time  . levothyroxine (SYNTHROID) 100 MCG tablet Take 1 tablet by mouth every morning on an empty stomach with water only.  No food or other medications for 30 minutes. 90 tablet 0 Past Week at Unknown time  . metFORMIN (GLUCOPHAGE) 1000 MG tablet TAKE 1 TABLET BY MOUTH TWICE A DAY 180 tablet 1 Past Week at Unknown time  . NOVOLOG FLEXPEN 100 UNIT/ML FlexPen Inject 28 units before breakfast, inject 24 units beforelunch, inject 34 units before  supper. Increase when blood sugar is over 150. Use 2 units more for every 50 over.  9 Past Week at Unknown time  . ramipril (ALTACE) 10 MG capsule TAKE 1 CAPSULE BY MOUTH 2 TIMES DAILY 180 capsule 1 10/08/2020 at 0715  . rosuvastatin (CRESTOR) 10 MG tablet Take 1 tablet (10 mg total) by mouth daily. For cholesterol. 90 tablet 3 Past Week at Unknown time  . TRUEPLUS 5-BEVEL PEN NEEDLES 31G X 6 MM MISC    Past Week at Unknown time   Allergies  Allergen Reactions  . Atorvastatin     Other reaction(s): Headache  . Other Other (See Comments)    STATIN DRUG-CAUSED MUSCLE PAIN/LETHARGIC   Past Medical History:  Diagnosis Date  . Adenomatous polyp   . Aortic atherosclerosis ([place]) 06/16/2020  . Arthritis    knee  . Chronic obstructive lung disease ([place])   . Colon polyp 2008  . COPD with emphysema ([place])   . Diabetes mellitus   . Emphysema lung ([place]) 06/16/2020  . Emphysema lung ([place])   . Hypercholesteremia   . Hyperlipemia   . Hypertension   . Hypothyroidism   . Observed sleep apnea   . Sleep apnea   . Statin myopathy    Past Surgical History:  Procedure Laterality Date  . COLONOSCOPY  2008, 2016   Dr Bary Castilla  .  COLONOSCOPY WITH PROPOFOL N/A 10/03/2017   Procedure: COLONOSCOPY WITH PROPOFOL;  Surgeon: Robert Bellow, MD;  Location: [facility] ENDOSCOPY;  Service: Endoscopy;  Laterality: N/A;  . DERMATOFIBROMA  03/26/15   Procedure: Excision mass left leg;  Surgeon: Robert Bellow, MD;  Location: [facility] ORS;  Service: General;  Laterality: N/A;  . LIPOMA EXCISION N/A 03/26/2015      . TONSILLECTOMY     Social History   Socioeconomic History  . Marital status: Married    Spouse name: Not on file  . Number of children: Not on file  . Years of education: Not on file  . Highest education level: Not on file  Occupational History  . Not on file  Tobacco Use  . Smoking status: Former Smoker    Packs/day: 2.00    Years: 35.00    Pack years: 70.00    Types: Cigarettes, E-cigarettes     Quit date: 12/11/2014    Years since quitting: 5.8  . Smokeless tobacco: Never Used  Vaping Use  . Vaping Use: Never used  Substance and Sexual Activity  . Alcohol use: No    Alcohol/week: 0.0 standard drinks  . Drug use: No  . Sexual activity: Not on file  Other Topics Concern  . Not on file  Social History Narrative   Married.   3 children. 2 grandchildren.   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    Social Determinants of Health   Financial Resource Strain: Not on file  Food Insecurity: No Food Insecurity  . Worried About Charity fundraiser in the Last Year: Never true  . Ran Out of Food in the Last Year: Never true  Transportation Needs: No Transportation Needs  . Lack of Transportation (Medical): No  . Lack of Transportation (Non-Medical): No  Physical Activity: Insufficiently Active  . Days of Exercise per Week: 3 days  . Minutes of Exercise per Session: 10 min  Stress: Not on file  Social Connections: Not on file  Intimate Partner Violence: Not on file   Social History   Social History Narrative   Married.   3 children. 2 grandchildren.   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      ROS: Negative.     PE: HEENT: Negative. Lungs: Clear. Cardio: RR.   Assessment/Plan:  Proceed with planned endoscopy.   Forest Gleason [facility] 10/08/2020

## 2020-10-08 NOTE — Anesthesia Procedure Notes (Signed)
Date/Time: 10/08/2020 8:34 AM Performed by: Nelda Marseille, CRNA Pre-anesthesia Checklist: Patient identified, Emergency Drugs available, Suction available, Patient being monitored and Timeout performed Oxygen Delivery Method: Supernova nasal CPAP

## 2020-10-08 NOTE — Op Note (Signed)
[facility] Patient Name: [name] Procedure Date: 10/08/2020 8:21 AM MRN: [id number] Account #: [id number] Date of Birth: [birth year] Admit Type: Outpatient Age: 70 Room: [facility] ENDO ROOM 1 Gender: Male Note Status: Finalized Procedure:             Colonoscopy Indications:           High risk colon cancer surveillance: Personal history                         of colonic polyps Providers:             Robert Bellow, MD Referring MD:          Pleas Koch (Referring MD) Medicines:             Propofol per Anesthesia Complications:         No immediate complications. Procedure:             Pre-Anesthesia Assessment:                        - Prior to the procedure, a History and Physical was                         performed, and patient medications, allergies and                         sensitivities were reviewed. The patient's tolerance                         of previous anesthesia was reviewed.                        - The risks and benefits of the procedure and the                         sedation options and risks were discussed with the                         patient. All questions were answered and informed                         consent was obtained.                        After obtaining informed consent, the colonoscope was                         passed under direct vision. Throughout the procedure,                         the patient's blood pressure, pulse, and oxygen                         saturations were monitored continuously. The                         Colonoscope was introduced through the anus and                         advanced to the the cecum, identified  by appendiceal                         orifice and ileocecal valve. The colonoscopy was                         performed without difficulty. The patient tolerated                         the procedure well. The quality of the bowel                          preparation was good. Findings:      Three sessile polyps were found in the descending colon, transverse       colon and cecum. The polyps were diminutive in size. These polyps were       removed with a cold snare. Resection and retrieval were complete.      The retroflexed view of the distal rectum and anal verge was normal and       showed no anal or rectal abnormalities. Impression:            - Three diminutive polyps in the descending colon, in                         the transverse colon and in the cecum, removed with a                         cold snare. Resected and retrieved.                        - The distal rectum and anal verge are normal on                         retroflexion view. Recommendation:        - Telephone endoscopist for pathology results in 1                         week. Procedure Code(s):     --- Professional ---                        [phone], Colonoscopy, flexible; with removal of                         tumor(s), polyp(s), or other lesion(s) by snare                         technique Diagnosis Code(s):     --- Professional ---                        Z86.010, Personal history of colonic polyps                        K63.5, Polyp of colon CPT copyright 2019 American [employer] Association. All rights reserved. The codes documented in this report are preliminary and upon coder review may  be revised to meet current compliance requirements. Robert Bellow, MD 10/08/2020 8:57:29 AM This report has been signed electronically. Number of Addenda: 0 Note Initiated On: 10/08/2020 8:21 AM Scope  Withdrawal Time: 0 hours 16 minutes 41 seconds  Total Procedure Duration: 0 hours 24 minutes 7 seconds  Estimated Blood Loss:  Estimated blood loss: none.      [facility]

## 2020-10-08 NOTE — Transfer of Care (Signed)
Immediate Anesthesia Transfer of Care Note  Patient: [name]  Procedure(s) Performed: COLONOSCOPY WITH PROPOFOL (N/A )  Patient Location: PACU  Anesthesia Type:General  Level of Consciousness: sedated  Airway & Oxygen Therapy: Patient Spontanous Breathing and Patient connected to face mask oxygen  Post-op Assessment: Report given to RN and Post -op Vital signs reviewed and stable  Post vital signs: Reviewed and stable  Last Vitals:  Vitals Value Taken Time  BP    Temp    Pulse    Resp    SpO2      Last Pain:  Vitals:   10/08/20 0743  TempSrc: Temporal  PainSc: 0-No pain         Complications: No complications documented.

## 2020-10-08 NOTE — Anesthesia Preprocedure Evaluation (Signed)
Anesthesia Evaluation  Patient identified by MRN, date of birth, ID band Patient awake    Reviewed: Allergy & Precautions, NPO status , Patient's Chart, lab work & pertinent test results  History of Anesthesia Complications Negative for: history of anesthetic complications  Airway Mallampati: III  TM Distance: >3 FB Neck ROM: Full    Dental  (+) Edentulous Upper, Edentulous Lower, Dental Advidsory Given   Pulmonary neg sleep apnea, COPD,  COPD inhaler, former smoker,    breath sounds clear to auscultation- rhonchi (-) wheezing      Cardiovascular hypertension, Pt. on medications (-) CAD, (-) Past MI, (-) Cardiac Stents and (-) CABG  Rhythm:Regular Rate:Normal - Systolic murmurs and - Diastolic murmurs    Neuro/Psych negative neurological ROS  negative psych ROS   GI/Hepatic Neg liver ROS, GERD  ,  Endo/Other  diabetes, Insulin DependentHypothyroidism Morbid obesity  Renal/GU negative Renal ROS     Musculoskeletal  (+) Arthritis ,   Abdominal (+) + obese,   Peds  Hematology negative hematology ROS (+)   Anesthesia Other Findings Past Medical History: No date: Arthritis     Comment:  knee No date: Bronchitis 2008: Colon polyp No date: Diabetes mellitus No date: Dyspnea No date: Hypercholesteremia No date: Hypertension No date: Hypothyroidism   Reproductive/Obstetrics                             Anesthesia Physical  Anesthesia Plan  ASA: III  Anesthesia Plan: General   Post-op Pain Management:    Induction: Intravenous  PONV Risk Score and Plan: 1 and Propofol infusion and TIVA  Airway Management Planned: Natural Airway and Nasal Cannula  Additional Equipment:   Intra-op Plan:   Post-operative Plan:   Informed Consent: I have reviewed the patients History and Physical, chart, labs and discussed the procedure including the risks, benefits and alternatives for the  proposed anesthesia with the patient or authorized representative who has indicated his/her understanding and acceptance.     Dental advisory given  Plan Discussed with: CRNA and Anesthesiologist  Anesthesia Plan Comments:         Anesthesia Quick Evaluation

## 2020-10-10 ENCOUNTER — Encounter: Payer: Self-pay | Admitting: General Surgery

## 2020-10-19 ENCOUNTER — Telehealth: Payer: Self-pay

## 2020-10-19 NOTE — Chronic Care Management (AMB) (Addendum)
Chronic Care Management Pharmacy Assistant   Name: [name]  MRN: [id number] DOB: 1951-03-28   Reason for Encounter: Disease State DM   Conditions to be addressed/monitored: DMII  Recent office visits:  09/16/2020  Alma Friendly NP   Short course Fluticasone and Levocetirizine  Recent consult visits:  09/14/2020  Loura Halt NP Urgent Care 09/28/2020 [facility] visits:  Medication Reconciliation was completed by comparing discharge summary, patient's EMR and Pharmacy list, and upon discussion with patient.  Admitted to the hospital on 10/08/2020 due to  Colonoscopy  Discharge date was 10/08/2020. Discharged from [facility]?Medications Started at [facility] Discharge:??  no new medications identified.  Medication Changes at Hospital Discharge:  no medication changes   Medications that remain the same after Hospital Discharge:??  -All other medications will remain the same.    Medications: Outpatient Encounter Medications as of 10/19/2020  Medication Sig   albuterol (PROVENTIL HFA;VENTOLIN HFA) 108 (90 Base) MCG/ACT inhaler Inhale 2 puffs into the lungs every 4 (four) hours as needed for wheezing or shortness of breath.   amLODipine (NORVASC) 5 MG tablet Take 10 mg by mouth daily.   amoxicillin-clavulanate (AUGMENTIN) 875-125 MG tablet Take 1 tablet by mouth 2 (two) times daily.   aspirin EC 81 MG tablet Take 81 mg by mouth daily.   Continuous Blood Gluc Sensor (FREESTYLE LIBRE 14 DAY SENSOR) MISC SMARTSIG:1 Each Topical Every 2 Weeks   fenofibrate (TRICOR) 145 MG tablet Take 145 mg by mouth daily.   fluticasone (FLONASE) 50 MCG/ACT nasal spray Place 1 spray into both nostrils 2 (two) times daily.   hydrochlorothiazide (HYDRODIURIL) 50 MG tablet Take 1 tablet (50 mg total) by mouth daily.   insulin degludec (TRESIBA) 200 UNIT/ML FlexTouch Pen Inject 70 Units into the skin daily.    levocetirizine (XYZAL) 5 MG tablet  Take 1 tablet (5 mg total) by mouth every evening. For allergies.   levothyroxine (SYNTHROID) 100 MCG tablet Take 1 tablet by mouth every morning on an empty stomach with water only.  No food or other medications for 30 minutes.   metFORMIN (GLUCOPHAGE) 1000 MG tablet TAKE 1 TABLET BY MOUTH TWICE A DAY   NOVOLOG FLEXPEN 100 UNIT/ML FlexPen Inject 28 units before breakfast, inject 24 units beforelunch, inject 34 units before supper. Increase when blood sugar is over 150. Use 2 units more for every 50 over.   ramipril (ALTACE) 10 MG capsule TAKE 1 CAPSULE BY MOUTH 2 TIMES DAILY   rosuvastatin (CRESTOR) 10 MG tablet Take 1 tablet (10 mg total) by mouth daily. For cholesterol.   TRUEPLUS 5-BEVEL PEN NEEDLES 31G X 6 MM MISC    No facility-administered encounter medications on file as of 10/19/2020.   Recent Relevant Labs: Lab Results  Component Value Date/Time   HGBA1C 7.6 09/07/2020 12:00 AM   HGBA1C 7.5 (H) 06/01/2016 07:50 AM   HGBA1C 8.7 11/26/2015 08:55 AM   HGBA1C 8.0 08/27/2015 09:58 AM   MICROALBUR 1.1 06/01/2016 07:50 AM    Kidney Function Lab Results  Component Value Date/Time   CREATININE 1.1 09/07/2020 12:00 AM   CREATININE 1.07 06/01/2016 07:50 AM   CREATININE 1.05 02/19/2015 09:23 AM   GFR 74.02 06/01/2016 07:50 AM   GFRNONAA 76 02/19/2015 09:23 AM   GFRAA 88 02/19/2015 09:23 AM     Current antihyperglycemic regimen:  Tresiba 200 unit/ml 70 units into the skin daily Metformin 1000 mg twice daily  Novolog 100 unit/ml 28 units before breakfast/24 units before lunch/34 units before supper. Increase when blood sugar is over 150. Use 2 units more for every 50 over.  Freestyle Libre CGM Trueplus pen needles    Patient verbally confirms he is taking the above medications as directed. Yes  What recent interventions/DTPs have been made to improve glycemic control:   None identified  Have there been any recent hospitalizations or ED visits since last visit with CPP? Yes Pt.  Had colonoscopy 10/08/2020  Patient denies hypoglycemic symptoms, including Pale, Sweaty, Shaky, Hungry, Nervous/irritable and Vision changes  Patient denies hyperglycemic symptoms, including blurry vision, excessive thirst, fatigue, polyuria and weakness  How often are you checking your blood sugar?  Not frequently, the patient reports he is using the freestyle meter and this is working well at this time.   What are your blood sugars ranging?  Fasting: N/A Before meals: N/A After meals:  Patient reports average  150 Bedtime: N/A  On insulin? Yes How many units:    70 units daily Tresiba,   Novolog 100 unit/ml 28 units before breakfast/24 units before lunch/34 units before supper. Increase when blood sugar is over 150. Use 2 units more for every 50 over.   During the week, how often does your blood glucose drop below 70? Never  Are you checking your feet daily/regularly? Yes  Adherence Review: Is the patient currently on a STATIN medication? Yes Is the patient currently on ACE/ARB medication? Yes Does the patient have >5 day gap between last estimated fill dates? CPP to review  Star Rating Drugs: Medication:  Last Fill: Day Supply Metformin 1000mg  08/27/2020 90ds Ramipril 10 mg. 07/27/2020  90ds Rosuvastatin 10 mg. 09/24/2020 90ds    Follow-Up:  Pharmacist Review  Debbora Dus, CPP notified  [place] Assistant [phone]  I have reviewed the care management and care coordination activities outlined in this encounter and I am certifying that I agree with the content of this note. No further action required.  Debbora Dus, PharmD Clinical Pharmacist [facility] at [facility] [phone]

## 2020-10-25 ENCOUNTER — Other Ambulatory Visit: Payer: HMO

## 2020-10-26 ENCOUNTER — Other Ambulatory Visit: Payer: Self-pay

## 2020-10-26 ENCOUNTER — Encounter: Payer: Self-pay | Admitting: Primary Care

## 2020-10-26 ENCOUNTER — Ambulatory Visit (INDEPENDENT_AMBULATORY_CARE_PROVIDER_SITE_OTHER): Payer: HMO | Admitting: Primary Care

## 2020-10-26 VITALS — BP 135/70 | HR 86 | Temp 98.0°F | Ht 70.0 in | Wt 305.3 lb

## 2020-10-26 DIAGNOSIS — E039 Hypothyroidism, unspecified: Secondary | ICD-10-CM | POA: Diagnosis not present

## 2020-10-26 LAB — TSH: TSH: 3.55 u[IU]/mL (ref 0.35–4.50)

## 2020-10-26 NOTE — Assessment & Plan Note (Signed)
He is taking levothyroxine correctly, compliant to 100 mcg as prescribed.   Repeat TSH function pending. Continue levothyroxine 100 mcg for now.

## 2020-10-26 NOTE — Progress Notes (Signed)
Subjective:    Patient ID: [name], male    DOB: 07/17/1951, 70 y.o.   MRN: [id number]  HPI  [name] is a very pleasant 70 y.o. male with a history of hypertension, COPD, GERD, hypothyroidism, type 2 diabetes, osteoarthritis who presents today for follow up of hypothyroidism.  His levothyroxine was increased to 100 mcg about two months ago due to TSH readings of 5.92 and 5.44 in November 2021 and February 2022.   He is taking levothyroxine every morning on an empty stomach with water only, no food or medications for 30-60 min. He is not taking PPI's, vitamins, magnesium.   BP Readings from Last 3 Encounters:  10/26/20 135/70  10/08/20 (!) 130/53  09/14/20 (!) 147/66   Wt Readings from Last 3 Encounters:  10/26/20 (!) 305 lb 5 oz (138.5 kg)  10/08/20 (!) 302 lb (137 kg)  09/16/20 (!) 313 lb (142 kg)       Review of Systems  Respiratory: Negative for shortness of breath.   Cardiovascular: Negative for chest pain.  Musculoskeletal: Positive for arthralgias.  Neurological: Negative for headaches.         Past Medical History:  Diagnosis Date  . Adenomatous polyp   . Aortic atherosclerosis ([place]) 06/16/2020  . Arthritis    knee  . Chronic obstructive lung disease ([place])   . Colon polyp 2008  . COPD with emphysema ([place])   . Diabetes mellitus   . Emphysema lung ([place]) 06/16/2020  . Emphysema lung ([place])   . Hypercholesteremia   . Hyperlipemia   . Hypertension   . Hypothyroidism   . Observed sleep apnea   . Sleep apnea   . Statin myopathy     Social History   Socioeconomic History  . Marital status: Married    Spouse name: Not on file  . Number of children: Not on file  . Years of education: Not on file  . Highest education level: Not on file  Occupational History  . Not on file  Tobacco Use  . Smoking status: Former Smoker    Packs/day: 2.00    Years: 35.00    Pack years: 70.00    Types: Cigarettes, E-cigarettes    Quit date: 12/11/2014     Years since quitting: 5.8  . Smokeless tobacco: Never Used  Vaping Use  . Vaping Use: Never used  Substance and Sexual Activity  . Alcohol use: No    Alcohol/week: 0.0 standard drinks  . Drug use: No  . Sexual activity: Not on file  Other Topics Concern  . Not on file  Social History Narrative   Married.   3 children. 2 grandchildren.   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    Social Determinants of Health   Financial Resource Strain: Not on file  Food Insecurity: No Food Insecurity  . Worried About Charity fundraiser in the Last Year: Never true  . Ran Out of Food in the Last Year: Never true  Transportation Needs: No Transportation Needs  . Lack of Transportation (Medical): No  . Lack of Transportation (Non-Medical): No  Physical Activity: Insufficiently Active  . Days of Exercise per Week: 3 days  . Minutes of Exercise per Session: 10 min  Stress: Not on file  Social Connections: Not on file  Intimate Partner Violence: Not on file    Past Surgical History:  Procedure Laterality Date  . COLONOSCOPY  2008, 2016   Dr Bary Castilla  . COLONOSCOPY WITH PROPOFOL N/A 10/03/2017   Procedure: COLONOSCOPY WITH PROPOFOL;  Surgeon: Robert Bellow, MD;  Location: [facility] ENDOSCOPY;  Service: Endoscopy;  Laterality: N/A;  . COLONOSCOPY WITH PROPOFOL N/A 10/08/2020   Procedure: COLONOSCOPY WITH PROPOFOL;  Surgeon: Robert Bellow, MD;  Location: [facility] ENDOSCOPY;  Service: Endoscopy;  Laterality: N/A;  . DERMATOFIBROMA  03/26/15   Procedure: Excision mass left leg;  Surgeon: Robert Bellow, MD;  Location: [facility] ORS;  Service: General;  Laterality: N/A;  . LIPOMA EXCISION N/A 03/26/2015      . TONSILLECTOMY      Family History  Problem Relation Age of Onset  . Stroke Mother   . Diabetes Mother   . Stroke Father   . Diabetes Other   . Other Daughter 25       precancerous colon polyp   . Diabetes Maternal Grandmother   . Diabetes  Paternal Grandmother     Allergies  Allergen Reactions  . Atorvastatin     Other reaction(s): Headache  . Other Other (See Comments)    STATIN DRUG-CAUSED MUSCLE PAIN/LETHARGIC    Current Outpatient Medications on File Prior to Visit  Medication Sig Dispense Refill  . albuterol (PROVENTIL HFA;VENTOLIN HFA) 108 (90 Base) MCG/ACT inhaler Inhale 2 puffs into the lungs every 4 (four) hours as needed for wheezing or shortness of breath. 1 Inhaler 0  . amLODipine (NORVASC) 5 MG tablet Take 10 mg by mouth daily.    Marland Kitchen amoxicillin-clavulanate (AUGMENTIN) 875-125 MG tablet Take 1 tablet by mouth 2 (two) times daily. 14 tablet 0  . aspirin EC 81 MG tablet Take 81 mg by mouth daily.    . Continuous Blood Gluc Sensor (FREESTYLE LIBRE 14 DAY SENSOR) MISC SMARTSIG:1 Each Topical Every 2 Weeks    . fenofibrate (TRICOR) 145 MG tablet Take 145 mg by mouth daily.    . fluticasone (FLONASE) 50 MCG/ACT nasal spray Place 1 spray into both nostrils 2 (two) times daily. 16 g 0  . hydrochlorothiazide (HYDRODIURIL) 50 MG tablet Take 1 tablet (50 mg total) by mouth daily. 90 tablet 1  . insulin degludec (TRESIBA) 200 UNIT/ML FlexTouch Pen Inject 70 Units into the skin daily.     Marland Kitchen levocetirizine (XYZAL) 5 MG tablet Take 1 tablet (5 mg total) by mouth every evening. For allergies. 90 tablet 0  . levothyroxine (SYNTHROID) 100 MCG tablet Take 1 tablet by mouth every morning on an empty stomach with water only.  No food or other medications for 30 minutes. 90 tablet 0  . metFORMIN (GLUCOPHAGE) 1000 MG tablet TAKE 1 TABLET BY MOUTH TWICE A DAY 180 tablet 1  . NOVOLOG FLEXPEN 100 UNIT/ML FlexPen Inject 28 units before breakfast, inject 24 units beforelunch, inject 34 units before supper. Increase when blood sugar is over 150. Use 2 units more for every 50 over.  9  . polyethylene glycol powder (GLYCOLAX/MIRALAX) 17 GM/SCOOP powder One bottle for colonoscopy prep. Use as directed.    . ramipril (ALTACE) 10 MG capsule TAKE  1 CAPSULE BY MOUTH 2 TIMES DAILY 180 capsule 1  . rosuvastatin (CRESTOR) 10 MG tablet Take 1 tablet (10 mg total) by mouth daily. For cholesterol. 90 tablet 3  . TRUEPLUS 5-BEVEL PEN NEEDLES 31G X 6 MM MISC      No current facility-administered medications on file prior to visit.    BP 135/70   Pulse 86   Temp 98 F (36.7 C) (Temporal)  Ht 5\' 10"  (1.778 m)   Wt (!) 305 lb 5 oz (138.5 kg)   SpO2 96%   BMI 43.81 kg/m  Objective:   Physical Exam Cardiovascular:     Rate and Rhythm: Normal rate and regular rhythm.  Pulmonary:     Effort: Pulmonary effort is normal.     Breath sounds: Normal breath sounds. No wheezing or rales.  Musculoskeletal:     Cervical back: Neck supple.  Skin:    General: Skin is warm and dry.  Neurological:     Mental Status: He is alert and oriented to person, place, and time.           Assessment & Plan:      This visit occurred during the SARS-CoV-2 public health emergency.  Safety protocols were in place, including screening questions prior to the visit, additional usage of staff PPE, and extensive cleaning of exam room while observing appropriate contact time as indicated for disinfecting solutions.

## 2020-10-26 NOTE — Patient Instructions (Signed)
Stop by the lab prior to leaving today. I will notify you of your results once received.   Be sure to take your levothyroxine (thyroid medication) every morning on an empty stomach with water only. No food or other medications for 30 minutes. No heartburn medication, iron pills, calcium, vitamin D, or magnesium pills within four hours of taking levothyroxine.   It was a pleasure to see you today!  We will see you in October for your annual physical.

## 2020-11-11 DIAGNOSIS — R0902 Hypoxemia: Secondary | ICD-10-CM | POA: Diagnosis not present

## 2020-11-11 DIAGNOSIS — J449 Chronic obstructive pulmonary disease, unspecified: Secondary | ICD-10-CM | POA: Diagnosis not present

## 2020-11-11 DIAGNOSIS — G4733 Obstructive sleep apnea (adult) (pediatric): Secondary | ICD-10-CM | POA: Diagnosis not present

## 2020-11-23 ENCOUNTER — Other Ambulatory Visit: Payer: Self-pay | Admitting: Primary Care

## 2020-11-23 DIAGNOSIS — E039 Hypothyroidism, unspecified: Secondary | ICD-10-CM

## 2020-12-03 ENCOUNTER — Encounter: Payer: Self-pay | Admitting: Family Medicine

## 2020-12-03 ENCOUNTER — Other Ambulatory Visit: Payer: Self-pay

## 2020-12-03 ENCOUNTER — Telehealth (INDEPENDENT_AMBULATORY_CARE_PROVIDER_SITE_OTHER): Payer: HMO | Admitting: Family Medicine

## 2020-12-03 DIAGNOSIS — J0100 Acute maxillary sinusitis, unspecified: Secondary | ICD-10-CM | POA: Diagnosis not present

## 2020-12-03 MED ORDER — AMOXICILLIN-POT CLAVULANATE 875-125 MG PO TABS: 1.0000 | ORAL_TABLET | Freq: Two times a day (BID) | ORAL | 0 refills | Status: DC

## 2020-12-03 NOTE — Progress Notes (Signed)
Patient has had sinus congestion, nasal drip, sore throat and swollen eyes since Tuesday. Patient did take a rapid covid test this am and was negative. He has taken some OTC medication for symptoms.

## 2020-12-03 NOTE — Progress Notes (Signed)
Virtual visit completed through WebEx or similar program Patient location: home  Provider location: [place] at [facility], office  Participants: Patient and me (unless stated otherwise below)  Pandemic considerations d/w pt.   Limitations and rationale for visit method d/w patient.  Patient agreed to proceed.   CC: URI sx.    HPI: covid test neg, done yesterday.   Sx started almost 1 week ago.   Sneezing, coughing, sinus drainage.  Puffy and painful in maxillary area B.   Yolanda Bonine is sick but he doesn't live with patient.  Not SOB, no CP.    Sugar has been ~170 int the AMs recently, lower later in the day.  AM sugars had been higher since he was sick.    Meds and allergies reviewed.   ROS: Per HPI unless specifically indicated in ROS section   NAD Speech wnl  A/P:  Presumed sinus infection.  Okay for outpatient f/u.  Start augmentin.  He agrees with plan.

## 2020-12-05 DIAGNOSIS — J0100 Acute maxillary sinusitis, unspecified: Secondary | ICD-10-CM

## 2020-12-05 HISTORY — DX: Acute maxillary sinusitis, unspecified: J01.00

## 2020-12-05 NOTE — Assessment & Plan Note (Signed)
Presumed sinus infection.  Okay for outpatient f/u.  Start augmentin.  He agrees with plan.  He will update [place] as needed.

## 2020-12-06 ENCOUNTER — Other Ambulatory Visit: Payer: Self-pay | Admitting: Primary Care

## 2020-12-06 DIAGNOSIS — J309 Allergic rhinitis, unspecified: Secondary | ICD-10-CM

## 2020-12-11 DIAGNOSIS — J449 Chronic obstructive pulmonary disease, unspecified: Secondary | ICD-10-CM | POA: Diagnosis not present

## 2020-12-11 DIAGNOSIS — R0902 Hypoxemia: Secondary | ICD-10-CM | POA: Diagnosis not present

## 2020-12-11 DIAGNOSIS — G4733 Obstructive sleep apnea (adult) (pediatric): Secondary | ICD-10-CM | POA: Diagnosis not present

## 2020-12-14 NOTE — Telephone Encounter (Signed)
Dr. Sood, please advise. Thanks 

## 2020-12-16 ENCOUNTER — Telehealth: Payer: Self-pay

## 2020-12-16 NOTE — Chronic Care Management (AMB) (Addendum)
Chronic Care Management Pharmacy Assistant   Name: [name]  MRN: [id number] DOB: Sep 19, 1950  Reason for Encounter: Disease State   Recent office visits:  12/03/20-Dr.Duncan  telemedicine added amoxicillin 875-125 1 tablet 2 times daily  10/26/20-Katherine Clark NP-Labs ordered no medication changes  Recent consult visits:  None since last CCM contact  Hospital visits:  None in previous 6 months  Medications: Outpatient Encounter Medications as of 12/16/2020  Medication Sig   albuterol (PROVENTIL HFA;VENTOLIN HFA) 108 (90 Base) MCG/ACT inhaler Inhale 2 puffs into the lungs every 4 (four) hours as needed for wheezing or shortness of breath.   amLODipine (NORVASC) 5 MG tablet Take 10 mg by mouth daily.   amoxicillin-clavulanate (AUGMENTIN) 875-125 MG tablet Take 1 tablet by mouth 2 (two) times daily.   aspirin EC 81 MG tablet Take 81 mg by mouth daily.   Continuous Blood Gluc Sensor (FREESTYLE LIBRE 14 DAY SENSOR) MISC SMARTSIG:1 Each Topical Every 2 Weeks   fenofibrate (TRICOR) 145 MG tablet Take 145 mg by mouth daily.   fluticasone (FLONASE) 50 MCG/ACT nasal spray Place 1 spray into both nostrils 2 (two) times daily.   hydrochlorothiazide (HYDRODIURIL) 50 MG tablet Take 1 tablet (50 mg total) by mouth daily.   insulin degludec (TRESIBA) 200 UNIT/ML FlexTouch Pen Inject 70 Units into the skin daily.    levocetirizine (XYZAL) 5 MG tablet TAKE 1 TABLET BY MOUTH ONCE EVERY EVENING FOR ALLERGIES   levothyroxine (SYNTHROID) 100 MCG tablet TAKE 1 TABLET BY MOUTH ONCE A DAY. TAKE ON AN EMPTY STOMACH WITH A GLASS OF WATER ATLEAST 30-60 MIN BEFORE BREAKFAST   metFORMIN (GLUCOPHAGE) 1000 MG tablet TAKE 1 TABLET BY MOUTH TWICE A DAY   NOVOLOG FLEXPEN 100 UNIT/ML FlexPen Inject 28 units before breakfast, inject 24 units beforelunch, inject 34 units before supper. Increase when blood sugar is over 150. Use 2 units more for every 50 over.   ramipril (ALTACE) 10 MG capsule TAKE 1 CAPSULE BY  MOUTH 2 TIMES DAILY   rosuvastatin (CRESTOR) 10 MG tablet Take 1 tablet (10 mg total) by mouth daily. For cholesterol.   TRUEPLUS 5-BEVEL PEN NEEDLES 31G X 6 MM MISC    No facility-administered encounter medications on file as of 12/16/2020.    Recent Relevant Labs: Lab Results  Component Value Date/Time   HGBA1C 7.6 09/07/2020 12:00 AM   HGBA1C 7.5 (H) 06/01/2016 07:50 AM   HGBA1C 8.7 11/26/2015 08:55 AM   HGBA1C 8.0 08/27/2015 09:58 AM   MICROALBUR 1.1 06/01/2016 07:50 AM    Kidney Function Lab Results  Component Value Date/Time   CREATININE 1.1 09/07/2020 12:00 AM   CREATININE 1.07 06/01/2016 07:50 AM   CREATININE 1.05 02/19/2015 09:23 AM   GFR 74.02 06/01/2016 07:50 AM   GFRNONAA 76 02/19/2015 09:23 AM   GFRAA 88 02/19/2015 09:23 AM    Current antihyperglycemic regimen:  Tresiba 200 unit/ml 70 units into the skin daily Metformin 1000 mg twice daily              Novolog 100 unit/ml 28 units before breakfast/24 units before lunch/34 units before supper Increase when blood sugar is over 150. Use 2 units more for every 50 over.      Patient verbally confirms he is taking the above medications as directed. Yes  What recent interventions/DTPs have been made to improve glycemic control: Increase when blood sugar is over 150. Use 2 units more for every 50 over.    Have there been any  recent hospitalizations or ED visits since last visit with CPP? No  Patient denies hypoglycemic symptoms, including Pale, Sweaty, Shaky, Hungry, Nervous/irritable and Vision changes  Patient denies hyperglycemic symptoms, including blurry vision, excessive thirst, fatigue, polyuria and weakness  How often are you checking your blood sugar? 3-4 times daily  What are your blood sugars ranging?  Fasting: 12/23/20 -140  12/24/20 - 162  12/25/20 -200  12/26/20 -201 Before meals Lunch 12/23/20 -230  12/24/20 -120  12/25/20 -123 12/26/20- 135 After meals: 12/23/20 -160  12/24/20 -n/a 12/25/20- 151 12/26/20  -161 Bedtime: 12/23/20 - 180  12/24/20 - 220  12/25/20 -194 12/26/20 - 220  On insulin? Yes Tresiba 200 unit/ml 70 units into the skin daily Novolog 100 unit/ml 28 units before breakfast/24 units before lunch/34 units before supper. Increase when blood sugar is over 150. Use 2 units more for every 50 over.  During the week, how often does your blood glucose drop below 70? Never  Are you checking your feet daily/regularly? Yes  Adherence Review: Is the patient currently on a STATIN medication? Yes Is the patient currently on ACE/ARB medication? Yes Does the patient have >5 day gap between last estimated fill dates? No  Star Rating Drugs:  Medication:  Last Fill: Day Supply Metformin 1000mg      11/12/20           90ds Ramipril 10 mg.          10/26/20            90ds Rosuvastatin 10 mg.  09/24/20            90ds novolog 100unit/ml 12/14/20           30ds tresiba 200unit/ml 12/14/20           77ds   Follow-Up:  Pharmacist Review  Debbora Dus, CPP notified  Avel Sensor [facility] Clinical Pharmacy Assistant [phone]  I have reviewed the care management and care coordination activities outlined in this encounter and I am certifying that I agree with the content of this note. No further action required.  Debbora Dus, PharmD Clinical Pharmacist [place] [facility] at [facility] [phone]

## 2020-12-21 ENCOUNTER — Encounter: Payer: Self-pay | Admitting: Podiatry

## 2020-12-21 ENCOUNTER — Ambulatory Visit: Payer: HMO | Admitting: Podiatry

## 2020-12-21 ENCOUNTER — Other Ambulatory Visit: Payer: Self-pay

## 2020-12-21 DIAGNOSIS — M79675 Pain in left toe(s): Secondary | ICD-10-CM

## 2020-12-21 DIAGNOSIS — B351 Tinea unguium: Secondary | ICD-10-CM

## 2020-12-21 DIAGNOSIS — Z794 Long term (current) use of insulin: Secondary | ICD-10-CM

## 2020-12-21 DIAGNOSIS — S90221A Contusion of right lesser toe(s) with damage to nail, initial encounter: Secondary | ICD-10-CM

## 2020-12-21 DIAGNOSIS — M79674 Pain in right toe(s): Secondary | ICD-10-CM

## 2020-12-21 DIAGNOSIS — Q828 Other specified congenital malformations of skin: Secondary | ICD-10-CM | POA: Diagnosis not present

## 2020-12-21 DIAGNOSIS — E119 Type 2 diabetes mellitus without complications: Secondary | ICD-10-CM

## 2020-12-27 NOTE — Progress Notes (Signed)
Subjective:  Patient ID: CORDON GASSETT, male    DOB: 10-Sep-1950,  MRN: [id number]  70 y.o. male presents with preventative diabetic foot care and callus(es) right foot and painful thick toenails that are difficult to trim. Painful toenails interfere with ambulation. Aggravating factors include wearing enclosed shoe gear. Pain is relieved with periodic professional debridement. Painful calluses are aggravated when weightbearing with and without shoegear. Pain is relieved with periodic professional debridement..    Patient's blood sugar was 121 mg/dl today.  Patient states he fell out of bed about one month ago. He notes right 2nd toenail has bruising under it. Denies any redness, drainage or swelling. No pus or digital warmth.  PCP: Pleas Koch, NP and last visit was:   Review of Systems: Negative except as noted in the HPI.   Allergies  Allergen Reactions   Atorvastatin     Other reaction(s): Headache   Other Other (See Comments)    STATIN DRUG-CAUSED MUSCLE PAIN/LETHARGIC    Objective:  There were no vitals filed for this visit. Constitutional Patient is a pleasant 69 y.o. Caucasian male morbidly obese in NAD. AAO x 3.  Vascular Capillary refill time to digits immediate b/l. Palpable PT pulse(s) b/l lower extremities Faintly palpable DP pulse(s) b/l lower extremities. Pedal hair sparse. Lower extremity skin temperature gradient within normal limits. No cyanosis or clubbing noted.  Neurologic Normal speech. Protective sensation intact 5/5 intact bilaterally with 10g monofilament b/l. Vibratory sensation intact b/l.  Dermatologic Pedal skin with normal turgor, texture and tone bilaterally. No open wounds bilaterally. No interdigital macerations bilaterally. Toenails 1-5 left, R hallux, R 3rd toe, R 4th toe, and R 5th toe elongated, discolored, dystrophic, thickened, and crumbly with subungual debris and tenderness to dorsal palpation. There is evidence of acute subungual hematoma of  the L 2nd toe. Nailplate is loose. There is tenderness to palpation. Dark heme noted subungually. Porokeratotic lesion submet head 3 right foot. No erythema, no edema, no drainage, no fluctuance.  Orthopedic: Normal muscle strength 5/5 to all lower extremity muscle groups bilaterally. No pain crepitus or joint limitation noted with ROM b/l. Hammertoe(s) noted to the 1-5 bilaterally.   Hemoglobin A1C 09/07/2020  HGBA1C 7.6  Some recent data might be hidden   Assessment:   1. Pain due to onychomycosis of toenails of both feet   2. Subungual hematoma of second toe of right foot, initial encounter   3. Porokeratosis   4. Type 2 diabetes mellitus without complication, with long-term current use of insulin ([place])    Plan:  Patient was evaluated and treated and all questions answered.  Onychomycosis with pain -Nails palliatively debridement as below. -Educated on self-care  Procedure: Nail Debridement Rationale: Pain Type of Debridement: manual, sharp debridement. Instrumentation: Nail nipper, rotary burr. Number of Nails: 9  -Examined patient. -Discussed need for nail removal due to injury. Patient gave verbal consent. Did not require local block due to looseness of nailplate. Betadine prep performed right 2nd digit. Loose nail plate removed from its remaining attachment to digit. Hematoma evacuated. Nailbed cleansed. Light dressing applied to digit. No further treatment required. -Continue diabetic foot care principles. -Patient to continue soft, supportive shoe gear daily. -Toenails 1-5 left, R hallux, R 3rd toe, R 4th toe, and R 5th toe debrided in length and girth without iatrogenic bleeding with sterile nail nipper and dremel.  -Callus(es) submet head 3 right foot pared utilizing sterile scalpel blade without complication or incident. Total number debrided =1. -Patient to report any  pedal injuries to medical professional immediately. -Patient/POA to call should there be  question/concern in the interim.  Return in about 3 months (around 03/23/2021).  Marzetta Board, DPM

## 2021-01-03 DIAGNOSIS — E1142 Type 2 diabetes mellitus with diabetic polyneuropathy: Secondary | ICD-10-CM | POA: Diagnosis not present

## 2021-01-03 DIAGNOSIS — I700 Atherosclerosis of aorta: Secondary | ICD-10-CM | POA: Diagnosis not present

## 2021-01-06 ENCOUNTER — Telehealth: Payer: Self-pay | Admitting: Primary Care

## 2021-01-06 NOTE — Telephone Encounter (Signed)
[name] called in from [facility] and stated that he declined care through [facility] but was able to do certain assessments and they are sending in the care plan.

## 2021-01-06 NOTE — Telephone Encounter (Signed)
Noted  

## 2021-01-10 ENCOUNTER — Encounter: Payer: Self-pay | Admitting: Family Medicine

## 2021-01-10 DIAGNOSIS — E1169 Type 2 diabetes mellitus with other specified complication: Secondary | ICD-10-CM | POA: Diagnosis not present

## 2021-01-10 DIAGNOSIS — E1159 Type 2 diabetes mellitus with other circulatory complications: Secondary | ICD-10-CM | POA: Diagnosis not present

## 2021-01-10 DIAGNOSIS — E1142 Type 2 diabetes mellitus with diabetic polyneuropathy: Secondary | ICD-10-CM | POA: Diagnosis not present

## 2021-01-10 DIAGNOSIS — E669 Obesity, unspecified: Secondary | ICD-10-CM | POA: Diagnosis not present

## 2021-01-11 DIAGNOSIS — R0902 Hypoxemia: Secondary | ICD-10-CM | POA: Diagnosis not present

## 2021-01-11 DIAGNOSIS — G4733 Obstructive sleep apnea (adult) (pediatric): Secondary | ICD-10-CM | POA: Diagnosis not present

## 2021-01-11 DIAGNOSIS — J449 Chronic obstructive pulmonary disease, unspecified: Secondary | ICD-10-CM | POA: Diagnosis not present

## 2021-02-02 DIAGNOSIS — J329 Chronic sinusitis, unspecified: Secondary | ICD-10-CM | POA: Diagnosis not present

## 2021-02-10 DIAGNOSIS — J449 Chronic obstructive pulmonary disease, unspecified: Secondary | ICD-10-CM | POA: Diagnosis not present

## 2021-02-10 DIAGNOSIS — G4733 Obstructive sleep apnea (adult) (pediatric): Secondary | ICD-10-CM | POA: Diagnosis not present

## 2021-02-10 DIAGNOSIS — R0902 Hypoxemia: Secondary | ICD-10-CM | POA: Diagnosis not present

## 2021-02-22 DIAGNOSIS — H524 Presbyopia: Secondary | ICD-10-CM | POA: Diagnosis not present

## 2021-02-22 DIAGNOSIS — H2513 Age-related nuclear cataract, bilateral: Secondary | ICD-10-CM | POA: Diagnosis not present

## 2021-02-22 DIAGNOSIS — E119 Type 2 diabetes mellitus without complications: Secondary | ICD-10-CM | POA: Diagnosis not present

## 2021-02-22 DIAGNOSIS — E089 Diabetes mellitus due to underlying condition without complications: Secondary | ICD-10-CM | POA: Diagnosis not present

## 2021-02-23 DIAGNOSIS — J449 Chronic obstructive pulmonary disease, unspecified: Secondary | ICD-10-CM | POA: Diagnosis not present

## 2021-02-23 DIAGNOSIS — G4733 Obstructive sleep apnea (adult) (pediatric): Secondary | ICD-10-CM | POA: Diagnosis not present

## 2021-02-23 LAB — HM DIABETES EYE EXAM

## 2021-03-05 ENCOUNTER — Encounter: Payer: Self-pay | Admitting: Primary Care

## 2021-03-22 ENCOUNTER — Telehealth: Payer: Self-pay

## 2021-03-22 NOTE — Telephone Encounter (Signed)
Spoke with patient and he declined Medicare Wellness Visits at this time. Will see provider for routine physical exams.

## 2021-03-29 ENCOUNTER — Ambulatory Visit (INDEPENDENT_AMBULATORY_CARE_PROVIDER_SITE_OTHER): Payer: HMO | Admitting: Podiatry

## 2021-03-29 ENCOUNTER — Other Ambulatory Visit: Payer: Self-pay

## 2021-03-29 ENCOUNTER — Encounter: Payer: Self-pay | Admitting: Podiatry

## 2021-03-29 DIAGNOSIS — M79675 Pain in left toe(s): Secondary | ICD-10-CM | POA: Diagnosis not present

## 2021-03-29 DIAGNOSIS — M79674 Pain in right toe(s): Secondary | ICD-10-CM | POA: Diagnosis not present

## 2021-03-29 DIAGNOSIS — E119 Type 2 diabetes mellitus without complications: Secondary | ICD-10-CM | POA: Diagnosis not present

## 2021-03-29 DIAGNOSIS — Q828 Other specified congenital malformations of skin: Secondary | ICD-10-CM

## 2021-03-29 DIAGNOSIS — B351 Tinea unguium: Secondary | ICD-10-CM | POA: Diagnosis not present

## 2021-03-29 DIAGNOSIS — Z794 Long term (current) use of insulin: Secondary | ICD-10-CM | POA: Diagnosis not present

## 2021-04-01 NOTE — Progress Notes (Signed)
  Subjective:  Patient ID: [name], male    DOB: 10-06-1950,  MRN: [id number]  70 y.o. male presents with preventative diabetic foot care and painful porokeratotic lesion(s) right foot and painful mycotic toenails that limit ambulation. Painful toenails interfere with ambulation. Aggravating factors include wearing enclosed shoe gear. Pain is relieved with periodic professional debridement. Painful porokeratotic lesions are aggravated when weightbearing with and without shoegear. Pain is relieved with periodic professional debridement..    Patient's blood sugar was 125 mg/dl today.    [name] notes no new pedal problems on today's visit.  PCP: Juan Koch, NP and last visit was: 10/26/2020  Review of Systems: Negative except as noted in the HPI.   Allergies  Allergen Reactions   Atorvastatin     Other reaction(s): Headache   Other Other (See Comments)    STATIN DRUG-CAUSED MUSCLE PAIN/LETHARGIC    Objective:  There were no vitals filed for this visit. Constitutional Patient is a pleasant 70 y.o. Caucasian male morbidly obese in NAD. AAO x 3.  Vascular Capillary fill time to digits <3 seconds b/l lower extremities. Faintly palpable DP pulse(s) b/l lower extremities. Palpable PT pulse(s) b/l lower extremities. Pedal hair sparse. Lower extremity skin temperature gradient within normal limits. No pain with calf compression b/l. No cyanosis or clubbing noted.   Neurologic Normal speech. Protective sensation intact 5/5 intact bilaterally with 10g monofilament b/l. Vibratory sensation intact b/l.  Dermatologic Pedal skin warm and supple b/l. No open wounds b/l lower extremities. No interdigital macerations b/l lower extremities. Toenails 1-5 b/l elongated, discolored, dystrophic, thickened, crumbly with subungual debris and tenderness to dorsal palpation.   Orthopedic: Normal muscle strength 5/5 to all lower extremity muscle groups bilaterally. Hammertoe(s) noted to the 1-5  bilaterally.   Hemoglobin A1C 09/07/2020  HGBA1C 7.6  Some recent data might be hidden   Assessment:   1. Pain due to onychomycosis of toenails of both feet   2. Porokeratosis   3. Type 2 diabetes mellitus without complication, with long-term current use of insulin ([place])    Plan:  Patient was evaluated and treated and all questions answered. Consent given for treatment as described below: -Examined patient. -Continue diabetic foot care principles: inspect feet daily, monitor glucose as recommended by PCP and/or Endocrinologist, and follow prescribed diet per PCP, Endocrinologist and/or dietician. -Patient to continue soft, supportive shoe gear daily. -Toenails 1-5 b/l were debrided in length and girth with sterile nail nippers and dremel without iatrogenic bleeding.  -Painful porokeratotic lesion(s) submet head 3 right foot pared and enucleated with sterile scalpel blade without incident. Total number of lesions debrided=1. -Patient to report any pedal injuries to medical professional immediately. -Patient/POA to call should there be question/concern in the interim.  Return in about 3 months (around 06/28/2021).  Marzetta Board, DPM

## 2021-04-12 DIAGNOSIS — E1142 Type 2 diabetes mellitus with diabetic polyneuropathy: Secondary | ICD-10-CM | POA: Diagnosis not present

## 2021-04-19 ENCOUNTER — Telehealth: Payer: Self-pay

## 2021-04-19 DIAGNOSIS — Z794 Long term (current) use of insulin: Secondary | ICD-10-CM | POA: Diagnosis not present

## 2021-04-19 DIAGNOSIS — E1142 Type 2 diabetes mellitus with diabetic polyneuropathy: Secondary | ICD-10-CM | POA: Diagnosis not present

## 2021-04-19 DIAGNOSIS — I10 Essential (primary) hypertension: Secondary | ICD-10-CM | POA: Diagnosis not present

## 2021-04-19 DIAGNOSIS — E1169 Type 2 diabetes mellitus with other specified complication: Secondary | ICD-10-CM | POA: Diagnosis not present

## 2021-04-19 DIAGNOSIS — E669 Obesity, unspecified: Secondary | ICD-10-CM | POA: Diagnosis not present

## 2021-04-19 DIAGNOSIS — E1159 Type 2 diabetes mellitus with other circulatory complications: Secondary | ICD-10-CM | POA: Diagnosis not present

## 2021-04-19 NOTE — Chronic Care Management (AMB) (Addendum)
Chronic Care Management Pharmacy Assistant   Name: [name]  MRN: [id number] DOB: [birth year]  Reason for Encounter:  Diabetes Disease State  Recent office visits:  04/27/21-PCP-Patient presented for AWV. Flu vaccine given, labs ordered(liver and prostate look good). Increase levothyroxine to 114mcg-repeat labs 2 months.Encouraged walking for exercise, diet, increase water intake. Referral for physical therapy.   Recent consult visits:  03/29/21-Podiatry-Patient presented for toenail trimming. No medication changes.  Hospital visits:  None in previous 6 months  Medications: Outpatient Encounter Medications as of 04/19/2021  Medication Sig   albuterol (PROVENTIL HFA;VENTOLIN HFA) 108 (90 Base) MCG/ACT inhaler Inhale 2 puffs into the lungs every 4 (four) hours as needed for wheezing or shortness of breath.   amLODipine (NORVASC) 5 MG tablet Take 10 mg by mouth daily.   amLODipine (NORVASC) 5 MG tablet Take 1 tablet by mouth daily.   amoxicillin (AMOXIL) 500 MG capsule Take 500 mg by mouth every 8 (eight) hours.   amoxicillin-clavulanate (AUGMENTIN) 875-125 MG tablet Take 1 tablet by mouth 2 (two) times daily.   aspirin 81 MG EC tablet Take by mouth.   aspirin EC 81 MG tablet Take 81 mg by mouth daily.   Continuous Blood Gluc Sensor (FREESTYLE LIBRE 14 DAY SENSOR) MISC SMARTSIG:1 Each Topical Every 2 Weeks   fenofibrate (TRICOR) 145 MG tablet Take 145 mg by mouth daily.   fluticasone (FLONASE) 50 MCG/ACT nasal spray Place 1 spray into both nostrils 2 (two) times daily.   glipiZIDE (GLUCOTROL XL) 2.5 MG 24 hr tablet glipizide ER 2.5 mg tablet, extended release 24 hr   hydrochlorothiazide (HYDRODIURIL) 50 MG tablet Take 1 tablet (50 mg total) by mouth daily.   insulin degludec (TRESIBA) 200 UNIT/ML FlexTouch Pen Inject 70 Units into the skin daily.    levocetirizine (XYZAL) 5 MG tablet TAKE 1 TABLET BY MOUTH ONCE EVERY EVENING FOR ALLERGIES   levothyroxine (SYNTHROID) 100 MCG tablet  TAKE 1 TABLET BY MOUTH ONCE A DAY. TAKE ON AN EMPTY STOMACH WITH A GLASS OF WATER ATLEAST 30-60 MIN BEFORE BREAKFAST   metFORMIN (GLUCOPHAGE) 1000 MG tablet TAKE 1 TABLET BY MOUTH TWICE A DAY   NOVOLOG FLEXPEN 100 UNIT/ML FlexPen Inject 28 units before breakfast, inject 24 units beforelunch, inject 34 units before supper. Increase when blood sugar is over 150. Use 2 units more for every 50 over.   ramipril (ALTACE) 10 MG capsule TAKE 1 CAPSULE BY MOUTH 2 TIMES DAILY   rosuvastatin (CRESTOR) 10 MG tablet Take 1 tablet (10 mg total) by mouth daily. For cholesterol.   TRUEPLUS 5-BEVEL PEN NEEDLES 31G X 6 MM MISC    No facility-administered encounter medications on file as of 04/19/2021.      Recent Relevant Labs: Lab Results  Component Value Date/Time   HGBA1C 7.6 09/07/2020 12:00 AM   HGBA1C 7.5 (H) 06/01/2016 07:50 AM   HGBA1C 8.7 11/26/2015 08:55 AM   HGBA1C 8.0 08/27/2015 09:58 AM   MICROALBUR 1.1 06/01/2016 07:50 AM    Kidney Function Lab Results  Component Value Date/Time   CREATININE 1.1 09/07/2020 12:00 AM   CREATININE 1.07 06/01/2016 07:50 AM   CREATININE 1.05 02/19/2015 09:23 AM   GFR 74.02 06/01/2016 07:50 AM   GFRNONAA 76 02/19/2015 09:23 AM   GFRAA 88 02/19/2015 09:23 AM     Contacted patient on 04/28/21 to discuss diabetes disease state.   Current antihyperglycemic regimen:  Tresiba 200 unit/ml 80 units into the skin daily Metformin 1000 mg twice daily  Novolog 100 unit/ml 34 units before breakfast/ 24units before lunch/34 units before supper Increase when blood sugar is over 150. Use 2 units more for every 50 over.    Patient verbally confirms he is taking the above medications as directed. Yes  What diet changes have been made to improve diabetes control? None reported  What recent interventions/DTPs have been made to improve glycemic control:  PCP discussed the importance of a healthy diet and regular exercise in order for weight loss, and to reduce  the risk of further co-morbidity.  Have there been any recent hospitalizations or ED visits since last visit with CPP? No  Patient reports hypoglycemic symptoms, including Nervous/irritable - he did not feel well yesterday after visiting PCP for AWV. BG dropped to 53. States he ate cereal for breakfast.   Patient denies hyperglycemic symptoms, including blurry vision, excessive thirst, fatigue, polyuria, and weakness  How often are you checking your blood sugar? 3-4 times daily The patient uses Freestyle Libre 2: Average BG 156 last 7 days, 162 average last 14 days,159 average last 30 days, 158 average 90 days.  What are your blood sugars ranging? BG Log from 04/27/21 Fasting: 130 Before meals: 53 ( 11:00am) After meals: 160 Bedtime:113  During the week, how often does your blood glucose drop below 70? Yes, yesterday - 04/27/21- The patient reported a lunch time reading of 53  Reviewed The 15-15 rule for low sugars: If sugar reading below 70, have 15 grams of carbohydrate to raise your blood sugar and check it after 15 minutes. If it's still below 70 mg/dL, have another serving. 15 grams of carbs may be: -Glucose tablets (see instructions) -Gel tube (see instructions) -4 ounces (1/2 cup) of juice or regular soda (not diet) -1 tablespoon of sugar, honey, or corn syrup -Hard candies, jellybeans or gumdrops--see food label for how many to consume  Repeat these steps until your blood sugar is at least 70 mg/dL. Once your blood sugar is back to normal, eat a meal or snack to make sure it doesn't lower again.     Are you checking your feet daily/regularly? Yes the patient goes to podiatry every 4 months.  Adherence Review: Is the patient currently on a STATIN medication? Yes Is the patient currently on ACE/ARB medication? Yes Does the patient have >5 day gap between last estimated fill dates? No  Care Gaps: Annual wellness visit in last year?  04/27/21 Most recent A1C reading:  7.8    04/12/21 Most Recent BP reading:  134/78  104-P  Last eye exam / retinopathy screening:02/23/21 Last diabetic foot exam:  03/29/21   Star Rating Drugs:  Medication:  Last Fill: Day Supply Metformin 1000mg  02/19/21  90 Ramipril 10mg   04/18/21 90 Rosuvastatin 10mg  02/07/21 90 Novolog  04/11/21 30 Tresiba  03/15/21 77     PCP appointment on 04/27/21 CCM visit 05/2021  Debbora Dus, CPP notified  Avel Sensor, [place] Assistant [phone]  I have reviewed the care management and care coordination activities outlined in this encounter and I am certifying that I agree with the content of this note. No further action required.  Debbora Dus, PharmD Clinical Pharmacist [facility] [phone]

## 2021-04-27 ENCOUNTER — Other Ambulatory Visit: Payer: Self-pay

## 2021-04-27 ENCOUNTER — Ambulatory Visit (INDEPENDENT_AMBULATORY_CARE_PROVIDER_SITE_OTHER): Payer: HMO | Admitting: Primary Care

## 2021-04-27 VITALS — BP 134/78 | HR 104 | Temp 98.6°F | Ht 70.0 in | Wt 310.0 lb

## 2021-04-27 DIAGNOSIS — E1165 Type 2 diabetes mellitus with hyperglycemia: Secondary | ICD-10-CM | POA: Diagnosis not present

## 2021-04-27 DIAGNOSIS — I10 Essential (primary) hypertension: Secondary | ICD-10-CM | POA: Diagnosis not present

## 2021-04-27 DIAGNOSIS — J449 Chronic obstructive pulmonary disease, unspecified: Secondary | ICD-10-CM | POA: Diagnosis not present

## 2021-04-27 DIAGNOSIS — J069 Acute upper respiratory infection, unspecified: Secondary | ICD-10-CM

## 2021-04-27 DIAGNOSIS — Z860100 Personal history of colon polyps, unspecified: Secondary | ICD-10-CM

## 2021-04-27 DIAGNOSIS — Z6841 Body Mass Index (BMI) 40.0 and over, adult: Secondary | ICD-10-CM

## 2021-04-27 DIAGNOSIS — E039 Hypothyroidism, unspecified: Secondary | ICD-10-CM

## 2021-04-27 DIAGNOSIS — Z125 Encounter for screening for malignant neoplasm of prostate: Secondary | ICD-10-CM

## 2021-04-27 DIAGNOSIS — E7800 Pure hypercholesterolemia, unspecified: Secondary | ICD-10-CM

## 2021-04-27 DIAGNOSIS — M170 Bilateral primary osteoarthritis of knee: Secondary | ICD-10-CM | POA: Diagnosis not present

## 2021-04-27 DIAGNOSIS — Z794 Long term (current) use of insulin: Secondary | ICD-10-CM

## 2021-04-27 DIAGNOSIS — Z23 Encounter for immunization: Secondary | ICD-10-CM

## 2021-04-27 DIAGNOSIS — Z0000 Encounter for general adult medical examination without abnormal findings: Secondary | ICD-10-CM

## 2021-04-27 DIAGNOSIS — K219 Gastro-esophageal reflux disease without esophagitis: Secondary | ICD-10-CM

## 2021-04-27 DIAGNOSIS — J309 Allergic rhinitis, unspecified: Secondary | ICD-10-CM

## 2021-04-27 DIAGNOSIS — I700 Atherosclerosis of aorta: Secondary | ICD-10-CM | POA: Diagnosis not present

## 2021-04-27 LAB — HEPATIC FUNCTION PANEL
ALT: 27 U/L (ref 0–53)
AST: 29 U/L (ref 0–37)
Albumin: 4.6 g/dL (ref 3.5–5.2)
Alkaline Phosphatase: 47 U/L (ref 39–117)
Bilirubin, Direct: 0.1 mg/dL (ref 0.0–0.3)
Total Bilirubin: 0.5 mg/dL (ref 0.2–1.2)
Total Protein: 7.2 g/dL (ref 6.0–8.3)

## 2021-04-27 LAB — TSH: TSH: 5.88 u[IU]/mL — ABNORMAL HIGH (ref 0.35–5.50)

## 2021-04-27 LAB — PSA, MEDICARE: PSA: 1.06 ng/ml (ref 0.10–4.00)

## 2021-04-27 MED ORDER — ALBUTEROL SULFATE HFA 108 (90 BASE) MCG/ACT IN AERS: 2.0000 | INHALATION_SPRAY | RESPIRATORY_TRACT | 0 refills | Status: DC | PRN

## 2021-04-27 NOTE — Assessment & Plan Note (Addendum)
Controlled, infrequent use of albuterol. Refill provided for albuterol as his is expired.   Lung cancer screening due in December 2022.

## 2021-04-27 NOTE — Patient Instructions (Signed)
Stop by the lab prior to leaving today. I will notify you of your results once received.   You will be contacted regarding your referral to physical therapy.  Please let us know if you have not been contacted within two weeks.   Start exercising. Start walking at least 5 minutes daily. Increase duration of walking by a few minutes as tolerated.   It is important that you improve your diet. Please limit carbohydrates in the form of white bread, rice, pasta, sweets, fast food, fried food, sugary drinks, etc. Increase your consumption of fresh fruits and vegetables, whole grains, lean protein.  Ensure you are consuming 64 ounces of water daily.  It was a pleasure to see you today!  Preventive Care 68 Years and Older, Male Preventive care refers to lifestyle choices and visits with your health care provider that can promote health and wellness. This includes: A yearly physical exam. This is also called an annual wellness visit. Regular dental and eye exams. Immunizations. Screening for certain conditions. Healthy lifestyle choices, such as: Eating a healthy diet. Getting regular exercise. Not using drugs or products that contain nicotine and tobacco. Limiting alcohol use. What can I expect for my preventive care visit? Physical exam Your health care provider will check your: Height and weight. These may be used to calculate your BMI (body mass index). BMI is a measurement that tells if you are at a healthy weight. Heart rate and blood pressure. Body temperature. Skin for abnormal spots. Counseling Your health care provider may ask you questions about your: Past medical problems. Family's medical history. Alcohol, tobacco, and drug use. Emotional well-being. Home life and relationship well-being. Sexual activity. Diet, exercise, and sleep habits. History of falls. Memory and ability to understand (cognition). Work and work Statistician. Access to firearms. What immunizations do I  need? Vaccines are usually given at various ages, according to a schedule. Your health care provider will recommend vaccines for you based on your age, medical history, and lifestyle or other factors, such as travel or where you work. What tests do I need? Blood tests Lipid and cholesterol levels. These may be checked every 5 years, or more often depending on your overall health. Hepatitis C test. Hepatitis B test. Screening Lung cancer screening. You may have this screening every year starting at age 68 if you have a 30-pack-year history of smoking and currently smoke or have quit within the past 15 years. Colorectal cancer screening. All adults should have this screening starting at age 68 and continuing until age 68. Your health care provider may recommend screening at age 68 if you are at increased risk. You will have tests every 1-10 years, depending on your results and the type of screening test. Prostate cancer screening. Recommendations will vary depending on your family history and other risks. Genital exam to check for testicular cancer or hernias. Diabetes screening. This is done by checking your blood sugar (glucose) after you have not eaten for a while (fasting). You may have this done every 1-3 years. Abdominal aortic aneurysm (AAA) screening. You may need this if you are a current or former smoker. STD (sexually transmitted disease) testing, if you are at risk. Follow these instructions at home: Eating and drinking  Eat a diet that includes fresh fruits and vegetables, whole grains, lean protein, and low-fat dairy products. Limit your intake of foods with high amounts of sugar, saturated fats, and salt. Take vitamin and mineral supplements as recommended by your health care provider. Do  not drink alcohol if your health care provider tells you not to drink. If you drink alcohol: Limit how much you have to 0-2 drinks a day. Be aware of how much alcohol is in your drink. In  the [place] one drink equals one 12 oz bottle of beer (355 mL), one 5 oz glass of wine (148 mL), or one 1 oz glass of hard liquor (44 mL). Lifestyle Take daily care of your teeth and gums. Brush your teeth every morning and night with fluoride toothpaste. Floss one time each day. Stay active. Exercise for at least 30 minutes 5 or more days each week. Do not use any products that contain nicotine or tobacco, such as cigarettes, e-cigarettes, and chewing tobacco. If you need help quitting, ask your health care provider. Do not use drugs. If you are sexually active, practice safe sex. Use a condom or other form of protection to prevent STIs (sexually transmitted infections). Talk with your health care provider about taking a low-dose aspirin or statin. Find healthy ways to cope with stress, such as: Meditation, yoga, or listening to music. Journaling. Talking to a trusted person. Spending time with friends and family. Safety Always wear your seat belt while driving or riding in a vehicle. Do not drive: If you have been drinking alcohol. Do not ride with someone who has been drinking. When you are tired or distracted. While texting. Wear a helmet and other protective equipment during sports activities. If you have firearms in your house, make sure you follow all gun safety procedures. What's next? Visit your health care provider once a year for an annual wellness visit. Ask your health care provider how often you should have your eyes and teeth checked. Stay up to date on all vaccines. This information is not intended to replace advice given to you by your health care provider. Make sure you discuss any questions you have with your health care provider. Document Revised: 09/10/2020 Document Reviewed: 06/27/2018 Elsevier Patient Education  2022 [employer].

## 2021-04-27 NOTE — Assessment & Plan Note (Signed)
Discussed the importance of a healthy diet and regular exercise in order for weight loss, and to reduce the risk of further co-morbidity.  

## 2021-04-27 NOTE — Progress Notes (Signed)
Subjective:    Patient ID: [name], male    DOB: Jun 08, 1951, 70 y.o.   MRN: [id number]  HPI  [name] is a very pleasant 70 y.o. male who presents today for complete physical and follow up of chronic conditions.  Chronic pain to bilateral knees. Following with sports medicine who recommended knee injections. Unfortunately the injections are cost prohibitive. He is mostly sedentary throughout the day. His wife tries to encourage him to use the recumbent bike but he doesn't. He would like to try physical therapy.  Immunizations: -Tetanus: 2016 -Influenza: Due -Covid-19: Completed 3 vaccines -Shingles: Completed Shingrix  -Pneumonia: Prevnar 13 in 2019, Pneumovax in 2017  Diet: [place].  Exercise: No regular exercise.  Eye exam: Completes annually  Dental exam: No recent visit.  Colonoscopy: Completed in 2022, due 2025 PSA: Due  Lung Cancer Screening: Completed in December 2021  He is checking BP at home which is running 140's/80's.   BP Readings from Last 3 Encounters:  04/27/21 134/78  12/03/20 (!) 150/71  10/26/20 135/70    Wt Readings from Last 3 Encounters:  04/27/21 (!) 310 lb (140.6 kg)  12/03/20 (!) 305 lb (138.3 kg)  10/26/20 (!) 305 lb 5 oz (138.5 kg)     Review of Systems  Constitutional:  Negative for unexpected weight change.  HENT:  Negative for rhinorrhea.   Eyes:  Negative for visual disturbance.  Respiratory:  Negative for cough and shortness of breath.   Cardiovascular:  Negative for chest pain.  Gastrointestinal:  Negative for constipation and diarrhea.  Genitourinary:  Negative for difficulty urinating.  Musculoskeletal:  Positive for arthralgias.  Skin:  Negative for rash.  Allergic/Immunologic: Positive for environmental allergies.  Neurological:  Negative for dizziness and headaches.  Psychiatric/Behavioral:  The patient is not nervous/anxious.         Past Medical History:  Diagnosis Date   Adenomatous polyp    Aortic  atherosclerosis ([place]) 06/16/2020   Arthritis    knee   Chronic obstructive lung disease ([place])    Colon polyp 2008   COPD with emphysema ([place])    Diabetes mellitus    Emphysema lung ([place]) 06/16/2020   Emphysema lung ([facility])    Hypercholesteremia    Hyperlipemia    Hypertension    Hypothyroidism    Observed sleep apnea    Sleep apnea    Statin myopathy     Social History   Socioeconomic History   Marital status: Married    Spouse name: Not on file   Number of children: Not on file   Years of education: Not on file   Highest education level: Not on file  Occupational History   Not on file  Tobacco Use   Smoking status: Former    Packs/day: 2.00    Years: 35.00    Pack years: 70.00    Types: Cigarettes, E-cigarettes    Quit date: 12/11/2014    Years since quitting: 6.3   Smokeless tobacco: Never  Vaping Use   Vaping Use: Never used  Substance and Sexual Activity   Alcohol use: No    Alcohol/week: 0.0 standard drinks   Drug use: No   Sexual activity: Not on file  Other Topics Concern   Not on file  Social History Narrative   Married.   3 children. 2 grandchildren.   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    Social Determinants of  Health   Financial Resource Strain: Not on file  Food Insecurity: Not on file  Transportation Needs: Not on file  Physical Activity: Not on file  Stress: Not on file  Social Connections: Not on file  Intimate Partner Violence: Not on file    Past Surgical History:  Procedure Laterality Date   COLONOSCOPY  2008, 2016   Dr Bary Castilla   COLONOSCOPY WITH PROPOFOL N/A 10/03/2017   Procedure: COLONOSCOPY WITH PROPOFOL;  Surgeon: Robert Bellow, MD;  [facility]COPY;  Service: Endoscopy;  Laterality: N/A;   COLONOSCOPY WITH PROPOFOL N/A 10/08/2020   Procedure: COLONOSCOPY WITH PROPOFOL;  Surgeon: Robert Bellow, MD;  Location: [facility] ENDOSCOPY;  Service: Endoscopy;   Laterality: N/A;   DERMATOFIBROMA  03/26/15   Procedure: Excision mass left leg;  Surgeon: Robert Bellow, MD;  Location: [facility] ORS;  Service: General;  Laterality: N/A;   LIPOMA EXCISION N/A 03/26/2015       TONSILLECTOMY      Family History  Problem Relation Age of Onset   Stroke Mother    Diabetes Mother    Stroke Father    Diabetes Other    Other Daughter 25       precancerous colon polyp    Diabetes Maternal Grandmother    Diabetes Paternal Grandmother     Allergies  Allergen Reactions   Atorvastatin     Other reaction(s): Headache   Other Other (See Comments)    STATIN DRUG-CAUSED MUSCLE PAIN/LETHARGIC    Current Outpatient Medications on File Prior to Visit  Medication Sig Dispense Refill   amLODipine (NORVASC) 5 MG tablet Take 1 tablet by mouth daily.     aspirin 81 MG EC tablet Take by mouth.     Continuous Blood Gluc Sensor (FREESTYLE LIBRE 14 DAY SENSOR) MISC SMARTSIG:1 Each Topical Every 2 Weeks     fenofibrate (TRICOR) 145 MG tablet Take 145 mg by mouth daily.     fluticasone (FLONASE) 50 MCG/ACT nasal spray Place 1 spray into both nostrils 2 (two) times daily. 16 g 0   glipiZIDE (GLUCOTROL XL) 2.5 MG 24 hr tablet glipizide ER 2.5 mg tablet, extended release 24 hr     hydrochlorothiazide (HYDRODIURIL) 50 MG tablet Take 1 tablet (50 mg total) by mouth daily. 90 tablet 1   insulin degludec (TRESIBA) 200 UNIT/ML FlexTouch Pen Inject 80 Units into the skin daily.     levocetirizine (XYZAL) 5 MG tablet TAKE 1 TABLET BY MOUTH ONCE EVERY EVENING FOR ALLERGIES 90 tablet 1   levothyroxine (SYNTHROID) 100 MCG tablet TAKE 1 TABLET BY MOUTH ONCE A DAY. TAKE ON AN EMPTY STOMACH WITH A GLASS OF WATER ATLEAST 30-60 MIN BEFORE BREAKFAST 90 tablet 3   metFORMIN (GLUCOPHAGE) 1000 MG tablet TAKE 1 TABLET BY MOUTH TWICE A DAY 180 tablet 1   NOVOLOG FLEXPEN 100 UNIT/ML FlexPen Inject 28 units before breakfast, inject 24 units beforelunch, inject 34 units before supper. Increase when  blood sugar is over 150. Use 2 units more for every 50 over.  9   ramipril (ALTACE) 10 MG capsule TAKE 1 CAPSULE BY MOUTH 2 TIMES DAILY 180 capsule 1   rosuvastatin (CRESTOR) 10 MG tablet Take 1 tablet (10 mg total) by mouth daily. For cholesterol. 90 tablet 3   TRUEPLUS 5-BEVEL PEN NEEDLES 31G X 6 MM MISC      No current facility-administered medications on file prior to visit.    BP 134/78   Pulse (!) 104   Temp 98.6  F (37 C) (Temporal)   Ht 5\' 10"  (1.778 m)   Wt (!) 310 lb (140.6 kg)   SpO2 96%   BMI 44.48 kg/m  Objective:   Physical Exam HENT:     Right Ear: Tympanic membrane and ear canal normal.     Left Ear: Tympanic membrane and ear canal normal.     Nose: Nose normal.     Right Sinus: No maxillary sinus tenderness or frontal sinus tenderness.     Left Sinus: No maxillary sinus tenderness or frontal sinus tenderness.  Eyes:     Conjunctiva/sclera: Conjunctivae normal.  Neck:     Thyroid: No thyromegaly.     Vascular: No carotid bruit.  Cardiovascular:     Rate and Rhythm: Normal rate and regular rhythm.     Heart sounds: Normal heart sounds.  Pulmonary:     Effort: Pulmonary effort is normal.     Breath sounds: Normal breath sounds. No wheezing or rales.  Abdominal:     General: Bowel sounds are normal.     Palpations: Abdomen is soft.     Tenderness: There is no abdominal tenderness.  Musculoskeletal:        General: Normal range of motion.     Cervical back: Neck supple.     Comments: Generalized weakness to lower extremities when ambulating. Decrease in ROM to bilateral knees.   Skin:    General: Skin is warm and dry.  Neurological:     Mental Status: He is alert and oriented to person, place, and time.     Cranial Nerves: No cranial nerve deficit.     Deep Tendon Reflexes: Reflexes are normal and symmetric.  Psychiatric:        Mood and Affect: Mood normal.          Assessment & Plan:      This visit occurred during the SARS-CoV-2 public  health emergency.  Safety protocols were in place, including screening questions prior to the visit, additional usage of staff PPE, and extensive cleaning of exam room while observing appropriate contact time as indicated for disinfecting solutions.

## 2021-04-27 NOTE — Assessment & Plan Note (Signed)
Influenza vaccine provided today. PSA due and pending. Colonoscopy UTD, due 2025.  Discussed the importance of a healthy diet and regular exercise in order for weight loss, and to reduce the risk of further co-morbidity.  Exam today as noted. Labs pending and also reviewed.

## 2021-04-27 NOTE — Assessment & Plan Note (Signed)
Chronic, continued.  Strongly advised weight loss and to increase activity.  Referral placed to physical therapy for strength improvement.

## 2021-04-27 NOTE — Assessment & Plan Note (Addendum)
Above goal in the office today, improved with repeated measurement.   Strongly advised he work on weight loss through improvements in diet and physical activity.   Continue amlodipine 5 mg, HCTZ 50 mg, ramipril 10 mg BID.  BMP from June 2022 reviewed from [place]

## 2021-04-27 NOTE — Assessment & Plan Note (Addendum)
LDL at goal from labs in June 2022, reviewed from [place].   Continue rosuvastatin 10 mg and fenofibrate 145 mg.

## 2021-04-27 NOTE — Assessment & Plan Note (Signed)
Stable. Doing well on Xyzal 5 mg. Continue same.

## 2021-04-27 NOTE — Assessment & Plan Note (Signed)
Following with endocrinology. Labs from [facility] reviewed.  Continue Tresiba 80 units daily, metformin 1000 mg BID, glipizide XL 10 mg.

## 2021-04-27 NOTE — Assessment & Plan Note (Signed)
Reviewed labs from [place] from June 2022. LDL at goal.  Continue rosuvastatin 10 mg and fenofibrate 145 mg.

## 2021-04-27 NOTE — Assessment & Plan Note (Signed)
He appears to be taking levothyroxine 100 mcg correctly. Repeat TSH pending.   Continue levothyroxine 100 mcg.

## 2021-04-27 NOTE — Assessment & Plan Note (Signed)
Colonoscopy UTD, due again in 2025.

## 2021-04-27 NOTE — Assessment & Plan Note (Signed)
Stable, not concerns today

## 2021-05-12 DIAGNOSIS — M25561 Pain in right knee: Secondary | ICD-10-CM | POA: Diagnosis not present

## 2021-05-12 DIAGNOSIS — M25662 Stiffness of left knee, not elsewhere classified: Secondary | ICD-10-CM | POA: Diagnosis not present

## 2021-05-12 DIAGNOSIS — M25661 Stiffness of right knee, not elsewhere classified: Secondary | ICD-10-CM | POA: Diagnosis not present

## 2021-05-12 DIAGNOSIS — M25562 Pain in left knee: Secondary | ICD-10-CM | POA: Diagnosis not present

## 2021-05-12 DIAGNOSIS — M6281 Muscle weakness (generalized): Secondary | ICD-10-CM | POA: Diagnosis not present

## 2021-05-17 DIAGNOSIS — M25662 Stiffness of left knee, not elsewhere classified: Secondary | ICD-10-CM | POA: Diagnosis not present

## 2021-05-17 DIAGNOSIS — M25561 Pain in right knee: Secondary | ICD-10-CM | POA: Diagnosis not present

## 2021-05-17 DIAGNOSIS — M25562 Pain in left knee: Secondary | ICD-10-CM | POA: Diagnosis not present

## 2021-05-17 DIAGNOSIS — M25661 Stiffness of right knee, not elsewhere classified: Secondary | ICD-10-CM | POA: Diagnosis not present

## 2021-05-17 DIAGNOSIS — M6281 Muscle weakness (generalized): Secondary | ICD-10-CM | POA: Diagnosis not present

## 2021-05-19 DIAGNOSIS — M25662 Stiffness of left knee, not elsewhere classified: Secondary | ICD-10-CM | POA: Diagnosis not present

## 2021-05-19 DIAGNOSIS — M25562 Pain in left knee: Secondary | ICD-10-CM | POA: Diagnosis not present

## 2021-05-19 DIAGNOSIS — M6281 Muscle weakness (generalized): Secondary | ICD-10-CM | POA: Diagnosis not present

## 2021-05-19 DIAGNOSIS — M25661 Stiffness of right knee, not elsewhere classified: Secondary | ICD-10-CM | POA: Diagnosis not present

## 2021-05-19 DIAGNOSIS — M25561 Pain in right knee: Secondary | ICD-10-CM | POA: Diagnosis not present

## 2021-05-20 ENCOUNTER — Telehealth: Payer: Self-pay

## 2021-05-20 NOTE — Chronic Care Management (AMB) (Addendum)
Chronic Care Management Pharmacy Assistant   Name: [name]  MRN: [id number] DOB: 1951/02/07  Reason for Encounter:  General Adherence   Recent office visits:  04/27/21-PCP-Patient presented for AWV. Flu shot, refilled albuterol, labs ordered, physical therapy ordered, no medication changes.  Recent consult visits:  None since last CCM contact  Hospital visits:  None in previous 6 months  Medications: Outpatient Encounter Medications as of 05/20/2021  Medication Sig   albuterol (VENTOLIN HFA) 108 (90 Base) MCG/ACT inhaler Inhale 2 puffs into the lungs every 4 (four) hours as needed for wheezing or shortness of breath.   amLODipine (NORVASC) 5 MG tablet Take 1 tablet by mouth daily.   aspirin 81 MG EC tablet Take by mouth.   Continuous Blood Gluc Sensor (FREESTYLE LIBRE 14 DAY SENSOR) MISC SMARTSIG:1 Each Topical Every 2 Weeks   fenofibrate (TRICOR) 145 MG tablet Take 145 mg by mouth daily.   fluticasone (FLONASE) 50 MCG/ACT nasal spray Place 1 spray into both nostrils 2 (two) times daily.   glipiZIDE (GLUCOTROL XL) 2.5 MG 24 hr tablet glipizide ER 2.5 mg tablet, extended release 24 hr   hydrochlorothiazide (HYDRODIURIL) 50 MG tablet Take 1 tablet (50 mg total) by mouth daily.   insulin degludec (TRESIBA) 200 UNIT/ML FlexTouch Pen Inject 80 Units into the skin daily.   levocetirizine (XYZAL) 5 MG tablet TAKE 1 TABLET BY MOUTH ONCE EVERY EVENING FOR ALLERGIES   levothyroxine (SYNTHROID) 100 MCG tablet TAKE 1 TABLET BY MOUTH ONCE A DAY. TAKE ON AN EMPTY STOMACH WITH A GLASS OF WATER ATLEAST 30-60 MIN BEFORE BREAKFAST   metFORMIN (GLUCOPHAGE) 1000 MG tablet TAKE 1 TABLET BY MOUTH TWICE A DAY   NOVOLOG FLEXPEN 100 UNIT/ML FlexPen Inject 28 units before breakfast, inject 24 units beforelunch, inject 34 units before supper. Increase when blood sugar is over 150. Use 2 units more for every 50 over.   ramipril (ALTACE) 10 MG capsule TAKE 1 CAPSULE BY MOUTH 2 TIMES DAILY   rosuvastatin  (CRESTOR) 10 MG tablet Take 1 tablet (10 mg total) by mouth daily. For cholesterol.   TRUEPLUS 5-BEVEL PEN NEEDLES 31G X 6 MM MISC    No facility-administered encounter medications on file as of 05/20/2021.    [name] on 05/20/21 for general disease state and medication adherence call.  Patient is not > 5 days past due for refill on the following medications per chart history:  [facility] Medications: Medication Name/mg Last Fill Days Supply Metformin 1000mg   02/19/21  90 Tresiba 200unit/ml  03/15/21 77 Novolog 100unit/ml  04/11/21 30 Rosuvastatin 10mg   05/02/21 90 Ramipril 10mg    04/18/21 90  Reviewed all of patient's medications and no adherence gaps identified.   What concerns do you have about your medications? The patient just started Trulicity and is happy with his blood sugars.  The patient denies side effects with his medications.   How often do you forget or accidentally miss a dose? Never  Are you having any problems getting your medications from your pharmacy? No - [facility] works well for patient  Has the cost of your medications been a concern? The patient states he will be in a donut hole soon, and commented that he would be in a donut hole after the first quarter of 2023.  Since last visit with CPP, no interventions have been made.  The patient has not had an ED visit since last contact.   The patient denies problems with their health.  he denies  concerns or questions for Debbora Dus, PharmD at this time.   Counseled patient on:  [place] Devoid job taking medications, Importance of taking medication daily without missed doses, and Access to CCM team for any cost, medication or pharmacy concerns. Explained to the patient that we may be able to help with cost of Trulicity in the future if this is going to be a maintenance medication.   Care Gaps: Annual wellness visit in last year? Yes Most Recent BP reading:134/78  104-P  If Diabetic: Most recent  A1C reading: 7.8  04/12/21 Last eye exam / retinopathy screening: 02/23/21 Last diabetic foot exam: 09/02/19  CCM appointment on 06/07/21   Reminded the patient of appointment with Sharyn Lull.  Debbora Dus, CPP notified  Avel Sensor, [place] Assistant [phone]  I have reviewed the care management and care coordination activities outlined in this encounter and I am certifying that I agree with the content of this note. No further action required.  Debbora Dus, PharmD Clinical Pharmacist [facility] at [facility] [phone]

## 2021-05-24 DIAGNOSIS — M25562 Pain in left knee: Secondary | ICD-10-CM | POA: Diagnosis not present

## 2021-05-24 DIAGNOSIS — M25561 Pain in right knee: Secondary | ICD-10-CM | POA: Diagnosis not present

## 2021-05-24 DIAGNOSIS — M25662 Stiffness of left knee, not elsewhere classified: Secondary | ICD-10-CM | POA: Diagnosis not present

## 2021-05-24 DIAGNOSIS — M6281 Muscle weakness (generalized): Secondary | ICD-10-CM | POA: Diagnosis not present

## 2021-05-24 DIAGNOSIS — M25661 Stiffness of right knee, not elsewhere classified: Secondary | ICD-10-CM | POA: Diagnosis not present

## 2021-05-26 DIAGNOSIS — M25561 Pain in right knee: Secondary | ICD-10-CM | POA: Diagnosis not present

## 2021-05-26 DIAGNOSIS — M6281 Muscle weakness (generalized): Secondary | ICD-10-CM | POA: Diagnosis not present

## 2021-05-26 DIAGNOSIS — M25661 Stiffness of right knee, not elsewhere classified: Secondary | ICD-10-CM | POA: Diagnosis not present

## 2021-05-26 DIAGNOSIS — M25562 Pain in left knee: Secondary | ICD-10-CM | POA: Diagnosis not present

## 2021-05-26 DIAGNOSIS — M25662 Stiffness of left knee, not elsewhere classified: Secondary | ICD-10-CM | POA: Diagnosis not present

## 2021-05-27 DIAGNOSIS — J449 Chronic obstructive pulmonary disease, unspecified: Secondary | ICD-10-CM | POA: Diagnosis not present

## 2021-05-27 DIAGNOSIS — G4733 Obstructive sleep apnea (adult) (pediatric): Secondary | ICD-10-CM | POA: Diagnosis not present

## 2021-05-31 DIAGNOSIS — M25562 Pain in left knee: Secondary | ICD-10-CM | POA: Diagnosis not present

## 2021-05-31 DIAGNOSIS — M25561 Pain in right knee: Secondary | ICD-10-CM | POA: Diagnosis not present

## 2021-05-31 DIAGNOSIS — M25661 Stiffness of right knee, not elsewhere classified: Secondary | ICD-10-CM | POA: Diagnosis not present

## 2021-05-31 DIAGNOSIS — M25662 Stiffness of left knee, not elsewhere classified: Secondary | ICD-10-CM | POA: Diagnosis not present

## 2021-05-31 DIAGNOSIS — M6281 Muscle weakness (generalized): Secondary | ICD-10-CM | POA: Diagnosis not present

## 2021-06-02 DIAGNOSIS — M25562 Pain in left knee: Secondary | ICD-10-CM | POA: Diagnosis not present

## 2021-06-02 DIAGNOSIS — M25561 Pain in right knee: Secondary | ICD-10-CM | POA: Diagnosis not present

## 2021-06-02 DIAGNOSIS — M25662 Stiffness of left knee, not elsewhere classified: Secondary | ICD-10-CM | POA: Diagnosis not present

## 2021-06-02 DIAGNOSIS — M6281 Muscle weakness (generalized): Secondary | ICD-10-CM | POA: Diagnosis not present

## 2021-06-02 DIAGNOSIS — M25661 Stiffness of right knee, not elsewhere classified: Secondary | ICD-10-CM | POA: Diagnosis not present

## 2021-06-07 ENCOUNTER — Ambulatory Visit (INDEPENDENT_AMBULATORY_CARE_PROVIDER_SITE_OTHER): Payer: HMO

## 2021-06-07 ENCOUNTER — Other Ambulatory Visit: Payer: Self-pay

## 2021-06-07 DIAGNOSIS — M25561 Pain in right knee: Secondary | ICD-10-CM | POA: Diagnosis not present

## 2021-06-07 DIAGNOSIS — I10 Essential (primary) hypertension: Secondary | ICD-10-CM

## 2021-06-07 DIAGNOSIS — E1165 Type 2 diabetes mellitus with hyperglycemia: Secondary | ICD-10-CM

## 2021-06-07 DIAGNOSIS — M25562 Pain in left knee: Secondary | ICD-10-CM | POA: Diagnosis not present

## 2021-06-07 DIAGNOSIS — M6281 Muscle weakness (generalized): Secondary | ICD-10-CM | POA: Diagnosis not present

## 2021-06-07 DIAGNOSIS — M25661 Stiffness of right knee, not elsewhere classified: Secondary | ICD-10-CM | POA: Diagnosis not present

## 2021-06-07 DIAGNOSIS — E7800 Pure hypercholesterolemia, unspecified: Secondary | ICD-10-CM

## 2021-06-07 DIAGNOSIS — M25662 Stiffness of left knee, not elsewhere classified: Secondary | ICD-10-CM | POA: Diagnosis not present

## 2021-06-07 NOTE — Progress Notes (Signed)
Chronic Care Management Pharmacy Note  06/13/2021 Name:  [name] MRN:  [id number] DOB:  07-20-1950  Summary: CCM follow up today. Discussed HTN, controlled,  HLD, controlled. and DM uncontrolled (A1c 7.8%). His blood sugars have improved on Trulicity with 30 day average of 137. He is doing more exercise since starting PT. He is working on weight loss goal. Per medication review, he continues levothyroxine 100 mcg. Per lab note PCP planned to increase dose to 125 mcg on 04/27/21. Sent note to PCP for follow up plan.  Recommendations/Changes made from today's visit: Continue current medications    Plan: CCM follow up 12 months   Subjective: [name] is an 70 y.o. year old male who is a primary patient of Pleas Koch, NP.  The CCM team was consulted for assistance with disease management and care coordination needs.    Engaged with patient by telephone for follow up visit in response to provider referral for pharmacy case management and/or care coordination services.   Consent to Services:  The patient was given information about Chronic Care Management services, agreed to services, and gave verbal consent prior to initiation of services.  Please see initial visit note for detailed documentation.   Patient Care Team: Pleas Koch, NP as PCP - General (Internal Medicine) Bary Castilla Forest Gleason, MD (General Surgery) Gabriel Carina Betsey Holiday, MD as Physician Assistant (Endocrinology) Debbora Dus, [facility] as Pharmacist (Pharmacist) Gabriel Carina Betsey Holiday, MD as Physician Assistant (Endocrinology)  Recent office visits: 04/27/21 - PCP - Pt presented for wellness visit. Strongly advised he work on weight loss through improvements in diet and physical activity. Per lab notes, increase levothyroxine to 125 mcg. Follow up in 2 months.    Recent consult visits: 04/19/21 - Dr. Gabriel Carina, endocrinology - Pt presented for Diabetes, Adjust Tresiba U200 pen dose to 80 units daily. Continue NovoLog as  you are doing. Let Dr Gabriel Carina know if you are interested in trying Ozempic, Trulicity, and [facility] which are all weekly injectables for diabetes.   Hospital visits: None in previous 6 months  Objective:  Lab Results  Component Value Date   CREATININE 1.1 09/07/2020   BUN 18 09/07/2020   GFR 74.02 06/01/2016   GFRNONAA 76 02/19/2015   GFRAA 88 02/19/2015   NA 137 09/07/2020   K 4.3 09/07/2020   CALCIUM 9.4 09/07/2020   CO2 30 (A) 09/07/2020   GLUCOSE 102 (H) 06/01/2016    Lab Results  Component Value Date/Time   HGBA1C 7.6 09/07/2020 12:00 AM   HGBA1C 7.5 (H) 06/01/2016 07:50 AM   HGBA1C 8.7 11/26/2015 08:55 AM   HGBA1C 8.0 08/27/2015 09:58 AM   GFR 74.02 06/01/2016 07:50 AM   MICROALBUR 1.1 06/01/2016 07:50 AM    Last diabetic Eye exam:  Lab Results  Component Value Date/Time   HMDIABEYEEXA No Retinopathy 02/23/2021 12:00 AM    Last diabetic Foot exam: podiatry visit 03/29/21  Lab Results  Component Value Date   CHOL 89 08/26/2020   HDL 27.80 (L) 08/26/2020   LDLCALC 22 08/26/2020   TRIG 192.0 (H) 08/26/2020   CHOLHDL 3 08/26/2020    Hepatic Function Latest Ref Rng & Units 04/27/2021 06/01/2016 02/19/2015  Total Protein 6.0 - 8.3 g/dL 7.2 7.1 6.9  Albumin 3.5 - 5.2 g/dL 4.6 4.5 4.7  AST 0 - 37 U/L 29 33 22  ALT 0 - 53 U/L 27 43 41  Alk Phosphatase 39 - 117 U/L 47 46 62  Total Bilirubin 0.2 -  1.2 mg/dL 0.5 0.3 0.3  Bilirubin, Direct 0.0 - 0.3 mg/dL 0.1 - -    Lab Results  Component Value Date/Time   TSH 5.88 (H) 04/27/2021 10:08 AM   TSH 3.55 10/26/2020 10:40 AM   FREET4 0.89 07/04/2016 08:44 AM    CBC Latest Ref Rng & Units 02/19/2015 01/12/2012  WBC 3.4 - 10.8 x10E3/uL 8.1 6.9  Hemoglobin 12.6 - 17.7 g/dL 14.2 13.6  Hematocrit 37.5 - 51.0 % 41.3 39.1  Platelets 150 - 379 x10E3/uL 269 193    No results found for: VD25OH  Clinical ASCVD: Yes  The ASCVD Risk score (Arnett DK, et al., 2019) failed to calculate for the following reasons:   The patient  has a prior MI or stroke diagnosis    Depression screen [facility] 2/9 10/26/2020 10/23/2019 01/30/2018  Decreased Interest 0 0 0  Down, Depressed, Hopeless 0 0 0  PHQ - 2 Score 0 0 0  Altered sleeping 0 - -  Tired, decreased energy 1 - -  Change in appetite 1 - -  Feeling bad or failure about yourself  0 - -  Trouble concentrating 0 - -  Moving slowly or fidgety/restless 0 - -  Suicidal thoughts 0 - -  PHQ-9 Score 2 - -    Social History   Tobacco Use  Smoking Status Former   Packs/day: 2.00   Years: 35.00   Pack years: 70.00   Types: Cigarettes, E-cigarettes   Quit date: 12/11/2014   Years since quitting: 6.5  Smokeless Tobacco Never   BP Readings from Last 3 Encounters:  04/27/21 134/78  12/03/20 (!) 150/71  10/26/20 135/70   Pulse Readings from Last 3 Encounters:  04/27/21 (!) 104  12/03/20 88  10/26/20 86   Wt Readings from Last 3 Encounters:  04/27/21 (!) 310 lb (140.6 kg)  12/03/20 (!) 305 lb (138.3 kg)  10/26/20 (!) 305 lb 5 oz (138.5 kg)   BMI Readings from Last 3 Encounters:  04/27/21 44.48 kg/m  12/03/20 43.76 kg/m  10/26/20 43.81 kg/m    Assessment/Interventions: Review of patient past medical history, allergies, medications, health status, including review of consultants reports, laboratory and other test data, was performed as part of comprehensive evaluation and provision of chronic care management services.   SDOH:  (Social Determinants of Health) assessments and interventions performed: Yes  SDOH Screenings   Alcohol Screen: Not on file  Depression (PHQ2-9): Low Risk    PHQ-2 Score: 2  Financial Resource Strain: Not on file  Food Insecurity: Not on file  Housing: Not on file  Physical Activity: Not on file  Social Connections: Not on file  Stress: Not on file  Tobacco Use: Medium Risk   Smoking Tobacco Use: Former   Smokeless Tobacco Use: Never   Passive Exposure: Not on file  Transportation Needs: Not on file    [place]  Allergies   Allergen Reactions   Atorvastatin     Other reaction(s): Headache   Other Other (See Comments)    STATIN DRUG-CAUSED MUSCLE PAIN/LETHARGIC    Medications Reviewed Today     Reviewed by Pleas Koch, NP (Nurse Practitioner) on 04/27/21 at [place] List Status: <None>   Medication Order Taking? Sig Documenting Provider Last Dose Status Informant  albuterol (VENTOLIN HFA) 108 (90 Base) MCG/ACT inhaler [id number]  Inhale 2 puffs into the lungs every 4 (four) hours as needed for wheezing or shortness of breath. Pleas Koch, NP  Active   amLODipine (  NORVASC) 5 MG tablet [id number] Yes Take 1 tablet by mouth daily. [provider] Taking Active   aspirin 81 MG EC tablet [id number] Yes Take by mouth. [provider] Taking Active   Continuous Blood Gluc Sensor (FREESTYLE LIBRE 14 DAY SENSOR) [place] [id number] Yes SMARTSIG:1 Each Topical Every 2 Weeks [provider] Taking Active   fenofibrate (TRICOR) 145 MG tablet [id number] Yes Take 145 mg by mouth daily. [provider] Taking Active   fluticasone (FLONASE) 50 MCG/ACT nasal spray [id number] Yes Place 1 spray into both nostrils 2 (two) times daily. Pleas Koch, NP Taking Active   glipiZIDE (GLUCOTROL XL) 2.5 MG 24 hr tablet [id number] Yes glipizide ER 2.5 mg tablet, extended release 24 hr [provider] Taking Active   hydrochlorothiazide (HYDRODIURIL) 50 MG tablet [id number] Yes Take 1 tablet (50 mg total) by mouth daily. Pleas Koch, NP Taking Active   insulin degludec (TRESIBA) 200 UNIT/ML FlexTouch Pen [id number] Yes Inject 80 Units into the skin daily. [provider] Taking Active Self  levocetirizine (XYZAL) 5 MG tablet [id number] Yes TAKE 1 TABLET BY MOUTH ONCE EVERY EVENING FOR ALLERGIES Pleas Koch, NP Taking Active   levothyroxine (SYNTHROID) 100 MCG tablet [id number] Yes TAKE 1 TABLET BY MOUTH ONCE A DAY. TAKE ON AN EMPTY STOMACH WITH A GLASS OF WATER ATLEAST  30-60 MIN BEFORE BREAKFAST Pleas Koch, NP Taking Active   metFORMIN (GLUCOPHAGE) 1000 MG tablet [id number] Yes TAKE 1 TABLET BY MOUTH TWICE A DAY Chauvin, Robert, PA Taking Active   NOVOLOG FLEXPEN 100 UNIT/ML FlexPen [id number] Yes Inject 28 units before breakfast, inject 24 units beforelunch, inject 34 units before supper. Increase when blood sugar is over 150. Use 2 units more for every 50 over. [provider] Taking Active   ramipril (ALTACE) 10 MG capsule [id number] Yes TAKE 1 CAPSULE BY MOUTH 2 TIMES DAILY Pleas Koch, NP Taking Active   rosuvastatin (CRESTOR) 10 MG tablet [id number] Yes Take 1 tablet (10 mg total) by mouth daily. For cholesterol. Pleas Koch, NP Taking Active   TRUEPLUS 5-BEVEL PEN NEEDLES 31G X 6 MM MISC [id number] Yes  [provider] Taking Active             Patient Active Problem List   Diagnosis Date Noted   Acute maxillary sinusitis 12/05/2020   Aortic atherosclerosis ([place]) 06/16/2020   [place] 01/08/2020   Welcome to Medicare preventive visit 09/25/2017   Personal history of tobacco use, presenting hazards to health 06/30/2016   Preventative health care 06/05/2016   Osteoarthritis of both knees 11/09/2015   Diabetes mellitus ([place]) 06/14/2015   Allergic rhinitis 05/28/2015   Morbid obesity with BMI of 40.0-44.9, adult ([place]) 05/28/2015   Chronic constipation 05/28/2015   Chronic obstructive pulmonary disease ([place]) 05/28/2015   Essential (primary) hypertension 05/28/2015   Hypercholesterolemia 05/28/2015   Gastro-esophageal reflux disease without esophagitis 05/28/2015   Hypothyroidism 05/28/2015   Dermatofibroma of left lower leg 04/01/2015   Former smoker 03/04/2015   History of colonic polyps 08/20/2014   Idiopathic localized osteoarthropathy 03/16/2014    Immunization History  Administered Date(s) Administered   Fluad Quad(high Dose 65+) 04/15/2019, 04/26/2020, 04/27/2021   Influenza Split 04/26/2007    Influenza,inj,Quad PF,6+ Mos 03/18/2015, 05/10/2017, 04/08/2018   Influenza-Unspecified 03/18/2015, 04/16/2016, 05/10/2017   PFIZER(Purple Top)SARS-COV-2 Vaccination 07/19/2020   Pneumococcal Conjugate-13 09/25/2017   Pneumococcal Polysaccharide-23 06/19/2011, 06/05/2016   Tdap 06/19/2011, 04/01/2015   Zoster Recombinat (Shingrix) 04/15/2019, 06/17/2019    Conditions to  be addressed/monitored:  Hypertension, Hyperlipidemia, and Diabetes  Care Plan : [place]  Updates made by Debbora Dus, [facility] since 06/13/2021 12:00 AM     Problem: CHL AMB "PATIENT-SPECIFIC PROBLEM"      Long-Range Goal: Disease Management   Start Date: 06/07/2021  Priority: High  Note:   Current Barriers:  None identified  Pharmacist Clinical Goal(s):  Patient will contact provider office for questions/concerns as evidenced notation of same in electronic health record through collaboration with PharmD and provider.   Interventions: 1:1 collaboration with Pleas Koch, NP regarding development and update of comprehensive plan of care as evidenced by provider attestation and co-signature Inter-disciplinary care team collaboration (see longitudinal plan of care) Comprehensive medication review performed; medication list updated in electronic medical record  Hypertension (BP goal <140/90) -Controlled - most recent clinic and home reading within goal; he is working on weight loss and exercise. Started PT for strengthening.  -Current treatment: Amlodipine 5 mg - 1 tablet daily  HCTZ 50 mg - 1 tablet daily Ramipril 10 mg - 1 tablet twice daily  -Medications previously tried: none   -Current home readings: 138/72, 82 this morning (wrist cuff) -Current exercise habits: pedal bike, 8 minutes twice daily  -Denies hypotensive/hypertensive symptoms -Educated on BP goals and benefits of medications for prevention of heart attack, stroke and kidney damage; -Counseled to monitor BP at home if  symptomatic, document, and provide log at future appointments -Recommended to continue current medication  Hyperlipidemia: (LDL goal < 70) -Controlled - LDL within goal, statin adherence timely  -Current treatment: Rosuvastatin 10 mg - 1 tablet daily Fenofibrate 145 mg - 1 tablet daily -Medications previously tried: none  -Educated on Cholesterol goals;  -Recommended to continue current medication  Diabetes (A1c goal <7%) -Not ideally controlled - Improving, followed by Dr. Gabriel Carina. His average BG for 30 days has improved since starting Trulicity last month, pending updated A1c. Last A1c October 2022 7.8%. - Last endo visit 04/19/21.  -Current medications: Tresiba - Inject 80 units daily Novolog Flexpen - Inject 28 (breakfast) - 24 (lunch) - 34 (supper) units before meals  Metformin 1000 mg - 1 tablet BID Glipizide XL 10 mg - 1 tablet daily Trulicity 1.5 mg - Inject weekly  -Medications previously tried: none reported  -Current home glucose readings - uses [employer] 30-day average glucose: 137 -Denies hypoglycemic/hyperglycemic symptoms -Educated on A1c and blood sugar goals; -Counseled to check feet daily and get yearly eye exams - up to date -Recommended to continue current medication    Other - per lab note review, PCP recommended increase levothyroxine to 125 mcg on 04/27/21. Pt confirms he is still taking levothyroxine 100 mcg. He takes it first thing in morning 30-60 minutes before meal. Will update PCP. See telephone note 06/13/21.  Patient Goals/Self-Care Activities Patient will:  - take medications as prescribed as evidenced by patient report and record review  Follow Up Plan: Telephone follow up appointment with care management team member scheduled for:  12 months  Medication Assistance: None required.  Patient affirms current coverage meets needs.  Compliance/Adherence/Medication fill history: Care Gaps: A1c - in chart per Care Everywhere Foot exam - podiatry  visit 03/29/21  Star-Rating Drugs: Medication Name/mg            Last Fill          Days Supply Metformin 1056m                  05/24/21  90 Rosuvastatin 56m                 05/02/21              90 Ramipril 157m                        04/18/21              90  No gaps in adherence   [facility]one: [phone]: [phone] Plan and Follow Up Patient Decision:  Patient agrees to Care Plan and Follow-up.  MiDebbora DusPharmD Clinical Pharmacist [phone]

## 2021-06-10 DIAGNOSIS — M25561 Pain in right knee: Secondary | ICD-10-CM | POA: Diagnosis not present

## 2021-06-10 DIAGNOSIS — M25662 Stiffness of left knee, not elsewhere classified: Secondary | ICD-10-CM | POA: Diagnosis not present

## 2021-06-10 DIAGNOSIS — M25562 Pain in left knee: Secondary | ICD-10-CM | POA: Diagnosis not present

## 2021-06-10 DIAGNOSIS — M6281 Muscle weakness (generalized): Secondary | ICD-10-CM | POA: Diagnosis not present

## 2021-06-10 DIAGNOSIS — M25661 Stiffness of right knee, not elsewhere classified: Secondary | ICD-10-CM | POA: Diagnosis not present

## 2021-06-13 ENCOUNTER — Telehealth: Payer: Self-pay

## 2021-06-13 DIAGNOSIS — E039 Hypothyroidism, unspecified: Secondary | ICD-10-CM

## 2021-06-13 NOTE — Telephone Encounter (Signed)
Update on patient -   Patient confirms he is still taking levothyroxine 100 mcg daily 30-60 minutes before breakfast. Per lab notes (04/27/21) PCP wanted to increase dose to 125 mcg. Patient also reports he has not had an ultrasound of his thyroid that he is aware of.     Debbora Dus, PharmD Clinical Pharmacist [place] [facility] at [facility] [phone]

## 2021-06-13 NOTE — Patient Instructions (Signed)
Dear [name], Below is a summary of the goals we discussed during our follow up appointment on June 07, 2021. Please contact me anytime with questions or concerns.   Visit Information  Patient Care Plan: CCM Pharmacy Care Plan     Problem Identified: CHL AMB "PATIENT-SPECIFIC PROBLEM"      Long-Range Goal: Disease Management   Start Date: 06/07/2021  Priority: High  Note:   Current Barriers:  None identified  Pharmacist Clinical Goal(s):  Patient will contact provider office for questions/concerns as evidenced notation of same in electronic health record through collaboration with PharmD and provider.   Interventions: 1:1 collaboration with Pleas Koch, NP regarding development and update of comprehensive plan of care as evidenced by provider attestation and co-signature Inter-disciplinary care team collaboration (see longitudinal plan of care) Comprehensive medication review performed; medication list updated in electronic medical record  Hypertension (BP goal <140/90) -Controlled - most recent clinic and home reading within goal; he is working on weight loss and exercise. Started PT for strengthening.  -Current treatment: Amlodipine 5 mg - 1 tablet daily  HCTZ 50 mg - 1 tablet daily Ramipril 10 mg - 1 tablet twice daily  -Medications previously tried: none   -Current home readings: 138/72, 82 this morning (wrist cuff) -Current exercise habits: pedal bike, 8 minutes twice daily  -Denies hypotensive/hypertensive symptoms -Educated on BP goals and benefits of medications for prevention of heart attack, stroke and kidney damage; -Counseled to monitor BP at home if symptomatic, document, and provide log at future appointments -Recommended to continue current medication  Hyperlipidemia: (LDL goal < 70) -Controlled - LDL within goal, statin adherence timely  -Current treatment: Rosuvastatin 10 mg - 1 tablet daily Fenofibrate 145 mg - 1 tablet  daily -Medications previously tried: none  -Educated on Cholesterol goals;  -Recommended to continue current medication  Diabetes (A1c goal <7%) -Not ideally controlled - Improving, followed by Dr. Gabriel Carina. His average BG for 30 days has improved since starting Trulicity last month, pending updated A1c. Last A1c October 2022 7.8%. - Last endo visit 04/19/21.  -Current medications: Tresiba - Inject 80 units daily Novolog Flexpen - Inject 28 (breakfast) - 24 (lunch) - 34 (supper) units before meals  Metformin 1000 mg - 1 tablet BID Glipizide XL 10 mg - 1 tablet daily Trulicity 1.5 mg - Inject weekly  -Medications previously tried: none reported  -Current home glucose readings - uses [employer] 30-day average glucose: 137 -Denies hypoglycemic/hyperglycemic symptoms -Educated on A1c and blood sugar goals; -Counseled to check feet daily and get yearly eye exams - up to date -Recommended to continue current medication      Patient verbalizes understanding of instructions provided today and agrees to view in [place].   Debbora Dus, PharmD Clinical Pharmacist [place] [facility] at [facility] [phone]

## 2021-06-14 MED ORDER — LEVOTHYROXINE SODIUM 125 MCG PO TABS: ORAL_TABLET | ORAL | 0 refills | Status: DC

## 2021-06-14 NOTE — Telephone Encounter (Signed)
Patient will pick up the 125 mcg of levothyroxine. He would like to get ultrasound in [place]. I have made lab appointment for patient as well.

## 2021-06-14 NOTE — Telephone Encounter (Signed)
Noted, it looks like he never responded to my message via [place].  Please call notify patient that I will send in levothyroxine 125 mcg to pharmacy. Stop taking 100 mcg dose.   I recommend we proceed with thyroid ultrasound. If he's agreeable I will place orders for outpatient imaging. [place]? [place]?  Also needs repeat labs in 2 months for thyroid testing, lab only appt.

## 2021-06-14 NOTE — Telephone Encounter (Signed)
Noted, thyroid ultrasound ordered.

## 2021-06-15 DIAGNOSIS — I10 Essential (primary) hypertension: Secondary | ICD-10-CM | POA: Diagnosis not present

## 2021-06-15 DIAGNOSIS — Z794 Long term (current) use of insulin: Secondary | ICD-10-CM | POA: Diagnosis not present

## 2021-06-15 DIAGNOSIS — E7800 Pure hypercholesterolemia, unspecified: Secondary | ICD-10-CM | POA: Diagnosis not present

## 2021-06-15 DIAGNOSIS — E1165 Type 2 diabetes mellitus with hyperglycemia: Secondary | ICD-10-CM

## 2021-06-16 NOTE — Telephone Encounter (Signed)
See patients daughters note about the ultrasound. They are wanting to check with Endo first

## 2021-06-20 ENCOUNTER — Other Ambulatory Visit: Payer: Self-pay | Admitting: Primary Care

## 2021-06-20 ENCOUNTER — Telehealth: Payer: Self-pay

## 2021-06-20 DIAGNOSIS — J309 Allergic rhinitis, unspecified: Secondary | ICD-10-CM

## 2021-06-20 NOTE — Telephone Encounter (Signed)
Per Kate's request have called both numbers for patient. Need to find out when he is available for call from Anda Kraft to review few questions.

## 2021-06-20 NOTE — Telephone Encounter (Signed)
Patient returned office phone call. 

## 2021-06-21 NOTE — Telephone Encounter (Signed)
Left message to return call to our office.  When patient calls please see message below and get time and day he will be able to take call.

## 2021-06-21 NOTE — Telephone Encounter (Signed)
[phone]  Is the number that he would like to have you call.he has not been answering because of the way it comes up. He will be free to take your call tomorrow after clinic. Around 330 or 4. Let me know before you call we have worked out a way to know it is you so he will answer.

## 2021-06-22 NOTE — Telephone Encounter (Signed)
Noted. Spoke with patient via phone.  Notified him that Jens Som is not on his DPR and that she will need to be added in order to continue conversations via [place]. He will have her added during his next visit.  Nothing further needed.

## 2021-06-29 ENCOUNTER — Telehealth: Payer: Self-pay | Admitting: *Deleted

## 2021-06-29 DIAGNOSIS — Z87891 Personal history of nicotine dependence: Secondary | ICD-10-CM

## 2021-06-29 NOTE — Addendum Note (Signed)
Addended by: Doroteo Glassman D on: 06/29/2021 04:14 PM   Modules accepted: Orders

## 2021-06-29 NOTE — Telephone Encounter (Signed)
Pt left message requesting appt for lung cancer screening. Last low dose CT was 07/02/2020.

## 2021-06-29 NOTE — Telephone Encounter (Signed)
Spoke with pt and scheduled yearly lung screening CT for 07/21/21 9:30. Pt verbalized understanding.

## 2021-07-01 ENCOUNTER — Encounter: Payer: Self-pay | Admitting: Podiatry

## 2021-07-01 ENCOUNTER — Other Ambulatory Visit: Payer: Self-pay

## 2021-07-01 ENCOUNTER — Ambulatory Visit: Payer: HMO | Admitting: Podiatry

## 2021-07-01 DIAGNOSIS — M79675 Pain in left toe(s): Secondary | ICD-10-CM | POA: Diagnosis not present

## 2021-07-01 DIAGNOSIS — S90221A Contusion of right lesser toe(s) with damage to nail, initial encounter: Secondary | ICD-10-CM

## 2021-07-01 DIAGNOSIS — L84 Corns and callosities: Secondary | ICD-10-CM | POA: Diagnosis not present

## 2021-07-01 DIAGNOSIS — Z794 Long term (current) use of insulin: Secondary | ICD-10-CM

## 2021-07-01 DIAGNOSIS — M2042 Other hammer toe(s) (acquired), left foot: Secondary | ICD-10-CM

## 2021-07-01 DIAGNOSIS — M79674 Pain in right toe(s): Secondary | ICD-10-CM | POA: Diagnosis not present

## 2021-07-01 DIAGNOSIS — M2041 Other hammer toe(s) (acquired), right foot: Secondary | ICD-10-CM

## 2021-07-01 DIAGNOSIS — B351 Tinea unguium: Secondary | ICD-10-CM

## 2021-07-01 DIAGNOSIS — E119 Type 2 diabetes mellitus without complications: Secondary | ICD-10-CM

## 2021-07-01 NOTE — Progress Notes (Signed)
ANNUAL DIABETIC FOOT EXAM  Subjective: [name] presents today for for annual diabetic foot examination.  Patient relates 37 year h/o diabetes.  Patient denies any h/o foot wounds.  Patient has intermittent symptoms of foot numbness.  Patient's blood sugar was 94 mg/dl today.   Pleas Koch, NP is patient's PCP. Last visit was 04/27/2021.  Past Medical History:  Diagnosis Date   Adenomatous polyp    Aortic atherosclerosis ([place]) 06/16/2020   Arthritis    knee   Chronic obstructive lung disease ([place])    Colon polyp 2008   COPD with emphysema ([place])    Diabetes mellitus    Emphysema lung ([place]) 06/16/2020   Emphysema lung ([place])    Hypercholesteremia    Hyperlipemia    Hypertension    Hypothyroidism    Observed sleep apnea    Sleep apnea    Statin myopathy    Patient Active Problem List   Diagnosis Date Noted   Acute maxillary sinusitis 12/05/2020   Aortic atherosclerosis ([place]) 06/16/2020   [place] 01/08/2020   Welcome to Medicare preventive visit 09/25/2017   Personal history of tobacco use, presenting hazards to health 06/30/2016   Preventative health care 06/05/2016   Osteoarthritis of both knees 11/09/2015   Diabetes mellitus ([place]) 06/14/2015   Allergic rhinitis 05/28/2015   Morbid obesity with BMI of 40.0-44.9, adult ([place]) 05/28/2015   Chronic constipation 05/28/2015   Chronic obstructive pulmonary disease ([place]) 05/28/2015   Essential (primary) hypertension 05/28/2015   Hypercholesterolemia 05/28/2015   Gastro-esophageal reflux disease without esophagitis 05/28/2015   Hypothyroidism 05/28/2015   Dermatofibroma of left lower leg 04/01/2015   Former smoker 03/04/2015   History of colonic polyps 08/20/2014   Idiopathic localized osteoarthropathy 03/16/2014   Past Surgical History:  Procedure Laterality Date   COLONOSCOPY  2008, 2016   Dr Bary Castilla   COLONOSCOPY WITH PROPOFOL N/A 10/03/2017   Procedure: COLONOSCOPY WITH PROPOFOL;  Surgeon: Robert Bellow, MD;  Location: [facility] ENDOSCOPY;  Service: Endoscopy;  Laterality: N/A;   COLONOSCOPY WITH PROPOFOL N/A 10/08/2020   Procedure: COLONOSCOPY WITH PROPOFOL;  Surgeon: Robert Bellow, MD;  Location: [facility] ENDOSCOPY;  Service: Endoscopy;  Laterality: N/A;   DERMATOFIBROMA  03/26/15   Procedure: Excision mass left leg;  Surgeon: Robert Bellow, MD;  Location: [facility] ORS;  Service: General;  Laterality: N/A;   LIPOMA EXCISION N/A 03/26/2015       TONSILLECTOMY     Current Outpatient Medications on File Prior to Visit  Medication Sig Dispense Refill   albuterol (VENTOLIN HFA) 108 (90 Base) MCG/ACT inhaler Inhale 2 puffs into the lungs every 4 (four) hours as needed for wheezing or shortness of breath. 1 each 0   amLODipine (NORVASC) 5 MG tablet Take 1 tablet by mouth daily.     aspirin 81 MG EC tablet Take by mouth.     Continuous Blood Gluc Sensor (FREESTYLE LIBRE 14 DAY SENSOR) MISC SMARTSIG:1 Each Topical Every 2 Weeks     fenofibrate (TRICOR) 145 MG tablet Take 145 mg by mouth daily.     fluticasone (FLONASE) 50 MCG/ACT nasal spray Place 1 spray into both nostrils 2 (two) times daily. 16 g 0   glipiZIDE (GLUCOTROL XL) 2.5 MG 24 hr tablet glipizide ER 2.5 mg tablet, extended release 24 hr     hydrochlorothiazide (HYDRODIURIL) 50 MG tablet Take 1 tablet (50 mg total) by mouth daily. 90 tablet 1   insulin degludec (TRESIBA) 200 UNIT/ML FlexTouch Pen Inject 80 Units into  the skin daily.     levocetirizine (XYZAL) 5 MG tablet TAKE 1 TABLET BY MOUTH ONCE EVERY EVENING FOR ALLERGIES 90 tablet 2   levothyroxine (SYNTHROID) 125 MCG tablet Take 1 tablet by mouth every morning on an empty stomach with water only.  No food or other medications for 30 minutes. 90 tablet 0   metFORMIN (GLUCOPHAGE) 1000 MG tablet TAKE 1 TABLET BY MOUTH TWICE A DAY 180 tablet 1   NOVOLOG FLEXPEN 100 UNIT/ML FlexPen Inject 28 units before breakfast, inject 24 units beforelunch, inject 34 units before supper. Increase when  blood sugar is over 150. Use 2 units more for every 50 over.  9   ramipril (ALTACE) 10 MG capsule TAKE 1 CAPSULE BY MOUTH 2 TIMES DAILY 180 capsule 1   rosuvastatin (CRESTOR) 10 MG tablet Take 1 tablet (10 mg total) by mouth daily. For cholesterol. 90 tablet 3   TRUEPLUS 5-BEVEL PEN NEEDLES 31G X 6 MM MISC      TRULICITY 1.5 TK/3.5WS SOPN Inject 1.5 mg as directed once a week.     No current facility-administered medications on file prior to visit.    Allergies  Allergen Reactions   Atorvastatin     Other reaction(s): Headache   Other Other (See Comments)    STATIN DRUG-CAUSED MUSCLE PAIN/LETHARGIC   Social History   Occupational History   Not on file  Tobacco Use   Smoking status: Former    Packs/day: 2.00    Years: 35.00    Pack years: 70.00    Types: Cigarettes, E-cigarettes    Quit date: 12/11/2014    Years since quitting: 6.5   Smokeless tobacco: Never  Vaping Use   Vaping Use: Never used  Substance and Sexual Activity   Alcohol use: No    Alcohol/week: 0.0 standard drinks   Drug use: No   Sexual activity: Not on file   Family History  Problem Relation Age of Onset   Stroke Mother    Diabetes Mother    Stroke Father    Diabetes Other    Other Daughter 94       precancerous colon polyp    Diabetes Maternal Grandmother    Diabetes Paternal Grandmother    Immunization History  Administered Date(s) Administered   Fluad Quad(high Dose 65+) 04/15/2019, 04/26/2020, 04/27/2021   Influenza Split 04/26/2007   Influenza,inj,Quad PF,6+ Mos 03/18/2015, 05/10/2017, 04/08/2018   Influenza-Unspecified 03/18/2015, 04/16/2016, 05/10/2017   PFIZER Comirnaty(Gray Top)Covid-19 Tri-Sucrose Vaccine 09/16/2019, 10/07/2019   PFIZER(Purple Top)SARS-COV-2 Vaccination 10/07/2019, 07/19/2020   Pneumococcal Conjugate-13 09/25/2017   Pneumococcal Polysaccharide-23 06/19/2011, 06/05/2016   Tdap 06/19/2011, 04/01/2015   Zoster Recombinat (Shingrix) 04/15/2019, 06/17/2019     Review of  Systems: Negative except as noted in the HPI.   Objective: There were no vitals filed for this visit.  [name] is a pleasant 70 y.o. male in NAD. AAO X 3.  Vascular Examination: CFT <3 seconds b/l LE. Faintly palpable DP pulses b/l LE. Faintly palpable PT pulse(s) b/l LE. Pedal hair absent. No pain with calf compression b/l. No edema noted b/l LE. No ischemia or gangrene noted b/l LE. No cyanosis or clubbing noted b/l LE.  Dermatological Examination: Pedal integument with normal turgor, texture and tone BLE. No open wounds b/l LE. No interdigital macerations noted b/l LE. Toenails 1-5 left, R hallux, R 2nd toe, R 3rd toe, and R 4th toe elongated, discolored, dystrophic, thickened, and crumbly with subungual debris and tenderness to dorsal palpation. There is evidence  of subacute subungual hematoma of the R 5th toe. There is onycholysis of nailplate. There is no  tenderness to palpation. Hyperkeratotic lesion(s) submet head 3 right foot and submet head 5 right foot.  No erythema, no edema, no drainage, no fluctuance.  Musculoskeletal Examination: Muscle strength 5/5 to all lower extremity muscle groups bilaterally. No pain, crepitus or joint limitation noted with ROM bilateral LE. Hammertoe deformity noted 1-5 b/l. Utilizes cane for ambulation assistance.  Footwear Assessment: Does the patient wear appropriate shoes? Yes. Does the patient need inserts/orthotics? Yes.  Neurological Examination: Pt has subjective symptoms of neuropathy. Protective sensation intact 5/5 intact bilaterally with 10g monofilament b/l.  Hemoglobin A1C 09/07/2020  HGBA1C 7.6  Some recent data might be hidden   Assessment: 1. Pain due to onychomycosis of toenails of both feet   2. Callus   3. Acquired hammertoes of both feet   4. Subungual hematoma of fifth toe of right foot, initial encounter   5. Type 2 diabetes mellitus without complication, with long-term current use of insulin ([place])   6. Encounter for  diabetic foot exam ([place])      ADA Risk Categorization: Low Risk :  Patient has all of the following: Intact protective sensation No prior foot ulcer  No severe deformity Pedal pulses present  Plan: -Examined patient. -Diabetic foot examination performed today. -Continue foot and shoe inspections daily. Monitor blood glucose per PCP/Endocrinologist's recommendations. -Patient to continue soft, supportive shoe gear daily. Start procedure for diabetic shoes. Patient qualifies based on diagnoses. -Patient to schedule appointment with Pedorthist for diabetic shoe measurements. -Mycotic toenails 1-5 left, R hallux, R 2nd toe, R 3rd toe, and R 4th toe were debrided in length and girth with sterile nail nippers and dremel without iatrogenic bleeding. -Loose nailplate R 5th toe gently debrided entoto with no underlying nailbed injury. Digit cleansed with alcohol. No further treatment required by patient. -Callus(es) submet head 3 right foot and submet head 5 right foot pared utilizing sterile scalpel blade without complication or incident. Total number debrided =2. -Patient/POA to call should there be question/concern in the interim.  Return in about 3 months (around 09/29/2021).  Marzetta Board, DPM

## 2021-07-21 ENCOUNTER — Ambulatory Visit
Admission: RE | Admit: 2021-07-21 | Discharge: 2021-07-21 | Disposition: A | Discharge status: Home / Self Care | Payer: HMO | Source: Ambulatory Visit | Attending: Acute Care | Admitting: Acute Care

## 2021-07-21 ENCOUNTER — Other Ambulatory Visit: Payer: Self-pay | Admitting: Acute Care

## 2021-07-21 ENCOUNTER — Other Ambulatory Visit: Payer: Self-pay

## 2021-07-21 DIAGNOSIS — Z87891 Personal history of nicotine dependence: Secondary | ICD-10-CM

## 2021-07-22 ENCOUNTER — Ambulatory Visit: Payer: HMO

## 2021-07-26 ENCOUNTER — Ambulatory Visit: Payer: HMO

## 2021-07-26 ENCOUNTER — Other Ambulatory Visit: Payer: Self-pay

## 2021-07-26 DIAGNOSIS — Z794 Long term (current) use of insulin: Secondary | ICD-10-CM

## 2021-07-26 DIAGNOSIS — M2041 Other hammer toe(s) (acquired), right foot: Secondary | ICD-10-CM

## 2021-07-26 DIAGNOSIS — M2042 Other hammer toe(s) (acquired), left foot: Secondary | ICD-10-CM

## 2021-07-26 NOTE — Progress Notes (Signed)
SITUATION Reason for Consult: Evaluation for Prefabricated Diabetic Shoes and Bilateral Custom Diabetic Inserts. Patient / Caregiver Report: Patient would like comfortable shoes  OBJECTIVE DATA: Patient History / Diagnosis:    ICD-10-CM   1. Type 2 diabetes mellitus without complication, with long-term current use of insulin (HCC)  E11.9    Z79.4     2. Acquired hammertoes of both feet  M20.41    M20.42       Presence of Diabetic Complications: - Peripheral Neuropathy  Current or Previous Devices: Orthofeet and [id number] inserts  In-Person Foot Examination:  Skin presentation:   Thin, Shiny, Hairless Nail presentation:   Thick, Ingrown, With Fungus Ulcers & Callousing:   None  Toe / Foot Deformities:   Hammertoes, pes planus  Shoe Size: 12XW  ORTHOTIC RECOMMENDATION Recommended Devices: - 1x pair prefabricated PDAC approved diabetic shoes: [id number] 12XW - 3x pair custom-to-patient direct CAM carved diabetic insoles.   GOALS OF SHOES AND INSOLES - Reduce shear and pressure - Reduce / Prevent callus formation - Reduce / Prevent ulceration - Protect the fragile healing compromised diabetic foot.  Patient would benefit from diabetic shoes and inserts as patient has diabetes mellitus and the patient has one or more of the following conditions: - History of partial or complete amputation of the foot - History of previous foot ulceration. - History of pre-ulcerative callus - Peripheral neuropathy with evidence of callus formation - Foot deformity - Poor circulation  ACTIONS PERFORMED Patient was casted for insoles via crush box and measured for shoes via brannock device. Procedure was explained and patient tolerated procedure well. All questions were answered and concerns addressed.  PLAN Insurance to be verified and out of pocket cost communicated to patient. Once cost verified and agreed upon and diabetic certification received, casts are to be sent to [facility] for  fabrication. Patient is to be called for fitting when devices are ready.

## 2021-08-08 ENCOUNTER — Other Ambulatory Visit: Payer: Self-pay | Admitting: Primary Care

## 2021-08-08 DIAGNOSIS — E7800 Pure hypercholesterolemia, unspecified: Secondary | ICD-10-CM

## 2021-08-08 DIAGNOSIS — I2510 Atherosclerotic heart disease of native coronary artery without angina pectoris: Secondary | ICD-10-CM

## 2021-08-15 ENCOUNTER — Other Ambulatory Visit: Payer: HMO

## 2021-08-16 DIAGNOSIS — E1159 Type 2 diabetes mellitus with other circulatory complications: Secondary | ICD-10-CM | POA: Diagnosis not present

## 2021-08-25 DIAGNOSIS — J449 Chronic obstructive pulmonary disease, unspecified: Secondary | ICD-10-CM | POA: Diagnosis not present

## 2021-08-25 DIAGNOSIS — G4733 Obstructive sleep apnea (adult) (pediatric): Secondary | ICD-10-CM | POA: Diagnosis not present

## 2021-09-02 DIAGNOSIS — E039 Hypothyroidism, unspecified: Secondary | ICD-10-CM | POA: Diagnosis not present

## 2021-09-02 DIAGNOSIS — E1142 Type 2 diabetes mellitus with diabetic polyneuropathy: Secondary | ICD-10-CM | POA: Diagnosis not present

## 2021-09-02 DIAGNOSIS — Z794 Long term (current) use of insulin: Secondary | ICD-10-CM | POA: Diagnosis not present

## 2021-09-02 DIAGNOSIS — E1159 Type 2 diabetes mellitus with other circulatory complications: Secondary | ICD-10-CM | POA: Diagnosis not present

## 2021-09-02 DIAGNOSIS — E669 Obesity, unspecified: Secondary | ICD-10-CM | POA: Diagnosis not present

## 2021-09-02 DIAGNOSIS — I10 Essential (primary) hypertension: Secondary | ICD-10-CM | POA: Diagnosis not present

## 2021-09-02 DIAGNOSIS — E1169 Type 2 diabetes mellitus with other specified complication: Secondary | ICD-10-CM | POA: Diagnosis not present

## 2021-09-22 ENCOUNTER — Telehealth: Payer: Self-pay

## 2021-09-22 NOTE — Telephone Encounter (Signed)
Casts sent to [employer] ?

## 2021-09-22 NOTE — Telephone Encounter (Signed)
Shoes Ordered - [id number] 12XW ?

## 2021-10-04 ENCOUNTER — Encounter: Payer: Self-pay | Admitting: Podiatry

## 2021-10-04 ENCOUNTER — Other Ambulatory Visit: Payer: Self-pay

## 2021-10-04 ENCOUNTER — Ambulatory Visit: Payer: HMO | Admitting: Podiatry

## 2021-10-04 DIAGNOSIS — L84 Corns and callosities: Secondary | ICD-10-CM | POA: Diagnosis not present

## 2021-10-04 DIAGNOSIS — Z794 Long term (current) use of insulin: Secondary | ICD-10-CM | POA: Diagnosis not present

## 2021-10-04 DIAGNOSIS — M79674 Pain in right toe(s): Secondary | ICD-10-CM

## 2021-10-04 DIAGNOSIS — B351 Tinea unguium: Secondary | ICD-10-CM

## 2021-10-04 DIAGNOSIS — E119 Type 2 diabetes mellitus without complications: Secondary | ICD-10-CM | POA: Diagnosis not present

## 2021-10-04 DIAGNOSIS — M79675 Pain in left toe(s): Secondary | ICD-10-CM

## 2021-10-09 NOTE — Progress Notes (Signed)
?  Subjective:  ?Patient ID: [name], male    DOB: 24-Nov-1950,  MRN: [id number] ? ?[name] presents to clinic today for preventative diabetic foot care and callus(es) right foot and painful thick toenails that are difficult to trim. Painful toenails interfere with ambulation. Aggravating factors include wearing enclosed shoe gear. Pain is relieved with periodic professional debridement. Painful calluses are aggravated when weightbearing with and without shoegear. Pain is relieved with periodic professional debridement. ? ?Patient states blood glucose was 94 mg/dl today.  Last HgA1c was 7.2%. ? ?New problem(s): None.  ? ?PCP is Pleas Koch, NP , and last visit was April 27, 2021. ? ?Allergies  ?Allergen Reactions  ? Atorvastatin   ?  Other reaction(s): Headache  ? Other Other (See Comments)  ?  STATIN DRUG-CAUSED MUSCLE PAIN/LETHARGIC  ? ? ?Review of Systems: Negative except as noted in the HPI. ? ?Objective: No changes noted in today's physical examination. ?[name] is a pleasant 71 y.o. male in NAD. AAO X 3. ? ?Vascular Examination: ?CFT <3 seconds b/l LE. Faintly palpable DP pulses b/l LE. Faintly palpable PT pulse(s) b/l LE. Pedal hair absent. No pain with calf compression b/l. No edema noted b/l LE. No ischemia or gangrene noted b/l LE. No cyanosis or clubbing noted b/l LE. ? ?Dermatological Examination: ?Pedal integument with normal turgor, texture and tone BLE. No open wounds b/l LE. No interdigital macerations noted b/l LE. Toenails 1-5 bilaterally elongated, discolored, dystrophic, thickened, and crumbly with subungual debris and tenderness to dorsal palpation. Hyperkeratotic lesion(s) submet head 3 right foot and submet head 5 right foot.  No erythema, no edema, no drainage, no fluctuance. ? ?Musculoskeletal Examination: ?Muscle strength 5/5 to all lower extremity muscle groups bilaterally. No pain, crepitus or joint limitation noted with ROM bilateral LE. Hammertoe deformity noted  1-5 b/l. Utilizes cane for ambulation assistance. ? ?Neurological Examination: ?Pt has subjective symptoms of neuropathy. Protective sensation intact 5/5 intact bilaterally with 10g monofilament b/l. ? ?Assessment/Plan: ?1. Pain due to onychomycosis of toenails of both feet   ?2. Callus   ?3. Type 2 diabetes mellitus without complication, with long-term current use of insulin ([place])   ?  ?-Patient was evaluated and treated. All patient's and/or POA's questions/concerns answered on today's visit. ?-Toenails 1-5 b/l were debrided in length and girth with sterile nail nippers and dremel without iatrogenic bleeding.  ?-Callus(es) submet head 3 right foot and submet head 5 right foot pared utilizing sterile scalpel blade without complication or incident. Total number debrided =2. ?-Patient/POA to call should there be question/concern in the interim.  ? ?Return in about 3 months (around 01/04/2022). ? ?Marzetta Board, DPM  ?

## 2021-10-14 ENCOUNTER — Telehealth: Payer: Self-pay

## 2021-10-14 ENCOUNTER — Ambulatory Visit (INDEPENDENT_AMBULATORY_CARE_PROVIDER_SITE_OTHER): Payer: HMO

## 2021-10-14 VITALS — Wt 310.0 lb

## 2021-10-14 DIAGNOSIS — Z0000 Encounter for general adult medical examination without abnormal findings: Secondary | ICD-10-CM

## 2021-10-14 NOTE — Patient Instructions (Addendum)
[name] , ?Thank you for taking time to come for your Medicare Wellness Visit. I appreciate your ongoing commitment to your health goals. Please review the following plan we discussed and let me know if I can assist you in the future.  ? ?Screening recommendations/referrals: ?Colonoscopy: Done 10/08/2020 - repeat in 3 years ?Recommended yearly ophthalmology/optometry visit for glaucoma screening and checkup ?Recommended yearly dental visit for hygiene and checkup ? ?Vaccinations: ?Influenza vaccine: Done 04/27/2021 - Repeat annually  ?Pneumococcal vaccine: Done 06/05/2016 & 09/25/2017 ?Tdap vaccine: Done 04/01/2015 - Repeat in 10 years ?Shingles vaccine: Done 04/15/2019 & 06/17/2019   ?Covid-19: Done 09/16/2019, 10/07/2019, & 07/19/2020 ? ?Advanced directives: Advance directive discussed with you today. Even though you declined this today, please call our office should you change your mind, and we can give you the proper paperwork for you to fill out.  ? ?Conditions/risks identified: Aim for 30 minutes of exercise or brisk walking, 6-8 glasses of water, and 5 servings of fruits and vegetables each day.  ? ?Next appointment: Follow up in one year for your annual wellness visit.  ? ?Preventive Care 69 Years and Older, Male ? ?Preventive care refers to lifestyle choices and visits with your health care provider that can promote health and wellness. ?What does preventive care include? ?A yearly physical exam. This is also called an annual well check. ?Dental exams once or twice a year. ?Routine eye exams. Ask your health care provider how often you should have your eyes checked. ?Personal lifestyle choices, including: ?Daily care of your teeth and gums. ?Regular physical activity. ?Eating a healthy diet. ?Avoiding tobacco and drug use. ?Limiting alcohol use. ?Practicing safe sex. ?Taking low doses of aspirin every day. ?Taking vitamin and mineral supplements as recommended by your health care provider. ?What happens during an  annual well check? ?The services and screenings done by your health care provider during your annual well check will depend on your age, overall health, lifestyle risk factors, and family history of disease. ?Counseling  ?Your health care provider may ask you questions about your: ?Alcohol use. ?Tobacco use. ?Drug use. ?Emotional well-being. ?Home and relationship well-being. ?Sexual activity. ?Eating habits. ?History of falls. ?Memory and ability to understand (cognition). ?Work and work Statistician. ?Screening  ?You may have the following tests or measurements: ?Height, weight, and BMI. ?Blood pressure. ?Lipid and cholesterol levels. These may be checked every 5 years, or more frequently if you are over 69 years old. ?Skin check. ?Lung cancer screening. You may have this screening every year starting at age 69 if you have a 30-pack-year history of smoking and currently smoke or have quit within the past 15 years. ?Fecal occult blood test (FOBT) of the stool. You may have this test every year starting at age 69. ?Flexible sigmoidoscopy or colonoscopy. You may have a sigmoidoscopy every 5 years or a colonoscopy every 10 years starting at age 69. ?Prostate cancer screening. Recommendations will vary depending on your family history and other risks. ?Hepatitis C blood test. ?Hepatitis B blood test. ?Sexually transmitted disease (STD) testing. ?Diabetes screening. This is done by checking your blood sugar (glucose) after you have not eaten for a while (fasting). You may have this done every 1-3 years. ?Abdominal aortic aneurysm (AAA) screening. You may need this if you are a current or former smoker. ?Osteoporosis. You may be screened starting at age 69 if you are at high risk. ?Talk with your health care provider about your test results, treatment options, and if necessary, the  need for more tests. ?Vaccines  ?Your health care provider may recommend certain vaccines, such as: ?Influenza vaccine. This is recommended  every year. ?Tetanus, diphtheria, and acellular pertussis (Tdap, Td) vaccine. You may need a Td booster every 10 years. ?Zoster vaccine. You may need this after age 69. ?Pneumococcal 13-valent conjugate (PCV13) vaccine. One dose is recommended after age 69. ?Pneumococcal polysaccharide (PPSV23) vaccine. One dose is recommended after age 72. ?Talk to your health care provider about which screenings and vaccines you need and how often you need them. ?This information is not intended to replace advice given to you by your health care provider. Make sure you discuss any questions you have with your health care provider. ?Document Released: 07/30/2015 Document Revised: 03/22/2016 Document Reviewed: 05/04/2015 ?Elsevier Interactive Patient Education ? 2017 [place]. ? ?Fall Prevention in the Home ?Falls can cause injuries. They can happen to people of all ages. There are many things you can do to make your home safe and to help prevent falls. ?What can I do on the outside of my home? ?Regularly fix the edges of walkways and driveways and fix any cracks. ?Remove anything that might make you trip as you walk through a door, such as a raised step or threshold. ?Trim any bushes or trees on the path to your home. ?Use bright outdoor lighting. ?Clear any walking paths of anything that might make someone trip, such as rocks or tools. ?Regularly check to see if handrails are loose or broken. Make sure that both sides of any steps have handrails. ?Any raised decks and porches should have guardrails on the edges. ?Have any leaves, snow, or ice cleared regularly. ?Use sand or salt on walking paths during winter. ?Clean up any spills in your garage right away. This includes oil or grease spills. ?What can I do in the bathroom? ?Use night lights. ?Install grab bars by the toilet and in the tub and shower. Do not use towel bars as grab bars. ?Use non-skid mats or decals in the tub or shower. ?If you need to sit down in the shower,  use a plastic, non-slip stool. ?Keep the floor dry. Clean up any water that spills on the floor as soon as it happens. ?Remove soap buildup in the tub or shower regularly. ?Attach bath mats securely with double-sided non-slip rug tape. ?Do not have throw rugs and other things on the floor that can make you trip. ?What can I do in the bedroom? ?Use night lights. ?Make sure that you have a light by your bed that is easy to reach. ?Do not use any sheets or blankets that are too big for your bed. They should not hang down onto the floor. ?Have a firm chair that has side arms. You can use this for support while you get dressed. ?Do not have throw rugs and other things on the floor that can make you trip. ?What can I do in the kitchen? ?Clean up any spills right away. ?Avoid walking on wet floors. ?Keep items that you use a lot in easy-to-reach places. ?If you need to reach something above you, use a strong step stool that has a grab bar. ?Keep electrical cords out of the way. ?Do not use floor polish or wax that makes floors slippery. If you must use wax, use non-skid floor wax. ?Do not have throw rugs and other things on the floor that can make you trip. ?What can I do with my stairs? ?Do not leave any items on the  stairs. ?Make sure that there are handrails on both sides of the stairs and use them. Fix handrails that are broken or loose. Make sure that handrails are as long as the stairways. ?Check any carpeting to make sure that it is firmly attached to the stairs. Fix any carpet that is loose or worn. ?Avoid having throw rugs at the top or bottom of the stairs. If you do have throw rugs, attach them to the floor with carpet tape. ?Make sure that you have a light switch at the top of the stairs and the bottom of the stairs. If you do not have them, ask someone to add them for you. ?What else can I do to help prevent falls? ?Wear shoes that: ?Do not have high heels. ?Have rubber bottoms. ?Are comfortable and fit you  well. ?Are closed at the toe. Do not wear sandals. ?If you use a stepladder: ?Make sure that it is fully opened. Do not climb a closed stepladder. ?Make sure that both sides of the stepladder are locked in

## 2021-10-14 NOTE — Progress Notes (Signed)
? ?Subjective:  ? [name] is a 71 y.o. male who presents for Medicare Annual/Subsequent preventive examination. ? ?Virtual Visit via Telephone Note ? ?I connected with  [name] on 10/14/21 at  1:15 PM EDT by telephone and verified that I am speaking with the correct person using two identifiers. ? ?Location: ?Patient: Home ?Provider: [facility] ?Persons participating in the virtual visit: patient/Nurse Health Advisor ?  ?I discussed the limitations, risks, security and privacy concerns of performing an evaluation and management service by telephone and the availability of in person appointments. The patient expressed understanding and agreed to proceed. ? ?Interactive audio and video telecommunications were attempted between this nurse and patient, however failed, due to patient having technical difficulties OR patient did not have access to video capability.  We continued and completed visit with audio only. ? ?Some vital signs may be absent or patient reported.  ? ?Juan Storer Dionne Ano, LPN  ? ?Review of Systems    ? ?Cardiac Risk Factors include: advanced age (>35mn, >>41women);diabetes mellitus;dyslipidemia;hypertension;male gender;obesity (BMI >30kg/m2);sedentary lifestyle;smoking/ tobacco exposure;Other (see comment), Risk factor comments: atherosclerosis, COPD ? ?   ?Objective:  ?  ?Today's Vitals  ? 10/14/21 1315  ?Weight: (!) 310 lb (140.6 kg)  ?PainSc: 7   ? ?Body mass index is 44.48 kg/m?. ? ? ?  10/14/2021  ?  1:32 PM 10/08/2020  ?  7:41 AM 10/23/2019  ?  3:23 PM 10/03/2017  ?  7:46 AM 08/27/2015  ?  9:51 AM 06/14/2015  ?  8:09 AM 06/04/2015  ? 10:49 AM  ?Advanced Directives  ?Does Patient Have a Medical Advance Directive? No No No No No No No  ?Would patient like information on creating a medical advance directive? No - Patient declined  Yes (MAU/Ambulatory/Procedural Areas - Information given)      ? ? ?Current Medications (verified) ?Outpatient Encounter Medications as of 10/14/2021   ?Medication Sig  ? albuterol (VENTOLIN HFA) 108 (90 Base) MCG/ACT inhaler Inhale 2 puffs into the lungs every 4 (four) hours as needed for wheezing or shortness of breath.  ? amLODipine (NORVASC) 5 MG tablet Take 1 tablet by mouth daily.  ? aspirin 81 MG EC tablet Take by mouth.  ? Continuous Blood Gluc Sensor (FREESTYLE LIBRE 14 DAY SENSOR) MISC SMARTSIG:1 Each Topical Every 2 Weeks  ? fenofibrate (TRICOR) 145 MG tablet Take 1 tablet by mouth daily.  ? glipiZIDE (GLUCOTROL XL) 2.5 MG 24 hr tablet glipizide ER 2.5 mg tablet, extended release 24 hr  ? hydrochlorothiazide (HYDRODIURIL) 50 MG tablet Take 1 tablet (50 mg total) by mouth daily.  ? insulin degludec (TRESIBA) 200 UNIT/ML FlexTouch Pen Inject 80 Units into the skin daily.  ? levocetirizine (XYZAL) 5 MG tablet TAKE 1 TABLET BY MOUTH ONCE EVERY EVENING FOR ALLERGIES  ? levothyroxine (SYNTHROID) 125 MCG tablet Take 1 tablet by mouth every morning on an empty stomach with water only.  No food or other medications for 30 minutes.  ? metFORMIN (GLUCOPHAGE) 1000 MG tablet TAKE 1 TABLET BY MOUTH TWICE A DAY  ? NOVOLOG FLEXPEN 100 UNIT/ML FlexPen Inject 28 units before breakfast, inject 24 units beforelunch, inject 34 units before supper. Increase when blood sugar is over 150. Use 2 units more for every 50 over.  ? ramipril (ALTACE) 10 MG capsule TAKE 1 CAPSULE BY MOUTH 2 TIMES DAILY  ? rosuvastatin (CRESTOR) 10 MG tablet TAKE 1 TABLET BY MOUTH ONCE A DAY FOR CHOLESTEROL.  ? TRUEPLUS 5-BEVEL  PEN NEEDLES 31G X 6 MM MISC   ? TRULICITY 1.5 EU/2.3NT SOPN Inject 1.5 mg as directed once a week.  ? fluticasone (FLONASE) 50 MCG/ACT nasal spray Place 1 spray into both nostrils 2 (two) times daily. (Patient not taking: Reported on 10/14/2021)  ? [DISCONTINUED] fenofibrate (TRICOR) 145 MG tablet Take 145 mg by mouth daily.  ? [DISCONTINUED] levothyroxine (SYNTHROID) 125 MCG tablet Take by mouth.  ? ?No facility-administered encounter medications on file as of 10/14/2021.   ? ? ?Allergies (verified) ?Atorvastatin and Other  ? ?History: ?Past Medical History:  ?Diagnosis Date  ? Adenomatous polyp   ? Aortic atherosclerosis ([place]) 06/16/2020  ? Arthritis   ? knee  ? Chronic obstructive lung disease ([place])   ? Colon polyp 2008  ? COPD with emphysema ([place])   ? Diabetes mellitus   ? Emphysema lung ([place]) 06/16/2020  ? Emphysema lung ([place])   ? Emphysema of lung ([place]) 2016  ? Hypercholesteremia   ? Hyperlipemia   ? Hypertension   ? Hypothyroidism   ? Observed sleep apnea   ? Oxygen deficiency 2016  ? Sleep apnea   ? Statin myopathy   ? ?Past Surgical History:  ?Procedure Laterality Date  ? COLONOSCOPY  2008, 2016  ? Dr Bary Castilla  ? COLONOSCOPY WITH PROPOFOL N/A 10/03/2017  ? Procedure: COLONOSCOPY WITH PROPOFOL;  Surgeon: Robert Bellow, MD;  Location[facility] ENDOSCOPY;  Service: Endoscopy;  Laterality: N/A;  ? COLONOSCOPY WITH PROPOFOL N/A 10/08/2020  ? Procedure: COLONOSCOPY WITH PROPOFOL;  Surgeon: Robert Bellow, MD;  Location: [facility] ENDOSCOPY;  Service: Endoscopy;  Laterality: N/A;  ? DERMATOFIBROMA  03/26/2015  ? Procedure: Excision mass left leg;  Surgeon: Robert Bellow, MD;  Location: [facility] ORS;  Service: General;  Laterality: N/A;  ? LIPOMA EXCISION N/A 03/26/2015  ?    ? TONSILLECTOMY    ? ?Family History  ?Problem Relation Age of Onset  ? Stroke Mother   ? Diabetes Mother   ? Early death Mother   ? Stroke Father   ? Arthritis Father   ? Hypertension Father   ? Diabetes Other   ? Other Daughter 96  ?     precancerous colon polyp   ? Diabetes Maternal Grandmother   ? Diabetes Paternal Grandmother   ? ?Social History  ? ?Socioeconomic History  ? Marital status: Married  ?  Spouse name: [name]  ? Number of children: 3  ? Years of education: Not on file  ? Highest education level: Not on file  ?Occupational History  ? Occupation: retired  ?Tobacco Use  ? Smoking status: Former  ?  Packs/day: 2.00  ?  Years: 35.00  ?  Pack years: 70.00  ?  Types: Cigarettes, E-cigarettes  ?  Quit  date: 12/11/2014  ?  Years since quitting: 6.8  ? Smokeless tobacco: Never  ? Tobacco comments:  ?  I don't smoke anymore.  ?Vaping Use  ? Vaping Use: Never used  ?Substance and Sexual Activity  ? Alcohol use: No  ? Drug use: No  ? Sexual activity: Not Currently  ?  Birth control/protection: None  ?Other Topics Concern  ? Not on file  ?Social History Narrative  ? Married.  ? 3 children. 2 grandchildren.  ? Retired. He once worked as a Administrator.  ? Enjoys working on projects around [employer].   ? One son lives next door, another stays with them sometimes  ? ?Social Determinants of Health  ? ?Emergency planning/management officer  Strain: Low Risk   ? Difficulty of Paying Living Expenses: Not very hard  ?Food Insecurity: No Food Insecurity  ? Worried About Charity fundraiser in the Last Year: Never true  ? Ran Out of Food in the Last Year: Never true  ?Transportation Needs: No Transportation Needs  ? Lack of Transportation (Medical): No  ? Lack of Transportation (Non-Medical): No  ?Physical Activity: Insufficiently Active  ? Days of Exercise per Week: 7 days  ? Minutes of Exercise per Session: 10 min  ?Stress: No Stress Concern Present  ? Feeling of Stress : Not at all  ?Social Connections: Socially Integrated  ? Frequency of Communication with Friends and Family: More than three times a week  ? Frequency of Social Gatherings with Friends and Family: More than three times a week  ? Attends Religious Services: More than 4 times per year  ? Active Member of Clubs or Organizations: Yes  ? Attends Archivist Meetings: More than 4 times per year  ? Marital Status: Married  ? ? ?Tobacco Counseling ?Counseling given: Not Answered ?Tobacco comments: I don't smoke anymore. ? ? ?Clinical Intake: ? ?Pre-visit preparation completed: Yes ? ?Pain : 0-10 ?Pain Score: 7  ?Pain Type: Chronic pain ?Pain Location: Knee ?Pain Orientation: Right, Left ?Pain Descriptors / Indicators: Aching, Discomfort ?Pain Onset: More than a month ago ?Pain  Frequency: Intermittent ? ?  ? ?BMI - recorded: 44.48 ?Nutritional Status: BMI > 30  Obese ?Nutritional Risks: None ?Diabetes: Yes ?CBG done?: No ?Did pt. bring in CBG monitor from home?: No ? ?How often do you need to

## 2021-10-14 NOTE — Telephone Encounter (Signed)
He picked up first Trulicity RX last month and it was over $200 - he will not be able to continue using it if he cannot get it cheaper. Wondering if you could assist in any way. Thanks for any assistance or helpful information you may be able to provide.  ?

## 2021-10-24 NOTE — Telephone Encounter (Signed)
Contacted [facility] and spoke with pharmacy department. The patient has reached coverage gap and is in donut hole for the medication Trulicity. His plan states Trulicity is tier 5 drug.The patient pays 31% co-insurance and $95 cost for the prescription.  He paid $643 for 32RJ of Trulicity He states he is unable to afford this cost. ? ?[name], CPP notified ? ?Dathan Attia, CCMA ?Health concierge  ?[phone]  ?

## 2021-10-26 NOTE — Telephone Encounter (Signed)
Patient is in coverage gap and Trulicity is too expensive. Unfortunately, [employer] pt assistance is not accepting new applications for Trulicity at this time. The best option may be to switch to another GLP-1 (Ozempic) and try to apply to that pt assistance program [employer] is accepting applications for Ozempic). ? ?Attempted to reach patient to discuss - his endocrinologist manages his DM medications, would recommend he discuss switch to [employer] application with them. Pt did not answer. ?

## 2021-11-07 ENCOUNTER — Ambulatory Visit (INDEPENDENT_AMBULATORY_CARE_PROVIDER_SITE_OTHER): Payer: HMO | Admitting: Family Medicine

## 2021-11-07 VITALS — BP 134/72 | HR 91 | Temp 98.0°F | Ht 70.0 in | Wt 313.0 lb

## 2021-11-07 DIAGNOSIS — Z794 Long term (current) use of insulin: Secondary | ICD-10-CM

## 2021-11-07 DIAGNOSIS — E119 Type 2 diabetes mellitus without complications: Secondary | ICD-10-CM | POA: Diagnosis not present

## 2021-11-07 DIAGNOSIS — S80811A Abrasion, right lower leg, initial encounter: Secondary | ICD-10-CM

## 2021-11-07 DIAGNOSIS — L03115 Cellulitis of right lower limb: Secondary | ICD-10-CM | POA: Diagnosis not present

## 2021-11-07 MED ORDER — CEPHALEXIN 500 MG PO CAPS: 500.0000 mg | ORAL_CAPSULE | Freq: Four times a day (QID) | ORAL | 0 refills | Status: DC

## 2021-11-07 NOTE — Progress Notes (Signed)
? ? ?Altus Zaino T. Balbina Depace, MD, [facility] ?Therapist, music at [facility] ?[place] ?[place], [address] ? ?Phone: [phone]  FAX: [phone] ? ?[name] - 71 y.o. male  MRN [id number]  Date of Birth: August 08, 1950 ? ?Date: 11/07/2021  PCP: Pleas Koch, NP  Referral: Pleas Koch, NP ? ?Chief Complaint  ?Patient presents with  ? Leg Injury  ?  Here for fall on the right leg  ?  ? ? ?This visit occurred during the SARS-CoV-2 public health emergency.  Safety protocols were in place, including screening questions prior to the visit, additional usage of staff PPE, and extensive cleaning of exam room while observing appropriate contact time as indicated for disinfecting solutions.  ? ?Subjective:  ? ?[name] is a 71 y.o. very pleasant male patient with Body mass index is 44.91 kg/m?. who presents with the following: ? ?I remember giving well for his prior office visits, and he presents today for a right-sided leg injury after he fell last Thursday. ? ?He is an insulin-dependent diabetic, and he and his wife wanted to check to make sure that the wound looked okay.  He has had some redness and warmth additionally.  He is afebrile. ? ?He did have a wound in prior years that required wound management and long-term care. ? ? ?Lab Results  ?Component Value Date  ? HGBA1C 7.6 09/07/2020  ?  ? ?Review of Systems is noted in the HPI, as appropriate ? ?Objective:  ? ?BP 134/72 (BP Location: Right Arm, Patient Position: Sitting, Cuff Size: Normal)   Pulse 91   Temp 98 ?F (36.7 ?C) (Oral)   Ht '5\' 10"'$  (1.778 m)   Wt (!) 313 lb (142 kg)   SpO2 98%   BMI 44.91 kg/m?  ? ?GEN: No acute distress; alert,appropriate. ?PULM: Breathing comfortably in no respiratory distress ?PSYCH: Normally interactive.  ? ? ? ?There is some warmth in the pinkish area. ? ?Laboratory and Imaging Data: ? ?Assessment and Plan:  ? ?  ICD-10-CM   ?1. Leg abrasion, right, initial encounter  P10.258N   ?   ?2. Cellulitis of leg, right  L03.115   ?  ?3. Insulin dependent type 2 diabetes mellitus (HCC)  E11.9   ? Z79.4   ?  ? ?I worried that he is starting to develop some cellulitis, so I will start him on Keflex.  High risk patient with insulin-dependent diabetes.  I would anticipate that he will do well.  We also reviewed basic wound care. ? ?Meds ordered this encounter  ?Medications  ? cephALEXin (KEFLEX) 500 MG capsule  ?  Sig: Take 1 capsule (500 mg total) by mouth 4 (four) times daily.  ?  Dispense:  40 capsule  ?  Refill:  0  ? ?There are no discontinued medications. ?No orders of the defined types were placed in this encounter. ? ? ?Follow-up: No follow-ups on file. ? ?Dragon [employer] One speech-to-text software was used for transcription in this dictation.  Possible transcriptional errors can occur using Editor, commissioning.  ? ?Signed, ? ?Duward Allbritton T. Jacobus Colvin, MD ? ? ?Outpatient Encounter Medications as of 11/07/2021  ?Medication Sig  ? albuterol (VENTOLIN HFA) 108 (90 Base) MCG/ACT inhaler Inhale 2 puffs into the lungs every 4 (four) hours as needed for wheezing or shortness of breath.  ? amLODipine (NORVASC) 5 MG tablet Take 1 tablet by mouth daily.  ? aspirin 81 MG EC tablet Take by mouth.  ?  cephALEXin (KEFLEX) 500 MG capsule Take 1 capsule (500 mg total) by mouth 4 (four) times daily.  ? Continuous Blood Gluc Sensor (FREESTYLE LIBRE 14 DAY SENSOR) MISC SMARTSIG:1 Each Topical Every 2 Weeks  ? fenofibrate (TRICOR) 145 MG tablet Take 1 tablet by mouth daily.  ? fluticasone (FLONASE) 50 MCG/ACT nasal spray Place 1 spray into both nostrils 2 (two) times daily.  ? glipiZIDE (GLUCOTROL XL) 2.5 MG 24 hr tablet glipizide ER 2.5 mg tablet, extended release 24 hr  ? hydrochlorothiazide (HYDRODIURIL) 50 MG tablet Take 1 tablet (50 mg total) by mouth daily.  ? insulin degludec (TRESIBA) 200 UNIT/ML FlexTouch Pen Inject 80 Units into the skin daily.  ? levocetirizine (XYZAL) 5 MG tablet TAKE 1 TABLET BY MOUTH ONCE EVERY  EVENING FOR ALLERGIES  ? levothyroxine (SYNTHROID) 125 MCG tablet Take 1 tablet by mouth every morning on an empty stomach with water only.  No food or other medications for 30 minutes.  ? metFORMIN (GLUCOPHAGE) 1000 MG tablet TAKE 1 TABLET BY MOUTH TWICE A DAY  ? NOVOLOG FLEXPEN 100 UNIT/ML FlexPen Inject 28 units before breakfast, inject 24 units beforelunch, inject 34 units before supper. Increase when blood sugar is over 150. Use 2 units more for every 50 over.  ? ramipril (ALTACE) 10 MG capsule TAKE 1 CAPSULE BY MOUTH 2 TIMES DAILY  ? rosuvastatin (CRESTOR) 10 MG tablet TAKE 1 TABLET BY MOUTH ONCE A DAY FOR CHOLESTEROL.  ? TRUEPLUS 5-BEVEL PEN NEEDLES 31G X 6 MM MISC   ? TRULICITY 1.5 OB/0.9GG SOPN Inject 1.5 mg as directed once a week.  ? ?No facility-administered encounter medications on file as of 11/07/2021.  ?  ?

## 2021-11-07 NOTE — Telephone Encounter (Signed)
Several attempts to reach the patient , unsuccessful  ? ?[name], CPP notified ? ?Diron Haddon, CCMA ?Health concierge  ?[phone]  ?

## 2021-11-08 ENCOUNTER — Encounter: Payer: Self-pay | Admitting: Family Medicine

## 2021-11-16 ENCOUNTER — Telehealth: Payer: Self-pay

## 2021-11-16 NOTE — Chronic Care Management (AMB) (Signed)
? ? ?Chronic Care Management ?Pharmacy Assistant  ? ?Name: [name]  MRN: [id number] DOB: 1951-04-27 ? ? ?Reason for Encounter:Diabetes  Disease State ?  ? ?Recent office visits:  ?11/07/21-Spencer [name],MD(family Med)-Right leg abrasion-start Keflex '500mg'$  take 1 cap.4 times daily. ? ?Recent consult visits:  ?10/04/21-Jennifer Galaway,DPM(podiatry)callus trim,toenail trim  ?09/02/21-Anna [name],MD(Endocrin)-new A1c 7.1- no medication changes ?07/01/21-Jennifer Galaway,DPM (podiatry)- annual exam,toenail trim, ? ?Hospital visits:  ?None in previous 6 months ? ?Medications: ?Outpatient Encounter Medications as of 11/16/2021  ?Medication Sig Note  ? albuterol (VENTOLIN HFA) 108 (90 Base) MCG/ACT inhaler Inhale 2 puffs into the lungs every 4 (four) hours as needed for wheezing or shortness of breath.   ? amLODipine (NORVASC) 5 MG tablet Take 1 tablet by mouth daily.   ? aspirin 81 MG EC tablet Take by mouth.   ? cephALEXin (KEFLEX) 500 MG capsule Take 1 capsule (500 mg total) by mouth 4 (four) times daily.   ? Continuous Blood Gluc Sensor (FREESTYLE LIBRE 14 DAY SENSOR) MISC SMARTSIG:1 Each Topical Every 2 Weeks   ? fenofibrate (TRICOR) 145 MG tablet Take 1 tablet by mouth daily.   ? fluticasone (FLONASE) 50 MCG/ACT nasal spray Place 1 spray into both nostrils 2 (two) times daily.   ? glipiZIDE (GLUCOTROL XL) 2.5 MG 24 hr tablet glipizide ER 2.5 mg tablet, extended release 24 hr   ? hydrochlorothiazide (HYDRODIURIL) 50 MG tablet Take 1 tablet (50 mg total) by mouth daily.   ? insulin degludec (TRESIBA) 200 UNIT/ML FlexTouch Pen Inject 80 Units into the skin daily. 10/14/2021: 70 U QHS  ? levocetirizine (XYZAL) 5 MG tablet TAKE 1 TABLET BY MOUTH ONCE EVERY EVENING FOR ALLERGIES   ? levothyroxine (SYNTHROID) 125 MCG tablet Take 1 tablet by mouth every morning on an empty stomach with water only.  No food or other medications for 30 minutes.   ? metFORMIN (GLUCOPHAGE) 1000 MG tablet TAKE 1 TABLET BY MOUTH TWICE A DAY   ?  NOVOLOG FLEXPEN 100 UNIT/ML FlexPen Inject 28 units before breakfast, inject 24 units beforelunch, inject 34 units before supper. Increase when blood sugar is over 150. Use 2 units more for every 50 over.   ? ramipril (ALTACE) 10 MG capsule TAKE 1 CAPSULE BY MOUTH 2 TIMES DAILY   ? rosuvastatin (CRESTOR) 10 MG tablet TAKE 1 TABLET BY MOUTH ONCE A DAY FOR CHOLESTEROL.   ? TRUEPLUS 5-BEVEL PEN NEEDLES 31G X 6 MM MISC    ? TRULICITY 1.5 WU/9.8JX SOPN Inject 1.5 mg as directed once a week.   ? ?No facility-administered encounter medications on file as of 11/16/2021.  ? ? ? ? ?Recent Relevant Labs: ?Lab Results  ?Component Value Date/Time  ? HGBA1C 7.6 09/07/2020 12:00 AM  ? HGBA1C 7.5 (H) 06/01/2016 07:50 AM  ? HGBA1C 8.7 11/26/2015 08:55 AM  ? HGBA1C 8.0 08/27/2015 09:58 AM  ? MICROALBUR 1.1 06/01/2016 07:50 AM  ?  ?Kidney Function ?Lab Results  ?Component Value Date/Time  ? CREATININE 1.1 09/07/2020 12:00 AM  ? CREATININE 1.07 06/01/2016 07:50 AM  ? CREATININE 1.05 02/19/2015 09:23 AM  ? GFR 74.02 06/01/2016 07:50 AM  ? GFRNONAA 76 02/19/2015 09:23 AM  ? GFRAA 88 02/19/2015 09:23 AM  ? ? ? ?Contacted patient on 11/16/21 to discuss diabetes disease state.  ? ?Current antihyperglycemic regimen:  ?Tresiba - Inject 70 units daily ?Novolog Flexpen - Inject 34(breakfast) - 24 (lunch) - 34 (supper) units before meals  ?Metformin 1000 mg - 1 tablet BID ?Trulicity 1.5  mg - Inject weekly  ? ?  Patient verbally confirms he is taking the above medications as directed. Yes ? ?What diet changes have been made to improve diabetes control? Patient reports low sugar diet. ? ?What recent interventions/DTPs have been made to improve glycemic control:  ? Vicente Males [name],MD- stopped glipizide and cut back 10 units on novolog,   patient in donut hole for Trulicity, however he is not interested in Ozempic and trulicity is helping so he continues to take weekly ? ?Have there been any recent hospitalizations or ED visits since last visit with CPP?  No ? ?Patient denies hypoglycemic symptoms, including Pale, Sweaty, Shaky, Hungry, Nervous/irritable, and Vision changes ? ?Patient denies hyperglycemic symptoms, including blurry vision, excessive thirst, fatigue, polyuria, and weakness ? ?How often are you checking your blood sugar? 3-4 times daily Patient has Freestyle [place] system  ? ?What are your blood sugars ranging?  ?Fasting: 117,114,131 ?After meals: 130,132,96 ?Bedtime:105,240,243   (vegetable pizza for 2 nights) daughter in hospital , stress concerns ? ? Per Freestyle system: weekly average  147, 30 day average 151  90 day average 138 ? ?During the week, how often does your blood glucose drop below 70? Never ? ?Are you checking your feet daily/regularly? Yes ? ?Adherence Review: ?Is the patient currently on a STATIN medication? Yes ?Is the patient currently on ACE/ARB medication? Yes ?Does the patient have >5 day gap between last estimated fill dates? No ? ?Care Gaps: ?Annual wellness visit in last year? Yes ?Most recent A1C reading:7.1  08/16/21 ?Most Recent BP reading:134/72  91-P  11/07/21 ? ?Last eye exam / retinopathy screening:UTD ?Last diabetic foot exam:UTD ? ?Counseled patient on importance of annual eye and foot exam. UTD ? ?Star Rating Drugs:  ?Medication:  Last Fill: Day Supply ?Rosuvastatin '10mg'$  10/17/21   90 ?Trulicity 1.'5mg'$   Manufacturer - patient in donut hole- not interested in [place] ?Tyler Aas  03/15/21  77 ?Ramipril '10mg'$   10/17/21   90 ?Metformin '1000mg'$  08/24/21   90 ?Novolog  06/16/21  30 ? ? ?CCM appointment on 05/30/22 ? ?[name], CPP notified ? ?Lucita Montoya, CCMA ?Health concierge  ?[phone]  ?

## 2022-01-02 DIAGNOSIS — E538 Deficiency of other specified B group vitamins: Secondary | ICD-10-CM | POA: Diagnosis not present

## 2022-01-02 DIAGNOSIS — R5381 Other malaise: Secondary | ICD-10-CM | POA: Diagnosis not present

## 2022-01-02 DIAGNOSIS — R5383 Other fatigue: Secondary | ICD-10-CM | POA: Diagnosis not present

## 2022-01-02 DIAGNOSIS — E1169 Type 2 diabetes mellitus with other specified complication: Secondary | ICD-10-CM | POA: Diagnosis not present

## 2022-01-02 DIAGNOSIS — E669 Obesity, unspecified: Secondary | ICD-10-CM | POA: Diagnosis not present

## 2022-01-02 DIAGNOSIS — E039 Hypothyroidism, unspecified: Secondary | ICD-10-CM | POA: Diagnosis not present

## 2022-01-02 LAB — HEMOGLOBIN A1C: Hemoglobin A1C: 7.2

## 2022-01-09 DIAGNOSIS — E1142 Type 2 diabetes mellitus with diabetic polyneuropathy: Secondary | ICD-10-CM | POA: Diagnosis not present

## 2022-01-09 DIAGNOSIS — E1159 Type 2 diabetes mellitus with other circulatory complications: Secondary | ICD-10-CM | POA: Diagnosis not present

## 2022-01-09 DIAGNOSIS — E538 Deficiency of other specified B group vitamins: Secondary | ICD-10-CM | POA: Diagnosis not present

## 2022-01-09 DIAGNOSIS — E1169 Type 2 diabetes mellitus with other specified complication: Secondary | ICD-10-CM | POA: Diagnosis not present

## 2022-01-09 DIAGNOSIS — E669 Obesity, unspecified: Secondary | ICD-10-CM | POA: Diagnosis not present

## 2022-01-09 DIAGNOSIS — Z794 Long term (current) use of insulin: Secondary | ICD-10-CM | POA: Diagnosis not present

## 2022-01-09 DIAGNOSIS — I10 Essential (primary) hypertension: Secondary | ICD-10-CM | POA: Diagnosis not present

## 2022-01-10 ENCOUNTER — Encounter: Payer: Self-pay | Admitting: Podiatry

## 2022-01-10 ENCOUNTER — Ambulatory Visit (INDEPENDENT_AMBULATORY_CARE_PROVIDER_SITE_OTHER): Payer: HMO

## 2022-01-10 ENCOUNTER — Ambulatory Visit: Payer: HMO | Admitting: Podiatry

## 2022-01-10 DIAGNOSIS — B351 Tinea unguium: Secondary | ICD-10-CM

## 2022-01-10 DIAGNOSIS — E119 Type 2 diabetes mellitus without complications: Secondary | ICD-10-CM | POA: Diagnosis not present

## 2022-01-10 DIAGNOSIS — M2041 Other hammer toe(s) (acquired), right foot: Secondary | ICD-10-CM | POA: Diagnosis not present

## 2022-01-10 DIAGNOSIS — Z794 Long term (current) use of insulin: Secondary | ICD-10-CM | POA: Diagnosis not present

## 2022-01-10 DIAGNOSIS — L84 Corns and callosities: Secondary | ICD-10-CM

## 2022-01-10 DIAGNOSIS — M79675 Pain in left toe(s): Secondary | ICD-10-CM | POA: Diagnosis not present

## 2022-01-10 DIAGNOSIS — M79674 Pain in right toe(s): Secondary | ICD-10-CM

## 2022-01-10 DIAGNOSIS — M2042 Other hammer toe(s) (acquired), left foot: Secondary | ICD-10-CM

## 2022-01-17 NOTE — Progress Notes (Signed)
  Subjective:  Patient ID: [name], male    DOB: 05-26-1951,  MRN: [id number]  ZAKK BORGEN presents to clinic today for preventative diabetic foot care and callus(es) right lower extremity and painful thick toenails that are difficult to trim. Painful toenails interfere with ambulation. Aggravating factors include wearing enclosed shoe gear. Pain is relieved with periodic professional debridement. Painful calluses are aggravated when weightbearing with and without shoegear. Pain is relieved with periodic professional debridement.  Patient states blood glucose was 142 mg/dl today.  Last A1c was 7.2%.  New problem(s): None.   PCP is Pleas Koch, NP , and last visit was April 27, 2021.  Allergies  Allergen Reactions   Atorvastatin     Other reaction(s): Headache   Other Other (See Comments)    STATIN DRUG-CAUSED MUSCLE PAIN/LETHARGIC    Review of Systems: Negative except as noted in the HPI.  Objective: No changes noted in today's physical examination. [name] is a pleasant 71 y.o. male in NAD. AAO X 3.  Vascular Examination: CFT <3 seconds b/l LE. Faintly palpable DP pulses b/l LE. Faintly palpable PT pulse(s) b/l LE. Pedal hair absent. No pain with calf compression b/l. No edema noted b/l LE. No ischemia or gangrene noted b/l LE. No cyanosis or clubbing noted b/l LE.  Dermatological Examination: Pedal integument with normal turgor, texture and tone BLE. No open wounds b/l LE. No interdigital macerations noted b/l LE. Toenails 1-5 bilaterally elongated, discolored, dystrophic, thickened, and crumbly with subungual debris and tenderness to dorsal palpation. Hyperkeratotic lesion(s) submet head 3 right foot and submet head 5 right foot.  No erythema, no edema, no drainage, no fluctuance.  Musculoskeletal Examination: Muscle strength 5/5 to all lower extremity muscle groups bilaterally. No pain, crepitus or joint limitation noted with ROM bilateral LE. Hammertoe  deformity noted 1-5 b/l.   Neurological Examination: Pt has subjective symptoms of neuropathy. Protective sensation intact 5/5 intact bilaterally with 10g monofilament b/l. Assessment/Plan: 1. Pain due to onychomycosis of toenails of both feet   2. Callus   3. Type 2 diabetes mellitus without complication, with long-term current use of insulin ([place])      -Examined patient. -Continue foot and shoe inspections daily. Monitor blood glucose per PCP/Endocrinologist's recommendations. -Mycotic toenails 1-5 bilaterally were debrided in length and girth with sterile nail nippers and dremel without incident. -Callus(es) submet head 3 right foot and submet head 5 right foot pared utilizing sterile scalpel blade without complication or incident. Total number debrided =2. -Patient/POA to call should there be question/concern in the interim.   Return in about 3 months (around 04/12/2022).  Marzetta Board, DPM

## 2022-01-20 ENCOUNTER — Telehealth: Payer: HMO | Admitting: Nurse Practitioner

## 2022-01-20 DIAGNOSIS — U071 COVID-19: Secondary | ICD-10-CM

## 2022-01-20 NOTE — Progress Notes (Signed)
Since your COVID-19 symptoms started less than 5 days ago you may need a prescription for FDA-approved treatments (pills or IV therapy), which we cannot prescribe through an e-Visit. The best way for our providers to make a decision about which COVID treatment is right for you is through a Virtual Urgent Care Visit.  If you would like to discuss COVID therapy options with a provider, cancel this e-Visit and access a Virtual Urgent Care Visit from our [place] menu.   You will not be charged for the e-Visit.    Your other option is to be seen in person at a local urgent care  Because of your history of COPD and your current symptoms, I feel your condition warrants further evaluation and I recommend that you be seen in a face to face visit.   NOTE: There will be NO CHARGE for this eVisit   If you are having a true medical emergency please call 911.      For an urgent face to face visit, [place] has seven urgent care centers for your convenience:     [facility] [place] [facility] [place] Get Driving Directions [phone] [place], [place] [address]    [facility]) Get Driving Directions [phone] [place], [place] [address]  [facility] [place] Get Driving Directions [phone] [address] [place],  [place]  [address]  [facility] at [address] [place] Get Driving Directions  [phone] [address] [place],  [place] [address]   [facility] Get Driving Directions [phone] [address] [place], [place], [place] [address]   [facility] Get Driving Directions  [phone] [address] [place], [place] [address]   [facility] [place] Get Driving Directions [phone] [address] [place], [place] [address]  Your MyChart E-visit questionnaire answers were reviewed by a board certified advanced clinical practitioner to complete your personal care plan based on your specific symptoms.  Thank you for using e-Visits.

## 2022-01-21 ENCOUNTER — Other Ambulatory Visit (HOSPITAL_COMMUNITY): Payer: Self-pay

## 2022-01-21 ENCOUNTER — Ambulatory Visit
Admission: EM | Admit: 2022-01-21 | Discharge: 2022-01-21 | Disposition: A | Discharge status: Home / Self Care | Payer: HMO | Attending: Family Medicine | Admitting: Family Medicine

## 2022-01-21 ENCOUNTER — Ambulatory Visit (INDEPENDENT_AMBULATORY_CARE_PROVIDER_SITE_OTHER): Payer: HMO

## 2022-01-21 DIAGNOSIS — Z20822 Contact with and (suspected) exposure to covid-19: Secondary | ICD-10-CM

## 2022-01-21 DIAGNOSIS — R062 Wheezing: Secondary | ICD-10-CM | POA: Diagnosis not present

## 2022-01-21 DIAGNOSIS — J449 Chronic obstructive pulmonary disease, unspecified: Secondary | ICD-10-CM | POA: Diagnosis not present

## 2022-01-21 DIAGNOSIS — R0789 Other chest pain: Secondary | ICD-10-CM | POA: Diagnosis not present

## 2022-01-21 DIAGNOSIS — J189 Pneumonia, unspecified organism: Secondary | ICD-10-CM

## 2022-01-21 DIAGNOSIS — U071 COVID-19: Secondary | ICD-10-CM | POA: Diagnosis not present

## 2022-01-21 MED ORDER — NIRMATRELVIR/RITONAVIR (PAXLOVID)TABLET: 3.0000 | ORAL_TABLET | Freq: Two times a day (BID) | ORAL | 0 refills | Status: AC

## 2022-01-21 MED ORDER — PREDNISONE 10 MG PO TABS: 10.0000 mg | ORAL_TABLET | Freq: Every day | ORAL | 0 refills | Status: AC

## 2022-01-21 MED ORDER — DOXYCYCLINE HYCLATE 100 MG PO CAPS: 100.0000 mg | ORAL_CAPSULE | Freq: Two times a day (BID) | ORAL | 0 refills | Status: AC

## 2022-01-21 NOTE — ED Provider Notes (Signed)
[name]    CSN: [id number] Arrival date & time: 01/21/22  0809      History   Chief Complaint Chief Complaint  Patient presents with   Cough   Nasal Congestion   Shortness of Breath    HPI [name] is a 71 y.o. male.   HPI Patient with a medical history of COPD, type 2 diabetes, obesity presents today for URI symptoms which developed within the last day and a half after being exposed to family member who was tested positive for COVID-19.  Patient reports taking an expired home COVID test yesterday which resulted as positive.  He endorses a productive cough, some chest heaviness, nasal congestion, and generalized fatigue.  He is unaware if he has had any fever.  He has been taking over-the-counter Coricidin for management of cough.  Patient is fully vaccinated against COVID with the exception of that he has not received his fourth booster. He denies shortness of breath or wheezing. He checked blood sugar today at home, reading 139 which is consistent with his fasting blood sugars.  Past Medical History:  Diagnosis Date   Adenomatous polyp    Aortic atherosclerosis ([place]) 06/16/2020   Arthritis    knee   Chronic obstructive lung disease ([place])    Colon polyp 2008   COPD with emphysema ([place])    Diabetes mellitus    Emphysema lung ([place]) 06/16/2020   Emphysema lung ([place])    Emphysema of lung ([place]) 2016   Hypercholesteremia    Hyperlipemia    Hypertension    Hypothyroidism    Observed sleep apnea    Oxygen deficiency 2016   Sleep apnea    Statin myopathy     Patient Active Problem List   Diagnosis Date Noted   Acute maxillary sinusitis 12/05/2020   Aortic atherosclerosis ([place]) 06/16/2020   Nevus 01/08/2020   Welcome to Medicare preventive visit 09/25/2017   Personal history of tobacco use, presenting hazards to health 06/30/2016   Preventative health care 06/05/2016   Osteoarthritis of both knees 11/09/2015   Diabetes mellitus ([place]) 06/14/2015    Allergic rhinitis 05/28/2015   Morbid obesity with BMI of 40.0-44.9, adult ([place]) 05/28/2015   Chronic constipation 05/28/2015   Chronic obstructive pulmonary disease ([place]) 05/28/2015   Essential (primary) hypertension 05/28/2015   Hypercholesterolemia 05/28/2015   Gastro-esophageal reflux disease without esophagitis 05/28/2015   Hypothyroidism 05/28/2015   Dermatofibroma of left lower leg 04/01/2015   Former smoker 03/04/2015   History of colonic polyps 08/20/2014   Idiopathic localized osteoarthropathy 03/16/2014    Past Surgical History:  Procedure Laterality Date   COLONOSCOPY  2008, 2016   Dr Bary Castilla   COLONOSCOPY WITH PROPOFOL N/A 10/03/2017   Procedure: COLONOSCOPY WITH PROPOFOL;  Surgeon: Robert Bellow, MD;  Location[facility] ENDOSCOPY;  Service: Endoscopy;  Laterality: N/A;   COLONOSCOPY WITH PROPOFOL N/A 10/08/2020   Procedure: COLONOSCOPY WITH PROPOFOL;  Surgeon: Robert Bellow, MD;  Location: [facility] ENDOSCOPY;  Service: Endoscopy;  Laterality: N/A;   DERMATOFIBROMA  03/26/2015   Procedure: Excision mass left leg;  Surgeon: Robert Bellow, MD;  Location: [facility] ORS;  Service: General;  Laterality: N/A;   LIPOMA EXCISION N/A 03/26/2015       TONSILLECTOMY         Home Medications    Prior to Admission medications   Medication Sig Start Date End Date Taking? Authorizing Provider  albuterol (VENTOLIN HFA) 108 (90 Base) MCG/ACT inhaler Inhale 2 puffs into the lungs  every 4 (four) hours as needed for wheezing or shortness of breath. 04/27/21   Pleas Koch, NP  amLODipine (NORVASC) 5 MG tablet Take 1 tablet by mouth daily. 08/12/19   [provider]  aspirin 81 MG EC tablet Take by mouth.    [provider]  benzonatate (TESSALON) 200 MG capsule     [provider]  buPROPion (WELLBUTRIN SR) 150 MG 12 hr tablet     [provider]  cetirizine (ZYRTEC) 10 MG tablet     [provider]  Continuous Blood Gluc Sensor  (FREESTYLE LIBRE 14 DAY SENSOR) MISC SMARTSIG:1 Each Topical Every 2 Weeks 10/31/19   [provider]  dexamethasone (DECADRON) 4 MG tablet     [provider]  doxycycline (VIBRAMYCIN) 100 MG capsule     [provider]  Exenatide ER (BYDUREON) 2 MG PEN     [provider]  fenofibrate (TRICOR) 145 MG tablet Take 1 tablet by mouth daily. 09/14/21   [provider]  fluticasone (FLONASE) 50 MCG/ACT nasal spray Place 1 spray into both nostrils 2 (two) times daily. 09/16/20   Pleas Koch, NP  glipiZIDE (GLUCOTROL XL) 2.5 MG 24 hr tablet glipizide ER 2.5 mg tablet, extended release 24 hr    [provider]  hydrochlorothiazide (HYDRODIURIL) 50 MG tablet Take 1 tablet (50 mg total) by mouth daily. 08/21/17   Pleas Koch, NP  HYDROcodone bit-homatropine (HYCODAN) 5-1.5 MG/5ML syrup     [provider]  HYDROcodone-acetaminophen ([place]) 7.5-325 MG tablet     [provider]  influenza vac split quadrivalent (FLUZONE) injection     [provider]  insulin degludec (TRESIBA) 200 UNIT/ML FlexTouch Pen Inject 80 Units into the skin daily.    [provider]  levocetirizine (XYZAL) 5 MG tablet TAKE 1 TABLET BY MOUTH ONCE EVERY EVENING FOR ALLERGIES 06/21/21   Pleas Koch, NP  levothyroxine (SYNTHROID) 125 MCG tablet Take 1 tablet by mouth every morning on an empty stomach with water only.  No food or other medications for 30 minutes. 06/14/21   Pleas Koch, NP  metFORMIN (GLUCOPHAGE) 1000 MG tablet TAKE 1 TABLET BY MOUTH TWICE A DAY 03/13/16   Carmon Ginsberg, PA  montelukast (SINGULAIR) 10 MG tablet     [provider]  NOVOLOG FLEXPEN 100 UNIT/ML FlexPen Inject 28 units before breakfast, inject 24 units beforelunch, inject 34 units before supper. Increase when blood sugar is over 150. Use 2 units more for every 50 over. 04/04/17   [provider]  ramipril (ALTACE) 10 MG capsule TAKE  1 CAPSULE BY MOUTH 2 TIMES DAILY 11/06/17   Pleas Koch, NP  rosuvastatin (CRESTOR) 10 MG tablet TAKE 1 TABLET BY MOUTH ONCE A DAY FOR CHOLESTEROL. 08/08/21   Pleas Koch, NP  sitaGLIPtin (JANUVIA) 100 MG tablet     [provider]  TRUEPLUS 5-BEVEL PEN NEEDLES 31G X 6 MM [place]  10/20/19   [provider]  TRULICITY 1.5 IO/9.6EX SOPN Inject 1.5 mg as directed once a week. 05/30/21   [provider]    Family History Family History  Problem Relation Age of Onset   Stroke Mother    Diabetes Mother    Early death Mother    Stroke Father    Arthritis Father    Hypertension Father    Diabetes Other    Other Daughter 25       precancerous colon polyp  Diabetes Maternal Grandmother    Diabetes Paternal Grandmother     Social History Social History   Tobacco Use   Smoking status: Former    Packs/day: 2.00    Years: 35.00    Total pack years: 70.00    Types: Cigarettes, E-cigarettes    Quit date: 12/11/2014    Years since quitting: 7.1   Smokeless tobacco: Never   Tobacco comments:    I don't smoke anymore.  Vaping Use   Vaping Use: Never used  Substance Use Topics   Alcohol use: No   Drug use: No     Allergies   Atorvastatin and Other   Review of Systems Review of Systems Pertinent negatives listed in HPI  Physical Exam Triage Vital Signs ED Triage Vitals  Enc Vitals Group     BP 01/21/22 0821 (!) 157/79     Pulse Rate 01/21/22 0821 91     Resp 01/21/22 0821 18     Temp 01/21/22 0821 98.8 F (37.1 C)     Temp Source 01/21/22 0821 Oral     SpO2 01/21/22 0821 96 %     Weight --      Height --      Head Circumference --      Peak Flow --      Pain Score 01/21/22 0818 0     Pain Loc --      Pain Edu? --      Excl. in [place]? --    No data found.  Updated Vital Signs BP (!) 157/79 (BP Location: Left Arm)   Pulse 91   Temp 98.8 F (37.1 C) (Oral)   Resp 18   SpO2 96%   Visual Acuity Right Eye Distance:   Left Eye  Distance:   Bilateral Distance:    Right Eye Near:   Left Eye Near:    Bilateral Near:     Physical Exam Constitutional:      Appearance: He is obese. He is ill-appearing.  HENT:     Head: Normocephalic and atraumatic.     Nose: Congestion present.  Eyes:     Extraocular Movements: Extraocular movements intact.     Pupils: Pupils are equal, round, and reactive to light.  Cardiovascular:     Rate and Rhythm: Normal rate and regular rhythm.  Pulmonary:     Breath sounds: Examination of the right-upper field reveals decreased breath sounds. Examination of the left-upper field reveals decreased breath sounds. Examination of the right-middle field reveals decreased breath sounds. Examination of the left-middle field reveals decreased breath sounds. Examination of the right-lower field reveals decreased breath sounds. Examination of the left-lower field reveals decreased breath sounds. Decreased breath sounds present.  Musculoskeletal:     Cervical back: Normal range of motion and neck supple.  Skin:    General: Skin is warm and dry.     Capillary Refill: Capillary refill takes less than 2 seconds.  Neurological:     General: No focal deficit present.     Mental Status: He is alert.  Psychiatric:        Attention and Perception: Attention normal.        Mood and Affect: Mood normal.        Behavior: Behavior normal.      UC Treatments / Results  Labs (all labs ordered are listed, but only abnormal results are displayed) Labs Reviewed  NOVEL CORONAVIRUS, NAA    EKG   Radiology No results found.  Procedures  Procedures (including critical care time)  Medications Ordered in UC Medications - No data to display  Initial Impression / Assessment and Plan / UC Course  I have reviewed the triage vital signs and the nursing notes.  Pertinent labs & imaging results that were available during my care of the patient were reviewed by me and considered in my medical decision  making (see chart for details).    Home COVID test positive. Repeat PCR pending per patient request. Recent BMP reflects normal renal functioning with a GFR 74. Paxlovid initiated as patient is high risk for complications associated with COVID-19 due to comorbidity and BMI.  Chest x-ray concerning for possible pneumonia likely secondary to COVID Given patient's comorbidities will cover with doxycycline along with a very low-dose of prednisone.  Patient also has an incentive spirometer at home encouraged to use several times daily.  Also encouraged to use albuterol inhaler to keep lungs open and to relieve chest tightness.  Red flag precautions discussed. Continue home symptoms management with OTC medications.  Final Clinical Impressions(s) / UC Diagnoses   Final diagnoses:  Suspected COVID-19 virus infection  Chronic obstructive pulmonary disease, unspecified COPD type ([place])  Encounter for screening laboratory testing for COVID-19 virus   Discharge Instructions   None    ED Prescriptions     Medication Sig Dispense Auth. Provider   nirmatrelvir/ritonavir EUA (PAXLOVID) 20 x 150 MG & 10 x '100MG'$  TABS Take 3 tablets by mouth 2 (two) times daily for 5 days. Patient GFR is 74.  Take nirmatrelvir (150 mg) two tablets twice daily for 5 days and ritonavir (100 mg) one tablet twice daily for 5 days. 30 tablet Scot Jun, FNP      PDMP not reviewed this encounter.   Scot Jun, FNP 01/21/22 [phone]

## 2022-01-21 NOTE — ED Triage Notes (Signed)
Pt presents with cough, chest congestion, SOB and runny nose since yesterday. At home test was positive but the test used was expired pt would like to be re-tested.

## 2022-01-21 NOTE — Discharge Instructions (Addendum)
Your COVID 19 results will be available in 48-72 hours. Negative results are immediately resulted to Mychart. Positive results will receive a follow-up call from our clinic. If symptoms are present, I recommend home quarantine for 5 days and masking for an additional 5 days to prevent spread to others.  Schedule a follow-up with your primary care provider in one week for evaluation.  Continue levocetirizine.  Albuterol inhaler for any wheezing, shortness of breath or chest tightness.  Start Paxlovid take as prescribed again this medication will shorten the duration and reduce severity of symptoms of COVID however will not cure the COVID virus.  If you develop any severe shortness of breath, breathlessness, generalized weakness or chest pain go immediately to the nearest emergency department for further evaluation.  If your blood sugars are greater than 300 2 hours after eating a meal or fasting -this is also indication to go to the nearest emergency department.

## 2022-01-23 LAB — NOVEL CORONAVIRUS, NAA: SARS-CoV-2, NAA: DETECTED — AB

## 2022-02-01 DIAGNOSIS — G4733 Obstructive sleep apnea (adult) (pediatric): Secondary | ICD-10-CM | POA: Diagnosis not present

## 2022-02-01 DIAGNOSIS — J449 Chronic obstructive pulmonary disease, unspecified: Secondary | ICD-10-CM | POA: Diagnosis not present

## 2022-02-14 ENCOUNTER — Ambulatory Visit (INDEPENDENT_AMBULATORY_CARE_PROVIDER_SITE_OTHER)
Admission: RE | Admit: 2022-02-14 | Discharge: 2022-02-14 | Disposition: A | Discharge status: Home / Self Care | Payer: HMO | Source: Ambulatory Visit | Attending: Primary Care | Admitting: Primary Care

## 2022-02-14 ENCOUNTER — Encounter: Payer: Self-pay | Admitting: Primary Care

## 2022-02-14 ENCOUNTER — Ambulatory Visit (INDEPENDENT_AMBULATORY_CARE_PROVIDER_SITE_OTHER): Payer: HMO | Admitting: Primary Care

## 2022-02-14 VITALS — BP 140/72 | HR 84 | Temp 97.7°F | Ht 70.0 in | Wt 312.0 lb

## 2022-02-14 DIAGNOSIS — U071 COVID-19: Secondary | ICD-10-CM

## 2022-02-14 DIAGNOSIS — J449 Chronic obstructive pulmonary disease, unspecified: Secondary | ICD-10-CM

## 2022-02-14 HISTORY — DX: COVID-19: U07.1

## 2022-02-14 MED ORDER — AEROCHAMBER PLUS FLO-VU MEDIUM MISC: 0 refills | Status: AC

## 2022-02-14 NOTE — Progress Notes (Signed)
Subjective:    Patient ID: [name], male    DOB: February 03, 1951, 71 y.o.   MRN: [id number]  HPI  [name] is a very pleasant 71 y.o. male with a history of type 2 diabetes, hypothyroidism, COPD, hyperlipidemia, sinusitis, hypertension who presents today for Urgent Care follow up.  He presented to Urgent Care in [place] on 01/21/22 for symptoms of productive cough, chest heaviness, nasal congestion, and fatigue that began 1-2 days prior. He was exposed to Covid-19 by a family member prior to symptom onset. He was found to be Covid positive. He underwent chest xray which revealed diffuse atypical infection. He was treated with Paxlovid and encouraged to use his inhaler.   Today he's feeling much better. Symptoms have nearly resolved except for mild congestion and chest heaviness. He continues to use albuterol inhaler and is down to using every other day. He does not have an aerochamber to use with his inhaler.   He denies fevers, increased cough, increased SOB.    Review of Systems  Constitutional:  Negative for fatigue and fever.  HENT:  Positive for congestion. Negative for sore throat.   Respiratory:  Negative for cough and shortness of breath.   Cardiovascular:  Negative for chest pain.  Neurological:  Negative for headaches.         Past Medical History:  Diagnosis Date   Adenomatous polyp    Aortic atherosclerosis ([place]) 06/16/2020   Arthritis    knee   Chronic obstructive lung disease ([place])    Colon polyp 2008   COPD with emphysema ([place])    Diabetes mellitus    Emphysema lung ([place]) 06/16/2020   Emphysema lung ([facility])    Emphysema of lung ([place]) 2016   Hypercholesteremia    Hyperlipemia    Hypertension    Hypothyroidism    Observed sleep apnea    Oxygen deficiency 2016   Sleep apnea    Statin myopathy     Social History   Socioeconomic History   Marital status: Married    Spouse name: [name]   Number of children: 3   Years of education: Not on file    Highest education level: Not on file  Occupational History   Occupation: retired  Tobacco Use   Smoking status: Former    Packs/day: 2.00    Years: 35.00    Total pack years: 70.00    Types: Cigarettes, E-cigarettes    Quit date: 12/11/2014    Years since quitting: 7.1   Smokeless tobacco: Never   Tobacco comments:    I don't smoke anymore.  Vaping Use   Vaping Use: Never used  Substance and Sexual Activity   Alcohol use: No   Drug use: No   Sexual activity: Not Currently    Birth control/protection: None  Other Topics Concern   Not on file  Social History Narrative   Married.   3 children. 2 grandchildren.   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    One son lives next door, another stays with them sometimes   Social Determinants of Health   Financial Resource Strain: Low Risk  (10/14/2021)   Overall Financial Resource Strain (CARDIA)    Difficulty of Paying Living Expenses: Not very hard  Food Insecurity: No Food Insecurity (10/14/2021)   Hunger Vital Sign    Worried About Running Out of Food in the Last Year: Never true    Ran Out  of Food in the Last Year: Never true  Transportation Needs: No Transportation Needs (10/14/2021)   PRAPARE - Hydrologist (Medical): No    Lack of Transportation (Non-Medical): No  Physical Activity: Insufficiently Active (10/14/2021)   Exercise Vital Sign    Days of Exercise per Week: 7 days    Minutes of Exercise per Session: 10 min  Stress: No Stress Concern Present (10/14/2021)   [place]    Feeling of Stress : Not at all  Social Connections: [place] (10/14/2021)   Social Connection and Isolation Panel [NHANES]    Frequency of Communication with Friends and Family: More than three times a week    Frequency of Social Gatherings with Friends and Family: More than three times a week     Attends Religious Services: More than 4 times per year    Active Member of Clubs or Organizations: Yes    Attends Archivist Meetings: More than 4 times per year    Marital Status: Married  Human resources officer Violence: Not At Risk (10/14/2021)   Humiliation, Afraid, Rape, and Kick questionnaire    Fear of Current or Ex-Partner: No    Emotionally Abused: No    Physically Abused: No    Sexually Abused: No    Past Surgical History:  Procedure Laterality Date   COLONOSCOPY  2008, 2016   Dr Bary Castilla   COLONOSCOPY WITH PROPOFOL N/A 10/03/2017   Procedure: COLONOSCOPY WITH PROPOFOL;  Surgeon: Robert Bellow, MD;  Location: [facility] ENDOSCOPY;  Service: Endoscopy;  Laterality: N/A;   COLONOSCOPY WITH PROPOFOL N/A 10/08/2020   Procedure: COLONOSCOPY WITH PROPOFOL;  Surgeon: Robert Bellow, MD;  Location: [facility] ENDOSCOPY;  Service: Endoscopy;  Laterality: N/A;   DERMATOFIBROMA  03/26/2015   Procedure: Excision mass left leg;  Surgeon: Robert Bellow, MD;  Location: [facility] ORS;  Service: General;  Laterality: N/A;   LIPOMA EXCISION N/A 03/26/2015       TONSILLECTOMY      Family History  Problem Relation Age of Onset   Stroke Mother    Diabetes Mother    Early death Mother    Stroke Father    Arthritis Father    Hypertension Father    Diabetes Other    Other Daughter 25       precancerous colon polyp    Diabetes Maternal Grandmother    Diabetes Paternal Grandmother     Allergies  Allergen Reactions   Atorvastatin     Other reaction(s): Headache   Other Other (See Comments)    STATIN DRUG-CAUSED MUSCLE PAIN/LETHARGIC    Current Outpatient Medications on File Prior to Visit  Medication Sig Dispense Refill   albuterol (VENTOLIN HFA) 108 (90 Base) MCG/ACT inhaler Inhale 2 puffs into the lungs every 4 (four) hours as needed for wheezing or shortness of breath. 1 each 0   amLODipine (NORVASC) 5 MG tablet Take 1 tablet by mouth daily.     aspirin 81 MG EC tablet Take  by mouth.     buPROPion (WELLBUTRIN SR) 150 MG 12 hr tablet      Continuous Blood Gluc Sensor (FREESTYLE LIBRE 14 DAY SENSOR) MISC SMARTSIG:1 Each Topical Every 2 Weeks     fenofibrate (TRICOR) 145 MG tablet Take 1 tablet by mouth daily.     fluticasone (FLONASE) 50 MCG/ACT nasal spray Place 1 spray into both nostrils 2 (two) times daily. 16 g 0   glipiZIDE (  GLUCOTROL XL) 2.5 MG 24 hr tablet glipizide ER 2.5 mg tablet, extended release 24 hr     hydrochlorothiazide (HYDRODIURIL) 50 MG tablet Take 1 tablet (50 mg total) by mouth daily. 90 tablet 1   insulin degludec (TRESIBA) 200 UNIT/ML FlexTouch Pen Inject 80 Units into the skin daily.     levocetirizine (XYZAL) 5 MG tablet TAKE 1 TABLET BY MOUTH ONCE EVERY EVENING FOR ALLERGIES 90 tablet 2   levothyroxine (SYNTHROID) 125 MCG tablet Take 1 tablet by mouth every morning on an empty stomach with water only.  No food or other medications for 30 minutes. 90 tablet 0   metFORMIN (GLUCOPHAGE) 1000 MG tablet TAKE 1 TABLET BY MOUTH TWICE A DAY 180 tablet 1   montelukast (SINGULAIR) 10 MG tablet      NOVOLOG FLEXPEN 100 UNIT/ML FlexPen Inject 28 units before breakfast, inject 24 units beforelunch, inject 34 units before supper. Increase when blood sugar is over 150. Use 2 units more for every 50 over.  9   ramipril (ALTACE) 10 MG capsule TAKE 1 CAPSULE BY MOUTH 2 TIMES DAILY 180 capsule 1   rosuvastatin (CRESTOR) 10 MG tablet TAKE 1 TABLET BY MOUTH ONCE A DAY FOR CHOLESTEROL. 90 tablet 2   sitaGLIPtin (JANUVIA) 100 MG tablet      TRUEPLUS 5-BEVEL PEN NEEDLES 31G X 6 MM MISC      TRULICITY 1.5 SE/8.3TD SOPN Inject 1.5 mg as directed once a week.     dexamethasone (DECADRON) 4 MG tablet  (Patient not taking: Reported on 02/14/2022)     No current facility-administered medications on file prior to visit.    BP (!) 140/72   Pulse 84   Temp 97.7 F (36.5 C) (Oral)   Ht '5\' 10"'$  (1.778 m)   Wt (!) 312 lb (141.5 kg)   SpO2 98%   BMI 44.77 kg/m   Objective:   Physical Exam Constitutional:      Appearance: He is not ill-appearing.  HENT:     Nose: No mucosal edema.     Right Sinus: No maxillary sinus tenderness or frontal sinus tenderness.     Left Sinus: No maxillary sinus tenderness or frontal sinus tenderness.     Mouth/Throat:     Mouth: Mucous membranes are moist.  Eyes:     Conjunctiva/sclera: Conjunctivae normal.  Cardiovascular:     Rate and Rhythm: Normal rate and regular rhythm.  Pulmonary:     Effort: Pulmonary effort is normal.     Breath sounds: Normal breath sounds. No wheezing or rales.  Musculoskeletal:     Cervical back: Neck supple.  Skin:    General: Skin is warm and dry.           Assessment & Plan:   Problem List Items Addressed This Visit       Respiratory   Chronic obstructive pulmonary disease ([place]) - Primary   Relevant Medications   Spacer/Aero-Holding Chambers (AEROCHAMBER PLUS FLO-VU MEDIUM) MISC     Other   COVID-19    Reviewed Urgent Care notes and chest xray from July 2023.  Appears overall well today. Rx provided for aerochamber to use with inhaler. He will update if he continues to require albuterol more than 3 times weekly after a few more weeks.  Repeat chest xray pending today.  Return precautions provided.       Relevant Orders   DG Chest 2 View       Pleas Koch, NP

## 2022-02-14 NOTE — Assessment & Plan Note (Addendum)
Reviewed Urgent Care notes and chest xray from July 2023.  Appears overall well today. Rx provided for aerochamber to use with inhaler. He will update if he continues to require albuterol more than 3 times weekly after a few more weeks.  Repeat chest xray pending today.  Return precautions provided.

## 2022-02-14 NOTE — Patient Instructions (Addendum)
Use the aerochamber with your inhaler.  Complete xray(s) prior to leaving today. I will notify you of your results once received.  Schedule your physical with me for late October 2023.  It was a pleasure to see you today!

## 2022-02-15 ENCOUNTER — Telehealth: Payer: Self-pay

## 2022-02-15 NOTE — Telephone Encounter (Signed)
[place] Night - Client Nonclinical Telephone Record  [facility] Client [facility] Night - Client Client Site [place] - Night Provider Alma Friendly - NP Contact Type Call Who Is Calling Patient / Member / Family / Caregiver Caller Name Keilyn Nadal or Margarito Liner Phone Number [phone] Patient Name [name] or [name] Patient DOB 1951-04-20 Call Type Message Only Information Provided Reason for Call Request to Reschedule Office Appointment Initial Comment Caller states he received an email on my chart to make an apt states he was wanting it for 10/17 and it was put down for 08/17 and he would like to reschedule it because he says he gets his flu shot on the same day. Patient request to speak to RN No Additional Comment Office hours provided. States if they could just send him an email. Disp. Time Disposition Final User 02/14/2022 5:17:04 PM General Information Provided Yes Vena Rua Call Closed By: Vena Rua Transaction Date/Time: 02/14/2022 5:13:42 PM (ET

## 2022-02-27 ENCOUNTER — Other Ambulatory Visit: Payer: Self-pay | Admitting: Primary Care

## 2022-02-27 DIAGNOSIS — J449 Chronic obstructive pulmonary disease, unspecified: Secondary | ICD-10-CM

## 2022-03-02 ENCOUNTER — Other Ambulatory Visit: Payer: Self-pay | Admitting: Primary Care

## 2022-03-02 ENCOUNTER — Encounter: Payer: HMO | Admitting: Primary Care

## 2022-03-02 DIAGNOSIS — J309 Allergic rhinitis, unspecified: Secondary | ICD-10-CM

## 2022-03-07 ENCOUNTER — Telehealth: Payer: Self-pay

## 2022-03-07 NOTE — Progress Notes (Signed)
    Chronic Care Management Pharmacy Assistant   Name: [name]  MRN: [id number] DOB: Jun 22, 1951  Reason for Encounter: CCM (Eye Exam)    Contacted patient in regards to updated eye exam information.  Patient was offered eye exam at [facility]. Patient is interested in setting up a time to come to [facility] for an eye exam. Message has been sent to Alma Friendly, NP for a follow up phone call.   [name], CPP notified  Marijean Niemann, [place] Clinical Pharmacy Assistant [phone]

## 2022-03-29 LAB — HM DIABETES EYE EXAM

## 2022-04-05 ENCOUNTER — Encounter: Payer: Self-pay | Admitting: Primary Care

## 2022-04-18 ENCOUNTER — Ambulatory Visit: Payer: HMO | Admitting: Podiatry

## 2022-04-18 ENCOUNTER — Telehealth: Payer: Self-pay

## 2022-04-18 ENCOUNTER — Encounter: Payer: Self-pay | Admitting: Podiatry

## 2022-04-18 DIAGNOSIS — E119 Type 2 diabetes mellitus without complications: Secondary | ICD-10-CM | POA: Diagnosis not present

## 2022-04-18 DIAGNOSIS — M79675 Pain in left toe(s): Secondary | ICD-10-CM | POA: Diagnosis not present

## 2022-04-18 DIAGNOSIS — Z794 Long term (current) use of insulin: Secondary | ICD-10-CM

## 2022-04-18 DIAGNOSIS — L84 Corns and callosities: Secondary | ICD-10-CM

## 2022-04-18 DIAGNOSIS — B351 Tinea unguium: Secondary | ICD-10-CM

## 2022-04-18 DIAGNOSIS — M79674 Pain in right toe(s): Secondary | ICD-10-CM | POA: Diagnosis not present

## 2022-04-18 NOTE — Chronic Care Management (AMB) (Signed)
Patient completed the DM eye exam at the [facility] - negative for diabetic retinopathy.  [name], CPP notified  [name], [place]  [phone]

## 2022-04-18 NOTE — Progress Notes (Signed)
  Subjective:  Patient ID: [name], male    DOB: 1950-12-23,  MRN: [id number]  [name] presents to clinic today for:  Chief Complaint  Patient presents with   Follow-up    Patient is here today for his routine diabetic foot care. A.m. blood sugar at 143 01/02/22 last A1c at 7.2 PCP is Alma Friendly, NP; last seen on 03/06/22   New problem(s): None.   His wife is present during today's visit.  PCP is Pleas Koch, NP , and last visit was  February 14, 2022.  Allergies  Allergen Reactions   Atorvastatin     Other reaction(s): Headache   Other Other (See Comments)    STATIN DRUG-CAUSED MUSCLE PAIN/LETHARGIC   Review of Systems: Negative except as noted in the HPI.  Objective:  [name] is a pleasant 71 y.o. male morbidly obese in NAD. AAO x 3.  Vascular Examination: CFT <3 seconds b/l LE. Faintly palpable DP pulses b/l LE. Faintly palpable PT pulse(s) b/l LE. Pedal hair absent. No pain with calf compression b/l. No edema noted b/l LE. No ischemia or gangrene noted b/l LE. No cyanosis or clubbing noted b/l LE.  Dermatological Examination: Pedal integument with normal turgor, texture and tone BLE. No open wounds b/l LE. No interdigital macerations noted b/l LE. Toenails 1-5 bilaterally elongated, discolored, dystrophic, thickened, and crumbly with subungual debris and tenderness to dorsal palpation.   Hyperkeratotic lesion(s) submet head 2 right foot and submet head 5 right foot.  No erythema, no edema, no drainage, no fluctuance.  Musculoskeletal Examination: Muscle strength 5/5 to all lower extremity muscle groups bilaterally. No pain, crepitus or joint limitation noted with ROM bilateral LE. Hammertoe deformity noted 1-5 b/l.   Neurological Examination: Pt has subjective symptoms of neuropathy. Protective sensation intact 5/5 intact bilaterally with 10g monofilament b/l.  Assessment/Plan: 1. Pain due to onychomycosis of toenails of both feet   2. Callus    3. Type 2 diabetes mellitus without complication, with long-term current use of insulin (HCC)     No orders of the defined types were placed in this encounter.   -Patient's family member present. All questions/concerns addressed on today's visit. -Consent given for treatment as described below: -Examined patient. -Patient to continue soft, supportive shoe gear daily. -Toenails 1-5 b/l were debrided in length and girth with sterile nail nippers and dremel without iatrogenic bleeding.  -Callus(es) submet head 2 right foot and submet head 5 right foot pared utilizing sterile scalpel blade without complication or incident. Total number debrided =2. -Patient/POA to call should there be question/concern in the interim.   Return in about 3 months (around 07/19/2022).  Marzetta Board, DPM

## 2022-05-02 ENCOUNTER — Encounter: Payer: Self-pay | Admitting: Primary Care

## 2022-05-02 ENCOUNTER — Ambulatory Visit (INDEPENDENT_AMBULATORY_CARE_PROVIDER_SITE_OTHER): Payer: HMO | Admitting: Primary Care

## 2022-05-02 VITALS — BP 140/62 | HR 85 | Temp 98.1°F | Ht 70.0 in | Wt 315.0 lb

## 2022-05-02 DIAGNOSIS — E1165 Type 2 diabetes mellitus with hyperglycemia: Secondary | ICD-10-CM | POA: Diagnosis not present

## 2022-05-02 DIAGNOSIS — I2510 Atherosclerotic heart disease of native coronary artery without angina pectoris: Secondary | ICD-10-CM

## 2022-05-02 DIAGNOSIS — Z23 Encounter for immunization: Secondary | ICD-10-CM

## 2022-05-02 DIAGNOSIS — J449 Chronic obstructive pulmonary disease, unspecified: Secondary | ICD-10-CM

## 2022-05-02 DIAGNOSIS — E039 Hypothyroidism, unspecified: Secondary | ICD-10-CM | POA: Diagnosis not present

## 2022-05-02 DIAGNOSIS — I700 Atherosclerosis of aorta: Secondary | ICD-10-CM

## 2022-05-02 DIAGNOSIS — M170 Bilateral primary osteoarthritis of knee: Secondary | ICD-10-CM

## 2022-05-02 DIAGNOSIS — Z0000 Encounter for general adult medical examination without abnormal findings: Secondary | ICD-10-CM | POA: Diagnosis not present

## 2022-05-02 DIAGNOSIS — Z794 Long term (current) use of insulin: Secondary | ICD-10-CM | POA: Diagnosis not present

## 2022-05-02 DIAGNOSIS — E7800 Pure hypercholesterolemia, unspecified: Secondary | ICD-10-CM

## 2022-05-02 DIAGNOSIS — Z125 Encounter for screening for malignant neoplasm of prostate: Secondary | ICD-10-CM | POA: Diagnosis not present

## 2022-05-02 DIAGNOSIS — J309 Allergic rhinitis, unspecified: Secondary | ICD-10-CM

## 2022-05-02 DIAGNOSIS — Z86010 Personal history of colonic polyps: Secondary | ICD-10-CM

## 2022-05-02 LAB — HEMOGLOBIN A1C: Hgb A1c MFr Bld: 7.4 % — ABNORMAL HIGH (ref 4.6–6.5)

## 2022-05-02 LAB — LIPID PANEL
Cholesterol: 96 mg/dL (ref 0–200)
HDL: 27.6 mg/dL — ABNORMAL LOW (ref >39.00)
LDL Cholesterol: 30 mg/dL (ref 0–99)
NonHDL: 68.59
Total CHOL/HDL Ratio: 3
Triglycerides: 194 mg/dL — ABNORMAL HIGH (ref 0.0–149.0)
VLDL: 38.8 mg/dL (ref 0.0–40.0)

## 2022-05-02 LAB — COMPREHENSIVE METABOLIC PANEL
ALT: 30 U/L (ref 0–53)
AST: 33 U/L (ref 0–37)
Albumin: 4.6 g/dL (ref 3.5–5.2)
Alkaline Phosphatase: 49 U/L (ref 39–117)
BUN: 14 mg/dL (ref 6–23)
CO2: 32 mEq/L (ref 19–32)
Calcium: 10.6 mg/dL — ABNORMAL HIGH (ref 8.4–10.5)
Chloride: 99 mEq/L (ref 96–112)
Creatinine, Ser: 0.95 mg/dL (ref 0.40–1.50)
GFR: 81.61 mL/min (ref >60.00)
Glucose, Bld: 108 mg/dL — ABNORMAL HIGH (ref 70–99)
Potassium: 4.1 mEq/L (ref 3.5–5.1)
Sodium: 140 mEq/L (ref 135–145)
Total Bilirubin: 0.4 mg/dL (ref 0.2–1.2)
Total Protein: 7.3 g/dL (ref 6.0–8.3)

## 2022-05-02 LAB — MICROALBUMIN / CREATININE URINE RATIO
Creatinine,U: 42.1 mg/dL
Microalb Creat Ratio: 1.7 mg/g (ref 0.0–30.0)
Microalb, Ur: <0.7 mg/dL (ref 0.0–1.9)

## 2022-05-02 LAB — PSA, MEDICARE: PSA: 1.09 ng/ml (ref 0.10–4.00)

## 2022-05-02 LAB — TSH: TSH: 3.78 u[IU]/mL (ref 0.35–5.50)

## 2022-05-02 NOTE — Patient Instructions (Addendum)
Stop by the lab prior to leaving today. I will notify you of your results once received.   Please notify me if you would like to see the orthopedic doctor for your knees.   It was a pleasure to see you today!  Preventive Care 96 Years and Older, Male Preventive care refers to lifestyle choices and visits with your health care provider that can promote health and wellness. Preventive care visits are also called wellness exams. What can I expect for my preventive care visit? Counseling During your preventive care visit, your health care provider may ask about your: Medical history, including: Past medical problems. Family medical history. History of falls. Current health, including: Emotional well-being. Home life and relationship well-being. Sexual activity. Memory and ability to understand (cognition). Lifestyle, including: Alcohol, nicotine or tobacco, and drug use. Access to firearms. Diet, exercise, and sleep habits. Work and work Statistician. Sunscreen use. Safety issues such as seatbelt and bike helmet use. Physical exam Your health care provider will check your: Height and weight. These may be used to calculate your BMI (body mass index). BMI is a measurement that tells if you are at a healthy weight. Waist circumference. This measures the distance around your waistline. This measurement also tells if you are at a healthy weight and may help predict your risk of certain diseases, such as type 2 diabetes and high blood pressure. Heart rate and blood pressure. Body temperature. Skin for abnormal spots. What immunizations do I need?  Vaccines are usually given at various ages, according to a schedule. Your health care provider will recommend vaccines for you based on your age, medical history, and lifestyle or other factors, such as travel or where you work. What tests do I need? Screening Your health care provider may recommend screening tests for certain conditions. This  may include: Lipid and cholesterol levels. Diabetes screening. This is done by checking your blood sugar (glucose) after you have not eaten for a while (fasting). Hepatitis C test. Hepatitis B test. HIV (human immunodeficiency virus) test. STI (sexually transmitted infection) testing, if you are at risk. Lung cancer screening. Colorectal cancer screening. Prostate cancer screening. Abdominal aortic aneurysm (AAA) screening. You may need this if you are a current or former smoker. Talk with your health care provider about your test results, treatment options, and if necessary, the need for more tests. Follow these instructions at home: Eating and drinking  Eat a diet that includes fresh fruits and vegetables, whole grains, lean protein, and low-fat dairy products. Limit your intake of foods with high amounts of sugar, saturated fats, and salt. Take vitamin and mineral supplements as recommended by your health care provider. Do not drink alcohol if your health care provider tells you not to drink. If you drink alcohol: Limit how much you have to 0-2 drinks a day. Know how much alcohol is in your drink. In the [place] one drink equals one 12 oz bottle of beer (355 mL), one 5 oz glass of wine (148 mL), or one 1 oz glass of hard liquor (44 mL). Lifestyle Brush your teeth every morning and night with fluoride toothpaste. Floss one time each day. Exercise for at least 30 minutes 5 or more days each week. Do not use any products that contain nicotine or tobacco. These products include cigarettes, chewing tobacco, and vaping devices, such as e-cigarettes. If you need help quitting, ask your health care provider. Do not use drugs. If you are sexually active, practice safe sex. Use a condom  or other form of protection to prevent STIs. Take aspirin only as told by your health care provider. Make sure that you understand how much to take and what form to take. Work with your health care provider to find  out whether it is safe and beneficial for you to take aspirin daily. Ask your health care provider if you need to take a cholesterol-lowering medicine (statin). Find healthy ways to manage stress, such as: Meditation, yoga, or listening to music. Journaling. Talking to a trusted person. Spending time with friends and family. Safety Always wear your seat belt while driving or riding in a vehicle. Do not drive: If you have been drinking alcohol. Do not ride with someone who has been drinking. When you are tired or distracted. While texting. If you have been using any mind-altering substances or drugs. Wear a helmet and other protective equipment during sports activities. If you have firearms in your house, make sure you follow all gun safety procedures. Minimize exposure to UV radiation to reduce your risk of skin cancer. What's next? Visit your health care provider once a year for an annual wellness visit. Ask your health care provider how often you should have your eyes and teeth checked. Stay up to date on all vaccines. This information is not intended to replace advice given to you by your health care provider. Make sure you discuss any questions you have with your health care provider. Document Revised: 12/29/2020 Document Reviewed: 12/29/2020 Elsevier Patient Education  [place].

## 2022-05-02 NOTE — Assessment & Plan Note (Addendum)
Evident on CT chest from January 2023.  Discussed with patient today.  He appears to be asymptomatic. I did offer cardiology referral for evaluation given these findings, he currently declines for now but will update if he develops any of the symptoms we discussed.  Reviewed lipid panel from January 2023 per endocrinology through care everywhere

## 2022-05-02 NOTE — Assessment & Plan Note (Signed)
Immunizations UTD. Influenza vaccine provided today. PSA due and pending. Colonoscopy up-to-date, due 2025  Discussed the importance of a healthy diet and regular exercise in order for weight loss, and to reduce the risk of further co-morbidity.  Exam stable. Labs pending.  Follow up in 1 year for repeat physical.

## 2022-05-02 NOTE — Assessment & Plan Note (Addendum)
Reviewed A1C from June 2023 per endocrinology through [place].  Continue Tresiba 70 units daily Continue NovoLog 34 units in a.m., 24 units in afternoon, 34 units in p.m.  Continue metformin at 1000 mg twice daily.  He is waiting on Trulicity 1.5 mg to return to stock. Follow-up with endocrinology as scheduled.

## 2022-05-02 NOTE — Assessment & Plan Note (Signed)
Chronic and ongoing.  Injections have become cost prohibitive.  Offered orthopedic referral and he kindly declines.  He also declines PT.   Strongly recommended weight loss.

## 2022-05-02 NOTE — Progress Notes (Signed)
Subjective:    Patient ID: [name], male    DOB: 21-Apr-1951, 71 y.o.   MRN: [id number]  HPI  [name] is a very pleasant 71 y.o. male who presents today for complete physical and follow up of chronic conditions.  Immunizations: -Tetanus: 2016 -Influenza: Due today -Shingles: Completed Shingrix -Pneumonia: Prevnar 40 in 2019, Pneumovax 23 in 2017  Diet: [place].  Exercise: No regular exercise. Some use of recumbent bicycle.   Eye exam: Completes annually. Dental exam: Completed years ago.   Colonoscopy: Completed in 2022, due 2025 Lung Cancer Screening: Completed in January 2023, scheduled for January 2024  PSA: Due  He is checking his BP at home which runs 120's-140's/60's-80's.   BP Readings from Last 3 Encounters:  05/02/22 (!) 150/74  02/14/22 (!) 140/72  01/21/22 (!) 157/79    Wt Readings from Last 3 Encounters:  05/02/22 (!) 315 lb (142.9 kg)  02/14/22 (!) 312 lb (141.5 kg)  11/07/21 (!) 313 lb (142 kg)         Review of Systems  Constitutional:  Negative for unexpected weight change.  HENT:  Negative for rhinorrhea.   Respiratory:  Negative for cough and shortness of breath.   Cardiovascular:  Negative for chest pain.  Gastrointestinal:  Negative for constipation and diarrhea.  Genitourinary:  Negative for difficulty urinating.  Musculoskeletal:  Negative for arthralgias and myalgias.  Skin:  Negative for rash.  Allergic/Immunologic: Negative for environmental allergies.  Neurological:  Negative for dizziness and headaches.  Psychiatric/Behavioral:  The patient is not nervous/anxious.          Past Medical History:  Diagnosis Date   Adenomatous polyp    Aortic atherosclerosis ([place]) 06/16/2020   Arthritis    knee   Chronic obstructive lung disease ([place])    Colon polyp 2008   COPD with emphysema ([place])    Diabetes mellitus    Emphysema lung ([place]) 06/16/2020   Emphysema lung (HCC)    Emphysema of lung ([place]) 2016    Hypercholesteremia    Hyperlipemia    Hypertension    Hypothyroidism    Observed sleep apnea    Oxygen deficiency 2016   Sleep apnea    Statin myopathy     Social History   Socioeconomic History   Marital status: Married    Spouse name: [name]   Number of children: 3   Years of education: Not on file   Highest education level: Not on file  Occupational History   Occupation: retired  Tobacco Use   Smoking status: Former    Packs/day: 2.00    Years: 35.00    Total pack years: 70.00    Types: Cigarettes, E-cigarettes    Quit date: 12/11/2014    Years since quitting: 7.3   Smokeless tobacco: Never   Tobacco comments:    I don't smoke anymore.  Vaping Use   Vaping Use: Never used  Substance and Sexual Activity   Alcohol use: No   Drug use: No   Sexual activity: Not Currently    Birth control/protection: None  Other Topics Concern   Not on file  Social History Narrative   Married.   3 children. 2 grandchildren.   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    One son lives next door, another stays with them sometimes   Social Determinants of Health   Financial Resource Strain: Low Risk  (10/14/2021)  Overall Financial Resource Strain (CARDIA)    Difficulty of Paying Living Expenses: Not very hard  Food Insecurity: No Food Insecurity (10/14/2021)   Hunger Vital Sign    Worried About Running Out of Food in the Last Year: Never true    Ran Out of Food in the Last Year: Never true  Transportation Needs: No Transportation Needs (10/14/2021)   PRAPARE - Hydrologist (Medical): No    Lack of Transportation (Non-Medical): No  Physical Activity: Insufficiently Active (10/14/2021)   Exercise Vital Sign    Days of Exercise per Week: 7 days    Minutes of Exercise per Session: 10 min  Stress: No Stress Concern Present (10/14/2021)   [place]    Feeling of Stress : Not at all  Social Connections: [place] (10/14/2021)   Social Connection and Isolation Panel [NHANES]    Frequency of Communication with Friends and Family: More than three times a week    Frequency of Social Gatherings with Friends and Family: More than three times a week    Attends Religious Services: More than 4 times per year    Active Member of Clubs or Organizations: Yes    Attends Archivist Meetings: More than 4 times per year    Marital Status: Married  Human resources officer Violence: Not At Risk (10/14/2021)   Humiliation, Afraid, Rape, and Kick questionnaire    Fear of Current or Ex-Partner: No    Emotionally Abused: No    Physically Abused: No    Sexually Abused: No    Past Surgical History:  Procedure Laterality Date   COLONOSCOPY  2008, 2016   Dr Bary Castilla   COLONOSCOPY WITH PROPOFOL N/A 10/03/2017   Procedure: COLONOSCOPY WITH PROPOFOL;  Surgeon: Robert Bellow, MD;  Location: [facility] ENDOSCOPY;  Service: Endoscopy;  Laterality: N/A;   COLONOSCOPY WITH PROPOFOL N/A 10/08/2020   Procedure: COLONOSCOPY WITH PROPOFOL;  Surgeon: Robert Bellow, MD;  Location: [facility] ENDOSCOPY;  Service: Endoscopy;  Laterality: N/A;   DERMATOFIBROMA  03/26/2015   Procedure: Excision mass left leg;  Surgeon: Robert Bellow, MD;  Location: [facility] ORS;  Service: General;  Laterality: N/A;   LIPOMA EXCISION N/A 03/26/2015       TONSILLECTOMY      Family History  Problem Relation Age of Onset   Stroke Mother    Diabetes Mother    Early death Mother    Stroke Father    Arthritis Father    Hypertension Father    Diabetes Other    Other Daughter 25       precancerous colon polyp    Diabetes Maternal Grandmother    Diabetes Paternal Grandmother     Allergies  Allergen Reactions   Atorvastatin     Other reaction(s): Headache   Other Other (See Comments)    STATIN DRUG-CAUSED MUSCLE PAIN/LETHARGIC    Current Outpatient  Medications on File Prior to Visit  Medication Sig Dispense Refill   albuterol (VENTOLIN HFA) 108 (90 Base) MCG/ACT inhaler INHALE 2 PUFFS INTO THE LUNGS EVERY 4 HOURS AS NEEDED FOR WHEEZING OR SHORTNESS OF BREATH 18 g 0   amLODipine (NORVASC) 5 MG tablet Take 1 tablet by mouth daily.     aspirin 81 MG EC tablet Take by mouth.     Continuous Blood Gluc Sensor (FREESTYLE LIBRE 14 DAY SENSOR) MISC SMARTSIG:1 Each Topical Every 2 Weeks     fenofibrate (TRICOR) 145  MG tablet Take 1 tablet by mouth daily.     fluticasone (FLONASE) 50 MCG/ACT nasal spray Place 1 spray into both nostrils 2 (two) times daily. 16 g 0   hydrochlorothiazide (HYDRODIURIL) 50 MG tablet Take 1 tablet (50 mg total) by mouth daily. 90 tablet 1   insulin degludec (TRESIBA) 200 UNIT/ML FlexTouch Pen Inject 80 Units into the skin daily.     levocetirizine (XYZAL) 5 MG tablet TAKE 1 TABLET BY MOUTH ONCE EVERY EVENING FOR ALLERGIES 90 tablet 0   levothyroxine (SYNTHROID) 125 MCG tablet Take 1 tablet by mouth every morning on an empty stomach with water only.  No food or other medications for 30 minutes. 90 tablet 0   metFORMIN (GLUCOPHAGE) 1000 MG tablet TAKE 1 TABLET BY MOUTH TWICE A DAY 180 tablet 1   montelukast (SINGULAIR) 10 MG tablet      NOVOLOG FLEXPEN 100 UNIT/ML FlexPen Inject 28 units before breakfast, inject 24 units beforelunch, inject 34 units before supper. Increase when blood sugar is over 150. Use 2 units more for every 50 over.  9   ramipril (ALTACE) 10 MG capsule TAKE 1 CAPSULE BY MOUTH 2 TIMES DAILY 180 capsule 1   rosuvastatin (CRESTOR) 10 MG tablet TAKE 1 TABLET BY MOUTH ONCE A DAY FOR CHOLESTEROL. 90 tablet 2   Spacer/Aero-Holding Chambers (AEROCHAMBER PLUS FLO-VU MEDIUM) MISC Use daily as needed with inhaler 1 each 0   TRUEPLUS 5-BEVEL PEN NEEDLES 31G X 6 MM MISC      TRULICITY 1.5 LK/4.4WN SOPN Inject 1.5 mg as directed once a week.     No current facility-administered medications on file prior to visit.     BP (!) 150/74   Pulse 85   Temp 98.1 F (36.7 C) (Temporal)   Ht '5\' 10"'$  (1.778 m)   Wt (!) 315 lb (142.9 kg)   SpO2 95%   BMI 45.20 kg/m  Objective:   Physical Exam HENT:     Right Ear: Tympanic membrane and ear canal normal.     Left Ear: Tympanic membrane and ear canal normal.     Nose: Nose normal.     Right Sinus: No maxillary sinus tenderness or frontal sinus tenderness.     Left Sinus: No maxillary sinus tenderness or frontal sinus tenderness.  Eyes:     Conjunctiva/sclera: Conjunctivae normal.  Neck:     Thyroid: No thyromegaly.     Vascular: No carotid bruit.  Cardiovascular:     Rate and Rhythm: Normal rate and regular rhythm.     Heart sounds: Normal heart sounds.  Pulmonary:     Effort: Pulmonary effort is normal.     Breath sounds: Normal breath sounds. No wheezing or rales.  Abdominal:     General: Bowel sounds are normal.     Palpations: Abdomen is soft.     Tenderness: There is no abdominal tenderness.  Musculoskeletal:     Cervical back: Neck supple.     Right knee: Decreased range of motion.     Left knee: Decreased range of motion.     Comments: Difficulty getting up to exam table. Ambulates decently well with cane.   Skin:    General: Skin is warm and dry.  Neurological:     Mental Status: He is alert and oriented to person, place, and time.     Cranial Nerves: No cranial nerve deficit.     Deep Tendon Reflexes: Reflexes are normal and symmetric.  Psychiatric:  Mood and Affect: Mood normal.           Assessment & Plan:   Problem List Items Addressed This Visit       Cardiovascular and Mediastinum   Aortic atherosclerosis ([place])    Reviewed CT chest from January 2023.  Continue Crestor 10 mg daily, continue Tricor 145 mg daily. Repeat lipid panel pending.        CAD (coronary artery disease)    Evident on CT chest from January 2023.  Discussed with patient today.  He appears to be asymptomatic. I did offer  cardiology referral for evaluation given these findings, he currently declines for now but will update if he develops any of the symptoms we discussed.  Reviewed lipid panel from January 2023 per endocrinology through care everywhere        Respiratory   Allergic rhinitis    Controlled.  Continue montelukast 10 mg daily, Xyzal 5 mg daily.      Chronic obstructive pulmonary disease (HCC)    Controlled.  Continue albuterol inhaler PRN.          Endocrine   Hypothyroidism    Following with endocrinology. TSH reviewed from June 2023 per care everywhere.'  Continue levothyroxine 125 mcg daily.      Relevant Orders   TSH   Diabetes mellitus ([place])    Reviewed A1C from June 2023 per endocrinology through [facility].  Continue Tresiba 70 units daily Continue NovoLog 34 units in a.m., 24 units in afternoon, 34 units in p.m.  Continue metformin at 1000 mg twice daily.  He is waiting on Trulicity 1.5 mg to return to stock. Follow-up with endocrinology as scheduled.      Relevant Orders   Microalbumin/Creatinine Ratio, Urine   Hemoglobin A1c     Musculoskeletal and Integument   Osteoarthritis of both knees    Chronic and ongoing.  Injections have become cost prohibitive.  Offered orthopedic referral and he kindly declines.  He also declines PT.   Strongly recommended weight loss.         Other   History of colonic polyps    Colonoscopy up-to-date. Recall in 2025.      Hypercholesterolemia    LDL at goal.  Reviewed lipid panel from January 2023 per endocrinology through care everywhere.  LDL goal less than 70. Continue fenofibrate 145 mg daily, rosuvastatin 10 mg daily.      Relevant Orders   Lipid panel   Comprehensive metabolic panel   Preventative health care - Primary    Immunizations UTD. Influenza vaccine provided today. PSA due and pending. Colonoscopy up-to-date, due 2025  Discussed the importance of a healthy diet and regular exercise in  order for weight loss, and to reduce the risk of further co-morbidity.  Exam stable. Labs pending.  Follow up in 1 year for repeat physical.       Other Visit Diagnoses     Screening for prostate cancer       Relevant Orders   PSA, Medicare          Juan Koch, NP

## 2022-05-02 NOTE — Assessment & Plan Note (Addendum)
Following with endocrinology. TSH reviewed from June 2023 per care everywhere.'  Continue levothyroxine 125 mcg daily.

## 2022-05-02 NOTE — Assessment & Plan Note (Addendum)
Reviewed CT chest from January 2023.  Continue Crestor 10 mg daily, continue Tricor 145 mg daily. Repeat lipid panel pending.

## 2022-05-02 NOTE — Assessment & Plan Note (Addendum)
LDL at goal.  Reviewed lipid panel from January 2023 per endocrinology through care everywhere.  LDL goal less than 70. Continue fenofibrate 145 mg daily, rosuvastatin 10 mg daily.

## 2022-05-02 NOTE — Assessment & Plan Note (Signed)
Colonoscopy up-to-date. Recall in 2025.

## 2022-05-02 NOTE — Assessment & Plan Note (Signed)
Controlled.  Continue montelukast 10 mg daily, Xyzal 5 mg daily.

## 2022-05-02 NOTE — Assessment & Plan Note (Signed)
Controlled.   Continue albuterol inhaler PRN. 

## 2022-05-03 NOTE — Telephone Encounter (Signed)
Spoke with patient, informed that we did not order a b12 test with his other labs yesterday. He states he will speak to endocrinology about testing for this.

## 2022-05-22 ENCOUNTER — Other Ambulatory Visit: Payer: Self-pay | Admitting: Primary Care

## 2022-05-22 DIAGNOSIS — I2510 Atherosclerotic heart disease of native coronary artery without angina pectoris: Secondary | ICD-10-CM

## 2022-05-22 DIAGNOSIS — E7800 Pure hypercholesterolemia, unspecified: Secondary | ICD-10-CM

## 2022-05-25 ENCOUNTER — Telehealth: Payer: Self-pay

## 2022-05-25 NOTE — Chronic Care Management (AMB) (Signed)
    Chronic Care Management Pharmacy Assistant   Name: [name]  MRN: [id number] DOB: 16-May-1951  Reason for Encounter: Reminder Call     Hospital visits:  None in previous 6 months 01/21/22-[facility]- cough,congestion suspect covid-19,Home COVID test positive Chest x-ray concerning for possible pneumonia likely secondary to COVID Given patient's comorbidities will cover with doxycycline along with a very low-dose of prednisone.   Also encouraged to use albuterol inhaler to keep lungs open and to relieve chest tightness. Paxlovid given -discharged  Medications: Outpatient Encounter Medications as of 05/25/2022  Medication Sig Note   albuterol (VENTOLIN HFA) 108 (90 Base) MCG/ACT inhaler INHALE 2 PUFFS INTO THE LUNGS EVERY 4 HOURS AS NEEDED FOR WHEEZING OR SHORTNESS OF BREATH    amLODipine (NORVASC) 5 MG tablet Take 1 tablet by mouth daily.    aspirin 81 MG EC tablet Take by mouth.    Continuous Blood Gluc Sensor (FREESTYLE LIBRE 14 DAY SENSOR) MISC SMARTSIG:1 Each Topical Every 2 Weeks    fenofibrate (TRICOR) 145 MG tablet Take 1 tablet by mouth daily.    fluticasone (FLONASE) 50 MCG/ACT nasal spray Place 1 spray into both nostrils 2 (two) times daily.    hydrochlorothiazide (HYDRODIURIL) 50 MG tablet Take 1 tablet (50 mg total) by mouth daily.    insulin degludec (TRESIBA) 200 UNIT/ML FlexTouch Pen Inject 80 Units into the skin daily. 10/14/2021: 70 U QHS   levocetirizine (XYZAL) 5 MG tablet TAKE 1 TABLET BY MOUTH ONCE EVERY EVENING FOR ALLERGIES    levothyroxine (SYNTHROID) 125 MCG tablet Take 1 tablet by mouth every morning on an empty stomach with water only.  No food or other medications for 30 minutes.    metFORMIN (GLUCOPHAGE) 1000 MG tablet TAKE 1 TABLET BY MOUTH TWICE A DAY    montelukast (SINGULAIR) 10 MG tablet     NOVOLOG FLEXPEN 100 UNIT/ML FlexPen Inject 28 units before breakfast, inject 24 units beforelunch, inject 34 units before supper. Increase when  blood sugar is over 150. Use 2 units more for every 50 over.    ramipril (ALTACE) 10 MG capsule TAKE 1 CAPSULE BY MOUTH 2 TIMES DAILY    rosuvastatin (CRESTOR) 10 MG tablet TAKE 1 TABLET BY MOUTH ONCE A DAY FOR CHOLESTEROL.    Spacer/Aero-Holding Chambers (AEROCHAMBER PLUS FLO-VU MEDIUM) MISC Use daily as needed with inhaler    TRUEPLUS 5-BEVEL PEN NEEDLES 31G X 6 MM MISC     TRULICITY 1.5 PX/1.0GY SOPN Inject 1.5 mg as directed once a week.    No facility-administered encounter medications on file as of 05/25/2022.    [name] was contacted to remind of upcoming telephone visit with [name] on 05/30/22 at 8:45. Patient was reminded to have any blood glucose and blood pressure readings available for review at appointment.   Patient confirmed appointment.  Are you having any problems with your medications? No   Do you have any concerns you like to discuss with the pharmacist? No  CCM referral has been placed prior to visit?  No   Star Rating Drugs: Medication:  Last Fill: Day Supply Tresiba  03/13/22 77 Metformin '100mg'$  02/20/22  90 Ramipril '10mg'$   01/20/22  90 Rosuvastatin '10mg'$  12/24/46  90 Trulicity  11/17/60  [name], CPP notified  Avel Sensor, [place]  [phone]

## 2022-05-30 ENCOUNTER — Ambulatory Visit: Payer: HMO | Admitting: Pharmacist

## 2022-05-30 VITALS — BP 123/82

## 2022-05-30 DIAGNOSIS — Z794 Long term (current) use of insulin: Secondary | ICD-10-CM

## 2022-05-30 DIAGNOSIS — I10 Essential (primary) hypertension: Secondary | ICD-10-CM

## 2022-05-30 DIAGNOSIS — E7800 Pure hypercholesterolemia, unspecified: Secondary | ICD-10-CM

## 2022-05-30 DIAGNOSIS — I700 Atherosclerosis of aorta: Secondary | ICD-10-CM

## 2022-05-30 NOTE — Progress Notes (Unsigned)
Chronic Care Management Pharmacy Note  06/07/2022 Name:  [name] MRN:  [id number] DOB:  December 20, 1950  Summary: -Reviewed medications; pt affirms compliance as prescribed -DM: A1c 7.4% (04/2022) relatively controlled; he follows with Endocrine regularly and wears Freestyle Libre -HTN: BP at home is at goal (123/82)  Recommendations/Changes made from today's visit: -No med changes  Plan: -[place] will call patient 6 months for general update -Pharmacist follow up PRN -PCP annual due 04/2023    Subjective: [name] is an 71 y.o. year old male who is a primary patient of Pleas Koch, NP.  The CCM team was consulted for assistance with disease management and care coordination needs.    Engaged with patient by telephone for follow up visit in response to provider referral for pharmacy case management and/or care coordination services.   Consent to Services:  The patient was given information about Chronic Care Management services, agreed to services, and gave verbal consent prior to initiation of services.  Please see initial visit note for detailed documentation.   Patient Care Team: Pleas Koch, NP as PCP - General (Internal Medicine) Bary Castilla Forest Gleason, MD (General Surgery) Gabriel Carina Betsey Holiday, MD as Physician Assistant (Endocrinology) Marzetta Board, DPM as Consulting Physician (Podiatry) Little, Olevia Bowens (Inactive) (Orthotics) Charlton Haws, [facility] as Pharmacist (Pharmacist)  Recent office visits: 05/02/22 NP Allie Bossier OV: annual - A1c 7.4; d/c Januvia, glipizide, bupropion (pt not taking). CAD on CT, offered cardiology referral but pt declined.  02/14/22 NP Allie Bossier OV: covid/COPD. Rx spacer.   Recent consult visits: 05/15/22 Dr Doran Clay (Endocrine): no med changes  Hospital visits: None in previous 6 months   Objective:  Lab Results  Component Value Date   CREATININE 0.95 05/02/2022   BUN 14 05/02/2022   GFR  81.61 05/02/2022   GFRNONAA 76 02/19/2015   GFRAA 88 02/19/2015   NA 140 05/02/2022   K 4.1 05/02/2022   CALCIUM 10.6 (H) 05/02/2022   CO2 32 05/02/2022   GLUCOSE 108 (H) 05/02/2022    Lab Results  Component Value Date/Time   HGBA1C 7.4 (H) 05/02/2022 11:50 AM   HGBA1C 7.2 01/02/2022 12:00 AM   HGBA1C 7.6 09/07/2020 12:00 AM   GFR 81.61 05/02/2022 11:50 AM   GFR 74.02 06/01/2016 07:50 AM   MICROALBUR <0.7 05/02/2022 11:50 AM   MICROALBUR 1.1 06/01/2016 07:50 AM    Last diabetic Eye exam:  Lab Results  Component Value Date/Time   HMDIABEYEEXA No Retinopathy 03/29/2022 12:00 AM    Last diabetic Foot exam: No results found for: "HMDIABFOOTEX"   Lab Results  Component Value Date   CHOL 96 05/02/2022   HDL 27.60 (L) 05/02/2022   LDLCALC 30 05/02/2022   TRIG 194.0 (H) 05/02/2022   CHOLHDL 3 05/02/2022       Latest Ref Rng & Units 05/02/2022   11:50 AM 04/27/2021   10:08 AM 06/01/2016    7:50 AM  Hepatic Function  Total Protein 6.0 - 8.3 g/dL 7.3  7.2  7.1   Albumin 3.5 - 5.2 g/dL 4.6  4.6  4.5   AST 0 - 37 U/L 33  29  33   ALT 0 - 53 U/L 30  27  43   Alk Phosphatase 39 - 117 U/L 49  47  46   Total Bilirubin 0.2 - 1.2 mg/dL 0.4  0.5  0.3   Bilirubin, Direct 0.0 - 0.3 mg/dL  0.1      Lab  Results  Component Value Date/Time   TSH 3.78 05/02/2022 11:50 AM   TSH 5.88 (H) 04/27/2021 10:08 AM   FREET4 0.89 07/04/2016 08:44 AM       Latest Ref Rng & Units 02/19/2015    9:23 AM 01/12/2012    7:09 PM  CBC  WBC 3.4 - 10.8 x10E3/uL 8.1  6.9   Hemoglobin 12.6 - 17.7 g/dL 14.2  13.6   Hematocrit 37.5 - 51.0 % 41.3  39.1   Platelets 150 - 379 x10E3/uL 269  193     No results found for: "VD25OH", "VITAMINB12"  Clinical ASCVD: Yes  The ASCVD Risk score (Arnett DK, et al., 2019) failed to calculate for the following reasons:   The valid total cholesterol range is 130 to 320 mg/dL       10/14/2021    1:26 PM 10/26/2020   10:12 AM 10/23/2019   10:40 AM  Depression screen  PHQ 2/9  Decreased Interest 0 0 0  Down, Depressed, Hopeless 0 0 0  PHQ - 2 Score 0 0 0  Altered sleeping  0   Tired, decreased energy  1   Change in appetite  1   Feeling bad or failure about yourself   0   Trouble concentrating  0   Moving slowly or fidgety/restless  0   Suicidal thoughts  0   PHQ-9 Score  2     Social History   Tobacco Use  Smoking Status Former   Packs/day: 2.00   Years: 35.00   Total pack years: 70.00   Types: Cigarettes, E-cigarettes   Quit date: 12/11/2014   Years since quitting: 7.4  Smokeless Tobacco Never  Tobacco Comments   I don't smoke anymore.   BP Readings from Last 3 Encounters:  05/30/22 123/82  05/02/22 (!) 140/62  02/14/22 (!) 140/72   Pulse Readings from Last 3 Encounters:  05/02/22 85  02/14/22 84  01/21/22 91   Wt Readings from Last 3 Encounters:  05/02/22 (!) 315 lb (142.9 kg)  02/14/22 (!) 312 lb (141.5 kg)  11/07/21 (!) 313 lb (142 kg)   BMI Readings from Last 3 Encounters:  05/02/22 45.20 kg/m  02/14/22 44.77 kg/m  11/07/21 44.91 kg/m    Assessment/Interventions: Review of patient past medical history, allergies, medications, health status, including review of consultants reports, laboratory and other test data, was performed as part of comprehensive evaluation and provision of chronic care management services.   SDOH:  (Social Determinants of Health) assessments and interventions performed: No SDOH Interventions    Flowsheet Row Clinical Support from 10/14/2021 in [place] at [place] Interventions   Food Insecurity Interventions Intervention Not Indicated  Housing Interventions Intervention Not Indicated  Transportation Interventions Intervention Not Indicated  Financial Strain Interventions Intervention Not Indicated  Physical Activity Interventions Intervention Not Indicated, Patient Refused  Stress Interventions Intervention Not Indicated  Social Connections Interventions Intervention Not  Indicated      SDOH Screenings   Food Insecurity: No Food Insecurity (10/14/2021)  Housing: Low Risk  (10/14/2021)  Transportation Needs: No Transportation Needs (10/14/2021)  Alcohol Screen: Low Risk  (10/14/2021)  Depression (PHQ2-9): Low Risk  (10/14/2021)  Financial Resource Strain: Low Risk  (10/14/2021)  Physical Activity: Insufficiently Active (10/14/2021)  Social Connections: Socially Integrated (10/14/2021)  Stress: No Stress Concern Present (10/14/2021)  Tobacco Use: Medium Risk (06/07/2022)    CCM Care Plan  Allergies  Allergen Reactions   Atorvastatin     Other reaction(s): Headache  Other Other (See Comments)    STATIN DRUG-CAUSED MUSCLE PAIN/LETHARGIC    Medications Reviewed Today     Reviewed by Charlton Haws, [facility] (Pharmacist) on 05/30/22 at 1257  Med List Status: <None>   Medication Order Taking? Sig Documenting Provider Last Dose Status Informant  albuterol (VENTOLIN HFA) 108 (90 Base) MCG/ACT inhaler [id number] Yes INHALE 2 PUFFS INTO THE LUNGS EVERY 4 HOURS AS NEEDED FOR WHEEZING OR SHORTNESS OF BREATH Pleas Koch, NP Taking Active   amLODipine (NORVASC) 5 MG tablet [id number] Yes Take 1 tablet by mouth daily. [provider] Taking Active   aspirin 81 MG EC tablet [id number] Yes Take by mouth. [provider] Taking Active   Continuous Blood Gluc Receiver (FREESTYLE LIBRE 2 READER) [place] [id number] Yes by Does not apply route. [provider] Taking Active   Continuous Blood Gluc Sensor (FREESTYLE LIBRE 2 SENSOR) [place] [id number] Yes by Does not apply route. [provider] Taking Active   fenofibrate (TRICOR) 145 MG tablet [id number] Yes Take 1 tablet by mouth daily. [provider] Taking Active   fluticasone (FLONASE) 50 MCG/ACT nasal spray [id number] Yes Place 1 spray into both nostrils 2 (two) times daily. Pleas Koch, NP Taking Active   hydrochlorothiazide (HYDRODIURIL) 50 MG tablet [id number] Yes Take 1  tablet (50 mg total) by mouth daily. Pleas Koch, NP Taking Active   insulin degludec (TRESIBA) 200 UNIT/ML FlexTouch Pen [id number] Yes Inject 80 Units into the skin daily. [provider] Taking Active Self           Med Note Georgiann Cocker, AMY E   Fri Oct 14, 2021  1:22 PM) 70 U QHS  levocetirizine (XYZAL) 5 MG tablet [id number] Yes TAKE 1 TABLET BY MOUTH ONCE EVERY EVENING FOR ALLERGIES Pleas Koch, NP Taking Active   levothyroxine (SYNTHROID) 125 MCG tablet [id number] Yes Take 1 tablet by mouth every morning on an empty stomach with water only.  No food or other medications for 30 minutes. Pleas Koch, NP Taking Active   metFORMIN (GLUCOPHAGE) 1000 MG tablet [id number] Yes TAKE 1 TABLET BY MOUTH TWICE A DAY Chauvin, Robert, PA Taking Active   NOVOLOG FLEXPEN 100 UNIT/ML FlexPen [id number] Yes Inject 28 units before breakfast, inject 24 units beforelunch, inject 34 units before supper. Increase when blood sugar is over 150. Use 2 units more for every 50 over. [provider] Taking Active   ramipril (ALTACE) 10 MG capsule [id number] Yes TAKE 1 CAPSULE BY MOUTH 2 TIMES DAILY Pleas Koch, NP Taking Active   rosuvastatin (CRESTOR) 10 MG tablet [id number] Yes TAKE 1 TABLET BY MOUTH ONCE A DAY FOR CHOLESTEROL. Pleas Koch, NP Taking Active   Spacer/Aero-Holding Chambers (AEROCHAMBER PLUS FLO-VU MEDIUM) MISC [id number] Yes Use daily as needed with inhaler Pleas Koch, NP Taking Active   TRUEPLUS 5-BEVEL PEN NEEDLES 31G X 6 MM MISC [id number] Yes  [provider] Taking Active   TRULICITY 1.5 TD/4.2AJ SOPN [id number] Yes Inject 1.5 mg as directed once a week. [provider] Taking Active             Patient Active Problem List   Diagnosis Date Noted   CAD (coronary artery disease) 05/02/2022   COVID-19 02/14/2022   Acute maxillary sinusitis 12/05/2020   Aortic atherosclerosis ([place]) 06/16/2020   [place] 01/08/2020   Welcome to  Medicare preventive visit 09/25/2017   Personal history of tobacco use, presenting hazards to health 06/30/2016   Preventative health care  06/05/2016   Osteoarthritis of both knees 11/09/2015   Diabetes mellitus ([place]) 06/14/2015   Allergic rhinitis 05/28/2015   Morbid obesity with BMI of 40.0-44.9, adult ([place]) 05/28/2015   Chronic constipation 05/28/2015   Chronic obstructive pulmonary disease ([place]) 05/28/2015   Essential (primary) hypertension 05/28/2015   Hypercholesterolemia 05/28/2015   Gastro-esophageal reflux disease without esophagitis 05/28/2015   Hypothyroidism 05/28/2015   Dermatofibroma of left lower leg 04/01/2015   Former smoker 03/04/2015   History of colonic polyps 08/20/2014   Idiopathic localized osteoarthropathy 03/16/2014    Immunization History  Administered Date(s) Administered   Fluad Quad(high Dose 65+) 04/15/2019, 04/26/2020, 04/27/2021, 05/02/2022   Influenza Split 04/26/2007   Influenza,inj,Quad PF,6+ Mos 03/18/2015, 05/10/2017, 04/08/2018   Influenza-Unspecified 03/18/2015, 04/16/2016, 05/10/2017   PFIZER Comirnaty(Gray Top)Covid-19 Tri-Sucrose Vaccine 09/16/2019, 10/07/2019   PFIZER(Purple Top)SARS-COV-2 Vaccination 10/07/2019, 07/19/2020   Pneumococcal Conjugate-13 09/25/2017   Pneumococcal Polysaccharide-23 06/19/2011, 06/05/2016   Tdap 06/19/2011, 04/01/2015   Zoster Recombinat (Shingrix) 04/15/2019, 06/17/2019    Conditions to be addressed/monitored:  Hypertension, Hyperlipidemia, and Diabetes  Care Plan : [place]  Updates made by Charlton Haws, [place] since 06/07/2022 12:00 AM     Problem: Hypertension, Hyperlipidemia, and Diabetes      Long-Range Goal: Disease Management   Start Date: 06/07/2021  Priority: High  Note:   Current Barriers:  None identified  Pharmacist Clinical Goal(s):  Patient will contact provider office for questions/concerns as evidenced notation of same in electronic health record through  collaboration with PharmD and provider.   Interventions: 1:1 collaboration with Pleas Koch, NP regarding development and update of comprehensive plan of care as evidenced by provider attestation and co-signature Inter-disciplinary care team collaboration (see longitudinal plan of care) Comprehensive medication review performed; medication list updated in electronic medical record  Hypertension (BP goal <140/90) -Controlled -  -Current home readings: 123/82 -Current treatment: Amlodipine 5 mg daily - Appropriate, Effective, Safe, Accessible HCTZ 50 mg daily -Appropriate, Effective, Safe, Accessible Ramipril 10 mg BID -Appropriate, Effective, Safe, Accessible -Medications previously tried: none   -Current exercise habits: pedal bike, 8 minutes twice daily  -Denies hypotensive/hypertensive symptoms -Educated on BP goals and benefits of medications for prevention of heart attack, stroke and kidney damage; -Counseled to monitor BP at home periodically -Recommended to continue current medication  Hyperlipidemia: (LDL goal < 70) -Controlled - LDL 30 (04/2022) at goal -Current treatment: Rosuvastatin 10 mg daily -Appropriate, Effective, Safe, Accessible Fenofibrate 145 mg daily -Appropriate, Effective, Safe, Accessible -Medications previously tried: none  -Educated on Cholesterol goals;  -Recommended to continue current medication  Diabetes (A1c goal <7%) - Not ideally controlled - A1c 7.4% (04/2022) -Follows with Endocrine (Dr Gabriel Carina) -Current home glucose readings - uses Freestyle Libre Range: 85 - 180 7-day avg: 137; 14-day avg 153; 30-day avg 141 Low sugars: occasionally < 55 Denies hypoglycemic/hyperglycemic symptoms -Current medications: Tresiba U200 70 units daily -Appropriate, Effective, Safe, Accessible Novolog - 28/24/34 Appropriate, Effective, Safe, Accessible Metformin 1000 mg BID -Appropriate, Effective, Safe, Accessible Trulicity 1.5 mg - Inject weekly  -Appropriate, Effective, Safe, Accessible Freestyle Libre 2 -Medications previously tried: glipizide, Onglyza (cost) -Educated on A1c and blood sugar goals; hypoglycemia prevention and treatment -Recommended to continue current medication  Patient Goals/Self-Care Activities Patient will:  - take medications as prescribed as evidenced by patient report and record review focus on medication adherence by routine check glucose using CGM, document, and provide at future appointments check blood pressure periodically, document, and provide at future appointments  Medication Assistance: None required.  Patient affirms current coverage meets needs.  Compliance/Adherence/Medication fill history: Care Gaps: None  Star-Rating Drugs: Rosuvastatin - PDC 91% Ramipril - PDC 79% Metformin - PDC 95%  Medication Access: Within the past 30 days, how often has patient missed a dose of medication? 0 Is a pillbox or other method used to improve adherence? Yes  Factors that may affect medication adherence? no barriers identified Are meds synced by current pharmacy? No  Are meds delivered by current pharmacy? No  Does patient experience delays in picking up medications due to transportation concerns? No   [facility] Services Reviewed: Is patient disadvantaged to use [facility]?: No  Current Rx insurance plan: HTA Name and location of Current pharmacy:  [place], [place], [address], [place] [address] Phone: [phone] Fax: [phone]  [place], [place] [address] [place] [address] Phone: [phone] Fax: [phone]  [facility] services reviewed with patient today?: No  Patient requests to transfer care to [facility] Pharmacy?: No  Reason patient declined to change pharmacies: Loyalty to other pharmacy/Patient preference   Care Plan and Follow Up  Patient Decision:  Patient agrees to Care Plan and Follow-up.  Plan: The patient has been provided with contact information for the care management team and has been advised to call with any health related questions or concerns.   [name], PharmD, BCACP Clinical Pharmacist [facility] at [facility] [phone]

## 2022-06-05 LAB — HM DIABETES EYE EXAM

## 2022-06-07 ENCOUNTER — Encounter: Payer: Self-pay | Admitting: Pharmacist

## 2022-06-07 ENCOUNTER — Encounter: Payer: Self-pay | Admitting: Primary Care

## 2022-06-07 NOTE — Patient Instructions (Signed)
Visit Information  Phone number for Pharmacist: [phone]   Goals Addressed   None     Care Plan : [place]  Updates made by Charlton Haws, [place] since 06/07/2022 12:00 AM     Problem: Hypertension, Hyperlipidemia, and Diabetes      Long-Range Goal: Disease Management   Start Date: 06/07/2021  Priority: High  Note:   Current Barriers:  None identified  Pharmacist Clinical Goal(s):  Patient will contact provider office for questions/concerns as evidenced notation of same in electronic health record through collaboration with PharmD and provider.   Interventions: 1:1 collaboration with Pleas Koch, NP regarding development and update of comprehensive plan of care as evidenced by provider attestation and co-signature Inter-disciplinary care team collaboration (see longitudinal plan of care) Comprehensive medication review performed; medication list updated in electronic medical record  Hypertension (BP goal <140/90) -Controlled -  -Current home readings: 123/82 -Current treatment: Amlodipine 5 mg daily - Appropriate, Effective, Safe, Accessible HCTZ 50 mg daily -Appropriate, Effective, Safe, Accessible Ramipril 10 mg BID -Appropriate, Effective, Safe, Accessible -Medications previously tried: none   -Current exercise habits: pedal bike, 8 minutes twice daily  -Denies hypotensive/hypertensive symptoms -Educated on BP goals and benefits of medications for prevention of heart attack, stroke and kidney damage; -Counseled to monitor BP at home periodically -Recommended to continue current medication  Hyperlipidemia: (LDL goal < 70) -Controlled - LDL 30 (04/2022) at goal -Current treatment: Rosuvastatin 10 mg daily -Appropriate, Effective, Safe, Accessible Fenofibrate 145 mg daily -Appropriate, Effective, Safe, Accessible -Medications previously tried: none  -Educated on Cholesterol goals;  -Recommended to continue current medication  Diabetes  (A1c goal <7%) - Not ideally controlled - A1c 7.4% (04/2022) -Follows with Endocrine (Dr Gabriel Carina) -Current home glucose readings - uses Freestyle Libre Range: 85 - 180 7-day avg: 137; 14-day avg 153; 30-day avg 141 Low sugars: occasionally < 55 Denies hypoglycemic/hyperglycemic symptoms -Current medications: Tresiba U200 70 units daily -Appropriate, Effective, Safe, Accessible Novolog - 28/24/34 Appropriate, Effective, Safe, Accessible Metformin 1000 mg BID -Appropriate, Effective, Safe, Accessible Trulicity 1.5 mg - Inject weekly -Appropriate, Effective, Safe, Accessible Freestyle Libre 2 -Medications previously tried: glipizide, Onglyza (cost) -Educated on A1c and blood sugar goals; hypoglycemia prevention and treatment -Recommended to continue current medication  Patient Goals/Self-Care Activities Patient will:  - take medications as prescribed as evidenced by patient report and record review focus on medication adherence by routine check glucose using CGM, document, and provide at future appointments check blood pressure periodically, document, and provide at future appointments       Patient verbalizes understanding of instructions and care plan provided today and agrees to view in [place]. Active MyChart status and patient understanding of how to access instructions and care plan via MyChart confirmed with patient.    Telephone follow up appointment with pharmacy team member scheduled for: PRN  [name], PharmD, BCACP Clinical Pharmacist [facility] at [facility] [phone]

## 2022-07-18 ENCOUNTER — Other Ambulatory Visit: Payer: Self-pay | Admitting: Primary Care

## 2022-07-18 DIAGNOSIS — J309 Allergic rhinitis, unspecified: Secondary | ICD-10-CM

## 2022-07-21 ENCOUNTER — Ambulatory Visit
Admission: RE | Admit: 2022-07-21 | Discharge: 2022-07-21 | Disposition: A | Discharge status: Home / Self Care | Payer: PPO | Source: Ambulatory Visit | Attending: Acute Care | Admitting: Acute Care

## 2022-07-21 DIAGNOSIS — Z87891 Personal history of nicotine dependence: Secondary | ICD-10-CM | POA: Diagnosis not present

## 2022-07-21 DIAGNOSIS — I2510 Atherosclerotic heart disease of native coronary artery without angina pectoris: Secondary | ICD-10-CM | POA: Insufficient documentation

## 2022-07-21 DIAGNOSIS — Z122 Encounter for screening for malignant neoplasm of respiratory organs: Secondary | ICD-10-CM | POA: Insufficient documentation

## 2022-07-21 DIAGNOSIS — J439 Emphysema, unspecified: Secondary | ICD-10-CM | POA: Insufficient documentation

## 2022-07-21 DIAGNOSIS — I700 Atherosclerosis of aorta: Secondary | ICD-10-CM | POA: Insufficient documentation

## 2022-07-25 ENCOUNTER — Other Ambulatory Visit: Payer: Self-pay | Admitting: Acute Care

## 2022-07-25 DIAGNOSIS — Z87891 Personal history of nicotine dependence: Secondary | ICD-10-CM

## 2022-07-25 DIAGNOSIS — Z122 Encounter for screening for malignant neoplasm of respiratory organs: Secondary | ICD-10-CM

## 2022-08-01 ENCOUNTER — Encounter: Payer: Self-pay | Admitting: Podiatry

## 2022-08-01 ENCOUNTER — Ambulatory Visit: Payer: HMO | Admitting: Podiatry

## 2022-08-01 VITALS — BP 152/82

## 2022-08-01 DIAGNOSIS — M79674 Pain in right toe(s): Secondary | ICD-10-CM | POA: Diagnosis not present

## 2022-08-01 DIAGNOSIS — E1142 Type 2 diabetes mellitus with diabetic polyneuropathy: Secondary | ICD-10-CM

## 2022-08-01 DIAGNOSIS — E119 Type 2 diabetes mellitus without complications: Secondary | ICD-10-CM

## 2022-08-01 DIAGNOSIS — M2042 Other hammer toe(s) (acquired), left foot: Secondary | ICD-10-CM

## 2022-08-01 DIAGNOSIS — L84 Corns and callosities: Secondary | ICD-10-CM

## 2022-08-01 DIAGNOSIS — B351 Tinea unguium: Secondary | ICD-10-CM | POA: Diagnosis not present

## 2022-08-01 DIAGNOSIS — M2041 Other hammer toe(s) (acquired), right foot: Secondary | ICD-10-CM | POA: Diagnosis not present

## 2022-08-01 DIAGNOSIS — M79675 Pain in left toe(s): Secondary | ICD-10-CM

## 2022-08-01 NOTE — Progress Notes (Signed)
ANNUAL DIABETIC FOOT EXAM  Subjective: [name] presents today for annual diabetic foot examination. His wife is present during today's visit. Chief Complaint  Patient presents with   Nail Problem    [facility] BS-98 A1C-7.3 PCP-[place], Belenda Cruise  PCP-04/2022   Patient confirms h/o diabetes.  Patient denies any h/o foot wounds.  Patient admits symptoms of foot numbness.  Risk factors: diabetes, HTN, CAD, COPD, hypercholesterolemia, h/o tobacco use in remission.  Pleas Koch, NP is patient's PCP.  Past Medical History:  Diagnosis Date   Adenomatous polyp    Aortic atherosclerosis ([place]) 06/16/2020   Arthritis    knee   Chronic obstructive lung disease ([place])    Colon polyp 2008   COPD with emphysema ([place])    Diabetes mellitus    Emphysema lung ([place]) 06/16/2020   Emphysema lung ([place])    Emphysema of lung ([place]) 2016   Hypercholesteremia    Hyperlipemia    Hypertension    Hypothyroidism    Observed sleep apnea    Oxygen deficiency 2016   Sleep apnea    Statin myopathy    Patient Active Problem List   Diagnosis Date Noted   CAD (coronary artery disease) 05/02/2022   COVID-19 02/14/2022   Acute maxillary sinusitis 12/05/2020   Aortic atherosclerosis ([place]) 06/16/2020   [place] 01/08/2020   Welcome to Medicare preventive visit 09/25/2017   Personal history of tobacco use, presenting hazards to health 06/30/2016   Preventative health care 06/05/2016   Osteoarthritis of both knees 11/09/2015   Diabetes mellitus ([place]) 06/14/2015   Allergic rhinitis 05/28/2015   Morbid obesity with BMI of 40.0-44.9, adult ([place]) 05/28/2015   Chronic constipation 05/28/2015   Chronic obstructive pulmonary disease ([place]) 05/28/2015   Essential (primary) hypertension 05/28/2015   Hypercholesterolemia 05/28/2015   Gastro-esophageal reflux disease without esophagitis 05/28/2015   Hypothyroidism 05/28/2015   Dermatofibroma of left lower leg 04/01/2015   Former smoker 03/04/2015    History of colonic polyps 08/20/2014   Idiopathic localized osteoarthropathy 03/16/2014   Past Surgical History:  Procedure Laterality Date   COLONOSCOPY  2008, 2016   Dr Bary Castilla   COLONOSCOPY WITH PROPOFOL N/A 10/03/2017   Procedure: COLONOSCOPY WITH PROPOFOL;  Surgeon: Robert Bellow, MD;  Location: [facility] ENDOSCOPY;  Service: Endoscopy;  Laterality: N/A;   COLONOSCOPY WITH PROPOFOL N/A 10/08/2020   Procedure: COLONOSCOPY WITH PROPOFOL;  Surgeon: Robert Bellow, MD;  Location: [facility] ENDOSCOPY;  Service: Endoscopy;  Laterality: N/A;   DERMATOFIBROMA  03/26/2015   Procedure: Excision mass left leg;  Surgeon: Robert Bellow, MD;  Location: [facility] ORS;  Service: General;  Laterality: N/A;   LIPOMA EXCISION N/A 03/26/2015       TONSILLECTOMY     Current Outpatient Medications on File Prior to Visit  Medication Sig Dispense Refill   albuterol (VENTOLIN HFA) 108 (90 Base) MCG/ACT inhaler INHALE 2 PUFFS INTO THE LUNGS EVERY 4 HOURS AS NEEDED FOR WHEEZING OR SHORTNESS OF BREATH 18 g 0   amLODipine (NORVASC) 5 MG tablet Take 1 tablet by mouth daily.     aspirin 81 MG EC tablet Take by mouth.     Continuous Blood Gluc Receiver (FREESTYLE LIBRE 2 READER) DEVI by Does not apply route.     Continuous Blood Gluc Sensor (FREESTYLE LIBRE 2 SENSOR) MISC by Does not apply route.     fenofibrate (TRICOR) 145 MG tablet Take 1 tablet by mouth daily.     fluticasone (FLONASE) 50 MCG/ACT nasal spray Place 1 spray into  both nostrils 2 (two) times daily. 16 g 0   hydrochlorothiazide (HYDRODIURIL) 50 MG tablet Take 1 tablet (50 mg total) by mouth daily. 90 tablet 1   insulin degludec (TRESIBA) 200 UNIT/ML FlexTouch Pen Inject 80 Units into the skin daily.     levocetirizine (XYZAL) 5 MG tablet TAKE 1 TABLET BY MOUTH ONCE EVERY EVENING FOR ALLERGIES 90 tablet 2   levothyroxine (SYNTHROID) 125 MCG tablet Take 1 tablet by mouth every morning on an empty stomach with water only.  No food or other medications  for 30 minutes. 90 tablet 0   metFORMIN (GLUCOPHAGE) 1000 MG tablet TAKE 1 TABLET BY MOUTH TWICE A DAY 180 tablet 1   NOVOLOG FLEXPEN 100 UNIT/ML FlexPen Inject 28 units before breakfast, inject 24 units beforelunch, inject 34 units before supper. Increase when blood sugar is over 150. Use 2 units more for every 50 over.  9   ramipril (ALTACE) 10 MG capsule TAKE 1 CAPSULE BY MOUTH 2 TIMES DAILY 180 capsule 1   rosuvastatin (CRESTOR) 10 MG tablet TAKE 1 TABLET BY MOUTH ONCE A DAY FOR CHOLESTEROL. 90 tablet 3   Spacer/Aero-Holding Chambers (AEROCHAMBER PLUS FLO-VU MEDIUM) MISC Use daily as needed with inhaler 1 each 0   TRUEPLUS 5-BEVEL PEN NEEDLES 31G X 6 MM MISC      TRULICITY 1.5 NI/7.7OE SOPN Inject 1.5 mg as directed once a week.     No current facility-administered medications on file prior to visit.    Allergies  Allergen Reactions   Atorvastatin     Other reaction(s): Headache   Other Other (See Comments)    STATIN DRUG-CAUSED MUSCLE PAIN/LETHARGIC   Social History   Occupational History   Occupation: retired  Tobacco Use   Smoking status: Former    Packs/day: 2.00    Years: 35.00    Total pack years: 70.00    Types: Cigarettes, E-cigarettes    Quit date: 12/11/2014    Years since quitting: 7.6   Smokeless tobacco: Never   Tobacco comments:    I don't smoke anymore.  Vaping Use   Vaping Use: Never used  Substance and Sexual Activity   Alcohol use: No   Drug use: No   Sexual activity: Not Currently    Birth control/protection: None   Family History  Problem Relation Age of Onset   Stroke Mother    Diabetes Mother    Early death Mother    Stroke Father    Arthritis Father    Hypertension Father    Diabetes Other    Other Daughter 32       precancerous colon polyp    Diabetes Maternal Grandmother    Diabetes Paternal Grandmother    Immunization History  Administered Date(s) Administered   Fluad Quad(high Dose 65+) 04/15/2019, 04/26/2020, 04/27/2021,  05/02/2022   Influenza Split 04/26/2007   Influenza,inj,Quad PF,6+ Mos 03/18/2015, 05/10/2017, 04/08/2018   Influenza-Unspecified 03/18/2015, 04/16/2016, 05/10/2017   PFIZER Comirnaty(Gray Top)Covid-19 Tri-Sucrose Vaccine 09/16/2019, 10/07/2019   PFIZER(Purple Top)SARS-COV-2 Vaccination 10/07/2019, 07/19/2020   Pneumococcal Conjugate-13 09/25/2017   Pneumococcal Polysaccharide-23 06/19/2011, 06/05/2016   Tdap 06/19/2011, 04/01/2015   Zoster Recombinat (Shingrix) 04/15/2019, 06/17/2019     Review of Systems: Negative except as noted in the HPI.   Objective: Vitals:   08/01/22 1014  BP: (!) 152/82   LLIAM HOH is a pleasant 72 y.o. male in NAD. AAO X 3.  Vascular Examination: CFT <3 seconds b/l. DP/PT pulses faintly palpable b/l. Skin temperature gradient warm to  warm b/l. No pain with calf compression. No ischemia or gangrene. No cyanosis or clubbing noted b/l. Pedal hair absent. Trace edema noted BLE.   Neurological Examination: Sensation grossly intact b/l with 10 gram monofilament. Vibratory sensation intact b/l. Pt has subjective symptoms of neuropathy.  Dermatological Examination: Pedal skin warm and supple b/l. Toenails 1-5 b/l thick, discolored, elongated with subungual debris and pain on dorsal palpation.  No open wounds b/l LE. No interdigital macerations noted b/l LE. Hyperkeratotic lesion(s) submet head 2 right foot and submet head 5 right foot.  No erythema, no edema, no drainage, no fluctuance.  Musculoskeletal Examination: Muscle strength 5/5 to b/l LE. Hammertoe deformity noted 1-5 b/l.  Radiographs: None  Last A1c:      Latest Ref Rng & Units 05/02/2022   11:50 AM 01/02/2022   12:00 AM  Hemoglobin A1C  Hemoglobin-A1c 4.6 - 6.5 % 7.4  7.2         This result is from an external source.   Footwear Assessment: Does the patient wear appropriate shoes? Yes. Does the patient need inserts/orthotics? Yes.  ADA Risk Categorization: Low Risk :  Patient has  all of the following: Intact protective sensation No prior foot ulcer  No severe deformity Pedal pulses present  Assessment: 1. Pain due to onychomycosis of toenails of both feet   2. Callus   3. Acquired hammertoes of both feet   4. Diabetic peripheral neuropathy associated with type 2 diabetes mellitus ([place])   5. Encounter for diabetic foot exam ([place])     Plan: No orders of the defined types were placed in this encounter.  -Consent given for treatment as described below: -Diabetic foot examination performed today. -Continue foot and shoe inspections daily. Monitor blood glucose per PCP/Endocrinologist's recommendations. -Continue supportive shoe gear daily. -Mycotic toenails 1-5 bilaterally were debrided in length and girth with sterile nail nippers and dremel without incident. -Callus(es) submet head 2 right foot and submet head 5 right foot pared utilizing sterile scalpel blade without complication or incident. Total number debrided =2. -Patient/POA to call should there be question/concern in the interim. Return in about 3 months (around 10/31/2022).  Marzetta Board, DPM

## 2022-09-11 DIAGNOSIS — E1159 Type 2 diabetes mellitus with other circulatory complications: Secondary | ICD-10-CM | POA: Diagnosis not present

## 2022-09-18 DIAGNOSIS — E669 Obesity, unspecified: Secondary | ICD-10-CM | POA: Diagnosis not present

## 2022-09-18 DIAGNOSIS — R600 Localized edema: Secondary | ICD-10-CM | POA: Diagnosis not present

## 2022-09-18 DIAGNOSIS — E1169 Type 2 diabetes mellitus with other specified complication: Secondary | ICD-10-CM | POA: Diagnosis not present

## 2022-09-18 DIAGNOSIS — I10 Essential (primary) hypertension: Secondary | ICD-10-CM | POA: Diagnosis not present

## 2022-09-18 DIAGNOSIS — E1142 Type 2 diabetes mellitus with diabetic polyneuropathy: Secondary | ICD-10-CM | POA: Diagnosis not present

## 2022-09-18 DIAGNOSIS — E1159 Type 2 diabetes mellitus with other circulatory complications: Secondary | ICD-10-CM | POA: Diagnosis not present

## 2022-09-18 DIAGNOSIS — E039 Hypothyroidism, unspecified: Secondary | ICD-10-CM | POA: Diagnosis not present

## 2022-10-16 ENCOUNTER — Ambulatory Visit (INDEPENDENT_AMBULATORY_CARE_PROVIDER_SITE_OTHER): Payer: PPO

## 2022-10-16 VITALS — Ht 70.0 in | Wt 315.0 lb

## 2022-10-16 DIAGNOSIS — Z0000 Encounter for general adult medical examination without abnormal findings: Secondary | ICD-10-CM | POA: Diagnosis not present

## 2022-10-16 NOTE — Progress Notes (Signed)
I connected with  [name] on 10/16/22 by a audio enabled telemedicine application and verified that I am speaking with the correct person using two identifiers.  Patient Location: Home  Provider Location: Home Office  I discussed the limitations of evaluation and management by telemedicine. The patient expressed understanding and agreed to proceed.  Subjective:   [name] is a 72 y.o. male who presents for Medicare Annual/Subsequent preventive examination.  Review of Systems      Cardiac Risk Factors include: advanced age (>33men, >4 women)     Objective:    Today's Vitals   10/16/22 0855 10/16/22 0856  Weight: (!) 315 lb (142.9 kg)   Height: 5\' 10"  (1.778 m)   PainSc:  5    Body mass index is 45.2 kg/m.     10/16/2022    9:08 AM 10/14/2021    1:32 PM 10/08/2020    7:41 AM 10/23/2019    3:23 PM 10/03/2017    7:46 AM 08/27/2015    9:51 AM 06/14/2015    8:09 AM  Advanced Directives  Does Patient Have a Medical Advance Directive? No No No No No No No  Would patient like information on creating a medical advance directive? No - Patient declined No - Patient declined  Yes (MAU/Ambulatory/Procedural Areas - Information given)       Current Medications (verified) Outpatient Encounter Medications as of 10/16/2022  Medication Sig   albuterol (VENTOLIN HFA) 108 (90 Base) MCG/ACT inhaler INHALE 2 PUFFS INTO THE LUNGS EVERY 4 HOURS AS NEEDED FOR WHEEZING OR SHORTNESS OF BREATH   amLODipine (NORVASC) 5 MG tablet Take 1 tablet by mouth daily.   aspirin 81 MG EC tablet Take by mouth.   Continuous Blood Gluc Receiver (FREESTYLE LIBRE 2 READER) DEVI by Does not apply route.   Continuous Blood Gluc Sensor (FREESTYLE LIBRE 2 SENSOR) MISC by Does not apply route.   fenofibrate (TRICOR) 145 MG tablet Take 1 tablet by mouth daily.   fluticasone (FLONASE) 50 MCG/ACT nasal spray Place 1 spray into both nostrils 2 (two) times daily.   hydrochlorothiazide (HYDRODIURIL) 50 MG tablet Take 1  tablet (50 mg total) by mouth daily.   insulin degludec (TRESIBA) 200 UNIT/ML FlexTouch Pen Inject 80 Units into the skin daily.   levocetirizine (XYZAL) 5 MG tablet TAKE 1 TABLET BY MOUTH ONCE EVERY EVENING FOR ALLERGIES   levothyroxine (SYNTHROID) 125 MCG tablet Take 1 tablet by mouth every morning on an empty stomach with water only.  No food or other medications for 30 minutes.   metFORMIN (GLUCOPHAGE) 1000 MG tablet TAKE 1 TABLET BY MOUTH TWICE A DAY   NOVOLOG FLEXPEN 100 UNIT/ML FlexPen Inject 28 units before breakfast, inject 24 units beforelunch, inject 34 units before supper. Increase when blood sugar is over 150. Use 2 units more for every 50 over.   ramipril (ALTACE) 10 MG capsule TAKE 1 CAPSULE BY MOUTH 2 TIMES DAILY   rosuvastatin (CRESTOR) 10 MG tablet TAKE 1 TABLET BY MOUTH ONCE A DAY FOR CHOLESTEROL.   Spacer/Aero-Holding Chambers (AEROCHAMBER PLUS FLO-VU MEDIUM) MISC Use daily as needed with inhaler   TRUEPLUS 5-BEVEL PEN NEEDLES 31G X 6 MM MISC    TRULICITY 1.5 0000000 SOPN Inject 1.5 mg as directed once a week.   No facility-administered encounter medications on file as of 10/16/2022.    Allergies (verified) Atorvastatin and Other   History: Past Medical History:  Diagnosis Date   Adenomatous polyp    Aortic atherosclerosis 06/16/2020  Arthritis    knee   Chronic obstructive lung disease    Colon polyp 2008   COPD with emphysema    Diabetes mellitus    Emphysema lung 06/16/2020   Emphysema lung    Emphysema of lung 2016   Hypercholesteremia    Hyperlipemia    Hypertension    Hypothyroidism    Observed sleep apnea    Oxygen deficiency 2016   Sleep apnea    Statin myopathy    Past Surgical History:  Procedure Laterality Date   COLONOSCOPY  2008, 2016   Dr Bary Castilla   COLONOSCOPY WITH PROPOFOL N/A 10/03/2017   Procedure: COLONOSCOPY WITH PROPOFOL;  Surgeon: Robert Bellow, MD;  Location: [facility];  Service: Endoscopy;  Laterality: N/A;    COLONOSCOPY WITH PROPOFOL N/A 10/08/2020   Procedure: COLONOSCOPY WITH PROPOFOL;  Surgeon: Robert Bellow, MD;  Location: [facility] END[facility]COPY;  Service: Endoscopy;  Laterality: N/A;   DERMATOFIBROMA  03/26/2015   Procedure: Excision mass left leg;  Surgeon: Robert Bellow, MD;  Location: [facility] ORS;  Service: General;  Laterality: N/A;   LIPOMA EXCISION N/A 03/26/2015       TONSILLECTOMY     Family History  Problem Relation Age of Onset   Stroke Mother    Diabetes Mother    Early death Mother    Stroke Father    Arthritis Father    Hypertension Father    Diabetes Other    Other Daughter 27       precancerous colon polyp    Diabetes Maternal Grandmother    Diabetes Paternal Grandmother    Social History   Socioeconomic History   Marital status: Married    Spouse name: [name]   Number of children: 3   Years of education: Not on file   Highest education level: Not on file  Occupational History   Occupation: retired  Tobacco Use   Smoking status: Former    Packs/day: 2.00    Years: 35.00    Additional pack years: 0.00    Total pack years: 70.00    Types: Cigarettes, E-cigarettes    Quit date: 12/11/2014    Years since quitting: 7.8   Smokeless tobacco: Never   Tobacco comments:    I don't smoke anymore.  Vaping Use   Vaping Use: Never used  Substance and Sexual Activity   Alcohol use: No   Drug use: No   Sexual activity: Not Currently    Birth control/protection: None  Other Topics Concern   Not on file  Social History Narrative   Married.   3 children. 2 grandchildren.   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    One son lives next door, another stays with them sometimes   Social Determinants of Health   Financial Resource Strain: Low Risk  (10/16/2022)   Overall Financial Resource Strain (CARDIA)    Difficulty of Paying Living Expenses: Not hard at all  Food Insecurity: No Food Insecurity (10/16/2022)    Hunger Vital Sign    Worried About Running Out of Food in the Last Year: Never true    [place] in the Last Year: Never true  Transportation Needs: No Transportation Needs (10/16/2022)   PRAPARE - Hydrologist (Medical): No    Lack of Transportation (Non-Medical): No  Physical Activity: Insufficiently Active (10/16/2022)   Exercise Vital Sign    Days of Exercise  per Week: 4 days    Minutes of Exercise per Session: 30 min  Stress: No Stress Concern Present (10/16/2022)   [place]    Feeling of Stress : Not at all  Social Connections: Moderately Integrated (10/16/2022)   Social Connection and Isolation Panel [NHANES]    Frequency of Communication with Friends and Family: More than three times a week    Frequency of Social Gatherings with Friends and Family: More than three times a week    Attends Religious Services: More than 4 times per year    Active Member of [employer] or Organizations: No    Attends Archivist Meetings: Never    Marital Status: Married    Tobacco Counseling Counseling given: Not Answered Tobacco comments: I don't smoke anymore.   Clinical Intake:  Pre-visit preparation completed: Yes  Pain : 0-10 Pain Score: 5  Pain Type: Chronic pain Pain Location: Knee Pain Orientation: Right, Left Pain Descriptors / Indicators: Aching, Sharp, Throbbing Pain Onset: More than a month ago Pain Frequency: Intermittent     Nutritional Risks: None Diabetes: Yes CBG done?: No Did pt. bring in CBG monitor from home?: No  How often do you need to have someone help you when you read instructions, pamphlets, or other written materials from your doctor or pharmacy?: 1 - Never  Diabetic?Nutrition Risk Assessment:  Has the patient had any N/V/D within the last 2 months?  No  Does the patient have any non-healing wounds?  No  Has the patient had any unintentional weight  loss or weight gain?  No   Diabetes:  Is the patient diabetic?  Yes  If diabetic, was a CBG obtained today?  Yes  Did the patient bring in their glucometer from home?  No  How often do you monitor your CBG's? Wears Continuous monitor.   Financial Strains and Diabetes Management:  Are you having any financial strains with the device, your supplies or your medication? No .  Does the patient want to be seen by Chronic Care Management for management of their diabetes?  No  Would the patient like to be referred to a Nutritionist or for Diabetic Management?  No   Diabetic Exams:  Diabetic Eye Exam: Completed 06/05/22 Diabetic Foot Exam: Completed 07/01/21 [facility]    Interpreter Needed?: No  Information entered by :: C.Mallika Sanmiguel LPN   Activities of Daily Living    10/16/2022    9:09 AM 10/12/2022    7:32 PM  In your present state of health, do you have any difficulty performing the following activities:  Hearing? 0 0  Vision? 0 0  Difficulty concentrating or making decisions? 0 0  Walking or climbing stairs? 1 1  Comment Knee pain   Dressing or bathing? 0 0  Doing errands, shopping? 0 0  Preparing Food and eating ? N N  Using the Toilet? N N  In the past six months, have you accidently leaked urine? Y Y  Comment Occasional   Do you have problems with loss of bowel control? N Y  Managing your Medications? N N  Managing your Finances? N N  Housekeeping or managing your Housekeeping? N N    Patient Care Team: Pleas Koch, NP as PCP - General (Internal Medicine) Bary Castilla, Forest Gleason, MD (General Surgery) [name], Betsey Holiday, MD as Physician Assistant (Endocrinology) Marzetta Board, DPM as Consulting Physician (Podiatry) Little, Olevia Bowens (Inactive) (Orthotics) Coolin, Cleaster [name], [facility] as Pharmacist (  Pharmacist)  Indicate any recent Medical Services you may have received from other than [facility] providers in the past year (date may be approximate).      Assessment:   This is a routine wellness examination for Dolan.  Hearing/Vision screen Hearing Screening - Comments:: No aids Vision Screening - Comments:: Glasses - [facility]  Dietary issues and exercise activities discussed: Current Exercise Habits: Home exercise routine, Type of exercise: Other - see comments (Stationary bike), Time (Minutes): 30, Intensity: Mild, Exercise limited by: orthopedic condition(s) (knee pain)   Goals Addressed             This Visit's Progress    Patient Stated       Lose 75 pounds.       Depression Screen    10/16/2022    9:07 AM 10/14/2021    1:26 PM 10/26/2020   10:12 AM 10/23/2019   10:40 AM 01/30/2018    3:46 PM 04/05/2017    1:30 PM 12/18/2014    8:57 AM  PHQ 2/9 Scores  PHQ - 2 Score 0 0 0 0 0 0 0  PHQ- 9 Score   2        Fall Risk    10/16/2022    9:00 AM 10/12/2022    7:32 PM 11/07/2021    3:24 PM 10/14/2021    1:19 PM 10/26/2020   10:10 AM  Bruno in the past year? 0 0  0 1  Number falls in past yr: 0 0 1 0 0  Injury with Fall? 0 0 0 0 0  Risk for fall due to : No Fall Risks   Orthopedic patient;Impaired balance/gait   Follow up Falls prevention discussed;Falls evaluation completed   Falls prevention discussed;Education provided     FALL RISK PREVENTION PERTAINING TO THE HOME:  Any stairs in or around the home? Yes  If so, are there any without handrails? No  Home free of loose throw rugs in walkways, pet beds, electrical cords, etc? Yes  Adequate lighting in your home to reduce risk of falls? Yes   ASSISTIVE DEVICES UTILIZED TO PREVENT FALLS:  Life alert? No  Use of a cane, walker or w/c? Yes  Grab bars in the bathroom? Yes  Shower chair or bench in shower? Yes  Elevated toilet seat or a handicapped toilet? Yes    Cognitive Function:        10/16/2022    9:11 AM 10/14/2021    1:28 PM  6CIT Screen  What Year? 0 points 0 points  What month? 0 points 0 points  What time? 0 points 0 points  Count back  from 20 0 points 0 points  Months in reverse 0 points 0 points  Repeat phrase 0 points 2 points  Total Score 0 points 2 points    Immunizations Immunization History  Administered Date(s) Administered   Fluad Quad(high Dose 65+) 04/15/2019, 04/26/2020, 04/27/2021, 05/02/2022   Influenza Split 04/26/2007   Influenza,inj,Quad PF,6+ Mos 03/18/2015, 05/10/2017, 04/08/2018   Influenza-Unspecified 03/18/2015, 04/16/2016, 05/10/2017   PFIZER Comirnaty(Gray Top)Covid-19 Tri-Sucrose Vaccine 09/16/2019, 10/07/2019   PFIZER(Purple Top)SARS-COV-2 Vaccination 10/07/2019, 07/19/2020   Pneumococcal Conjugate-13 09/25/2017   Pneumococcal Polysaccharide-23 06/19/2011, 06/05/2016   Tdap 06/19/2011, 04/01/2015   Zoster Recombinat (Shingrix) 04/15/2019, 06/17/2019    TDAP status: Up to date  Flu Vaccine status: Up to date  Pneumococcal vaccine status: Due, Education has been provided regarding the importance of this vaccine. Advised may receive this vaccine at  local pharmacy or Health Dept. Aware to provide a copy of the vaccination record if obtained from local pharmacy or Health Dept. Verbalized acceptance and understanding.  Covid-19 vaccine status: Information provided on how to obtain vaccines.   Qualifies for Shingles Vaccine? Yes   Zostavax completed  unknown   Shingrix Completed?: Yes  Screening Tests Health Maintenance  Topic Date Due   COVID-19 Vaccine (5 - 2023-24 season) 03/17/2022   FOOT EXAM  07/01/2022   Pneumonia Vaccine 82+ Years old (6 of 3 - PPSV23 or PCV20) 05/03/2023 (Originally 06/05/2021)   HEMOGLOBIN A1C  11/01/2022   INFLUENZA VACCINE  02/15/2023   Diabetic kidney evaluation - eGFR measurement  05/03/2023   Diabetic kidney evaluation - Urine ACR  05/03/2023   OPHTHALMOLOGY EXAM  06/06/2023   Lung Cancer Screening  07/22/2023   COLONOSCOPY (Pts 45-41yrs Insurance coverage will need to be confirmed)  10/09/2023   Medicare Annual Wellness (AWV)  10/16/2023    DTaP/Tdap/Td (3 - Td or Tdap) 03/31/2025   Hepatitis C Screening  Completed   Zoster Vaccines- Shingrix  Completed   HPV VACCINES  Aged Out    Health Maintenance  Health Maintenance Due  Topic Date Due   COVID-19 Vaccine (5 - 2023-24 season) 03/17/2022   FOOT EXAM  07/01/2022    Colorectal cancer screening: Type of screening: Colonoscopy. Completed 10/08/20. Repeat every 3 years  Lung Cancer Screening: (Low Dose CT Chest recommended if Age 44-80 years, 30 pack-year currently smoking OR have quit w/in 15years.) does qualify.   Lung Cancer Screening Referral: 07/21/22   Additional Screening:  Hepatitis C Screening: does not qualify; Completed 04/15/19  Vision Screening: Recommended annual ophthalmology exams for early detection of glaucoma and other disorders of the eye. Is the patient up to date with their annual eye exam?  Yes  Who is the provider or what is the name of the office in which the patient attends annual eye exams? 06/05/22 [facility] If pt is not established with a provider, would they like to be referred to a provider to establish care? No .   Dental Screening: Recommended annual dental exams for proper oral hygiene  Community Resource Referral / Chronic Care Management: CRR required this visit?  No   CCM required this visit?  No      Plan:     I have personally reviewed and noted the following in the patient's chart:   Medical and social history Use of alcohol, tobacco or illicit drugs  Current medications and supplements including opioid prescriptions. Patient is not currently taking opioid prescriptions. Functional ability and status Nutritional status Physical activity Advanced directives List of other physicians Hospitalizations, surgeries, and ER visits in previous 12 months Vitals Screenings to include cognitive, depression, and falls Referrals and appointments  In addition, I have reviewed and discussed with patient certain preventive  protocols, quality metrics, and best practice recommendations. A written personalized care plan for preventive services as well as general preventive health recommendations were provided to patient.     Lebron Conners, LPN   [id number]   Nurse Notes: none

## 2022-10-16 NOTE — Patient Instructions (Signed)
[name] , Thank you for taking time to come for your Medicare Wellness Visit. I appreciate your ongoing commitment to your health goals. Please review the following plan we discussed and let me know if I can assist you in the future.   These are the goals we discussed:  Goals      Patient Stated     AWV plan 10/14/2021 - Stay as independent as possible, lose some weight and feel better     Patient Stated     Lose 75 pounds.     Pharmacy Care Plan     CARE PLAN ENTRY (see longitudinal plan of care for additional care plan information)  Current Barriers:  Chronic Disease Management support, education, and care coordination needs related to Hypertension, Hyperlipidemia, and Diabetes   Hypertension BP Readings from Last 3 Encounters:  02/16/20 (!) 157/67  01/08/20 124/70  11/12/19 140/70  Pharmacist Clinical Goal(s): Over the next 90 days, patient will work with PharmD and providers to maintain BP goal <130/80 Current regimen:  Amlodipine 5 mg - 1 tablet daily Hydrochlorothiazide 50 mg - 1 tablet daily Ramipril 10 mg - 1 tablet twice daily Interventions: Patient has a wrist cuff at home and checks daily.  Patient self care activities - Over the next 90 days, patient will: Check BP daily, document, and provide at future appointments Ensure daily salt intake < 2300 mg/day  Hyperlipidemia Lab Results  Component Value Date/Time   LDLCALC 97 06/01/2016 07:50 AM   LDLCALC 82 02/19/2015 09:23 AM  Pharmacist Clinical Goal(s): Over the next 90 days, patient will work with PharmD and providers to achieve LDL goal < 70 mg/dL  Current regimen:  Aspirin EC 81 mg - 1 tablet daily Fenofibrate 145 mg - 1 tablet daily Rosuvastatin 5 mg - 1 tablet daily Interventions: Reviewed tolerance of new statin, patient is doing well Patient self care activities - Over the next 90 days, patient will: Continue current medications as prescribed  Diabetes Lab Results  Component Value Date/Time    HGBA1C 7.5 (H) 06/01/2016 07:50 AM   HGBA1C 8.7 11/26/2015 08:55 AM   HGBA1C 8.0 08/27/2015 09:58 AM  Recent A1c per Dr. Gabriel Carina note - 7.3% Pharmacist Clinical Goal(s): Over the next 90 days, patient will work with PharmD and providers to maintain A1c goal 7.5% Current regimen:  Tresiba 200 unit/ml 70 units into the skin daily Metformin 1000 mg twice daily Novolog 100 unit/ml 28 units before breakfast/24 units before lunch/34 units before supper. Increase when blood sugar is over 150. Use 2 units more for every 50 over.  Freestyle Libre CGM Trueplus pen needles Interventions: Endocrinology's goal for a1c is 7.5% due to history of low blood sugar.  Reviewed chart - patient was changed to freestyle libre 2  Coordinate follow up to ensure patient new monitor is working well  Patient self care activities - Over the next 90 days, patient will: Check blood sugar 3-4 times daily, document, and provide at future appointments Contact provider with any episodes of hypoglycemia  Medication management Pharmacist Clinical Goal(s): Over the next 90 days, patient will work with PharmD and providers to maintain optimal medication adherence Current pharmacy: [facility] Interventions Comprehensive medication review performed. Continue current medication management strategy Patient self care activities - Over the next 90 days, patient will: Focus on medication adherence by continuing to use pill box Take medications as prescribed Report any questions or concerns to PharmD and/or provider(s)  Please see past updates related to this goal  by clicking on the "Past Updates" button in the selected goal          This is a list of the screening recommended for you and due dates:  Health Maintenance  Topic Date Due   COVID-19 Vaccine (5 - 2023-24 season) 03/17/2022   Complete foot exam   07/01/2022   Pneumonia Vaccine (3 of 3 - PPSV23 or PCV20) 05/03/2023*   Hemoglobin A1C  11/01/2022   Flu  Shot  02/15/2023   Yearly kidney function blood test for diabetes  05/03/2023   Yearly kidney health urinalysis for diabetes  05/03/2023   Eye exam for diabetics  06/06/2023   Screening for Lung Cancer  07/22/2023   Colon Cancer Screening  10/09/2023   Medicare Annual Wellness Visit  10/16/2023   DTaP/Tdap/Td vaccine (3 - Td or Tdap) 03/31/2025   Hepatitis C Screening: USPSTF Recommendation to screen - Ages 41-79 yo.  Completed   Zoster (Shingles) Vaccine  Completed   HPV Vaccine  Aged Out  *Topic was postponed. The date shown is not the original due date.    Advanced directives: Advance directive discussed with you today. Even though you declined this today, please call our office should you change your mind, and we can give you the proper paperwork for you to fill out.   Conditions/risks identified: none  Next appointment: Follow up in one year for your annual wellness visit. 10/17/23 @ 9:15 telephone visit   Preventive Care 65 Years and Older, Male  Preventive care refers to lifestyle choices and visits with your health care provider that can promote health and wellness. What does preventive care include? A yearly physical exam. This is also called an annual well check. Dental exams once or twice a year. Routine eye exams. Ask your health care provider how often you should have your eyes checked. Personal lifestyle choices, including: Daily care of your teeth and gums. Regular physical activity. Eating a healthy diet. Avoiding tobacco and drug use. Limiting alcohol use. Practicing safe sex. Taking low doses of aspirin every day. Taking vitamin and mineral supplements as recommended by your health care provider. What happens during an annual well check? The services and screenings done by your health care provider during your annual well check will depend on your age, overall health, lifestyle risk factors, and family history of disease. Counseling  Your health care provider  may ask you questions about your: Alcohol use. Tobacco use. Drug use. Emotional well-being. Home and relationship well-being. Sexual activity. Eating habits. History of falls. Memory and ability to understand (cognition). Work and work Statistician. Screening  You may have the following tests or measurements: Height, weight, and BMI. Blood pressure. Lipid and cholesterol levels. These may be checked every 5 years, or more frequently if you are over 6 years old. Skin check. Lung cancer screening. You may have this screening every year starting at age 33 if you have a 30-pack-year history of smoking and currently smoke or have quit within the past 15 years. Fecal occult blood test (FOBT) of the stool. You may have this test every year starting at age 72. Flexible sigmoidoscopy or colonoscopy. You may have a sigmoidoscopy every 5 years or a colonoscopy every 10 years starting at age 21. Prostate cancer screening. Recommendations will vary depending on your family history and other risks. Hepatitis C blood test. Hepatitis B blood test. Sexually transmitted disease (STD) testing. Diabetes screening. This is done by checking your blood sugar (glucose) after you have not eaten  for a while (fasting). You may have this done every 1-3 years. Abdominal aortic aneurysm (AAA) screening. You may need this if you are a current or former smoker. Osteoporosis. You may be screened starting at age 53 if you are at high risk. Talk with your health care provider about your test results, treatment options, and if necessary, the need for more tests. Vaccines  Your health care provider may recommend certain vaccines, such as: Influenza vaccine. This is recommended every year. Tetanus, diphtheria, and acellular pertussis (Tdap, Td) vaccine. You may need a Td booster every 10 years. Zoster vaccine. You may need this after age 62. Pneumococcal 13-valent conjugate (PCV13) vaccine. One dose is recommended after  age 83. Pneumococcal polysaccharide (PPSV23) vaccine. One dose is recommended after age 59. Talk to your health care provider about which screenings and vaccines you need and how often you need them. This information is not intended to replace advice given to you by your health care provider. Make sure you discuss any questions you have with your health care provider. Document Released: 07/30/2015 Document Revised: 03/22/2016 Document Reviewed: 05/04/2015 Elsevier Interactive Patient Education  2017 [place] Prevention in the Home Falls can cause injuries. They can happen to people of all ages. There are many things you can do to make your home safe and to help prevent falls. What can I do on the outside of my home? Regularly fix the edges of walkways and driveways and fix any cracks. Remove anything that might make you trip as you walk through a door, such as a raised step or threshold. Trim any bushes or trees on the path to your home. Use bright outdoor lighting. Clear any walking paths of anything that might make someone trip, such as rocks or tools. Regularly check to see if handrails are loose or broken. Make sure that both sides of any steps have handrails. Any raised decks and porches should have guardrails on the edges. Have any leaves, snow, or ice cleared regularly. Use sand or salt on walking paths during winter. Clean up any spills in your garage right away. This includes oil or grease spills. What can I do in the bathroom? Use night lights. Install grab bars by the toilet and in the tub and shower. Do not use towel bars as grab bars. Use non-skid mats or decals in the tub or shower. If you need to sit down in the shower, use a plastic, non-slip stool. Keep the floor dry. Clean up any water that spills on the floor as soon as it happens. Remove soap buildup in the tub or shower regularly. Attach bath mats securely with double-sided non-slip rug tape. Do not have  throw rugs and other things on the floor that can make you trip. What can I do in the bedroom? Use night lights. Make sure that you have a light by your bed that is easy to reach. Do not use any sheets or blankets that are too big for your bed. They should not hang down onto the floor. Have a firm chair that has side arms. You can use this for support while you get dressed. Do not have throw rugs and other things on the floor that can make you trip. What can I do in the kitchen? Clean up any spills right away. Avoid walking on wet floors. Keep items that you use a lot in easy-to-reach places. If you need to reach something above you, use a strong step stool that has  a grab bar. Keep electrical cords out of the way. Do not use floor polish or wax that makes floors slippery. If you must use wax, use non-skid floor wax. Do not have throw rugs and other things on the floor that can make you trip. What can I do with my stairs? Do not leave any items on the stairs. Make sure that there are handrails on both sides of the stairs and use them. Fix handrails that are broken or loose. Make sure that handrails are as long as the stairways. Check any carpeting to make sure that it is firmly attached to the stairs. Fix any carpet that is loose or worn. Avoid having throw rugs at the top or bottom of the stairs. If you do have throw rugs, attach them to the floor with carpet tape. Make sure that you have a light switch at the top of the stairs and the bottom of the stairs. If you do not have them, ask someone to add them for you. What else can I do to help prevent falls? Wear shoes that: Do not have high heels. Have rubber bottoms. Are comfortable and fit you well. Are closed at the toe. Do not wear sandals. If you use a stepladder: Make sure that it is fully opened. Do not climb a closed stepladder. Make sure that both sides of the stepladder are locked into place. Ask someone to hold it for you, if  possible. Clearly mark and make sure that you can see: Any grab bars or handrails. First and last steps. Where the edge of each step is. Use tools that help you move around (mobility aids) if they are needed. These include: Canes. Walkers. Scooters. Crutches. Turn on the lights when you go into a dark area. Replace any light bulbs as soon as they burn out. Set up your furniture so you have a clear path. Avoid moving your furniture around. If any of your floors are uneven, fix them. If there are any pets around you, be aware of where they are. Review your medicines with your doctor. Some medicines can make you feel dizzy. This can increase your chance of falling. Ask your doctor what other things that you can do to help prevent falls. This information is not intended to replace advice given to you by your health care provider. Make sure you discuss any questions you have with your health care provider. Document Released: 04/29/2009 Document Revised: 12/09/2015 Document Reviewed: 08/07/2014 Elsevier Interactive Patient Education  2017 [employer].

## 2022-11-08 ENCOUNTER — Ambulatory Visit: Payer: HMO | Admitting: Podiatry

## 2022-11-20 DIAGNOSIS — L851 Acquired keratosis [keratoderma] palmaris et plantaris: Secondary | ICD-10-CM | POA: Diagnosis not present

## 2022-11-20 DIAGNOSIS — L600 Ingrowing nail: Secondary | ICD-10-CM | POA: Diagnosis not present

## 2022-11-20 DIAGNOSIS — E1140 Type 2 diabetes mellitus with diabetic neuropathy, unspecified: Secondary | ICD-10-CM | POA: Diagnosis not present

## 2022-11-20 DIAGNOSIS — Z794 Long term (current) use of insulin: Secondary | ICD-10-CM | POA: Diagnosis not present

## 2022-11-20 DIAGNOSIS — B351 Tinea unguium: Secondary | ICD-10-CM | POA: Diagnosis not present

## 2022-11-23 DIAGNOSIS — G4733 Obstructive sleep apnea (adult) (pediatric): Secondary | ICD-10-CM | POA: Diagnosis not present

## 2022-11-23 DIAGNOSIS — J449 Chronic obstructive pulmonary disease, unspecified: Secondary | ICD-10-CM | POA: Diagnosis not present

## 2022-11-28 ENCOUNTER — Telehealth: Payer: Self-pay

## 2022-11-28 NOTE — Progress Notes (Signed)
Care Management & Coordination Services Pharmacy Team  Reason for Encounter: General adherence update   Contacted patient for general health update and medication adherence call.  {US HC Outreach:28874}   What concerns do you have about your medications?  The patient {denies/reports:25180} side effects with their medications.   How often do you forget or accidentally miss a dose? {missed doses:25554}  Do you use a pillbox? {yes/no:20286}  Are you having any problems getting your medications from your pharmacy? {yes/no:20286}  Has the cost of your medications been a concern? {yes/no:20286} If yes, what medication and is patient assistance available or has it been applied for?  Since last visit with PharmD, {no/thefollowing:25210} interventions have been made.   The patient has not had an ED visit since last contact.   The patient {denies/reports:25180} problems with their health.   Patient {denies/reports:25180} concerns or questions for ***, PharmD at this time.   Counseled patient on: {GENERALCOUNSELING:28686}   Chart Updates:  Recent office visits:  ***  Recent consult visits:  ***  Hospital visits:  {Hospital DC Yes/No:21091515}  Medications: Outpatient Encounter Medications as of 11/28/2022  Medication Sig Note   albuterol (VENTOLIN HFA) 108 (90 Base) MCG/ACT inhaler INHALE 2 PUFFS INTO THE LUNGS EVERY 4 HOURS AS NEEDED FOR WHEEZING OR SHORTNESS OF BREATH    amLODipine (NORVASC) 5 MG tablet Take 1 tablet by mouth daily.    aspirin 81 MG EC tablet Take by mouth.    Continuous Blood Gluc Receiver (FREESTYLE LIBRE 2 READER) DEVI by Does not apply route.    Continuous Blood Gluc Sensor (FREESTYLE LIBRE 2 SENSOR) MISC by Does not apply route.    fenofibrate (TRICOR) 145 MG tablet Take 1 tablet by mouth daily.    fluticasone (FLONASE) 50 MCG/ACT nasal spray Place 1 spray into both nostrils 2 (two) times daily.    hydrochlorothiazide (HYDRODIURIL) 50 MG tablet Take 1  tablet (50 mg total) by mouth daily.    insulin degludec (TRESIBA) 200 UNIT/ML FlexTouch Pen Inject 80 Units into the skin daily. 10/14/2021: 70 U QHS   levocetirizine (XYZAL) 5 MG tablet TAKE 1 TABLET BY MOUTH ONCE EVERY EVENING FOR ALLERGIES    levothyroxine (SYNTHROID) 125 MCG tablet Take 1 tablet by mouth every morning on an empty stomach with water only.  No food or other medications for 30 minutes.    metFORMIN (GLUCOPHAGE) 1000 MG tablet TAKE 1 TABLET BY MOUTH TWICE A DAY    NOVOLOG FLEXPEN 100 UNIT/ML FlexPen Inject 28 units before breakfast, inject 24 units beforelunch, inject 34 units before supper. Increase when blood sugar is over 150. Use 2 units more for every 50 over. 10/16/2022: 34 at breakfast, 24 at lunch, 34 at bedtime   ramipril (ALTACE) 10 MG capsule TAKE 1 CAPSULE BY MOUTH 2 TIMES DAILY    rosuvastatin (CRESTOR) 10 MG tablet TAKE 1 TABLET BY MOUTH ONCE A DAY FOR CHOLESTEROL.    Spacer/Aero-Holding Chambers (AEROCHAMBER PLUS FLO-VU MEDIUM) MISC Use daily as needed with inhaler    TRUEPLUS 5-BEVEL PEN NEEDLES 31G X 6 MM MISC     TRULICITY 1.5 MG/0.5ML SOPN Inject 1.5 mg as directed once a week.    No facility-administered encounter medications on file as of 11/28/2022.    Recent vitals BP Readings from Last 3 Encounters:  08/01/22 (!) 152/82  05/30/22 123/82  05/02/22 (!) 140/62   Pulse Readings from Last 3 Encounters:  05/02/22 85  02/14/22 84  01/21/22 91   Wt Readings from Last 3  Encounters:  10/16/22 (!) 315 lb (142.9 kg)  05/02/22 (!) 315 lb (142.9 kg)  02/14/22 (!) 312 lb (141.5 kg)   BMI Readings from Last 3 Encounters:  10/16/22 45.20 kg/m  05/02/22 45.20 kg/m  02/14/22 44.77 kg/m    Recent lab results    Component Value Date/Time   NA 140 05/02/2022 1150   NA 137 09/07/2020 0000   K 4.1 05/02/2022 1150   CL 99 05/02/2022 1150   CO2 32 05/02/2022 1150   GLUCOSE 108 (H) 05/02/2022 1150   BUN 14 05/02/2022 1150   BUN 18 09/07/2020 0000    CREATININE 0.95 05/02/2022 1150   CALCIUM 10.6 (H) 05/02/2022 1150    Lab Results  Component Value Date   CREATININE 0.95 05/02/2022   GFR 81.61 05/02/2022   GFRNONAA 76 02/19/2015   GFRAA 88 02/19/2015   Lab Results  Component Value Date/Time   HGBA1C 7.4 (H) 05/02/2022 11:50 AM   HGBA1C 7.2 01/02/2022 12:00 AM   HGBA1C 7.6 09/07/2020 12:00 AM   MICROALBUR <0.7 05/02/2022 11:50 AM   MICROALBUR 1.1 06/01/2016 07:50 AM    Lab Results  Component Value Date   CHOL 96 05/02/2022   HDL 27.60 (L) 05/02/2022   LDLCALC 30 05/02/2022   TRIG 194.0 (H) 05/02/2022   CHOLHDL 3 05/02/2022    Care Gaps: Annual wellness visit in last year? Yes  If Diabetic: Last eye exam / retinopathy screening:UTD Last diabetic foot exam:2022 Last UACR: 14.0  Star Rating Drugs:  Medication:  Last Fill: Day Supply Tresiba  03/13/22 77 Metformin 1000mg  02/20/22  90 Novolog  03/13/22 30 Ramipril 10mg   01/20/22  90 Rosuvastatin 10mg  02/21/22  90 Trulicity   Al Corpus, PharmD notified  Burt Knack, [facility] Clinical Pharmacy Assistant [phone]

## 2023-02-05 ENCOUNTER — Telehealth: Payer: Self-pay

## 2023-02-22 DIAGNOSIS — B351 Tinea unguium: Secondary | ICD-10-CM | POA: Diagnosis not present

## 2023-02-22 DIAGNOSIS — E1140 Type 2 diabetes mellitus with diabetic neuropathy, unspecified: Secondary | ICD-10-CM | POA: Diagnosis not present

## 2023-02-22 DIAGNOSIS — L851 Acquired keratosis [keratoderma] palmaris et plantaris: Secondary | ICD-10-CM | POA: Diagnosis not present

## 2023-02-22 DIAGNOSIS — Z794 Long term (current) use of insulin: Secondary | ICD-10-CM | POA: Diagnosis not present

## 2023-03-20 DIAGNOSIS — I10 Essential (primary) hypertension: Secondary | ICD-10-CM | POA: Diagnosis not present

## 2023-03-20 DIAGNOSIS — E1159 Type 2 diabetes mellitus with other circulatory complications: Secondary | ICD-10-CM | POA: Diagnosis not present

## 2023-03-20 DIAGNOSIS — E039 Hypothyroidism, unspecified: Secondary | ICD-10-CM | POA: Diagnosis not present

## 2023-03-20 LAB — HEMOGLOBIN A1C: Hemoglobin A1C: 7.4

## 2023-03-26 DIAGNOSIS — E039 Hypothyroidism, unspecified: Secondary | ICD-10-CM | POA: Diagnosis not present

## 2023-03-26 DIAGNOSIS — I10 Essential (primary) hypertension: Secondary | ICD-10-CM | POA: Diagnosis not present

## 2023-03-26 DIAGNOSIS — E1142 Type 2 diabetes mellitus with diabetic polyneuropathy: Secondary | ICD-10-CM | POA: Diagnosis not present

## 2023-03-26 DIAGNOSIS — E1159 Type 2 diabetes mellitus with other circulatory complications: Secondary | ICD-10-CM | POA: Diagnosis not present

## 2023-03-27 DIAGNOSIS — J449 Chronic obstructive pulmonary disease, unspecified: Secondary | ICD-10-CM | POA: Diagnosis not present

## 2023-03-27 DIAGNOSIS — G4733 Obstructive sleep apnea (adult) (pediatric): Secondary | ICD-10-CM | POA: Diagnosis not present

## 2023-03-30 ENCOUNTER — Other Ambulatory Visit: Payer: Self-pay | Admitting: Primary Care

## 2023-03-30 DIAGNOSIS — J309 Allergic rhinitis, unspecified: Secondary | ICD-10-CM

## 2023-03-30 NOTE — Telephone Encounter (Signed)
Patient is due for CPE/follow up in late October, this will be required prior to any further refills.  Please schedule, thank you!

## 2023-04-02 NOTE — Telephone Encounter (Signed)
Spoke to pt, scheduled cpe for 05/08/23

## 2023-04-05 ENCOUNTER — Other Ambulatory Visit: Payer: Self-pay | Admitting: Primary Care

## 2023-04-05 DIAGNOSIS — J309 Allergic rhinitis, unspecified: Secondary | ICD-10-CM

## 2023-05-08 ENCOUNTER — Ambulatory Visit: Payer: PPO | Admitting: Primary Care

## 2023-05-08 ENCOUNTER — Encounter: Payer: Self-pay | Admitting: Primary Care

## 2023-05-08 VITALS — BP 134/66 | HR 85 | Temp 98.1°F | Ht 70.0 in | Wt 311.0 lb

## 2023-05-08 DIAGNOSIS — J449 Chronic obstructive pulmonary disease, unspecified: Secondary | ICD-10-CM

## 2023-05-08 DIAGNOSIS — E7800 Pure hypercholesterolemia, unspecified: Secondary | ICD-10-CM | POA: Diagnosis not present

## 2023-05-08 DIAGNOSIS — M170 Bilateral primary osteoarthritis of knee: Secondary | ICD-10-CM

## 2023-05-08 DIAGNOSIS — Z0001 Encounter for general adult medical examination with abnormal findings: Secondary | ICD-10-CM

## 2023-05-08 DIAGNOSIS — M25562 Pain in left knee: Secondary | ICD-10-CM

## 2023-05-08 DIAGNOSIS — E039 Hypothyroidism, unspecified: Secondary | ICD-10-CM

## 2023-05-08 DIAGNOSIS — Z860100 Personal history of colon polyps, unspecified: Secondary | ICD-10-CM

## 2023-05-08 DIAGNOSIS — Z87891 Personal history of nicotine dependence: Secondary | ICD-10-CM | POA: Diagnosis not present

## 2023-05-08 DIAGNOSIS — Z0000 Encounter for general adult medical examination without abnormal findings: Secondary | ICD-10-CM

## 2023-05-08 DIAGNOSIS — I2510 Atherosclerotic heart disease of native coronary artery without angina pectoris: Secondary | ICD-10-CM

## 2023-05-08 DIAGNOSIS — E1165 Type 2 diabetes mellitus with hyperglycemia: Secondary | ICD-10-CM

## 2023-05-08 DIAGNOSIS — K760 Fatty (change of) liver, not elsewhere classified: Secondary | ICD-10-CM

## 2023-05-08 DIAGNOSIS — I10 Essential (primary) hypertension: Secondary | ICD-10-CM

## 2023-05-08 DIAGNOSIS — G8929 Other chronic pain: Secondary | ICD-10-CM | POA: Insufficient documentation

## 2023-05-08 DIAGNOSIS — Z125 Encounter for screening for malignant neoplasm of prostate: Secondary | ICD-10-CM | POA: Diagnosis not present

## 2023-05-08 DIAGNOSIS — Z23 Encounter for immunization: Secondary | ICD-10-CM

## 2023-05-08 DIAGNOSIS — Z794 Long term (current) use of insulin: Secondary | ICD-10-CM

## 2023-05-08 DIAGNOSIS — M25561 Pain in right knee: Secondary | ICD-10-CM

## 2023-05-08 LAB — SEDIMENTATION RATE: Sed Rate: 20 mm/h (ref 0–20)

## 2023-05-08 LAB — HEPATIC FUNCTION PANEL
ALT: 30 U/L (ref 0–53)
AST: 32 U/L (ref 0–37)
Albumin: 4.4 g/dL (ref 3.5–5.2)
Alkaline Phosphatase: 47 U/L (ref 39–117)
Bilirubin, Direct: 0.1 mg/dL (ref 0.0–0.3)
Total Bilirubin: 0.4 mg/dL (ref 0.2–1.2)
Total Protein: 7 g/dL (ref 6.0–8.3)

## 2023-05-08 LAB — C-REACTIVE PROTEIN: CRP: 2 mg/dL (ref 0.5–20.0)

## 2023-05-08 LAB — URIC ACID: Uric Acid, Serum: 4.6 mg/dL (ref 4.0–7.8)

## 2023-05-08 LAB — PSA, MEDICARE: PSA: 1.42 ng/mL (ref 0.10–4.00)

## 2023-05-08 NOTE — Assessment & Plan Note (Signed)
Continue rosuvastatin 10 mg daily. Lipid panel reviewed from February 2024 through [facility] per endocrinology.

## 2023-05-08 NOTE — Assessment & Plan Note (Signed)
Asymptomatic.  Reviewed CT chest lung cancer screening from January 2024.  Discussed results with patient today. Continue diabetes, lipid, blood pressure control.

## 2023-05-08 NOTE — Assessment & Plan Note (Signed)
Immunizations UTD. Influenza vaccine provided today. Colonoscopy UTD, due 2025. PSA due and pending.  Discussed the importance of a healthy diet and regular exercise in order for weight loss, and to reduce the risk of further co-morbidity.  Exam stable. Labs pending.  Follow up in 1 year for repeat physical.

## 2023-05-08 NOTE — Assessment & Plan Note (Addendum)
Controlled.  Continue amlodipine 5 mg daily, ramipril 10 mg twice daily, hydrochlorothiazide 50 mg daily.  BMP reviewed from [facility] from endocrinology in September 2024

## 2023-05-08 NOTE — Assessment & Plan Note (Signed)
Lung cancer screening CT chest reviewed from January 2024. Continue annual screenings.

## 2023-05-08 NOTE — Assessment & Plan Note (Signed)
Noted on CT chest from lung cancer screening in January 2024. Discussed potential cirrhosis, he and his wife agreed to proceed with right upper quadrant ultrasound. Ultrasound ordered and pending.

## 2023-05-08 NOTE — Progress Notes (Signed)
Subjective:    Patient ID: [name], male    DOB: 11/28/1950, 72 y.o.   MRN: [id number]  HPI  [name] is a very pleasant 72 y.o. male who presents today for complete physical and follow up of chronic conditions.  He would also like to discuss chronic joint pain. His joint pain is chronic to the bilateral knees, with intermittent swelling with ambulation. He applies heat to the knees which helps to reduce pain. He denies pain to the elbows, hips, feet. He spends most of his day in the chair, tries to elevate legs. Evaluated by Sports Medicine several times in the past, completed several rounds of steroid shots, pain improved for a few days only. He has not been seen by orthopedics. He completed knee xrays in 2020 which revealed severe tricompartmental arthritis.   Immunizations: -Tetanus: Completed in 2016 -Influenza: Influenza vaccine provided today.  -Shingles: Completed Shingrix series -Pneumonia: Completed Prevnar 13 in 2019, Pneumovax 23 in 2017  Diet: Fair diet.  Exercise: No regular exercise.  Eye exam: Completes annually  Dental exam: Completed years ago.   Colonoscopy: Completed in 2022, due 2025 Lung Cancer Screening: Completed in January 2024  PSA: Due  BP Readings from Last 3 Encounters:  05/08/23 134/66  08/01/22 (!) 152/82  05/30/22 123/82       Review of Systems  Constitutional:  Negative for unexpected weight change.  HENT:  Negative for rhinorrhea.   Respiratory:  Negative for cough and shortness of breath.   Cardiovascular:  Negative for chest pain.  Gastrointestinal:  Negative for constipation and diarrhea.  Genitourinary:  Negative for difficulty urinating.  Musculoskeletal:  Positive for arthralgias and joint swelling.  Skin:  Negative for rash.  Allergic/Immunologic: Negative for environmental allergies.  Neurological:  Negative for dizziness and headaches.  Psychiatric/Behavioral:  The patient is not nervous/anxious.           Past Medical History:  Diagnosis Date   Acute maxillary sinusitis 12/05/2020   Adenomatous polyp    Aortic atherosclerosis (HCC) 06/16/2020   Arthritis    knee   Chronic obstructive lung disease (HCC)    Colon polyp 2008   COPD with emphysema (HCC)    COVID-19 02/14/2022   Diabetes mellitus    Emphysema lung (HCC) 06/16/2020   Emphysema lung (HCC)    Emphysema of lung (HCC) 2016   Hypercholesteremia    Hyperlipemia    Hypertension    Hypothyroidism    Observed sleep apnea    Oxygen deficiency 2016   Sleep apnea    Statin myopathy     Social History   Socioeconomic History   Marital status: Married    Spouse name: Talbert Forest   Number of children: 3   Years of education: Not on file   Highest education level: Associate degree: occupational, Scientist, product/process development, or vocational program  Occupational History   Occupation: retired  Tobacco Use   Smoking status: Former    Current packs/day: 0.00    Average packs/day: 2.0 packs/day for 35.0 years (70.0 ttl pk-yrs)    Types: Cigarettes, E-cigarettes    Start date: 12/11/1979    Quit date: 12/11/2014    Years since quitting: 8.4   Smokeless tobacco: Never   Tobacco comments:    I don't smoke anymore.  Vaping Use   Vaping status: Never Used  Substance and Sexual Activity   Alcohol use: No   Drug use: No   Sexual activity: Not Currently    Birth control/protection:  None  Other Topics Concern   Not on file  Social History Narrative   Married.   3 children. 2 grandchildren.   Retired. He once worked as a Naval architect.   Enjoys working on projects around [employer].    One son lives next door, another stays with them sometimes   Social Determinants of Health   Financial Resource Strain: Low Risk  (05/02/2023)   Overall Financial Resource Strain (CARDIA)    Difficulty of Paying Living Expenses: Not hard at all  Food Insecurity: No Food Insecurity (05/02/2023)   Hunger Vital Sign    Worried About Running Out of Food in the  Last Year: Never true    Ran Out of Food in the Last Year: Never true  Transportation Needs: No Transportation Needs (05/02/2023)   PRAPARE - Administrator, Civil Service (Medical): No    Lack of Transportation (Non-Medical): No  Physical Activity: Insufficiently Active (05/02/2023)   Exercise Vital Sign    Days of Exercise per Week: 1 day    Minutes of Exercise per Session: 20 min  Stress: No Stress Concern Present (05/02/2023)   [employer] of Occupational Health - Occupational Stress Questionnaire    Feeling of Stress : Only a little  Social Connections: Socially Integrated (05/02/2023)   Social Connection and Isolation Panel [NHANES]    Frequency of Communication with Friends and Family: Three times a week    Frequency of Social Gatherings with Friends and Family: More than three times a week    Attends Religious Services: More than 4 times per year    Active Member of Clubs or Organizations: Yes    Attends Banker Meetings: More than 4 times per year    Marital Status: Married  Catering manager Violence: Not At Risk (10/16/2022)   Humiliation, Afraid, Rape, and Kick questionnaire    Fear of Current or Ex-Partner: No    Emotionally Abused: No    Physically Abused: No    Sexually Abused: No    Past Surgical History:  Procedure Laterality Date   COLONOSCOPY  2008, 2016   Dr Lemar Livings   COLONOSCOPY WITH PROPOFOL N/A 10/03/2017   Procedure: COLONOSCOPY WITH PROPOFOL;  Surgeon: Earline Mayotte, MD;  Location: [facility];  Service: Endoscopy;  Laterality: N/A;   COLONOSCOPY WITH PROPOFOL N/A 10/08/2020   Procedure: COLONOSCOPY WITH PROPOFOL;  Surgeon: Earline Mayotte, MD;  Location: [facility] END[facility]COPY;  Service: Endoscopy;  Laterality: N/A;   DERMATOFIBROMA  03/26/2015   Procedure: Excision mass left leg;  Surgeon: Earline Mayotte, MD;  Location: [facility] ORS;  Service: General;  Laterality: N/A;   LIPOMA EXCISION N/A 03/26/2015        TONSILLECTOMY      Family History  Problem Relation Age of Onset   Stroke Mother    Diabetes Mother    Early death Mother    Stroke Father    Arthritis Father    Hypertension Father    Diabetes Other    Other Daughter 25       precancerous colon polyp    Diabetes Maternal Grandmother    Diabetes Paternal Grandmother     Allergies  Allergen Reactions   Atorvastatin     Other reaction(s): Headache   Other Other (See Comments)    STATIN DRUG-CAUSED MUSCLE PAIN/LETHARGIC    Current Outpatient Medications on File Prior to Visit  Medication Sig Dispense Refill   albuterol (VENTOLIN HFA) 108 (90 Base) MCG/ACT inhaler INHALE  2 PUFFS INTO THE LUNGS EVERY 4 HOURS AS NEEDED FOR WHEEZING OR SHORTNESS OF BREATH 18 g 0   amLODipine (NORVASC) 5 MG tablet Take 1 tablet by mouth daily.     aspirin 81 MG EC tablet Take by mouth.     Continuous Blood Gluc Receiver (FREESTYLE LIBRE 2 READER) DEVI by Does not apply route.     Continuous Blood Gluc Sensor (FREESTYLE LIBRE 2 SENSOR) MISC by Does not apply route.     Dulaglutide (TRULICITY) 3 MG/0.5ML SOAJ Inject 3 mg into the skin.     fenofibrate (TRICOR) 145 MG tablet Take 1 tablet by mouth daily.     fluticasone (FLONASE) 50 MCG/ACT nasal spray Place 1 spray into both nostrils 2 (two) times daily. 16 g 0   hydrochlorothiazide (HYDRODIURIL) 50 MG tablet Take 1 tablet (50 mg total) by mouth daily. 90 tablet 1   insulin degludec (TRESIBA) 200 UNIT/ML FlexTouch Pen Inject 70 Units into the skin daily.     levocetirizine (XYZAL) 5 MG tablet TAKE ONE TABLET BY MOUTH ONCE EVERY EVENING FOR ALLERGIES 90 tablet 0   levothyroxine (SYNTHROID) 137 MCG tablet Take 137 mcg by mouth daily before breakfast.     metFORMIN (GLUCOPHAGE) 1000 MG tablet TAKE 1 TABLET BY MOUTH TWICE A DAY 180 tablet 1   NOVOLOG FLEXPEN 100 UNIT/ML FlexPen Inject 28 units before breakfast, inject 24 units beforelunch, inject 34 units before supper. Increase when blood sugar is over  150. Use 2 units more for every 50 over.  9   ramipril (ALTACE) 10 MG capsule TAKE 1 CAPSULE BY MOUTH 2 TIMES DAILY 180 capsule 1   rosuvastatin (CRESTOR) 10 MG tablet TAKE 1 TABLET BY MOUTH ONCE A DAY FOR CHOLESTEROL. 90 tablet 3   Spacer/Aero-Holding Chambers (AEROCHAMBER PLUS FLO-VU MEDIUM) MISC Use daily as needed with inhaler 1 each 0   TRUEPLUS 5-BEVEL PEN NEEDLES 31G X 6 MM MISC      No current facility-administered medications on file prior to visit.    BP 134/66   Pulse 85   Temp 98.1 F (36.7 C) (Temporal)   Ht 5\' 10"  (1.778 m)   Wt (!) 311 lb (141.1 kg)   SpO2 98%   BMI 44.62 kg/m  Objective:   Physical Exam HENT:     Right Ear: Tympanic membrane and ear canal normal.     Left Ear: Tympanic membrane and ear canal normal.  Eyes:     Pupils: Pupils are equal, round, and reactive to light.  Cardiovascular:     Rate and Rhythm: Normal rate and regular rhythm.  Pulmonary:     Effort: Pulmonary effort is normal.     Breath sounds: Normal breath sounds.  Abdominal:     General: Bowel sounds are normal.     Palpations: Abdomen is soft.     Tenderness: There is no abdominal tenderness.  Musculoskeletal:     Cervical back: Neck supple.     Right knee: Decreased range of motion.     Left knee: Decreased range of motion.  Skin:    General: Skin is warm and dry.  Neurological:     Mental Status: He is alert and oriented to person, place, and time.     Cranial Nerves: No cranial nerve deficit.     Deep Tendon Reflexes:     Reflex Scores:      Patellar reflexes are 2+ on the right side and 2+ on the left side. Psychiatric:  Mood and Affect: Mood normal.           Assessment & Plan:  Encounter for annual general medical examination with abnormal findings in adult Assessment & Plan: Immunizations UTD. Influenza vaccine provided today.  Colonoscopy UTD, due 2025 PSA due and pending.  Discussed the importance of a healthy diet and regular exercise in order  for weight loss, and to reduce the risk of further co-morbidity.  Exam stable. Labs pending.  Follow up in 1 year for repeat physical.    Chronic obstructive pulmonary disease, unspecified COPD type (HCC) Assessment & Plan: Controlled. Following with pulmonology.  Continue albuterol inhaler PRN for which he uses sparingly.    Essential (primary) hypertension Assessment & Plan: Controlled.  Continue amlodipine 5 mg daily, ramipril 10 mg twice daily, hydrochlorothiazide 50 mg daily.  BMP reviewed from [facility] from endocrinology in September 2024   Chronic pain of both knees Assessment & Plan: Likely severe osteoarthritis as mentioned in x-ray of the knees in 2020. Checking labs today to rule out autoimmune arthritis and gout.  Patient agrees. Labs pending.  Orders: -     Uric acid -     C-reactive protein -     Cyclic citrul peptide antibody, IgG -     Rheumatoid factor -     Sedimentation rate  Hepatic steatosis Assessment & Plan: Noted on CT chest from lung cancer screening in January 2024. Discussed potential cirrhosis, he and his wife agreed to proceed with right upper quadrant ultrasound. Ultrasound ordered and pending.  Orders: -     US ABDOMEN LIMITED RUQ (LIVER/GB); Future -     Hepatic function panel  Coronary artery disease involving native coronary artery of native heart without angina pectoris Assessment & Plan: Asymptomatic.  Reviewed CT chest lung cancer screening from January 2024.  Discussed results with patient today. Continue diabetes, lipid, blood pressure control.   Type 2 diabetes mellitus with hyperglycemia, with long-term current use of insulin [facility]) Assessment & Plan: Following with endocrinology, office notes and labs reviewed from September 2024.  Continue Tresiba 70 units daily, Humalog sliding scale, Trulicity 3 mg weekly, metformin 1000 mg twice daily.   Hypothyroidism, unspecified type Assessment & Plan: Following  with endocrinology, office notes reviewed from September 2024. Continue levothyroxine 137 mcg daily.   Primary osteoarthritis of both knees Assessment & Plan: Chronic.  Checking labs today to rule out gout and autoimmune arthritis. Consider referral to orthopedics versus rheumatology.   Former smoker Assessment & Plan: Lung cancer screening CT chest reviewed from January 2024. Continue annual screenings.   History of colonic polyps Assessment & Plan: Up-to-date, due 2025.  Discussed with patient today.   Hypercholesterolemia Assessment & Plan: Continue rosuvastatin 10 mg daily. Lipid panel reviewed from February 2024 through [facility] per endocrinology.   Encounter for immunization -     Flu Vaccine Trivalent High Dose (Fluad)  Screening for prostate cancer -     PSA, Medicare        Doreene Nest, NP

## 2023-05-08 NOTE — Assessment & Plan Note (Signed)
Controlled. Following with pulmonology.  Continue albuterol inhaler PRN for which he uses sparingly.

## 2023-05-08 NOTE — Assessment & Plan Note (Signed)
Following with endocrinology, office notes reviewed from September 2024. Continue levothyroxine 137 mcg daily.

## 2023-05-08 NOTE — Assessment & Plan Note (Signed)
Likely severe osteoarthritis as mentioned in x-ray of the knees in 2020. Checking labs today to rule out autoimmune arthritis and gout.  Patient agrees. Labs pending.

## 2023-05-08 NOTE — Assessment & Plan Note (Signed)
Following with endocrinology, office notes and labs reviewed from September 2024.  Continue Tresiba 70 units daily, Humalog sliding scale, Trulicity 3 mg weekly, metformin 1000 mg twice daily.

## 2023-05-08 NOTE — Assessment & Plan Note (Signed)
Up-to-date, due 2025.  Discussed with patient today.

## 2023-05-08 NOTE — Assessment & Plan Note (Signed)
Chronic.  Checking labs today to rule out gout and autoimmune arthritis. Consider referral to orthopedics versus rheumatology.

## 2023-05-09 LAB — CYCLIC CITRUL PEPTIDE ANTIBODY, IGG: Cyclic Citrullin Peptide Ab: <16 U

## 2023-05-09 LAB — RHEUMATOID FACTOR: Rheumatoid fact SerPl-aCnc: 21 [IU]/mL — ABNORMAL HIGH (ref <14)

## 2023-05-14 ENCOUNTER — Encounter: Payer: Self-pay | Admitting: *Deleted

## 2023-05-18 ENCOUNTER — Other Ambulatory Visit: Payer: Self-pay | Admitting: Primary Care

## 2023-05-18 DIAGNOSIS — I2510 Atherosclerotic heart disease of native coronary artery without angina pectoris: Secondary | ICD-10-CM

## 2023-05-18 DIAGNOSIS — E7800 Pure hypercholesterolemia, unspecified: Secondary | ICD-10-CM

## 2023-05-21 ENCOUNTER — Ambulatory Visit
Admission: RE | Admit: 2023-05-21 | Discharge: 2023-05-21 | Disposition: A | Discharge status: Home / Self Care | Payer: PPO | Source: Ambulatory Visit | Attending: Primary Care | Admitting: Primary Care

## 2023-05-21 DIAGNOSIS — K760 Fatty (change of) liver, not elsewhere classified: Secondary | ICD-10-CM | POA: Diagnosis not present

## 2023-05-21 DIAGNOSIS — Z09 Encounter for follow-up examination after completed treatment for conditions other than malignant neoplasm: Secondary | ICD-10-CM | POA: Diagnosis not present

## 2023-05-22 ENCOUNTER — Other Ambulatory Visit: Payer: Self-pay | Admitting: Primary Care

## 2023-05-22 DIAGNOSIS — M0580 Other rheumatoid arthritis with rheumatoid factor of unspecified site: Secondary | ICD-10-CM

## 2023-05-23 ENCOUNTER — Encounter: Payer: Self-pay | Admitting: *Deleted

## 2023-06-08 ENCOUNTER — Encounter: Payer: Self-pay | Admitting: Nurse Practitioner

## 2023-06-08 ENCOUNTER — Ambulatory Visit: Payer: PPO | Admitting: Nurse Practitioner

## 2023-06-08 VITALS — BP 138/70 | HR 84 | Temp 97.5°F | Ht 70.0 in | Wt 313.0 lb

## 2023-06-08 DIAGNOSIS — G4733 Obstructive sleep apnea (adult) (pediatric): Secondary | ICD-10-CM | POA: Diagnosis not present

## 2023-06-08 DIAGNOSIS — R600 Localized edema: Secondary | ICD-10-CM | POA: Diagnosis not present

## 2023-06-08 DIAGNOSIS — Z87891 Personal history of nicotine dependence: Secondary | ICD-10-CM

## 2023-06-08 DIAGNOSIS — R0609 Other forms of dyspnea: Secondary | ICD-10-CM

## 2023-06-08 DIAGNOSIS — J449 Chronic obstructive pulmonary disease, unspecified: Secondary | ICD-10-CM

## 2023-06-08 DIAGNOSIS — J309 Allergic rhinitis, unspecified: Secondary | ICD-10-CM

## 2023-06-08 NOTE — Patient Instructions (Signed)
Continue Albuterol inhaler 2 puffs every 6 hours as needed for shortness of breath or wheezing. Notify if symptoms persist despite rescue inhaler/neb use.   Continue to use CPAP every night, minimum of 4-6 hours a night, with oxygen bled through  Change equipment as directed. Wash your tubing with warm soap and water daily, hang to dry. Wash humidifier portion weekly. Use bottled, distilled water and change daily Be aware of reduced alertness and do not drive or operate heavy machinery if experiencing this or drowsiness.  Exercise encouraged, as tolerated. Healthy weight management discussed.  Avoid or decrease alcohol consumption and medications that make you more sleepy, if possible. Notify if persistent daytime sleepiness occurs even with consistent use of PAP therapy.  Echocardiogram ordered for further evaluation of your leg swelling  Wear the compression stockings you were previously prescribed If echo looks okay, it could be your amlodipine   Follow up in 4-6 months with Dr. Belia Heman or Philis Nettle. If symptoms do not improve or worsen, please contact office for sooner follow up or seek emergency care.

## 2023-06-08 NOTE — Progress Notes (Signed)
@Patient  ID: [name], male    DOB: 14-Nov-1950, 72 y.o.   MRN: [id number]  Chief Complaint  Patient presents with   Follow-up    Breathing is good. No SOB, wheezing or cough.  CPAP    Referring provider: Doreene Nest, NP  HPI: 72 year old male, former smoker followed for emphysema, chronic respiratory failure and OSA on CPAP. He is a former patient of Dr. Evlyn Courier and last seen in office 09/09/2020. Past medical history significant for HTN, CAD, allergic rhinitis, GERD, DM, hypothyroid, osteoarthritis, obesity.   TEST/EVENTS:  11/14/2017 HST: AHI 50, SpO2 low 71% 01/08/2018 PFT: FVC 69, FEV1 58, ratio 69, TLC 78, DLCOcor 44. No BD 07/21/2022 LDCT chest: atherosclerosis. Stable mediastinal nodes. Emphysema. Stable pulmonary nodules, up to 4.4 mm. Lung RADS 2. Liver steatosis and possible cirrhosis.   09/09/2020: OV with Dr. Craige Cotta. Uses CPAP with oxygen 1.5 lpm. No issues with mask fit. Feels rested. Not having cough, wheezing, or sputum. Hasn't needed albuterol. Had follow up low dose CT and findings stable. '  06/08/2023: Today - follow up Discussed the use of AI scribe software for clinical note transcription with the patient, who gave verbal consent to proceed.  History of Present Illness   The patient, with a history of emphysema and sleep apnea, reports he is doing well. His breathing feels like it does pretty well. He is relatively sedentary but this is due to chronic knee pain and mobility issues. Breathing doesn't limit him. No recent use of a rescue inhaler. The patient denies any cough or wheezing, chest congestion.   The patient uses a CPAP machine for sleep apnea, with usage on about 77% of nights for more than four hours. He reports having to get up twice a night to use the bathroom, which interrupts his CPAP usage. The patient's partner notes that the patient may experience congestion from allergies/colds and doesn't tend to wear his CPAP on those nights. He feels well  rested with CPAP. Sleeps well with it. He denies excessive sleepiness, drowsy driving, morning headaches. No sleep parasomnias.   The patient also reports some swelling in his legs, which has been more prevalent over the last year or so. He does have a prescription for TED hose but has not used these yet. He has not had an echocardiogram in years. He denies an CP, orthopnea, PND, dizziness, palpitations. He is on amlodipine. He also takes hydrochlorothiazide.       Allergies  Allergen Reactions   Atorvastatin     Other reaction(s): Headache   Other Other (See Comments)    STATIN DRUG-CAUSED MUSCLE PAIN/LETHARGIC    Immunization History  Administered Date(s) Administered   Fluad Quad(high Dose 65+) 04/15/2019, 04/26/2020, 04/27/2021, 05/02/2022   Fluad Trivalent(High Dose 65+) 05/08/2023   Influenza Split 04/26/2007   Influenza,inj,Quad PF,6+ Mos 03/18/2015, 05/10/2017, 04/08/2018   Influenza-Unspecified 03/18/2015, 04/16/2016, 05/10/2017   PFIZER Comirnaty(Gray Top)Covid-19 Tri-Sucrose Vaccine 09/16/2019, 10/07/2019   PFIZER(Purple Top)SARS-COV-2 Vaccination 07/19/2020   Pneumococcal Conjugate-13 09/25/2017   Pneumococcal Polysaccharide-23 06/19/2011, 06/05/2016   Tdap 06/19/2011, 04/01/2015   Zoster Recombinant(Shingrix) 04/15/2019, 06/17/2019    Past Medical History:  Diagnosis Date   Acute maxillary sinusitis 12/05/2020   Adenomatous polyp    Aortic atherosclerosis (HCC) 06/16/2020   Arthritis    knee   Chronic obstructive lung disease (HCC)    Colon polyp 2008   COPD with emphysema (HCC)    COVID-19 02/14/2022   Diabetes mellitus    Emphysema lung (  HCC) 06/16/2020   Emphysema lung (HCC)    Emphysema of lung (HCC) 2016   Hypercholesteremia    Hyperlipemia    Hypertension    Hypothyroidism    Observed sleep apnea    Oxygen deficiency 2016   Sleep apnea    Statin myopathy     Tobacco History: Social History   Tobacco Use  Smoking Status Former   Current  packs/day: 0.00   Average packs/day: 2.0 packs/day for 35.0 years (70.0 ttl pk-yrs)   Types: Cigarettes, E-cigarettes   Start date: 12/11/1979   Quit date: 12/11/2014   Years since quitting: 8.4  Smokeless Tobacco Never  Tobacco Comments   I don't smoke anymore.   Counseling given: Not Answered Tobacco comments: I don't smoke anymore.   Outpatient Medications Prior to Visit  Medication Sig Dispense Refill   albuterol (VENTOLIN HFA) 108 (90 Base) MCG/ACT inhaler INHALE 2 PUFFS INTO THE LUNGS EVERY 4 HOURS AS NEEDED FOR WHEEZING OR SHORTNESS OF BREATH 18 g 0   amLODipine (NORVASC) 5 MG tablet Take 1 tablet by mouth daily.     aspirin 81 MG EC tablet Take by mouth.     Continuous Blood Gluc Receiver (FREESTYLE LIBRE 2 READER) DEVI by Does not apply route.     Continuous Blood Gluc Sensor (FREESTYLE LIBRE 2 SENSOR) MISC by Does not apply route.     Dulaglutide (TRULICITY) 3 MG/0.5ML SOAJ Inject 3 mg into the skin.     fenofibrate (TRICOR) 145 MG tablet Take 1 tablet by mouth daily.     fluticasone (FLONASE) 50 MCG/ACT nasal spray Place 1 spray into both nostrils 2 (two) times daily. 16 g 0   hydrochlorothiazide (HYDRODIURIL) 50 MG tablet Take 1 tablet (50 mg total) by mouth daily. 90 tablet 1   insulin degludec (TRESIBA) 200 UNIT/ML FlexTouch Pen Inject 70 Units into the skin daily.     levocetirizine (XYZAL) 5 MG tablet TAKE ONE TABLET BY MOUTH ONCE EVERY EVENING FOR ALLERGIES 90 tablet 0   levothyroxine (SYNTHROID) 137 MCG tablet Take 137 mcg by mouth daily before breakfast.     metFORMIN (GLUCOPHAGE) 1000 MG tablet TAKE 1 TABLET BY MOUTH TWICE A DAY 180 tablet 1   NOVOLOG FLEXPEN 100 UNIT/ML FlexPen Inject 28 units before breakfast, inject 24 units beforelunch, inject 34 units before supper. Increase when blood sugar is over 150. Use 2 units more for every 50 over.  9   ramipril (ALTACE) 10 MG capsule TAKE 1 CAPSULE BY MOUTH 2 TIMES DAILY 180 capsule 1   rosuvastatin (CRESTOR) 10 MG  tablet TAKE ONE TABLET BY MOUTH ONCE A DAY FOR CHOLESTEROL. 90 tablet 3   Spacer/Aero-Holding Chambers (AEROCHAMBER PLUS FLO-VU MEDIUM) MISC Use daily as needed with inhaler 1 each 0   TRUEPLUS 5-BEVEL PEN NEEDLES 31G X 6 MM MISC      No facility-administered medications prior to visit.     Review of Systems:   Constitutional: No weight loss or gain, night sweats, fevers, chills, fatigue, or lassitude. HEENT: No headaches, difficulty swallowing, tooth/dental problems, or sore throat. No sneezing, itching, ear ache. +occasional nasal congestion CV:  +swelling in lower extremities. No chest pain, orthopnea, PND, anasarca, dizziness, palpitations, syncope Resp: No shortness of breath with exertion or at rest. No excess mucus or change in color of mucus. No productive or non-productive. No hemoptysis. No wheezing.  No chest wall deformity GI:  No heartburn, indigestion, abdominal pain, nausea, vomiting, diarrhea, change in bowel habits, loss of appetite,  bloody stools.  GU: No dysuria, change in color of urine, urgency or frequency.  No flank pain, no hematuria  Skin: No rash, lesions, ulcerations MSK:  +chronic knee pain Neuro: No dizziness or lightheadedness.  Psych: No depression or anxiety. Mood stable.     Physical Exam:  BP 138/70 (BP Location: Right Arm, Cuff Size: Large)   Pulse 84   Temp (!) 97.5 F (36.4 C)   Ht 5\' 10"  (1.778 m)   Wt (!) 313 lb (142 kg)   SpO2 98%   BMI 44.91 kg/m   GEN: Pleasant, interactive, well-kempt; morbidly obese; in no acute distress HEENT:  Normocephalic and atraumatic. PERRLA. Sclera white. Nasal turbinates pink, moist and patent bilaterally. No rhinorrhea present. Oropharynx pink and moist, without exudate or edema. No lesions, ulcerations, or postnasal drip. Mallampati III NECK:  Supple w/ fair ROM. No JVD present. Normal carotid impulses w/o bruits. Thyroid symmetrical with no goiter or nodules palpated. No lymphadenopathy.   CV: RRR, no  m/r/g, +1 BLE edema. Pulses intact, +2 bilaterally. No cyanosis, pallor or clubbing. PULMONARY:  Unlabored, regular breathing. Clear bilaterally A&P w/o wheezes/rales/rhonchi. No accessory muscle use.  GI: BS present and normoactive. Soft, non-tender to palpation. No organomegaly or masses detected.  MSK: No erythema, warmth or tenderness. Cap refil <2 sec all extrem. No deformities or joint swelling noted.  Neuro: A/Ox3. No focal deficits noted.   Skin: Warm, no lesions or rashe Psych: Normal affect and behavior. Judgement and thought content appropriate.     Lab Results:  CBC    Component Value Date/Time   WBC 8.1 02/19/2015 0923   WBC 6.9 01/12/2012 1909   RBC 4.68 02/19/2015 0923   RBC 4.40 01/12/2012 1909   HGB 14.2 02/19/2015 0923   HCT 41.3 02/19/2015 0923   PLT 269 02/19/2015 0923   MCV 88 02/19/2015 0923   MCH 30.3 02/19/2015 0923   MCH 30.9 01/12/2012 1909   MCHC 34.4 02/19/2015 0923   MCHC 34.8 01/12/2012 1909   RDW 13.0 02/19/2015 0923   LYMPHSABS 2.6 02/19/2015 0923   EOSABS 0.2 02/19/2015 0923   BASOSABS 0.0 02/19/2015 0923    BMET    Component Value Date/Time   NA 140 05/02/2022 1150   NA 137 09/07/2020 0000   K 4.1 05/02/2022 1150   CL 99 05/02/2022 1150   CO2 32 05/02/2022 1150   GLUCOSE 108 (H) 05/02/2022 1150   BUN 14 05/02/2022 1150   BUN 18 09/07/2020 0000   CREATININE 0.95 05/02/2022 1150   CALCIUM 10.6 (H) 05/02/2022 1150   GFRNONAA 76 02/19/2015 0923   GFRAA 88 02/19/2015 0923    BNP No results found for: "BNP"   Imaging:  US ABDOMEN LIMITED RUQ (LIVER/GB)  Result Date: 05/21/2023 CLINICAL DATA:  Follow-up CT scan EXAM: ULTRASOUND ABDOMEN LIMITED RIGHT UPPER QUADRANT COMPARISON:  Chest CT 07/21/2022 FINDINGS: Gallbladder: No gallstones or wall thickening visualized. No sonographic Murphy sign noted by sonographer. Common bile duct: Diameter: 4.8 mm Liver: Increased echogenicity. No focal lesion. Portal vein is patent on color Doppler  imaging with normal direction of blood flow towards the liver. Other: None. IMPRESSION: 1. Increased hepatic parenchymal echogenicity suggestive of steatosis. 2. No cholelithiasis or sonographic evidence for acute cholecystitis. Electronically Signed   By: Annia Belt M.D.   On: 05/21/2023 08:30    Administration History     None           No data to display  No results found for: "NITRICOXIDE"      Assessment & Plan:      Chronic Obstructive Pulmonary Disease (COPD) COPD is well-managed with no recent use of rescue inhaler in over six months. Daily activities are limited by knee pain rather than respiratory issues. No recent exacerbations or hospitalizations. Up to date on vaccines. Action plan in place. - No need for scheduled bronchodilator therapy at this point - Continue PRN SABA  - Ensure rescue inhaler is available if needed  Obstructive Sleep Apnea Obstructive sleep apnea managed with CPAP. Usage is at 77% of nights with more than four hours of use. Advised trying Flonase nasal spray 20-30 minutes before CPAP to help with congestion. Advised to leave CPAP mask on and disconnect the tube when getting up at night to improve compliance. Aware of risks of untreated OSA. Receives benefit from use. Excellent control on download. Understands proper care/use of device. Safe driving practices reviewed.  - Continue CPAP therapy  - Maintain clean CPAP and change supplies as recommended - Use Flonase nasal spray 20-30 minutes before CPAP if experiencing congestion - Leave CPAP mask on and disconnect the tube when getting up at night - Healthy weight loss encouraged   Peripheral Edema Mild leg swelling, possibly related to amlodipine use. He does have OSA and emphysema, so question component of PH. No significant impact on breathing. Echocardiogram needed for evaluation. Ted hose can help manage swelling by promoting venous return. - Order echocardiogram to evaluate for  potential pulmonary hypertension or other cardiac issues - Encourage use of TED hose to manage swelling - Monitor sodium intake - Monitor weights at home   Former smoker Quit 2016. 70 pack year history. Follows with lung cancer screening program. LDCT chest 07/21/2022 Lung RADS 2.  - Attend LDCT chest January 2025  - Remain smoke free  Nocturnal hypoxia Stable without increased requirement. No exertional hypoxia.  - Continue supplemental oxygen 1.5 lpm bled through CPAP nightly - Goal O2 >88-90%  Obesity  BMI 44. Discussed correlation of obesity and OSA. Likely contributing to BLE edema as well. - Healthy weight loss encouraged       Advised if symptoms do not improve or worsen, to please contact office for sooner follow up or seek emergency care.   I spent 35 minutes of dedicated to the care of this patient on the date of this encounter to include pre-visit review of records, face-to-face time with the patient discussing conditions above, post visit ordering of testing, clinical documentation with the electronic health record, making appropriate referrals as documented, and communicating necessary findings to members of the patients care team.  Noemi Chapel, NP 06/08/2023  Pt aware and understands NP's role.

## 2023-06-12 DIAGNOSIS — E1140 Type 2 diabetes mellitus with diabetic neuropathy, unspecified: Secondary | ICD-10-CM | POA: Diagnosis not present

## 2023-06-12 DIAGNOSIS — B351 Tinea unguium: Secondary | ICD-10-CM | POA: Diagnosis not present

## 2023-06-12 DIAGNOSIS — Z794 Long term (current) use of insulin: Secondary | ICD-10-CM | POA: Diagnosis not present

## 2023-06-12 DIAGNOSIS — L851 Acquired keratosis [keratoderma] palmaris et plantaris: Secondary | ICD-10-CM | POA: Diagnosis not present

## 2023-06-26 ENCOUNTER — Telehealth: Payer: Self-pay | Admitting: Student

## 2023-06-26 NOTE — Telephone Encounter (Signed)
I have spoke with Mrs. [name] and we have the patient's Echo scheduled on 07/31/2023  @ 9:00am [facility]

## 2023-06-26 NOTE — Telephone Encounter (Signed)
Pt calling about echocardiogram. Needs on a Tuesday or Thursday if poss. Same day as CT if poss. He warmly asks. His # is [phone]

## 2023-07-06 ENCOUNTER — Other Ambulatory Visit: Payer: Self-pay | Admitting: Primary Care

## 2023-07-06 DIAGNOSIS — J309 Allergic rhinitis, unspecified: Secondary | ICD-10-CM

## 2023-07-23 ENCOUNTER — Inpatient Hospital Stay: Admission: RE | Admit: 2023-07-23 | Payer: PPO | Source: Ambulatory Visit

## 2023-07-24 ENCOUNTER — Ambulatory Visit
Admission: RE | Admit: 2023-07-24 | Discharge: 2023-07-24 | Disposition: A | Discharge status: Home / Self Care | Payer: PPO | Source: Ambulatory Visit | Attending: Acute Care | Admitting: Acute Care

## 2023-07-24 DIAGNOSIS — Z87891 Personal history of nicotine dependence: Secondary | ICD-10-CM | POA: Insufficient documentation

## 2023-07-24 DIAGNOSIS — Z122 Encounter for screening for malignant neoplasm of respiratory organs: Secondary | ICD-10-CM | POA: Insufficient documentation

## 2023-07-26 DIAGNOSIS — M79641 Pain in right hand: Secondary | ICD-10-CM | POA: Diagnosis not present

## 2023-07-26 DIAGNOSIS — M79672 Pain in left foot: Secondary | ICD-10-CM | POA: Diagnosis not present

## 2023-07-26 DIAGNOSIS — M2560 Stiffness of unspecified joint, not elsewhere classified: Secondary | ICD-10-CM | POA: Diagnosis not present

## 2023-07-26 DIAGNOSIS — M7732 Calcaneal spur, left foot: Secondary | ICD-10-CM | POA: Diagnosis not present

## 2023-07-26 DIAGNOSIS — I7090 Unspecified atherosclerosis: Secondary | ICD-10-CM | POA: Diagnosis not present

## 2023-07-26 DIAGNOSIS — M1811 Unilateral primary osteoarthritis of first carpometacarpal joint, right hand: Secondary | ICD-10-CM | POA: Diagnosis not present

## 2023-07-26 DIAGNOSIS — M7989 Other specified soft tissue disorders: Secondary | ICD-10-CM | POA: Diagnosis not present

## 2023-07-26 DIAGNOSIS — R768 Other specified abnormal immunological findings in serum: Secondary | ICD-10-CM | POA: Diagnosis not present

## 2023-07-26 DIAGNOSIS — M1712 Unilateral primary osteoarthritis, left knee: Secondary | ICD-10-CM | POA: Diagnosis not present

## 2023-07-26 DIAGNOSIS — M79671 Pain in right foot: Secondary | ICD-10-CM | POA: Diagnosis not present

## 2023-07-26 DIAGNOSIS — M2041 Other hammer toe(s) (acquired), right foot: Secondary | ICD-10-CM | POA: Diagnosis not present

## 2023-07-26 DIAGNOSIS — M25562 Pain in left knee: Secondary | ICD-10-CM | POA: Diagnosis not present

## 2023-07-26 DIAGNOSIS — M79642 Pain in left hand: Secondary | ICD-10-CM | POA: Diagnosis not present

## 2023-07-26 DIAGNOSIS — M19072 Primary osteoarthritis, left ankle and foot: Secondary | ICD-10-CM | POA: Diagnosis not present

## 2023-07-26 DIAGNOSIS — M2550 Pain in unspecified joint: Secondary | ICD-10-CM | POA: Diagnosis not present

## 2023-07-26 DIAGNOSIS — M25561 Pain in right knee: Secondary | ICD-10-CM | POA: Diagnosis not present

## 2023-07-26 DIAGNOSIS — M19071 Primary osteoarthritis, right ankle and foot: Secondary | ICD-10-CM | POA: Diagnosis not present

## 2023-07-26 DIAGNOSIS — M1711 Unilateral primary osteoarthritis, right knee: Secondary | ICD-10-CM | POA: Diagnosis not present

## 2023-07-31 ENCOUNTER — Ambulatory Visit
Admission: RE | Admit: 2023-07-31 | Discharge: 2023-07-31 | Disposition: A | Discharge status: Home / Self Care | Payer: PPO | Source: Ambulatory Visit | Attending: Nurse Practitioner

## 2023-07-31 DIAGNOSIS — G4730 Sleep apnea, unspecified: Secondary | ICD-10-CM | POA: Diagnosis not present

## 2023-07-31 DIAGNOSIS — I10 Essential (primary) hypertension: Secondary | ICD-10-CM | POA: Insufficient documentation

## 2023-07-31 DIAGNOSIS — R600 Localized edema: Secondary | ICD-10-CM | POA: Diagnosis not present

## 2023-07-31 DIAGNOSIS — I272 Pulmonary hypertension, unspecified: Secondary | ICD-10-CM | POA: Diagnosis not present

## 2023-07-31 DIAGNOSIS — R0609 Other forms of dyspnea: Secondary | ICD-10-CM | POA: Insufficient documentation

## 2023-07-31 DIAGNOSIS — J449 Chronic obstructive pulmonary disease, unspecified: Secondary | ICD-10-CM | POA: Insufficient documentation

## 2023-07-31 LAB — ECHOCARDIOGRAM COMPLETE
AR max vel: 2.84 cm2
AV Area VTI: 2.69 cm2
AV Area mean vel: 2.56 cm2
AV Mean grad: 3 mmHg
AV Peak grad: 5.2 mmHg
Ao pk vel: 1.14 m/s
Area-P 1/2: 7.29 cm2
MV VTI: 2.71 cm2
S' Lateral: 2.3 cm

## 2023-08-03 ENCOUNTER — Other Ambulatory Visit: Payer: Self-pay

## 2023-08-03 DIAGNOSIS — Z87891 Personal history of nicotine dependence: Secondary | ICD-10-CM

## 2023-08-03 DIAGNOSIS — Z122 Encounter for screening for malignant neoplasm of respiratory organs: Secondary | ICD-10-CM

## 2023-08-03 DIAGNOSIS — R0602 Shortness of breath: Secondary | ICD-10-CM

## 2023-08-07 NOTE — Addendum Note (Signed)
Addended by: Bonney Leitz on: 08/07/2023 04:57 PM   Modules accepted: Orders

## 2023-08-09 DIAGNOSIS — G4733 Obstructive sleep apnea (adult) (pediatric): Secondary | ICD-10-CM | POA: Diagnosis not present

## 2023-08-09 DIAGNOSIS — J449 Chronic obstructive pulmonary disease, unspecified: Secondary | ICD-10-CM | POA: Diagnosis not present

## 2023-08-14 ENCOUNTER — Encounter: Payer: Self-pay | Admitting: Primary Care

## 2023-09-13 DIAGNOSIS — E1140 Type 2 diabetes mellitus with diabetic neuropathy, unspecified: Secondary | ICD-10-CM | POA: Diagnosis not present

## 2023-09-13 DIAGNOSIS — Z794 Long term (current) use of insulin: Secondary | ICD-10-CM | POA: Diagnosis not present

## 2023-09-13 DIAGNOSIS — E039 Hypothyroidism, unspecified: Secondary | ICD-10-CM | POA: Diagnosis not present

## 2023-09-13 DIAGNOSIS — E1159 Type 2 diabetes mellitus with other circulatory complications: Secondary | ICD-10-CM | POA: Diagnosis not present

## 2023-09-13 DIAGNOSIS — L851 Acquired keratosis [keratoderma] palmaris et plantaris: Secondary | ICD-10-CM | POA: Diagnosis not present

## 2023-09-13 DIAGNOSIS — B351 Tinea unguium: Secondary | ICD-10-CM | POA: Diagnosis not present

## 2023-09-18 DIAGNOSIS — M0579 Rheumatoid arthritis with rheumatoid factor of multiple sites without organ or systems involvement: Secondary | ICD-10-CM | POA: Diagnosis not present

## 2023-09-18 DIAGNOSIS — Z79899 Other long term (current) drug therapy: Secondary | ICD-10-CM | POA: Diagnosis not present

## 2023-09-20 DIAGNOSIS — E1159 Type 2 diabetes mellitus with other circulatory complications: Secondary | ICD-10-CM | POA: Diagnosis not present

## 2023-09-20 DIAGNOSIS — I10 Essential (primary) hypertension: Secondary | ICD-10-CM | POA: Diagnosis not present

## 2023-09-20 DIAGNOSIS — E039 Hypothyroidism, unspecified: Secondary | ICD-10-CM | POA: Diagnosis not present

## 2023-09-20 DIAGNOSIS — E1142 Type 2 diabetes mellitus with diabetic polyneuropathy: Secondary | ICD-10-CM | POA: Diagnosis not present

## 2023-09-26 DIAGNOSIS — Z1211 Encounter for screening for malignant neoplasm of colon: Secondary | ICD-10-CM

## 2023-09-27 ENCOUNTER — Ambulatory Visit: Payer: PPO | Admitting: Internal Medicine

## 2023-09-27 ENCOUNTER — Encounter: Payer: Self-pay | Admitting: Internal Medicine

## 2023-09-27 VITALS — BP 146/72 | HR 80 | Temp 97.7°F | Ht 70.0 in | Wt 313.0 lb

## 2023-09-27 DIAGNOSIS — J449 Chronic obstructive pulmonary disease, unspecified: Secondary | ICD-10-CM

## 2023-09-27 DIAGNOSIS — G4733 Obstructive sleep apnea (adult) (pediatric): Secondary | ICD-10-CM

## 2023-09-27 NOTE — Progress Notes (Signed)
 SYNOPSIS 73 year old male, former smoker followed for emphysema, chronic respiratory failure and OSA on CPAP. He is a former patient of Dr. Evlyn Courier and last seen in office 09/09/2020. Past medical history significant for HTN, CAD, allergic rhinitis, GERD, DM, hypothyroid, osteoarthritis, obesity.   TEST/EVENTS:  11/14/2017 HST: AHI 50, SpO2 low 71% 01/08/2018 PFT: FVC 69, FEV1 58, ratio 69, TLC 78, DLCOcor 44. No BD 07/21/2022 LDCT chest: atherosclerosis. Stable mediastinal nodes. Emphysema. Stable pulmonary nodules, up to 4.4 mm. Lung RADS 2. Liver steatosis and possible cirrhosis.   09/09/2020: OV with Dr. Craige Cotta. Uses CPAP with oxygen 1.5 lpm. No issues with mask fit. Feels rested. Not having cough, wheezing, or sputum. Hasn't needed albuterol. Had follow up low dose CT and findings stable. '   CC Follow-up assessment for OSA Follow-up assessment for COPD Follow-up lung cancer screening program   HPI Regarding COPD No exacerbation at this time No evidence of heart failure at this time No evidence or signs of infection at this time No respiratory distress No fevers, chills, nausea, vomiting, diarrhea No evidence of lower extremity edema No evidence hemoptysis    Regarding OSA CPAP download reviewed in detail Excellent compliance 100%, AHI reduced Patient uses and benefits from therapy Using CPAP nightly and with naps Pressure setting is comfortable and is sleeping well. current prescription Auto CPAP 10-20 AHI reduced to 1.9  Regarding lung cancer program CT scan of the chest January 2025  No significant lung nodules or masses seen Follow-up annually as scheduled      Allergies  Allergen Reactions   Atorvastatin     Other reaction(s): Headache   Other Other (See Comments)    STATIN DRUG-CAUSED MUSCLE PAIN/LETHARGIC    Immunization History  Administered Date(s) Administered   Fluad Quad(high Dose 65+) 04/15/2019, 04/26/2020, 04/27/2021, 05/02/2022   Fluad  Trivalent(High Dose 65+) 05/08/2023   Influenza Split 04/26/2007   Influenza,inj,Quad PF,6+ Mos 03/18/2015, 05/10/2017, 04/08/2018   Influenza-Unspecified 03/18/2015, 04/16/2016, 05/10/2017   PFIZER Comirnaty(Gray Top)Covid-19 Tri-Sucrose Vaccine 09/16/2019, 10/07/2019   PFIZER(Purple Top)SARS-COV-2 Vaccination 07/19/2020   Pneumococcal Conjugate-13 09/25/2017   Pneumococcal Polysaccharide-23 06/19/2011, 06/05/2016   Tdap 06/19/2011, 04/01/2015   Zoster Recombinant(Shingrix) 04/15/2019, 06/17/2019    Past Medical History:  Diagnosis Date   Acute maxillary sinusitis 12/05/2020   Adenomatous polyp    Aortic atherosclerosis (HCC) 06/16/2020   Arthritis    knee   Chronic obstructive lung disease (HCC)    Colon polyp 2008   COPD with emphysema (HCC)    COVID-19 02/14/2022   Diabetes mellitus    Emphysema lung (HCC) 06/16/2020   Emphysema lung (HCC)    Emphysema of lung (HCC) 2016   Hypercholesteremia    Hyperlipemia    Hypertension    Hypothyroidism    Observed sleep apnea    Oxygen deficiency 2016   Sleep apnea    Statin myopathy     Tobacco History: Social History   Tobacco Use  Smoking Status Former   Current packs/day: 0.00   Average packs/day: 2.0 packs/day for 35.0 years (70.0 ttl pk-yrs)   Types: Cigarettes, E-cigarettes   Start date: 12/11/1979   Quit date: 12/11/2014   Years since quitting: 8.8  Smokeless Tobacco Never  Tobacco Comments   I don't smoke anymore.   Counseling given: Not Answered Tobacco comments: I don't smoke anymore.   Outpatient Medications Prior to Visit  Medication Sig Dispense Refill   albuterol (VENTOLIN HFA) 108 (90 Base) MCG/ACT inhaler INHALE 2 PUFFS INTO  THE LUNGS EVERY 4 HOURS AS NEEDED FOR WHEEZING OR SHORTNESS OF BREATH 18 g 0   amLODipine (NORVASC) 5 MG tablet Take 1 tablet by mouth daily.     aspirin 81 MG EC tablet Take by mouth.     Continuous Blood Gluc Receiver (FREESTYLE LIBRE 2 READER) DEVI by Does not apply route.      Continuous Blood Gluc Sensor (FREESTYLE LIBRE 2 SENSOR) MISC by Does not apply route.     Dulaglutide (TRULICITY) 3 MG/0.5ML SOAJ Inject 3 mg into the skin.     fenofibrate (TRICOR) 145 MG tablet Take 1 tablet by mouth daily.     fluticasone (FLONASE) 50 MCG/ACT nasal spray Place 1 spray into both nostrils 2 (two) times daily. 16 g 0   hydrochlorothiazide (HYDRODIURIL) 50 MG tablet Take 1 tablet (50 mg total) by mouth daily. 90 tablet 1   insulin degludec (TRESIBA) 200 UNIT/ML FlexTouch Pen Inject 70 Units into the skin daily.     levocetirizine (XYZAL) 5 MG tablet TAKE ONE TABLET BY MOUTH ONCE EVERY EVENING FOR ALLERGIES 90 tablet 2   levothyroxine (SYNTHROID) 137 MCG tablet Take 137 mcg by mouth daily before breakfast.     metFORMIN (GLUCOPHAGE) 1000 MG tablet TAKE 1 TABLET BY MOUTH TWICE A DAY 180 tablet 1   NOVOLOG FLEXPEN 100 UNIT/ML FlexPen Inject 28 units before breakfast, inject 24 units beforelunch, inject 34 units before supper. Increase when blood sugar is over 150. Use 2 units more for every 50 over.  9   ramipril (ALTACE) 10 MG capsule TAKE 1 CAPSULE BY MOUTH 2 TIMES DAILY 180 capsule 1   rosuvastatin (CRESTOR) 10 MG tablet TAKE ONE TABLET BY MOUTH ONCE A DAY FOR CHOLESTEROL. 90 tablet 3   Spacer/Aero-Holding Chambers (AEROCHAMBER PLUS FLO-VU MEDIUM) MISC Use daily as needed with inhaler 1 each 0   TRUEPLUS 5-BEVEL PEN NEEDLES 31G X 6 MM MISC      No facility-administered medications prior to visit.     Review of Systems: Gen:  Denies  fever, sweats, chills weight loss  HEENT: Denies blurred vision, double vision, ear pain, eye pain, hearing loss, nose bleeds, sore throat Cardiac:  No dizziness, chest pain or heaviness, chest tightness,edema, No JVD Resp:   No cough, -sputum production, -shortness of breath,-wheezing, -hemoptysis,  Other:  All other systems negative   Physical Examination:   General Appearance: No distress  EYES PERRLA, EOM intact.   NECK Supple, No  JVD Pulmonary: normal breath sounds, No wheezing.  CardiovascularNormal S1,S2.  No m/r/g.   Abdomen: Benign, Soft, non-tender. Neurology UE/LE 5/5 strength, no focal deficits Ext pulses intact, cap refill intact ALL OTHER ROS ARE NEGATIVE   Lab Results:  CBC    Component Value Date/Time   WBC 8.1 02/19/2015 0923   WBC 6.9 01/12/2012 1909   RBC 4.68 02/19/2015 0923   RBC 4.40 01/12/2012 1909   HGB 14.2 02/19/2015 0923   HCT 41.3 02/19/2015 0923   PLT 269 02/19/2015 0923   MCV 88 02/19/2015 0923   MCH 30.3 02/19/2015 0923   MCH 30.9 01/12/2012 1909   MCHC 34.4 02/19/2015 0923   MCHC 34.8 01/12/2012 1909   RDW 13.0 02/19/2015 0923   LYMPHSABS 2.6 02/19/2015 0923   EOSABS 0.2 02/19/2015 0923   BASOSABS 0.0 02/19/2015 0923    BMET    Component Value Date/Time   NA 140 05/02/2022 1150   NA 137 09/07/2020 0000   K 4.1 05/02/2022 1150   CL 99  05/02/2022 1150   CO2 32 05/02/2022 1150   GLUCOSE 108 (H) 05/02/2022 1150   BUN 14 05/02/2022 1150   BUN 18 09/07/2020 0000   CREATININE 0.95 05/02/2022 1150   CALCIUM 10.6 (H) 05/02/2022 1150   GFRNONAA 76 02/19/2015 0923   GFRAA 88 02/19/2015 0923         Assessment & Plan:  73 year old pleasant white male seen today for follow-up assessment for COPD and severe OSA, follow-up lung cancer screening program  COPD stable No indication for maintenance therapy at this time Continue to use albuterol as needed Avoid Allergens and Irritants Avoid secondhand smoke Avoid SICK contacts Recommend  Masking  when appropriate Recommend Keep up-to-date with vaccinations  Assessment of OSA AHI is 50 severe Patient has excellent compliance report Discussed in detail with patient OSA is well-controlled with CPAP Continue current prescription Auto CPAP 10-20 AHI reduced to 1.9 Patient uses and benefits from therapy Using CPAP nightly.  pressure setting is comfortable and she is sleeping well.  Patient Instructions Continue to  use CPAP every night, minimum of 4-6 hours a night.  Change equipment every 30 days or as directed by DME.  Wash your tubing with warm soap and water daily, hang to dry. Wash humidifier portion weekly. Use bottled, distilled water and change daily   Be aware of reduced alertness and do not drive or operate heavy machinery if experiencing this or drowsiness.  Exercise encouraged, as tolerated. Encouraged proper weight management.  Important to get eight or more hours of sleep  Limiting the use of the computer and television before bedtime.  Decrease naps during the day, so night time sleep will become enhanced.  Limit caffeine, and sleep deprivation.  HTN, stroke, uncontrolled diabetes and heart failure are potential risk factors.  Risk of untreated sleep apnea including cardiac arrhthymias, stroke, DM, pulm HTN.     Extensive smoking history 70-pack-year Lung cancer screening program low-dose CT chest January 2025  no significant findings Follow-up annually   Chronic Hypoxic resp failure due to COPD -Patient benefits from oxygen therapy 1.5L [place]  -recommend using oxygen as prescribed -patient needs this for survival Continue supplemental oxygen 1.5 lpm bled through CPAP nightly - Goal O2 >88-90%  Obesity -recommend significant weight loss -recommend changing diet  Deconditioned state -Recommend increased daily activity and exercise   MEDICATION ADJUSTMENTS/LABS AND TESTS ORDERED: Continue albuterol as needed Continue oxygen as prescribed Follow-up lung cancer screening Continue CPAP as prescribed Avoid Allergens and Irritants Avoid secondhand smoke Avoid SICK contacts Recommend  Masking  when appropriate Recommend Keep up-to-date with vaccinations  CURRENT MEDICATIONS REVIEWED AT LENGTH WITH PATIENT TODAY   Patient  satisfied with Plan of action and management. All questions answered   Follow up 6 months   I spent a total of 45 minutes reviewing chart data,  face-to-face evaluation with the patient, counseling and coordination of care as detailed above.      Lucie Leather, M.D.  [facility]  Medical Director [facility] Medical Director [facility] Cardio-Pulmonary Department

## 2023-09-27 NOTE — Patient Instructions (Addendum)
 Excellent Job A+ GOLD STAR!!  Continue CPAP as prescribed  Avoid Allergens and Irritants Avoid secondhand smoke Avoid SICK contacts Recommend  Masking  when appropriate Recommend Keep up-to-date with vaccinations   Be aware of reduced alertness and do not drive or operate heavy machinery if experiencing this or drowsiness.  Exercise encouraged, as tolerated. Encouraged proper weight management.  Important to get eight or more hours of sleep  Limiting the use of the computer and television before bedtime.  Decrease naps during the day, so night time sleep will become enhanced.  Limit caffeine, and sleep deprivation.   Continue albuterol as needed Continue oxygen as prescribed Follow-up lung cancer screening

## 2023-10-16 DIAGNOSIS — Z860100 Personal history of colon polyps, unspecified: Secondary | ICD-10-CM | POA: Diagnosis not present

## 2023-10-30 ENCOUNTER — Encounter: Payer: Self-pay | Admitting: Cardiology

## 2023-10-30 ENCOUNTER — Ambulatory Visit: Payer: PPO | Attending: Cardiology | Admitting: Cardiology

## 2023-10-30 VITALS — BP 137/71 | HR 83 | Ht 70.0 in | Wt 315.0 lb

## 2023-10-30 DIAGNOSIS — R600 Localized edema: Secondary | ICD-10-CM

## 2023-10-30 DIAGNOSIS — I2510 Atherosclerotic heart disease of native coronary artery without angina pectoris: Secondary | ICD-10-CM

## 2023-10-30 DIAGNOSIS — I10 Essential (primary) hypertension: Secondary | ICD-10-CM | POA: Diagnosis not present

## 2023-10-30 MED ORDER — SPIRONOLACTONE 25 MG PO TABS: 25.0000 mg | ORAL_TABLET | Freq: Every day | ORAL | 3 refills | Status: AC

## 2023-10-30 MED ORDER — TORSEMIDE 20 MG PO TABS: 20.0000 mg | ORAL_TABLET | Freq: Every day | ORAL | 3 refills | Status: AC

## 2023-10-30 NOTE — Patient Instructions (Signed)
 Medication Instructions:  Your physician recommends the following medication changes.  STOP TAKING: Norvasc  Hydrochlorothiazide   START TAKING: Torsemide (Demadex) 20 mg daily Spironolactone (Aldactone) 25 mg daily   *If you need a refill on your cardiac medications before your next appointment, please call your pharmacy*  Lab Work: Your provider would like for you to return in 10 days to have the following labs drawn: BMP.   Please go to [facility] [address] ([facility] [address], [place] [address] You do not need an appointment.  They are open from 8 am- 4:30 pm.  Lunch from 1:00 pm- 2:00 pm You DO NOT need to be fasting.  Testing/Procedures: No test ordered today   Follow-Up: At [facility], you and your health needs are our priority.  As part of our continuing mission to provide you with exceptional heart care, our providers are all part of one team.  This team includes your primary Cardiologist (physician) and Advanced Practice Providers or APPs (Physician Assistants and Nurse Practitioners) who all work together to provide you with the care you need, when you need it.  Your next appointment:   6 week(s)  Provider:   You may see Constancia Delton, MD or one of the following Advanced Practice Providers on your designated Care Team:   Laneta Pintos, NP Gildardo Labrador, PA-C Varney Gentleman, PA-C Cadence Turpin, PA-C Ronald Cockayne, NP Morey Ar, NP    We recommend signing up for the patient portal called "MyChart".  Sign up information is provided on this After Visit Summary.  MyChart is used to connect with patients for Virtual Visits (Telemedicine).  Patients are able to view lab/test results, encounter notes, upcoming appointments, etc.  Non-urgent messages can be sent to your provider as well.   To learn more about what you can do with MyChart, go to [email or web address].

## 2023-10-30 NOTE — Progress Notes (Signed)
 Cardiology Office Note:    Date:  10/30/2023   ID:  [name], DOB December 18, 1950, MRN [id number]  PCP:  Doreene Nest, NP   [facility] Providers Cardiologist:  None     Referring MD: Noemi Chapel, NP   Chief Complaint  Patient presents with   Follow-up    Feet swelling mainly the left leg    [name] is a 73 y.o. male who is being seen today for the evaluation of leg edema at the request of Noemi Chapel, NP.   History of Present Illness:    [name] is a 73 y.o. male with a hx of CAD (LAD, RCA calcifications on chest CT )hypertension, diabetes, former smoker x 30+ years, COPD, OSA on CPAP who presents due to leg edema.  States having bilateral leg swelling over the past year or so.  Denies chest pain or shortness of breath.  Has a family history of strokes.  Compliant with blood pressure medicines as prescribed.    Echocardiogram 07/31/2023 showed normal systolic function EF 65 to 70%, impaired relaxation.  Past Medical History:  Diagnosis Date   Acute maxillary sinusitis 12/05/2020   Adenomatous polyp    Aortic atherosclerosis (HCC) 06/16/2020   Arthritis    knee   Chronic obstructive lung disease (HCC)    Colon polyp 2008   COPD with emphysema (HCC)    COVID-19 02/14/2022   Diabetes mellitus    Emphysema lung (HCC) 06/16/2020   Emphysema lung (HCC)    Emphysema of lung (HCC) 2016   Hypercholesteremia    Hyperlipemia    Hypertension    Hypothyroidism    Observed sleep apnea    Oxygen deficiency 2016   Sleep apnea    Statin myopathy     Past Surgical History:  Procedure Laterality Date   COLONOSCOPY  2008, 2016   Dr Lemar Livings   COLONOSCOPY WITH PROPOFOL N/A 10/03/2017   Procedure: COLONOSCOPY WITH PROPOFOL;  Surgeon: Earline Mayotte, MD;  Location: [facility] ENDOSCOPY;  Service: Endoscopy;  Laterality: N/A;   COLONOSCOPY WITH PROPOFOL N/A 10/08/2020   Procedure: COLONOSCOPY WITH PROPOFOL;  Surgeon: Earline Mayotte, MD;   Location: [facility] ENDOSCOPY;  Service: Endoscopy;  Laterality: N/A;   DERMATOFIBROMA  03/26/2015   Procedure: Excision mass left leg;  Surgeon: Earline Mayotte, MD;  Location: [facility] ORS;  Service: General;  Laterality: N/A;   LIPOMA EXCISION N/A 03/26/2015       TONSILLECTOMY      Current Medications: Current Meds  Medication Sig   spironolactone (ALDACTONE) 25 MG tablet Take 1 tablet (25 mg total) by mouth daily.   torsemide (DEMADEX) 20 MG tablet Take 1 tablet (20 mg total) by mouth daily.     Allergies:   Atorvastatin and Other   Social History   Socioeconomic History   Marital status: Married    Spouse name: Talbert Forest   Number of children: 3   Years of education: Not on file   Highest education level: Associate degree: occupational, Scientist, product/process development, or vocational program  Occupational History   Occupation: retired  Tobacco Use   Smoking status: Former    Current packs/day: 0.00    Average packs/day: 2.0 packs/day for 35.0 years (70.0 ttl pk-yrs)    Types: Cigarettes, E-cigarettes    Start date: 12/11/1979    Quit date: 12/11/2014    Years since quitting: 8.8   Smokeless tobacco: Never  Vaping Use   Vaping status: Never Used  Substance and Sexual Activity   Alcohol use: No   Drug use: No   Sexual activity: Not Currently    Birth control/protection: None  Other Topics Concern   Not on file  Social History Narrative   Married.   3 children. 2 grandchildren.   Retired. He once worked as a Naval architect.   Enjoys working on projects around [employer].    One son lives next door, another stays with them sometimes   Social Drivers of Corporate investment banker Strain: Low Risk  (10/16/2023)   Received from [facility]   Overall Financial Resource Strain (CARDIA)    Difficulty of Paying Living Expenses: Not hard at all  Food Insecurity: No Food Insecurity (10/16/2023)   Received from [facility]   Hunger Vital Sign    Worried About Running  Out of Food in the Last Year: Never true    Ran Out of Food in the Last Year: Never true  Transportation Needs: No Transportation Needs (10/16/2023)   Received from [facility] - Transportation    In the past 12 months, has lack of transportation kept you from medical appointments or from getting medications?: No    Lack of Transportation (Non-Medical): No  Physical Activity: Insufficiently Active (05/02/2023)   Exercise Vital Sign    Days of Exercise per Week: 1 day    Minutes of Exercise per Session: 20 min  Stress: No Stress Concern Present (05/02/2023)   [employer] of Occupational Health - Occupational Stress Questionnaire    Feeling of Stress : Only a little  Social Connections: Socially Integrated (05/02/2023)   Social Connection and Isolation Panel [NHANES]    Frequency of Communication with Friends and Family: Three times a week    Frequency of Social Gatherings with Friends and Family: More than three times a week    Attends Religious Services: More than 4 times per year    Active Member of [employer] or Organizations: Yes    Attends Engineer, structural: More than 4 times per year    Marital Status: Married     Family History: The patient's family history includes Arthritis in his father; Diabetes in his maternal grandmother, mother, paternal grandmother, and another family member; Early death in his mother; Hypertension in his father; Other (age of onset: 62) in his daughter; Stroke in his father and mother.  ROS:   Please see the history of present illness.     All other systems reviewed and are negative.  EKGs/Labs/Other Studies Reviewed:    The following studies were reviewed today:  EKG Interpretation Date/Time:  Tuesday October 30 2023 40:98:11 EDT Ventricular Rate:  83 PR Interval:  168 QRS Duration:  80 QT Interval:  364 QTC Calculation: 427 R Axis:   78  Text Interpretation: Normal sinus rhythm Normal ECG Confirmed by  Debbe Odea ([id number]) on 10/30/2023 9:41:24 AM    Recent Labs: 05/08/2023: ALT 30  Recent Lipid Panel    Component Value Date/Time   CHOL 96 05/02/2022 1150   CHOL 151 02/19/2015 0923   TRIG 194.0 (H) 05/02/2022 1150   HDL 27.60 (L) 05/02/2022 1150   HDL 42 02/19/2015 0923   CHOLHDL 3 05/02/2022 1150   VLDL 38.8 05/02/2022 1150   LDLCALC 30 05/02/2022 1150   LDLCALC 82 02/19/2015 0923     Risk Assessment/Calculations:             Physical Exam:  VS:  BP 137/71 (BP Location: Left Arm, Patient Position: Sitting, Cuff Size: Normal)   Pulse 83   Ht 5\' 10"  (1.778 m)   Wt (!) 315 lb (142.9 kg)   SpO2 96%   BMI 45.20 kg/m     Wt Readings from Last 3 Encounters:  10/30/23 (!) 315 lb (142.9 kg)  09/27/23 (!) 313 lb (142 kg)  06/08/23 (!) 313 lb (142 kg)     GEN:  Well nourished, well developed in no acute distress HEENT: Normal NECK: No JVD; No carotid bruits CARDIAC: RRR, no murmurs, rubs, gallops RESPIRATORY:  Clear to auscultation without rales, wheezing or rhonchi  ABDOMEN: Soft, non-tender, non-distended MUSCULOSKELETAL:  2+ edema; No deformity  SKIN: Warm and dry NEUROLOGIC:  Alert and oriented x 3 PSYCHIATRIC:  Normal affect   ASSESSMENT:    1. Bilateral lower extremity edema   2. Primary hypertension   3. Coronary artery disease involving native coronary artery of native heart, unspecified whether angina present    PLAN:    In order of problems listed above:  Bilateral edema, 2+ on exam.  Echo with normal EF, impaired relaxation.  Etiology likely from morbid obesity.  Start torsemide 20 mg daily, Aldactone 25 mg daily.  Check BMP in 10 days.  Stop Norvasc in case this is contributing. Hypertension, BP controlled.  Stop HCTZ, stop Norvasc.  Start torsemide and Aldactone as above.  Continue ramipril 10 mg twice daily, Toprol-XL 25 mg daily. CAD, LAD, RCA calcifications.  Continue aspirin, Crestor 10 mg daily.  Follow-up in 6 weeks       Medication Adjustments/Labs and Tests Ordered: Current medicines are reviewed at length with the patient today.  Concerns regarding medicines are outlined above.  Orders Placed This Encounter  Procedures   Basic Metabolic Panel (BMET)   EKG 12-Lead   Meds ordered this encounter  Medications   torsemide (DEMADEX) 20 MG tablet    Sig: Take 1 tablet (20 mg total) by mouth daily.    Dispense:  120 tablet    Refill:  3   spironolactone (ALDACTONE) 25 MG tablet    Sig: Take 1 tablet (25 mg total) by mouth daily.    Dispense:  90 tablet    Refill:  3    Patient Instructions  Medication Instructions:  Your physician recommends the following medication changes.  STOP TAKING: Norvasc  Hydrochlorothiazide   START TAKING: Torsemide (Demadex) 20 mg daily Spironolactone (Aldactone) 25 mg daily   *If you need a refill on your cardiac medications before your next appointment, please call your pharmacy*  Lab Work: Your provider would like for you to return in 10 days to have the following labs drawn: BMP.   Please go to [facility] [address] ([facility]) [address], [place] [address] You do not need an appointment.  They are open from 8 am- 4:30 pm.  Lunch from 1:00 pm- 2:00 pm You DO NOT need to be fasting.  Testing/Procedures: No test ordered today   Follow-Up: At [facility], you and your health needs are our priority.  As part of our continuing mission to provide you with exceptional heart care, our providers are all part of one team.  This team includes your primary Cardiologist (physician) and Advanced Practice Providers or APPs (Physician Assistants and Nurse Practitioners) who all work together to provide you with the care you need, when you need it.  Your next appointment:   6 week(s)  Provider:  You may see Constancia Delton, MD or one of the following Advanced Practice Providers on your designated Care Team:   Laneta Pintos,  NP Gildardo Labrador, PA-C Varney Gentleman, PA-C Cadence [place], PA-C Ronald Cockayne, NP Morey Ar, NP    We recommend signing up for the patient portal called "MyChart".  Sign up information is provided on this After Visit Summary.  MyChart is used to connect with patients for Virtual Visits (Telemedicine).  Patients are able to view lab/test results, encounter notes, upcoming appointments, etc.  Non-urgent messages can be sent to your provider as well.   To learn more about what you can do with MyChart, go to [email or web address].         Signed, Constancia Delton, MD  10/30/2023 10:21 AM    [facility]

## 2023-11-09 ENCOUNTER — Other Ambulatory Visit: Payer: Self-pay | Admitting: *Deleted

## 2023-11-09 DIAGNOSIS — R600 Localized edema: Secondary | ICD-10-CM | POA: Diagnosis not present

## 2023-11-10 LAB — BASIC METABOLIC PANEL WITH GFR
BUN/Creatinine Ratio: 15 (ref 10–24)
BUN: 16 mg/dL (ref 8–27)
CO2: 23 mmol/L (ref 20–29)
Calcium: 9.6 mg/dL (ref 8.6–10.2)
Chloride: 102 mmol/L (ref 96–106)
Creatinine, Ser: 1.08 mg/dL (ref 0.76–1.27)
Glucose: 64 mg/dL — ABNORMAL LOW (ref 70–99)
Potassium: 4.2 mmol/L (ref 3.5–5.2)
Sodium: 141 mmol/L (ref 134–144)
eGFR: 73 mL/min/{1.73_m2} (ref >59)

## 2023-11-21 ENCOUNTER — Ambulatory Visit
Admission: RE | Admit: 2023-11-21 | Discharge: 2023-11-21 | Disposition: A | Discharge status: Home / Self Care | Attending: Surgery | Admitting: Surgery

## 2023-11-21 ENCOUNTER — Ambulatory Visit: Admitting: Certified Registered Nurse Anesthetist

## 2023-11-21 ENCOUNTER — Encounter
Admission: RE | Disposition: A | Discharge status: Home / Self Care | Payer: Self-pay | Source: Home / Self Care | Attending: Surgery

## 2023-11-21 ENCOUNTER — Encounter: Payer: Self-pay | Admitting: Surgery

## 2023-11-21 DIAGNOSIS — E66813 Obesity, class 3: Secondary | ICD-10-CM | POA: Diagnosis not present

## 2023-11-21 DIAGNOSIS — Z860100 Personal history of colon polyps, unspecified: Secondary | ICD-10-CM | POA: Diagnosis not present

## 2023-11-21 DIAGNOSIS — Z87891 Personal history of nicotine dependence: Secondary | ICD-10-CM | POA: Insufficient documentation

## 2023-11-21 DIAGNOSIS — K640 First degree hemorrhoids: Secondary | ICD-10-CM | POA: Diagnosis not present

## 2023-11-21 DIAGNOSIS — Z7951 Long term (current) use of inhaled steroids: Secondary | ICD-10-CM | POA: Insufficient documentation

## 2023-11-21 DIAGNOSIS — J449 Chronic obstructive pulmonary disease, unspecified: Secondary | ICD-10-CM | POA: Insufficient documentation

## 2023-11-21 DIAGNOSIS — K649 Unspecified hemorrhoids: Secondary | ICD-10-CM | POA: Diagnosis not present

## 2023-11-21 DIAGNOSIS — E039 Hypothyroidism, unspecified: Secondary | ICD-10-CM | POA: Diagnosis not present

## 2023-11-21 DIAGNOSIS — Z7985 Long-term (current) use of injectable non-insulin antidiabetic drugs: Secondary | ICD-10-CM | POA: Diagnosis not present

## 2023-11-21 DIAGNOSIS — I10 Essential (primary) hypertension: Secondary | ICD-10-CM | POA: Diagnosis not present

## 2023-11-21 DIAGNOSIS — Z79899 Other long term (current) drug therapy: Secondary | ICD-10-CM | POA: Insufficient documentation

## 2023-11-21 DIAGNOSIS — I2510 Atherosclerotic heart disease of native coronary artery without angina pectoris: Secondary | ICD-10-CM | POA: Diagnosis not present

## 2023-11-21 DIAGNOSIS — G4730 Sleep apnea, unspecified: Secondary | ICD-10-CM | POA: Diagnosis not present

## 2023-11-21 DIAGNOSIS — Z7984 Long term (current) use of oral hypoglycemic drugs: Secondary | ICD-10-CM | POA: Diagnosis not present

## 2023-11-21 DIAGNOSIS — Z1211 Encounter for screening for malignant neoplasm of colon: Secondary | ICD-10-CM | POA: Insufficient documentation

## 2023-11-21 DIAGNOSIS — K219 Gastro-esophageal reflux disease without esophagitis: Secondary | ICD-10-CM | POA: Insufficient documentation

## 2023-11-21 DIAGNOSIS — Z6841 Body Mass Index (BMI) 40.0 and over, adult: Secondary | ICD-10-CM | POA: Insufficient documentation

## 2023-11-21 DIAGNOSIS — E119 Type 2 diabetes mellitus without complications: Secondary | ICD-10-CM | POA: Diagnosis not present

## 2023-11-21 HISTORY — PX: COLONOSCOPY: SHX5424

## 2023-11-21 LAB — GLUCOSE, CAPILLARY: Glucose-Capillary: 213 mg/dL — ABNORMAL HIGH (ref 70–99)

## 2023-11-21 SURGERY — COLONOSCOPY
Anesthesia: General | Site: Rectum

## 2023-11-21 MED ORDER — DEXMEDETOMIDINE HCL IN NACL 80 MCG/20ML IV SOLN
INTRAVENOUS | Status: DC | PRN
  Administered 2023-11-21: 4 ug via INTRAVENOUS
  Administered 2023-11-21: 8 ug via INTRAVENOUS

## 2023-11-21 MED ORDER — LIDOCAINE HCL (CARDIAC) PF 100 MG/5ML IV SOSY
PREFILLED_SYRINGE | INTRAVENOUS | Status: DC | PRN
  Administered 2023-11-21: 50 mg via INTRAVENOUS

## 2023-11-21 MED ORDER — SODIUM CHLORIDE 0.9 % IV SOLN: INTRAVENOUS | Status: DC

## 2023-11-21 MED ORDER — PROPOFOL 10 MG/ML IV BOLUS
INTRAVENOUS | Status: DC | PRN
  Administered 2023-11-21: 125 ug/kg/min via INTRAVENOUS
  Administered 2023-11-21: 80 mg via INTRAVENOUS

## 2023-11-21 NOTE — H&P (Signed)
 Subjective:   CC: History of colon polyps [Z86.0100]  HPI: [name] is a 73 y.o. male who returns for evaluation of above. No complaints  Past Medical History: has a past medical history of Adenomatous polyp, Aortic atherosclerosis () (06/2020), Arthritis, Chronic obstructive lung disease (CMS/HHS-HCC), Emphysema lung (CMS/HHS-HCC) (06/2020), Hyperlipidemia, Hypertension, Hypothyroidism, Sleep apnea, and Type 2 diabetes mellitus (CMS/HHS-HCC).  Past Surgical History: has a past surgical history that includes Colonoscopy (09/2015); GROWTH ON ANKLE REMOVED (03/26/2015); Colonoscopy (2008); Tonsillectomy; and Colonoscopy (10/08/2020).  Family History: family history includes Back Pain in his son; Diabetes in his maternal grandmother, mother, and paternal grandmother; High blood pressure (Hypertension) in his father, maternal grandfather, mother, and paternal grandfather; No Known Problems in his daughter and son; Stroke in his father and paternal grandfather.  Social History: reports that he quit smoking about 8 years ago. His smoking use included cigarettes. He started smoking about 43 years ago. He has a 70 pack-year smoking history. He has never used smokeless tobacco. He reports that he does not drink alcohol. No history on file for drug use.  Current Medications: has a current medication list which includes the following prescription(s): albuterol  mdi (proventil , ventolin , proair ) hfa, amlodipine, aspirin, dulaglutide, fenofibrate  nanocrystallized, folic acid, hydrochlorothiazide , insulin  aspart, levocetirizine, levothyroxine , metformin, methotrexate, metoprolol succinate, pen needle, diabetic, ramipril, rosuvastatin , and tresiba flextouch u-200.  Allergies:  Allergies  Allergen Reactions  Atorvastatin Other (See Comments)  Other reaction(s): Headache  Other Other (See Comments)  STATIN DRUG-CAUSED MUSCLE PAIN/LETHARGIC   ROS:  A 15 point review of systems was performed and pertinent  positives and negatives noted in HPI  Objective:    BP 133/72  Pulse 77  Ht 177.8 cm (5\' 10" )  Wt (!) 142.9 kg (315 lb)  BMI 45.20 kg/m   Constitutional : No distress, cooperative, alert  Lymphatics/Throat: Supple with no lymphadenopathy  Respiratory: Clear to auscultation bilaterally  Cardiovascular: Regular rate and rhythm  Gastrointestinal: Soft, non-tender, non-distended, no organomegaly.  Musculoskeletal: Steady gait and movement  Skin: Cool and moist  Psychiatric: Normal affect, non-agitated, not confused     LABS:  N/a   RADS: N/a  Assessment:    History of colon polyps [Z86.0100] Previous colonoscopy noted 3 tubular adenomas. Recommended follow-up in 3 years from the 2022 colonoscopy. Will proceed with colonoscopy  Plan:    1. History of colon polyps [Z86.0100] R/b/a discussed. Risks include bleeding, perforation. Benefits include diagnostic, curative procedure if needed. Alternatives include continued observation. Pt verbalized understanding.  2. Patient has elected to proceed with surgical treatment. Procedure will be scheduled.  labs/images/medications/previous chart entries reviewed personally and relevant changes/updates noted above.

## 2023-11-21 NOTE — Anesthesia Preprocedure Evaluation (Addendum)
 Anesthesia Evaluation  Patient identified by MRN, date of birth, ID band Patient awake    Reviewed: Allergy & Precautions, NPO status , Patient's Chart, lab work & pertinent test results  History of Anesthesia Complications Negative for: history of anesthetic complications  Airway Mallampati: III  TM Distance: >3 FB Neck ROM: full    Dental  (+) Edentulous Lower, Edentulous Upper   Pulmonary sleep apnea and Continuous Positive Airway Pressure Ventilation , COPD,  COPD inhaler, former smoker   Pulmonary exam normal        Cardiovascular hypertension, On Medications + CAD  Normal cardiovascular exam     Neuro/Psych  negative psych ROS   GI/Hepatic Neg liver ROS,GERD  Medicated,,  Endo/Other  diabetesHypothyroidism  Class 3 obesity (BMI 45)  Renal/GU negative Renal ROS  negative genitourinary   Musculoskeletal   Abdominal   Peds  Hematology negative hematology ROS (+)   Anesthesia Other Findings Past Medical History: 12/05/2020: Acute maxillary sinusitis No date: Adenomatous polyp 06/16/2020: Aortic atherosclerosis (HCC) No date: Arthritis     Comment:  knee No date: Chronic obstructive lung disease (HCC) 2008: Colon polyp No date: COPD with emphysema (HCC) 02/14/2022: COVID-19 No date: Diabetes mellitus 06/16/2020: Emphysema lung (HCC) No date: Emphysema lung (HCC) 2016: Emphysema of lung (HCC) No date: Hypercholesteremia No date: Hyperlipemia No date: Hypertension No date: Hypothyroidism No date: Observed sleep apnea 2016: Oxygen  deficiency No date: Sleep apnea No date: Statin myopathy  Past Surgical History: 2008, 2016: COLONOSCOPY     Comment:  Dr Marquita Situ 10/03/2017: COLONOSCOPY WITH PROPOFOL ; N/A     Comment:  Procedure: COLONOSCOPY WITH PROPOFOL ;  Surgeon: Marshall Skeeter, MD;  Location: [facility] ENDOSCOPY;  Service:               Endoscopy;  Laterality: N/A; 10/08/2020:  COLONOSCOPY WITH PROPOFOL ; N/A     Comment:  Procedure: COLONOSCOPY WITH PROPOFOL ;  Surgeon: Marshall Skeeter, MD;  Location: [facility] ENDOSCOPY;  Service:               Endoscopy;  Laterality: N/A; 03/26/2015: DERMATOFIBROMA     Comment:  Procedure: Excision mass left leg;  Surgeon: Marshall Skeeter, MD;  Location: [facility] ORS;  Service: General;                Laterality: N/A; 03/26/2015: LIPOMA EXCISION; N/A     Comment:    No date: TONSILLECTOMY     Reproductive/Obstetrics negative OB ROS                             Anesthesia Physical Anesthesia Plan  ASA: 3  Anesthesia Plan: General   Post-op Pain Management: Minimal or no pain anticipated   Induction: Intravenous  PONV Risk Score and Plan: 1 and Propofol  infusion and TIVA  Airway Management Planned: Natural Airway and Nasal Cannula  Additional Equipment:   Intra-op Plan:   Post-operative Plan:   Informed Consent: I have reviewed the patients History and Physical, chart, labs and discussed the procedure including the risks, benefits and alternatives for the proposed anesthesia with the patient or authorized representative who has indicated his/her understanding and acceptance.     Dental Advisory Given  Plan Discussed with: Anesthesiologist, CRNA  and Surgeon  Anesthesia Plan Comments: (Patient consented for risks of anesthesia including but not limited to:  - adverse reactions to medications - risk of airway placement if required - damage to eyes, teeth, lips or other oral mucosa - nerve damage due to positioning  - sore throat or hoarseness - Damage to heart, brain, nerves, lungs, other parts of body or loss of life  Patient voiced understanding and assent.)        Anesthesia Quick Evaluation

## 2023-11-21 NOTE — Anesthesia Procedure Notes (Signed)
 Date/Time: 11/21/2023 9:14 AM  Performed by: Angelia Kelp, CRNAPre-anesthesia Checklist: Patient identified, Emergency Drugs available, Suction available, Patient being monitored and Timeout performed Patient Re-evaluated:Patient Re-evaluated prior to induction Oxygen  Delivery Method: Simple face mask Preoxygenation: Pre-oxygenation with 100% oxygen  Induction Type: IV induction

## 2023-11-21 NOTE — Op Note (Signed)
 [facility] Gastroenterology Patient Name: [name] Procedure Date: 11/21/2023 9:02 AM MRN: [id number] Account #: [id number] Date of Birth: 02-20-1951 Admit Type: Outpatient Age: 73 Room: [facility] ENDO ROOM 1 Gender: Male Note Status: Finalized Instrument Name: Charlyn Cooley [id number] Procedure:             Colonoscopy Indications:           High risk colon cancer surveillance: Personal history                         of colonic polyps Providers:             Conrado Delay MD, MD Referring MD:          Gabriel John (Referring MD) Medicines:             Propofol  per Anesthesia Complications:         No immediate complications. Procedure:             Pre-Anesthesia Assessment:                        - After reviewing the risks and benefits, the patient                         was deemed in satisfactory condition to undergo the                         procedure in an ambulatory setting.                        After obtaining informed consent, the colonoscope was                         passed under direct vision. Throughout the procedure,                         the patient's blood pressure, pulse, and oxygen                          saturations were monitored continuously. The                         Colonoscope was introduced through the anus and                         advanced to the the cecum, identified by the ileocecal                         valve. The colonoscopy was performed without                         difficulty. The patient tolerated the procedure well.                         The quality of the bowel preparation was good. Findings:      The perianal and digital rectal examinations were normal.      Non-bleeding internal hemorrhoids were found during retroflexion. The       hemorrhoids were Grade I (internal hemorrhoids that do not prolapse).      The exam was otherwise  without abnormality. Impression:            - Non-bleeding internal hemorrhoids.                         - The examination was otherwise normal.                        - No specimens collected. Recommendation:        - Discharge patient to home.                        - Resume previous diet.                        - Written discharge instructions were provided to the                         patient. Procedure Code(s):     --- Professional ---                        Z6109, Colorectal cancer screening; colonoscopy on                         individual at high risk Diagnosis Code(s):     --- Professional ---                        K64.0, First degree hemorrhoids                        Z86.010, Personal history of colonic polyps CPT copyright 2022 American Medical Association. All rights reserved. The codes documented in this report are preliminary and upon coder review may  be revised to meet current compliance requirements. Dr. Ward Guy, MD Conrado Delay MD, MD 11/21/2023 9:39:27 AM This report has been signed electronically. Number of Addenda: 0 Note Initiated On: 11/21/2023 9:02 AM Scope Withdrawal Time: 0 hours 8 minutes 26 seconds  Total Procedure Duration: 0 hours 16 minutes 36 seconds  Estimated Blood Loss:  Estimated blood loss: none.      [facility]

## 2023-11-21 NOTE — Transfer of Care (Signed)
 Immediate Anesthesia Transfer of Care Note  Patient: [name]  Procedure(s) Performed: COLONOSCOPY (Rectum)  Patient Location: Endoscopy Unit  Anesthesia Type:General  Level of Consciousness: sedated and drowsy  Airway & Oxygen  Therapy: Patient Spontanous Breathing and Patient connected to face mask oxygen   Post-op Assessment: Report given to RN and Post -op Vital signs reviewed and stable  Post vital signs: Reviewed and stable  Last Vitals:  Vitals Value Taken Time  BP 124/61 11/21/23 0942  Temp 36 C 11/21/23 0941  Pulse 80 11/21/23 0942  Resp 10 11/21/23 0942  SpO2 100 % 11/21/23 0942    Last Pain:  Vitals:   11/21/23 0857  TempSrc: Temporal  PainSc: 2          Complications: No notable events documented.

## 2023-11-21 NOTE — Anesthesia Postprocedure Evaluation (Signed)
 Anesthesia Post Note  Patient: GERHARD FERRIN  Procedure(s) Performed: COLONOSCOPY (Rectum)  Patient location during evaluation: Endoscopy Anesthesia Type: General Level of consciousness: awake and alert Pain management: pain level controlled Vital Signs Assessment: post-procedure vital signs reviewed and stable Respiratory status: spontaneous breathing, nonlabored ventilation, respiratory function stable and patient connected to nasal cannula oxygen  Cardiovascular status: blood pressure returned to baseline and stable Postop Assessment: no apparent nausea or vomiting Anesthetic complications: no   No notable events documented.   Last Vitals:  Vitals:   11/21/23 0951 11/21/23 1001  BP: 138/82   Pulse:  88  Resp: 20 19  Temp:    SpO2: 97% 96%    Last Pain:  Vitals:   11/21/23 1001  TempSrc:   PainSc: 0-No pain                 Nancey Awkward

## 2023-11-21 NOTE — Interval H&P Note (Signed)
 History and Physical Interval Note:  11/21/2023 9:02 AM  [name]  has presented today for surgery, with the diagnosis of Z86.010 Hx of colon polyps.  The various methods of treatment have been discussed with the patient and family. After consideration of risks, benefits and other options for treatment, the patient has consented to  Procedure(s): COLONOSCOPY (N/A) as a surgical intervention.  The patient's history has been reviewed, patient examined, no change in status, stable for surgery.  I have reviewed the patient's chart and labs.  Questions were answered to the patient's satisfaction.     Harleen Fineberg Rosea Conch

## 2023-11-29 DIAGNOSIS — Z79899 Other long term (current) drug therapy: Secondary | ICD-10-CM | POA: Diagnosis not present

## 2023-11-29 DIAGNOSIS — M0579 Rheumatoid arthritis with rheumatoid factor of multiple sites without organ or systems involvement: Secondary | ICD-10-CM | POA: Diagnosis not present

## 2023-12-04 ENCOUNTER — Telehealth: Payer: Self-pay | Admitting: Student

## 2023-12-11 ENCOUNTER — Encounter: Payer: Self-pay | Admitting: Cardiology

## 2023-12-11 ENCOUNTER — Ambulatory Visit: Attending: Cardiology | Admitting: Cardiology

## 2023-12-11 VITALS — BP 122/62 | HR 86 | Ht 70.0 in | Wt 315.1 lb

## 2023-12-11 DIAGNOSIS — I10 Essential (primary) hypertension: Secondary | ICD-10-CM | POA: Diagnosis not present

## 2023-12-11 DIAGNOSIS — R600 Localized edema: Secondary | ICD-10-CM | POA: Diagnosis not present

## 2023-12-11 DIAGNOSIS — I2510 Atherosclerotic heart disease of native coronary artery without angina pectoris: Secondary | ICD-10-CM | POA: Diagnosis not present

## 2023-12-11 NOTE — Patient Instructions (Signed)
 Medication Instructions:  Your Physician recommend you continue on your current medication as directed.    *If you need a refill on your cardiac medications before your next appointment, please call your pharmacy*  Lab Work: No labs ordered today  If you have labs (blood work) drawn today and your tests are completely normal, you will receive your results only by: MyChart Message (if you have MyChart) OR A paper copy in the mail If you have any lab test that is abnormal or we need to change your treatment, we will call you to review the results.  Testing/Procedures: No test ordered today   Follow-Up: At [facility], you and your health needs are our priority.  As part of our continuing mission to provide you with exceptional heart care, our providers are all part of one team.  This team includes your primary Cardiologist (physician) and Advanced Practice Providers or APPs (Physician Assistants and Nurse Practitioners) who all work together to provide you with the care you need, when you need it.  Your next appointment:   6 month(s)  Provider:   You may see Dr. Junnie Olives or one of the following Advanced Practice Providers on your designated Care Team:   Laneta Pintos, NP Gildardo Labrador, PA-C Varney Gentleman, PA-C Cadence Bolton, PA-C Ronald Cockayne, NP Morey Ar, NP    We recommend signing up for the patient portal called "MyChart".  Sign up information is provided on this After Visit Summary.  MyChart is used to connect with patients for Virtual Visits (Telemedicine).  Patients are able to view lab/test results, encounter notes, upcoming appointments, etc.  Non-urgent messages can be sent to your provider as well.   To learn more about what you can do with MyChart, go to [email or web address].

## 2023-12-11 NOTE — Progress Notes (Signed)
 Cardiology Office Note:    Date:  12/11/2023   ID:  [name], DOB [birth year], MRN [id number]  PCP:  Gabriel John, NP   [facility] Providers Cardiologist:  None     Referring MD: Gabriel John, NP   Chief Complaint  Patient presents with   6 week follow up     "Doing well"     History of Present Illness:    [name] is a 73 y.o. male with a hx of CAD (LAD, RCA calcifications on chest CT )hypertension, diabetes, former smoker x 30+ years, COPD, OSA on CPAP who presents for follow-up.  Last seen due to edema, attributed to morbid obesity.  Started on torsemide  with good effect.  Norvasc was also stopped.  States swelling is much improved.  Endorses adequate diuresing.  Blood pressures are adequately controlled.  Overall doing well, no concerns at this time.  Prior notes/testing Echocardiogram 07/31/2023 showed normal systolic function EF 65 to 70%, impaired relaxation.  Past Medical History:  Diagnosis Date   Acute maxillary sinusitis 12/05/2020   Adenomatous polyp    Aortic atherosclerosis (HCC) 06/16/2020   Arthritis    knee   Chronic obstructive lung disease (HCC)    Colon polyp 2008   COPD with emphysema (HCC)    COVID-19 02/14/2022   Diabetes mellitus    Emphysema lung (HCC) 06/16/2020   Emphysema lung (HCC)    Emphysema of lung (HCC) 2016   Hypercholesteremia    Hyperlipemia    Hypertension    Hypothyroidism    Observed sleep apnea    Oxygen  deficiency 2016   Sleep apnea    Statin myopathy     Past Surgical History:  Procedure Laterality Date   COLONOSCOPY  2008, 2016   Dr Marquita Situ   COLONOSCOPY N/A 11/21/2023   Procedure: COLONOSCOPY;  Surgeon: Conrado Delay, DO;  Location: [facility] ENDOSCOPY;  Service: General;  Laterality: N/A;   COLONOSCOPY WITH PROPOFOL  N/A 10/03/2017   Procedure: COLONOSCOPY WITH PROPOFOL ;  Surgeon: Marshall Skeeter, MD;  Location: [facility] ENDOSCOPY;  Service: Endoscopy;  Laterality: N/A;   COLONOSCOPY WITH  PROPOFOL  N/A 10/08/2020   Procedure: COLONOSCOPY WITH PROPOFOL ;  Surgeon: Marshall Skeeter, MD;  Location: [facility] ENDOSCOPY;  Service: Endoscopy;  Laterality: N/A;   DERMATOFIBROMA  03/26/2015   Procedure: Excision mass left leg;  Surgeon: Marshall Skeeter, MD;  Location: [facility] ORS;  Service: General;  Laterality: N/A;   LIPOMA EXCISION N/A 03/26/2015       TONSILLECTOMY      Current Medications: Current Meds  Medication Sig   albuterol  (VENTOLIN  HFA) 108 (90 Base) MCG/ACT inhaler INHALE 2 PUFFS INTO THE LUNGS EVERY 4 HOURS AS NEEDED FOR WHEEZING OR SHORTNESS OF BREATH   aspirin 81 MG EC tablet Take by mouth.   Dulaglutide (TRULICITY) 3 MG/0.5ML SOAJ Inject 3 mg into the skin.   fenofibrate  (TRICOR ) 145 MG tablet Take 1 tablet by mouth daily.   fluticasone  (FLONASE ) 50 MCG/ACT nasal spray Place 1 spray into both nostrils 2 (two) times daily.   folic acid (FOLVITE) 1 MG tablet Take 1 mg by mouth daily.   insulin  degludec (TRESIBA) 200 UNIT/ML FlexTouch Pen Inject 70 Units into the skin daily.   levocetirizine (XYZAL ) 5 MG tablet TAKE ONE TABLET BY MOUTH ONCE EVERY EVENING FOR ALLERGIES   levothyroxine  (SYNTHROID ) 137 MCG tablet Take 137 mcg by mouth daily before breakfast.   metFORMIN (GLUCOPHAGE) 1000 MG tablet TAKE 1 TABLET BY MOUTH  TWICE A DAY   metoprolol succinate (TOPROL-XL) 25 MG 24 hr tablet Take 1 tablet by mouth daily.   NOVOLOG FLEXPEN 100 UNIT/ML FlexPen Inject 28 units before breakfast, inject 24 units beforelunch, inject 34 units before supper. Increase when blood sugar is over 150. Use 2 units more for every 50 over.   ramipril (ALTACE) 10 MG capsule TAKE 1 CAPSULE BY MOUTH 2 TIMES DAILY   rosuvastatin  (CRESTOR ) 10 MG tablet TAKE ONE TABLET BY MOUTH ONCE A DAY FOR CHOLESTEROL.   Spacer/Aero-Holding Chambers (AEROCHAMBER PLUS FLO-VU MEDIUM) MISC Use daily as needed with inhaler   spironolactone  (ALDACTONE ) 25 MG tablet Take 1 tablet (25 mg total) by mouth daily.   torsemide   (DEMADEX ) 20 MG tablet Take 1 tablet (20 mg total) by mouth daily.   TRUEPLUS 5-BEVEL PEN NEEDLES 31G X 6 MM MISC    [DISCONTINUED] hydrochlorothiazide  (HYDRODIURIL ) 50 MG tablet Take 50 mg by mouth daily.     Allergies:   Atorvastatin and Other   Social History   Socioeconomic History   Marital status: Married    Spouse name: Monroe Antigua   Number of children: 3   Years of education: Not on file   Highest education level: Associate degree: occupational, Scientist, product/process development, or vocational program  Occupational History   Occupation: retired  Tobacco Use   Smoking status: Former    Current packs/day: 0.00    Average packs/day: 2.0 packs/day for 35.0 years (70.0 ttl pk-yrs)    Types: Cigarettes, E-cigarettes    Start date: 12/11/1979    Quit date: 12/11/2014    Years since quitting: 9.0   Smokeless tobacco: Never  Vaping Use   Vaping status: Never Used  Substance and Sexual Activity   Alcohol use: No   Drug use: No   Sexual activity: Not Currently    Birth control/protection: None  Other Topics Concern   Not on file  Social History Narrative   Married.   3 children. 2 grandchildren.   Retired. He once worked as a Naval architect.   Enjoys working on projects around [employer].    One son lives next door, another stays with them sometimes   Social Drivers of Corporate investment banker Strain: Low Risk  (10/16/2023)   Received from [facility]   Overall Financial Resource Strain (CARDIA)    Difficulty of Paying Living Expenses: Not hard at all  Food Insecurity: No Food Insecurity (10/16/2023)   Received from [facility]   Hunger Vital Sign    Worried About Running Out of Food in the Last Year: Never true    Ran Out of Food in the Last Year: Never true  Transportation Needs: No Transportation Needs (10/16/2023)   Received from [facility] - [facility]    In the past 12 months, has lack of transportation kept you from medical  appointments or from getting medications?: No    Lack of Transportation (Non-Medical): No  Physical Activity: Insufficiently Active (05/02/2023)   Exercise Vital Sign    Days of Exercise per Week: 1 day    Minutes of Exercise per Session: 20 min  Stress: No Stress Concern Present (05/02/2023)   [employer] of Occupational Health - Occupational Stress Questionnaire    Feeling of Stress : Only a little  Social Connections: Socially Integrated (05/02/2023)   Social Connection and Isolation Panel [NHANES]    Frequency of Communication with Friends and Family: Three times a week  Frequency of Social Gatherings with Friends and Family: More than three times a week    Attends Religious Services: More than 4 times per year    Active Member of [employer] or Organizations: Yes    Attends Engineer, structural: More than 4 times per year    Marital Status: Married     Family History: The patient's family history includes Arthritis in his father; Diabetes in his maternal grandmother, mother, paternal grandmother, and another family member; Early death in his mother; Hypertension in his father; Other (age of onset: 8) in his daughter; Stroke in his father and mother.  ROS:   Please see the history of present illness.     All other systems reviewed and are negative.  EKGs/Labs/Other Studies Reviewed:    The following studies were reviewed today:  EKG Interpretation Date/Time:  Tuesday Dec 11 2023 14:51:12 EDT Ventricular Rate:  86 PR Interval:  166 QRS Duration:  124 QT Interval:  384 QTC Calculation: 459 R Axis:   63  Text Interpretation: Sinus rhythm with occasional Premature ventricular complexes Right bundle branch block Confirmed by Constancia Delton ([id number]) on 12/11/2023 2:59:38 PM    Recent Labs: 05/08/2023: ALT 30 11/09/2023: BUN 16; Creatinine, Ser 1.08; Potassium 4.2; Sodium 141  Recent Lipid Panel    Component Value Date/Time   CHOL 96 05/02/2022 1150   CHOL  151 02/19/2015 0923   TRIG 194.0 (H) 05/02/2022 1150   HDL 27.60 (L) 05/02/2022 1150   HDL 42 02/19/2015 0923   CHOLHDL 3 05/02/2022 1150   VLDL 38.8 05/02/2022 1150   LDLCALC 30 05/02/2022 1150   LDLCALC 82 02/19/2015 0923     Risk Assessment/Calculations:             Physical Exam:    VS:  BP 122/62 (BP Location: Left Arm, Patient Position: Sitting, Cuff Size: Large)   Pulse 86   Ht 5\' 10"  (1.778 m)   Wt (!) 315 lb 2 oz (142.9 kg)   SpO2 98%   BMI 45.22 kg/m     Wt Readings from Last 3 Encounters:  12/11/23 (!) 315 lb 2 oz (142.9 kg)  11/21/23 (!) 306 lb (138.8 kg)  10/30/23 (!) 315 lb (142.9 kg)     GEN:  Well nourished, well developed in no acute distress HEENT: Normal NECK: No JVD; No carotid bruits CARDIAC: RRR, no murmurs, rubs, gallops RESPIRATORY:  Clear to auscultation without rales, wheezing or rhonchi  ABDOMEN: Soft, non-tender, non-distended MUSCULOSKELETAL:  1+ edema; No deformity  SKIN: Warm and dry NEUROLOGIC:  Alert and oriented x 3 PSYCHIATRIC:  Normal affect   ASSESSMENT:    1. Bilateral lower extremity edema   2. Primary hypertension   3. Coronary artery disease involving native coronary artery of native heart, unspecified whether angina present   4. Morbid obesity (HCC)    PLAN:    In order of problems listed above:  Bilateral edema, 1+ on exam.  Echo with normal EF, impaired relaxation.  Endorse adequate diuresing.  Continue torsemide  20 mg daily, Aldactone  25 mg daily.  Check BMP in 10 days.   Hypertension, BP controlled.  Continue torsemide  20 mg daily, Aldactone  25 mg daily, ramipril 10 mg twice daily, Toprol-XL 25 mg daily. CAD, LAD, RCA calcifications.  Continue aspirin, Crestor  10 mg daily. Morbid obesity, low-calorie diet advised.  Plans to follow-up with PCP regarding switch from Trulicity to Ozempic/Mounjaro.  Follow-up in 6 months      Medication Adjustments/Labs and Tests  Ordered: Current medicines are reviewed at length  with the patient today.  Concerns regarding medicines are outlined above.  Orders Placed This Encounter  Procedures   EKG 12-Lead   No orders of the defined types were placed in this encounter.   Patient Instructions  Medication Instructions:  Your Physician recommend you continue on your current medication as directed.    *If you need a refill on your cardiac medications before your next appointment, please call your pharmacy*  Lab Work: No labs ordered today  If you have labs (blood work) drawn today and your tests are completely normal, you will receive your results only by: MyChart Message (if you have MyChart) OR A paper copy in the mail If you have any lab test that is abnormal or we need to change your treatment, we will call you to review the results.  Testing/Procedures: No test ordered today   Follow-Up: At [facility], you and your health needs are our priority.  As part of our continuing mission to provide you with exceptional heart care, our providers are all part of one team.  This team includes your primary Cardiologist (physician) and Advanced Practice Providers or APPs (Physician Assistants and Nurse Practitioners) who all work together to provide you with the care you need, when you need it.  Your next appointment:   6 month(s)  Provider:   You may see Dr. Junnie Olives or one of the following Advanced Practice Providers on your designated Care Team:   Laneta Pintos, NP Gildardo Labrador, PA-C Varney Gentleman, PA-C Cadence Piqua, PA-C Ronald Cockayne, NP Morey Ar, NP    We recommend signing up for the patient portal called "MyChart".  Sign up information is provided on this After Visit Summary.  MyChart is used to connect with patients for Virtual Visits (Telemedicine).  Patients are able to view lab/test results, encounter notes, upcoming appointments, etc.  Non-urgent messages can be sent to your provider as well.   To learn more about what you can  do with MyChart, go to [email or web address].         Signed, Constancia Delton, MD  12/11/2023 4:45 PM    [facility]

## 2023-12-13 ENCOUNTER — Encounter

## 2023-12-18 DIAGNOSIS — R748 Abnormal levels of other serum enzymes: Secondary | ICD-10-CM | POA: Diagnosis not present

## 2023-12-18 DIAGNOSIS — Z794 Long term (current) use of insulin: Secondary | ICD-10-CM | POA: Diagnosis not present

## 2023-12-18 DIAGNOSIS — B351 Tinea unguium: Secondary | ICD-10-CM | POA: Diagnosis not present

## 2023-12-18 DIAGNOSIS — L851 Acquired keratosis [keratoderma] palmaris et plantaris: Secondary | ICD-10-CM | POA: Diagnosis not present

## 2023-12-18 DIAGNOSIS — E1140 Type 2 diabetes mellitus with diabetic neuropathy, unspecified: Secondary | ICD-10-CM | POA: Diagnosis not present

## 2023-12-18 NOTE — Progress Notes (Signed)
 Chief Complaint: Patient returns for diabetic foot care. Some continued paresthesias.   Exam Skin is dry and atrophic with bilateral edema. Diminished hair growth.  All 10 toenails are thick, dystrophic and discolored, brittle with subungual debris. Hyperkeratosis is noted beneath the right 3rd and 5th metatarsal heads.  No ulcerations.       Impression: 1.  Diabetes with onychomycosis.  2.  Hyperkeratosis multiple, right foot.     Plan: Debridement of all 10 toenails in length and thickness sharply using toenail nippers.  Paring of the lesions. Return three months.    TODD ELSIE ROSELLA, DPM

## 2024-01-10 ENCOUNTER — Ambulatory Visit: Admitting: Emergency Medicine

## 2024-01-10 VITALS — Ht 70.0 in | Wt 305.0 lb

## 2024-01-10 DIAGNOSIS — Z0000 Encounter for general adult medical examination without abnormal findings: Secondary | ICD-10-CM

## 2024-01-10 NOTE — Patient Instructions (Signed)
 [name] , Thank you for taking time out of your busy schedule to complete your Annual Wellness Visit with me. I enjoyed our conversation and look forward to speaking with you again next year. I, as well as your care team,  appreciate your ongoing commitment to your health goals. Please review the following plan we discussed and let me know if I can assist you in the future. Your Game plan/ To Do List    Referrals: None   Follow up Visits: Next Medicare AWV with our clinical staff: 01/13/25 @ 8:50 (phone visit)   Have you seen your provider in the last 6 months (3 months if uncontrolled diabetes)? No Next Office Visit with your provider: will be due for physical 05/07/24  Clinician Recommendations: Call to schedule a diabetic eye exam at your earliest convenience. I have included a list of local eye doctors.  Get a pneumonia vaccine at your next OV with your PCP. Aim for 30 minutes of exercise or brisk walking, 6-8 glasses of water, and 5 servings of fruits and vegetables each day.       This is a list of the screening recommended for you and due dates:  Health Maintenance  Topic Date Due   Complete foot exam   07/01/2022   Pneumococcal Vaccine for age over 41 (3 of 3 - PCV20 or PCV21) 09/26/2022   COVID-19 Vaccine (4 - 2024-25 season) 03/18/2023   Yearly kidney health urinalysis for diabetes  05/03/2023   Eye exam for diabetics  06/06/2023   Hemoglobin A1C  09/17/2023   Flu Shot  02/15/2024   Screening for Lung Cancer  07/23/2024   Yearly kidney function blood test for diabetes  11/08/2024   Medicare Annual Wellness Visit  01/09/2025   DTaP/Tdap/Td vaccine (3 - Td or Tdap) 03/31/2025   Colon Cancer Screening  11/21/2026   Hepatitis C Screening  Completed   Zoster (Shingles) Vaccine  Completed   Hepatitis B Vaccine  Aged Out   HPV Vaccine  Aged Out   Meningitis B Vaccine  Aged Out    Advanced directives: (ACP Link)Information on Advanced Care Planning can be found at [employer] Advance Health Care Directives Advance Health Care Directives. http://guzman.[email or web address]/ You may also get these forms at your doctor's office. Advance Care Planning is important because it:  [x]  Makes sure you receive the medical care that is consistent with your values, goals, and preferences  [x]  It provides guidance to your family and loved ones and reduces their decisional burden about whether or not they are making the right decisions based on your wishes.  Follow the link provided in your after visit summary or read over the paperwork we have mailed to you to help you started getting your Advance Directives in place. If you need assistance in completing these, please reach out to us  so that we can help you!  See attachments for Preventive Care and Fall Prevention Tips.   There are several Eye Doctors in your area. Here are a few that usually accept all insurance types:  [facility] [address] [place], [place] [address] Phone: [phone]  [facility] [address] [place], [place] [address] Phone: [phone]  [facility] [address] [place], [place] [address] Phone: [phone]  [facility] [address] [place], [place] [address] Phone: [phone]  [facility] [address] [place], [place] [address] Phone: [phone]  [facility] [address]  [place], [place] [address] Phone: [phone]  Please let us  know if you require a referral for an eye exam appointment. Thank you!   Fall Prevention in the Home, Adult Falls can cause injuries and affect people of all ages. There are many simple things that you can do to make your home safe and to help prevent falls. If you need it, ask for help making these changes. What actions can I take to prevent falls? General information Use good lighting in all rooms. Make sure to: Replace any light bulbs that burn out. Turn on lights if it is dark and use night-lights. Keep items  that you use often in easy-to-reach places. Lower the shelves around your home if needed. Move furniture so that there are clear paths around it. Do not keep throw rugs or other things on the floor that can make you trip. If any of your floors are uneven, fix them. Add color or contrast paint or tape to clearly mark and help you see: Grab bars or handrails. First and last steps of staircases. Where the edge of each step is. If you use a ladder or stepladder: Make sure that it is fully opened. Do not climb a closed ladder. Make sure the sides of the ladder are locked in place. Have someone hold the ladder while you use it. Know where your pets are as you move through your home. What can I do in the bathroom?     Keep the floor dry. Clean up any water that is on the floor right away. Remove soap buildup in the bathtub or shower. Buildup makes bathtubs and showers slippery. Use non-skid mats or decals on the floor of the bathtub or shower. Attach bath mats securely with double-sided, non-slip rug tape. If you need to sit down while you are in the shower, use a non-slip stool. Install grab bars by the toilet and in the bathtub and shower. Do not use towel bars as grab bars. What can I do in the bedroom? Make sure that you have a light by your bed that is easy to reach. Do not use any sheets or blankets on your bed that hang to the floor. Have a firm bench or chair with side arms that you can use for support when you get dressed. What can I do in the kitchen? Clean up any spills right away. If you need to reach something above you, use a sturdy step stool that has a grab bar. Keep electrical cables out of the way. Do not use floor polish or wax that makes floors slippery. What can I do with my stairs? Do not leave anything on the stairs. Make sure that you have a light switch at the top and the bottom of the stairs. Have them installed if you do not have them. Make sure that there are  handrails on both sides of the stairs. Fix handrails that are broken or loose. Make sure that handrails are as long as the staircases. Install non-slip stair treads on all stairs in your home if they do not have carpet. Avoid having throw rugs at the top or bottom of stairs, or secure the rugs with carpet tape to prevent them from moving. Choose a carpet design that does not hide the edge of steps on the stairs. Make sure that carpet is firmly attached to the stairs. Fix any carpet that is loose or worn. What can I do on the outside of my home? Use bright outdoor lighting.  Repair the edges of walkways and driveways and fix any cracks. Clear paths of anything that can make you trip, such as tools or rocks. Add color or contrast paint or tape to clearly mark and help you see high doorway thresholds. Trim any bushes or trees on the main path into your home. Check that handrails are securely fastened and in good repair. Both sides of all steps should have handrails. Install guardrails along the edges of any raised decks or porches. Have leaves, snow, and ice cleared regularly. Use sand, salt, or ice melt on walkways during winter months if you live where there is ice and snow. In the garage, clean up any spills right away, including grease or oil spills. What other actions can I take? Review your medicines with your health care provider. Some medicines can make you confused or feel dizzy. This can increase your chance of falling. Wear closed-toe shoes that fit well and support your feet. Wear shoes that have rubber soles and low heels. Use a cane, walker, scooter, or crutches that help you move around if needed. Talk with your provider about other ways that you can decrease your risk of falls. This may include seeing a physical therapist to learn to do exercises to improve movement and strength. Where to find more information Centers for Disease Control and Prevention, [employer]: TonerPromos.no [employer] on Aging: BaseRingTones.pl National Institute on Aging: BaseRingTones.pl Contact a health care provider if: You are afraid of falling at home. You feel weak, drowsy, or dizzy at home. You fall at home. Get help right away if you: Lose consciousness or have trouble moving after a fall. Have a fall that causes a head injury. These symptoms may be an emergency. Get help right away. Call 911. Do not wait to see if the symptoms will go away. Do not drive yourself to the hospital. This information is not intended to replace advice given to you by your health care provider. Make sure you discuss any questions you have with your health care provider. Document Revised: 03/06/2022 Document Reviewed: 03/06/2022 Elsevier Patient Education  2024 [employer].

## 2024-01-10 NOTE — Progress Notes (Signed)
 Subjective:   [name] is a 73 y.o. who presents for a Medicare Wellness preventive visit.  As a reminder, Annual Wellness Visits don't include a physical exam, and some assessments may be limited, especially if this visit is performed virtually. We may recommend an in-person follow-up visit with your provider if needed.  Visit Complete: Virtual I connected with  Lynwood CHRISTELLA Stager on 01/10/24 by a audio enabled telemedicine application and verified that I am speaking with the correct person using two identifiers.  Patient Location: Home  Provider Location: Home Office  I discussed the limitations of evaluation and management by telemedicine. The patient expressed understanding and agreed to proceed.  Vital Signs: Because this visit was a virtual/telehealth visit, some criteria may be missing or patient reported. Any vitals not documented were not able to be obtained and vitals that have been documented are patient reported.  VideoDeclined- This patient declined Librarian, academic. Therefore the visit was completed with audio only.  Persons Participating in Visit: Patient.  AWV Questionnaire: Yes: Patient Medicare AWV questionnaire was completed by the patient on 01/03/24; I have confirmed that all information answered by patient is correct and no changes since this date.  Cardiac Risk Factors include: advanced age (>48men, >43 women);male gender;hypertension;diabetes mellitus;obesity (BMI >30kg/m2);Other (see comment), Risk factor comments: OSA (cpap)     Objective:    Today's Vitals   01/10/24 0839 01/10/24 0840  Weight: (!) 305 lb (138.3 kg)   Height: 5' 10 (1.778 m)   PainSc:  4    Body mass index is 43.76 kg/m.     01/10/2024    9:00 AM 11/21/2023    8:55 AM 10/16/2022    9:08 AM 10/14/2021    1:32 PM 10/08/2020    7:41 AM 10/23/2019    3:23 PM 10/03/2017    7:46 AM  Advanced Directives  Does Patient Have a Medical Advance Directive? No No No No  No No No   Would patient like information on creating a medical advance directive? Yes (MAU/Ambulatory/Procedural Areas - Information given)  No - Patient declined No - Patient declined  Yes (MAU/Ambulatory/Procedural Areas - Information given)      Data saved with a previous flowsheet row definition    Current Medications (verified) Outpatient Encounter Medications as of 01/10/2024  Medication Sig   albuterol  (VENTOLIN  HFA) 108 (90 Base) MCG/ACT inhaler INHALE 2 PUFFS INTO THE LUNGS EVERY 4 HOURS AS NEEDED FOR WHEEZING OR SHORTNESS OF BREATH   aspirin 81 MG EC tablet Take by mouth.   Continuous Blood Gluc Receiver (FREESTYLE LIBRE 2 READER) DEVI by Does not apply route.   Continuous Blood Gluc Sensor (FREESTYLE LIBRE 2 SENSOR) MISC by Does not apply route.   Dulaglutide (TRULICITY) 3 MG/0.5ML SOAJ Inject 3 mg into the skin.   fenofibrate  (TRICOR ) 145 MG tablet Take 1 tablet by mouth daily.   folic acid (FOLVITE) 1 MG tablet Take 1 mg by mouth daily.   insulin  degludec (TRESIBA) 200 UNIT/ML FlexTouch Pen Inject 70 Units into the skin daily.   levocetirizine (XYZAL ) 5 MG tablet TAKE ONE TABLET BY MOUTH ONCE EVERY EVENING FOR ALLERGIES   levothyroxine  (SYNTHROID ) 137 MCG tablet Take 137 mcg by mouth daily before breakfast.   metFORMIN (GLUCOPHAGE) 1000 MG tablet TAKE 1 TABLET BY MOUTH TWICE A DAY   metoprolol succinate (TOPROL-XL) 25 MG 24 hr tablet Take 1 tablet by mouth daily.   NOVOLOG FLEXPEN 100 UNIT/ML FlexPen Inject 28 units before  breakfast, inject 24 units beforelunch, inject 34 units before supper. Increase when blood sugar is over 150. Use 2 units more for every 50 over.   ramipril (ALTACE) 10 MG capsule TAKE 1 CAPSULE BY MOUTH 2 TIMES DAILY   rosuvastatin  (CRESTOR ) 10 MG tablet TAKE ONE TABLET BY MOUTH ONCE A DAY FOR CHOLESTEROL.   Spacer/Aero-Holding Chambers (AEROCHAMBER PLUS FLO-VU MEDIUM) MISC Use daily as needed with inhaler   spironolactone  (ALDACTONE ) 25 MG tablet Take 1  tablet (25 mg total) by mouth daily.   torsemide  (DEMADEX ) 20 MG tablet Take 1 tablet (20 mg total) by mouth daily.   TRUEPLUS 5-BEVEL PEN NEEDLES 31G X 6 MM MISC    fluticasone  (FLONASE ) 50 MCG/ACT nasal spray Place 1 spray into both nostrils 2 (two) times daily. (Patient not taking: Reported on 01/10/2024)   methotrexate (RHEUMATREX) 2.5 MG tablet Take 2.5 mg by mouth once a week. Caution:Chemotherapy. Protect from light. (Patient not taking: Reported on 01/10/2024)   No facility-administered encounter medications on file as of 01/10/2024.    Allergies (verified) Atorvastatin and Other   History: Past Medical History:  Diagnosis Date   Acute maxillary sinusitis 12/05/2020   Adenomatous polyp    Aortic atherosclerosis (HCC) 06/16/2020   Arthritis    knee   Chronic obstructive lung disease (HCC)    Colon polyp 2008   COPD with emphysema (HCC)    COVID-19 02/14/2022   Diabetes mellitus    Emphysema lung (HCC) 06/16/2020   Emphysema lung (HCC)    Emphysema of lung (HCC) 2016   Hypercholesteremia    Hyperlipemia    Hypertension    Hypothyroidism    Observed sleep apnea    Oxygen  deficiency 2016   Sleep apnea    Statin myopathy    Past Surgical History:  Procedure Laterality Date   COLONOSCOPY  2008, 2016   Dr Dessa   COLONOSCOPY N/A 11/21/2023   Procedure: COLONOSCOPY;  Surgeon: Tye Millet, DO;  Location: [facility];  Service: General;  Laterality: N/A;   COLONOSCOPY WITH PROPOFOL  N/A 10/03/2017   Procedure: COLONOSCOPY WITH PROPOFOL ;  Surgeon: Dessa Reyes ORN, MD;  Location: [facility];  Service: Endoscopy;  Laterality: N/A;   COLONOSCOPY WITH PROPOFOL  N/A 10/08/2020   Procedure: COLONOSCOPY WITH PROPOFOL ;  Surgeon: Dessa Reyes ORN, MD;  Location: [facility] END[facility]COPY;  Service: Endoscopy;  Laterality: N/A;   DERMATOFIBROMA  03/26/2015   Procedure: Excision mass left leg;  Surgeon: Reyes ORN Dessa, MD;  Location: [facility] ORS;  Service: General;  Laterality: N/A;    LIPOMA EXCISION N/A 03/26/2015       TONSILLECTOMY     Family History  Problem Relation Age of Onset   Stroke Mother    Diabetes Mother    Early death Mother    Stroke Father    Arthritis Father    Hypertension Father    Diabetes Other    Other Daughter 25       precancerous colon polyp    Diabetes Maternal Grandmother    Diabetes Paternal Grandmother    Social History   Socioeconomic History   Marital status: Married    Spouse name: Orlean   Number of children: 3   Years of education: Not on file   Highest education level: Associate degree: academic program  Occupational History   Occupation: retired  Tobacco Use   Smoking status: Former    Current packs/day: 0.00    Average packs/day: 2.0 packs/day for 35.0 years (70.0 ttl pk-yrs)  Types: Cigarettes, E-cigarettes    Start date: 12/11/1979    Quit date: 12/11/2014    Years since quitting: 9.0   Smokeless tobacco: Never  Vaping Use   Vaping status: Never Used  Substance and Sexual Activity   Alcohol use: No   Drug use: No   Sexual activity: Not Currently    Birth control/protection: None  Other Topics Concern   Not on file  Social History Narrative   Married.   3 children. 2 grandchildren.   Retired. He once worked as a Naval architect.   Enjoys working on projects around [employer].    One son lives next door, another stays with them sometimes   Social Drivers of Corporate investment banker Strain: Low Risk  (01/10/2024)   Overall Financial Resource Strain (CARDIA)    Difficulty of Paying Living Expenses: Not very hard  Food Insecurity: No Food Insecurity (01/10/2024)   Hunger Vital Sign    Worried About Running Out of Food in the Last Year: Never true    Ran Out of Food in the Last Year: Never true  Transportation Needs: No Transportation Needs (01/10/2024)   PRAPARE - Administrator, Civil Service (Medical): No    Lack of Transportation (Non-Medical): No  Physical Activity: Sufficiently Active  (01/10/2024)   Exercise Vital Sign    Days of Exercise per Week: 5 days    Minutes of Exercise per Session: 30 min  Stress: No Stress Concern Present (01/10/2024)   [employer] of Occupational Health - Occupational Stress Questionnaire    Feeling of Stress: Only a little  Social Connections: Moderately Integrated (01/10/2024)   Social Connection and Isolation Panel    Frequency of Communication with Friends and Family: More than three times a week    Frequency of Social Gatherings with Friends and Family: More than three times a week    Attends Religious Services: Patient declined    Database administrator or Organizations: Yes    Attends Banker Meetings: Patient declined    Marital Status: Married    Tobacco Counseling Counseling given: Not Answered    Clinical Intake:  Pre-visit preparation completed: Yes  Pain : 0-10 Pain Score: 4  Pain Type: Chronic pain Pain Location: Knee Pain Orientation: Left, Right Pain Descriptors / Indicators: Aching     BMI - recorded: 43.76 Nutritional Status: BMI > 30  Obese Nutritional Risks: None Diabetes: Yes CBG done?: No (FBS 79 per patient) Did pt. bring in CBG monitor from home?: No  Lab Results  Component Value Date   HGBA1C 7.4 03/20/2023   HGBA1C 7.4 (H) 05/02/2022   HGBA1C 7.2 01/02/2022     How often do you need to have someone help you when you read instructions, pamphlets, or other written materials from your doctor or pharmacy?: 1 - Never  Interpreter Needed?: No  Information entered by :: Vina Ned, CMA   Activities of Daily Living     01/10/2024    8:44 AM 01/03/2024    2:51 PM  In your present state of health, do you have any difficulty performing the following activities:  Hearing? 0 0  Vision? 0 0  Difficulty concentrating or making decisions? 0 0  Walking or climbing stairs? 1 1  Comment uses cane or rollator   Dressing or bathing? 1 1  Comment wife helps with ADLs   Doing  errands, shopping? 1 1  Comment wife drives to appointments   Preparing Food  and eating ? N N  Using the Toilet? N N  In the past six months, have you accidently leaked urine? CINDERELLA CINDERELLA  Comment wears depend when going out   Do you have problems with loss of bowel control? N N  Managing your Medications? N N  Managing your Finances? N N  Housekeeping or managing your Housekeeping? N N    Patient Care Team: Gretta Comer POUR, NP as PCP - General (Internal Medicine) Damian Therisa HERO, MD as Physician Assistant (Endocrinology) Neill Boas, [place] (Podiatry) Darliss Rogue, MD as Consulting Physician (Cardiology) Defoor, Elsie HERO, PA-C (Rheumatology) Tye Millet, DO as Consulting Physician (General Surgery) Kasa, Kurian, MD as Consulting Physician (Pulmonary Disease)  I have updated your Care Teams any recent Medical Services you may have received from other providers in the past year.     Assessment:   This is a routine wellness examination for Abdurahman.  Hearing/Vision screen Hearing Screening - Comments:: Denies hearing loss Vision Screening - Comments:: Needs DM eye exam, Patient to call and schedule   Goals Addressed             This Visit's Progress    Patient Stated       Lose 50 lbs       Depression Screen     01/10/2024    8:55 AM 05/08/2023    8:15 AM 10/16/2022    9:07 AM 10/14/2021    1:26 PM 10/26/2020   10:12 AM 10/23/2019   10:40 AM 01/30/2018    3:46 PM  PHQ 2/9 Scores  PHQ - 2 Score 0 0 0 0 0 0 0  PHQ- 9 Score 0    2      Fall Risk     01/10/2024    9:04 AM 01/03/2024    2:51 PM 05/08/2023    8:14 AM 10/16/2022    9:00 AM 10/12/2022    7:32 PM  Fall Risk   Falls in the past year? 0 0 1 0 0  Number falls in past yr: 0 0 0 0 0  Injury with Fall? 0 0 0 0 0  Risk for fall due to : Impaired balance/gait;Impaired mobility;Orthopedic patient  Impaired balance/gait;Impaired mobility No Fall Risks   Follow up Falls evaluation completed;Education provided  Falls  evaluation completed Falls prevention discussed;Falls evaluation completed     MEDICARE RISK AT HOME:  Medicare Risk at Home Any stairs in or around the home?: Yes If so, are there any without handrails?: No Home free of loose throw rugs in walkways, pet beds, electrical cords, etc?: Yes Adequate lighting in your home to reduce risk of falls?: Yes Life alert?: No Use of a cane, walker or w/c?: Yes Grab bars in the bathroom?: Yes Shower chair or bench in shower?: Yes Elevated toilet seat or a handicapped toilet?: Yes  TIMED UP AND GO:  Was the test performed?  No  Cognitive Function: 6CIT completed        01/10/2024    9:05 AM 10/16/2022    9:11 AM 10/14/2021    1:28 PM  6CIT Screen  What Year? 0 points 0 points 0 points  What month? 0 points 0 points 0 points  What time? 0 points 0 points 0 points  Count back from 20 0 points 0 points 0 points  Months in reverse 0 points 0 points 0 points  Repeat phrase 2 points 0 points 2 points  Total Score 2 points 0 points 2 points  Immunizations Immunization History  Administered Date(s) Administered   Fluad Quad(high Dose 65+) 04/15/2019, 04/26/2020, 04/27/2021, 05/02/2022   Fluad Trivalent(High Dose 65+) 05/08/2023   Influenza Split 04/26/2007   Influenza,inj,Quad PF,6+ Mos 03/18/2015, 05/10/2017, 04/08/2018   Influenza-Unspecified 03/18/2015, 04/16/2016, 05/10/2017   PFIZER Comirnaty(Gray Top)Covid-19 Tri-Sucrose Vaccine 09/16/2019, 10/07/2019   PFIZER(Purple Top)SARS-COV-2 Vaccination 07/19/2020   Pneumococcal Conjugate-13 09/25/2017   Pneumococcal Polysaccharide-23 06/19/2011, 06/05/2016   Tdap 06/19/2011, 04/01/2015   Zoster Recombinant(Shingrix) 04/15/2019, 06/17/2019    Screening Tests Health Maintenance  Topic Date Due   FOOT EXAM  07/01/2022   Pneumococcal Vaccine: 50+ Years (3 of 3 - PCV20 or PCV21) 09/26/2022   COVID-19 Vaccine (4 - 2024-25 season) 03/18/2023   Diabetic kidney evaluation - Urine ACR   05/03/2023   OPHTHALMOLOGY EXAM  06/06/2023   HEMOGLOBIN A1C  09/17/2023   INFLUENZA VACCINE  02/15/2024   Lung Cancer Screening  07/23/2024   Diabetic kidney evaluation - eGFR measurement  11/08/2024   Medicare Annual Wellness (AWV)  01/09/2025   DTaP/Tdap/Td (3 - Td or Tdap) 03/31/2025   Colonoscopy  11/21/2026   Hepatitis C Screening  Completed   Zoster Vaccines- Shingrix  Completed   Hepatitis B Vaccines  Aged Out   HPV VACCINES  Aged Out   Meningococcal B Vaccine  Aged Out    Health Maintenance  Health Maintenance Due  Topic Date Due   FOOT EXAM  07/01/2022   Pneumococcal Vaccine: 50+ Years (3 of 3 - PCV20 or PCV21) 09/26/2022   COVID-19 Vaccine (4 - 2024-25 season) 03/18/2023   Diabetic kidney evaluation - Urine ACR  05/03/2023   OPHTHALMOLOGY EXAM  06/06/2023   HEMOGLOBIN A1C  09/17/2023   Health Maintenance Items Addressed: See Nurse Notes at the end of this note  Additional Screening:  Vision Screening: Recommended annual ophthalmology exams for early detection of glaucoma and other disorders of the eye. Would you like a referral to an eye doctor? No    Dental Screening: Recommended annual dental exams for proper oral hygiene  Community Resource Referral / Chronic Care Management: CRR required this visit?  No   CCM required this visit?  No   Plan:    I have personally reviewed and noted the following in the patient's chart:   Medical and social history Use of alcohol, tobacco or illicit drugs  Current medications and supplements including opioid prescriptions. Patient is not currently taking opioid prescriptions. Functional ability and status Nutritional status Physical activity Advanced directives List of other physicians Hospitalizations, surgeries, and ER visits in previous 12 months Vitals Screenings to include cognitive, depression, and falls Referrals and appointments  In addition, I have reviewed and discussed with patient certain  preventive protocols, quality metrics, and best practice recommendations. A written personalized care plan for preventive services as well as general preventive health recommendations were provided to patient.   Vina Ned, CMA   01/10/2024   After Visit Summary: (MyChart) Due to this being a telephonic visit, the after visit summary with patients personalized plan was offered to patient via MyChart   Notes:  6 CIT - 2 FBS this morning 79 per patient DM followed by Endocrinology Needs DM eye exam. Patient to call and schedule Needs pneumonia vaccine Declined Covid vaccine

## 2024-01-27 DIAGNOSIS — G4733 Obstructive sleep apnea (adult) (pediatric): Secondary | ICD-10-CM | POA: Diagnosis not present

## 2024-01-27 DIAGNOSIS — J449 Chronic obstructive pulmonary disease, unspecified: Secondary | ICD-10-CM | POA: Diagnosis not present

## 2024-01-28 ENCOUNTER — Telehealth: Payer: Self-pay

## 2024-01-28 NOTE — Telephone Encounter (Signed)
 Copied from CRM [id number]. Topic: General - Other >> Jan 28, 2024  2:07 PM [place] S wrote: Reason for CRM: [facility] is asking for patient to have a 6 minute walk test or citrated sleep study so that patient insurance can approve and renew contract with [facility] in order for patient to receive more order supplies. Phone [phone] Fax [phone]

## 2024-02-01 ENCOUNTER — Telehealth: Payer: Self-pay

## 2024-02-01 NOTE — Telephone Encounter (Signed)
 Copied from CRM [id number]. Topic: Referral - Question >> Feb 01, 2024  9:49 AM Nathanel DEL wrote: Reason for CRM: Mel w/ [facility] would like you to send the home sleep study test that was done 11/14/2017. Please fax to [phone]  C/b [phone]

## 2024-02-01 NOTE — Telephone Encounter (Signed)
 [facility] should already have this sleep study because the patient was setup with [facility] originally by in 2019. I have faxed the sleep study to provided fax # [phone]

## 2024-02-07 DIAGNOSIS — Z79899 Other long term (current) drug therapy: Secondary | ICD-10-CM | POA: Diagnosis not present

## 2024-02-07 DIAGNOSIS — M0579 Rheumatoid arthritis with rheumatoid factor of multiple sites without organ or systems involvement: Secondary | ICD-10-CM | POA: Diagnosis not present

## 2024-02-13 NOTE — Telephone Encounter (Signed)
 Encounter created in error

## 2024-03-14 DIAGNOSIS — E1159 Type 2 diabetes mellitus with other circulatory complications: Secondary | ICD-10-CM | POA: Diagnosis not present

## 2024-03-20 DIAGNOSIS — I10 Essential (primary) hypertension: Secondary | ICD-10-CM | POA: Diagnosis not present

## 2024-03-20 DIAGNOSIS — E039 Hypothyroidism, unspecified: Secondary | ICD-10-CM | POA: Diagnosis not present

## 2024-03-20 DIAGNOSIS — E1159 Type 2 diabetes mellitus with other circulatory complications: Secondary | ICD-10-CM | POA: Diagnosis not present

## 2024-03-20 DIAGNOSIS — E1142 Type 2 diabetes mellitus with diabetic polyneuropathy: Secondary | ICD-10-CM | POA: Diagnosis not present

## 2024-03-20 DIAGNOSIS — E782 Mixed hyperlipidemia: Secondary | ICD-10-CM | POA: Diagnosis not present

## 2024-03-20 DIAGNOSIS — Z1331 Encounter for screening for depression: Secondary | ICD-10-CM | POA: Diagnosis not present

## 2024-03-25 DIAGNOSIS — E1140 Type 2 diabetes mellitus with diabetic neuropathy, unspecified: Secondary | ICD-10-CM | POA: Diagnosis not present

## 2024-03-25 DIAGNOSIS — Z794 Long term (current) use of insulin: Secondary | ICD-10-CM | POA: Diagnosis not present

## 2024-03-25 DIAGNOSIS — B351 Tinea unguium: Secondary | ICD-10-CM | POA: Diagnosis not present

## 2024-03-25 DIAGNOSIS — L851 Acquired keratosis [keratoderma] palmaris et plantaris: Secondary | ICD-10-CM | POA: Diagnosis not present

## 2024-04-01 ENCOUNTER — Encounter: Payer: Self-pay | Admitting: Internal Medicine

## 2024-04-01 ENCOUNTER — Ambulatory Visit: Admitting: Internal Medicine

## 2024-04-01 VITALS — BP 120/80 | HR 92 | Temp 98.3°F | Ht 70.0 in | Wt 299.8 lb

## 2024-04-01 DIAGNOSIS — J9611 Chronic respiratory failure with hypoxia: Secondary | ICD-10-CM

## 2024-04-01 DIAGNOSIS — J449 Chronic obstructive pulmonary disease, unspecified: Secondary | ICD-10-CM | POA: Diagnosis not present

## 2024-04-01 DIAGNOSIS — F17210 Nicotine dependence, cigarettes, uncomplicated: Secondary | ICD-10-CM | POA: Diagnosis not present

## 2024-04-01 DIAGNOSIS — G4733 Obstructive sleep apnea (adult) (pediatric): Secondary | ICD-10-CM

## 2024-04-01 DIAGNOSIS — E669 Obesity, unspecified: Secondary | ICD-10-CM | POA: Diagnosis not present

## 2024-04-01 DIAGNOSIS — Z122 Encounter for screening for malignant neoplasm of respiratory organs: Secondary | ICD-10-CM | POA: Diagnosis not present

## 2024-04-01 DIAGNOSIS — Z9989 Dependence on other enabling machines and devices: Secondary | ICD-10-CM | POA: Diagnosis not present

## 2024-04-01 NOTE — Patient Instructions (Signed)
 Excellent Job A+ GOLD STAR!!  Continue CPAP as prescribed  Patient Instructions Continue to use CPAP every night, minimum of 4-6 hours a night.  Change equipment every 30 days or as directed by DME.  Wash your tubing with warm soap and water daily, hang to dry. Wash humidifier portion weekly. Use bottled, distilled water and change daily   Be aware of reduced alertness and do not drive or operate heavy machinery if experiencing this or drowsiness.  Exercise encouraged, as tolerated. Encouraged proper weight management.  Important to get eight or more hours of sleep  Limiting the use of the computer and television before bedtime.  Decrease naps during the day, so night time sleep will become enhanced.  Limit caffeine, and sleep deprivation.    Avoid Allergens and Irritants Avoid secondhand smoke Avoid SICK contacts Recommend  Masking  when appropriate Recommend Keep up-to-date with vaccinations  Referral for New CPAP machine  GREAT JOB ON WEIGHT LOSS!

## 2024-04-01 NOTE — Progress Notes (Signed)
 SYNOPSIS 73 year old male, former smoker followed for emphysema, chronic respiratory failure and OSA on CPAP. He is a former patient of Dr. Magdaleno and last seen in office 09/09/2020. Past medical history significant for HTN, CAD, allergic rhinitis, GERD, DM, hypothyroid, osteoarthritis, obesity.   TEST/EVENTS:  11/14/2017 HST: AHI 50, SpO2 low 71% 01/08/2018 PFT: FVC 69, FEV1 58, ratio 69, TLC 78, DLCOcor 44. No BD 07/21/2022 LDCT chest: atherosclerosis. Stable mediastinal nodes. Emphysema. Stable pulmonary nodules, up to 4.4 mm. Lung RADS 2. Liver steatosis and possible cirrhosis.   09/09/2020: OV with Dr. Shellia. Uses CPAP with oxygen  1.5 lpm. No issues with mask fit. Feels rested. Not having cough, wheezing, or sputum. Hasn't needed albuterol . Had follow up low dose CT and findings stable. '   CC Follow-up assessment for OSA Follow-up assessment for COPD Follow-up lung cancer screening program   HPI Regarding COPD No exacerbation at this time No evidence of heart failure at this time No evidence or signs of infection at this time No respiratory distress No fevers, chills, nausea, vomiting, diarrhea No evidence of lower extremity edema No evidence hemoptysis      Regarding OSA  Discussed sleep data and reviewed with patient.  Encouraged proper weight management.  Discussed driving precautions and its relationship with hypersomnolence.  Discussed sleep hygiene, and benefits of a fixed sleep waked time.  The importance of getting eight or more hours of sleep discussed with patient.  Discussed limiting the use of the computer and television before bedtime.  Decrease naps during the day, so night time sleep will become enhanced.  Limit caffeine, and sleep deprivation.   Patient uses and benefits from therapy Using CPAP nightly and with naps Pressure setting is comfortable and is sleeping well. current prescription Auto CPAP 10-20 AHI reduced to 1.9  Regarding lung cancer  program CT scan of the chest January 2025  No significant findings Follow-up annually as scheduled      Allergies  Allergen Reactions   Atorvastatin     Other reaction(s): Headache   Other Other (See Comments)    STATIN DRUG-CAUSED MUSCLE PAIN/LETHARGIC    Immunization History  Administered Date(s) Administered   Fluad Quad(high Dose 65+) 04/15/2019, 04/26/2020, 04/27/2021, 05/02/2022   Fluad Trivalent(High Dose 65+) 05/08/2023   Influenza Split 04/26/2007   Influenza,inj,Quad PF,6+ Mos 03/18/2015, 05/10/2017, 04/08/2018   Influenza-Unspecified 03/18/2015, 04/16/2016, 05/10/2017   PFIZER Comirnaty(Gray Top)Covid-19 Tri-Sucrose Vaccine 09/16/2019, 10/07/2019   PFIZER(Purple Top)SARS-COV-2 Vaccination 07/19/2020   Pneumococcal Conjugate-13 09/25/2017   Pneumococcal Polysaccharide-23 06/19/2011, 06/05/2016   Tdap 06/19/2011, 04/01/2015   Zoster Recombinant(Shingrix) 04/15/2019, 06/17/2019    Past Medical History:  Diagnosis Date   Acute maxillary sinusitis 12/05/2020   Adenomatous polyp    Aortic atherosclerosis (HCC) 06/16/2020   Arthritis    knee   Chronic obstructive lung disease (HCC)    Colon polyp 2008   COPD with emphysema (HCC)    COVID-19 02/14/2022   Diabetes mellitus    Emphysema lung (HCC) 06/16/2020   Emphysema lung (HCC)    Emphysema of lung (HCC) 2016   Hypercholesteremia    Hyperlipemia    Hypertension    Hypothyroidism    Observed sleep apnea    Oxygen  deficiency 2016   Sleep apnea    Statin myopathy     Tobacco History: Social History   Tobacco Use  Smoking Status Former   Current packs/day: 0.00   Average packs/day: 2.0 packs/day for 35.0 years (70.0 ttl pk-yrs)   Types:  Cigarettes, E-cigarettes   Start date: 12/11/1979   Quit date: 12/11/2014   Years since quitting: 9.3  Smokeless Tobacco Never   Counseling given: Not Answered   Outpatient Medications Prior to Visit  Medication Sig Dispense Refill   albuterol  (VENTOLIN  HFA) 108  (90 Base) MCG/ACT inhaler INHALE 2 PUFFS INTO THE LUNGS EVERY 4 HOURS AS NEEDED FOR WHEEZING OR SHORTNESS OF BREATH 18 g 0   aspirin 81 MG EC tablet Take by mouth.     Continuous Blood Gluc Receiver (FREESTYLE LIBRE 2 READER) DEVI by Does not apply route.     Continuous Blood Gluc Sensor (FREESTYLE LIBRE 2 SENSOR) MISC by Does not apply route.     Dulaglutide (TRULICITY) 3 MG/0.5ML SOAJ Inject 3 mg into the skin.     fenofibrate  (TRICOR ) 145 MG tablet Take 1 tablet by mouth daily.     fluticasone  (FLONASE ) 50 MCG/ACT nasal spray Place 1 spray into both nostrils 2 (two) times daily. (Patient not taking: Reported on 01/10/2024) 16 g 0   folic acid (FOLVITE) 1 MG tablet Take 1 mg by mouth daily.     insulin  degludec (TRESIBA) 200 UNIT/ML FlexTouch Pen Inject 70 Units into the skin daily.     levocetirizine (XYZAL ) 5 MG tablet TAKE ONE TABLET BY MOUTH ONCE EVERY EVENING FOR ALLERGIES 90 tablet 2   levothyroxine  (SYNTHROID ) 137 MCG tablet Take 137 mcg by mouth daily before breakfast.     metFORMIN (GLUCOPHAGE) 1000 MG tablet TAKE 1 TABLET BY MOUTH TWICE A DAY 180 tablet 1   methotrexate (RHEUMATREX) 2.5 MG tablet Take 2.5 mg by mouth once a week. Caution:Chemotherapy. Protect from light. (Patient not taking: Reported on 01/10/2024)     metoprolol succinate (TOPROL-XL) 25 MG 24 hr tablet Take 1 tablet by mouth daily.     NOVOLOG FLEXPEN 100 UNIT/ML FlexPen Inject 28 units before breakfast, inject 24 units beforelunch, inject 34 units before supper. Increase when blood sugar is over 150. Use 2 units more for every 50 over.  9   ramipril (ALTACE) 10 MG capsule TAKE 1 CAPSULE BY MOUTH 2 TIMES DAILY 180 capsule 1   rosuvastatin  (CRESTOR ) 10 MG tablet TAKE ONE TABLET BY MOUTH ONCE A DAY FOR CHOLESTEROL. 90 tablet 3   Spacer/Aero-Holding Chambers (AEROCHAMBER PLUS FLO-VU MEDIUM) MISC Use daily as needed with inhaler 1 each 0   spironolactone  (ALDACTONE ) 25 MG tablet Take 1 tablet (25 mg total) by mouth daily. 90  tablet 3   torsemide  (DEMADEX ) 20 MG tablet Take 1 tablet (20 mg total) by mouth daily. 120 tablet 3   TRUEPLUS 5-BEVEL PEN NEEDLES 31G X 6 MM MISC      No facility-administered medications prior to visit.    BP 120/80   Pulse 92   Temp 98.3 F (36.8 C)   Ht 5' 10 (1.778 m)   Wt 299 lb 12.8 oz (136 kg)   SpO2 94%   BMI 43.02 kg/m     Review of Systems: Gen:  Denies  fever, sweats, chills weight loss  HEENT: Denies blurred vision, double vision, ear pain, eye pain, hearing loss, nose bleeds, sore throat Cardiac:  No dizziness, chest pain or heaviness, chest tightness,edema, No JVD Resp:   No cough, -sputum production, -shortness of breath,-wheezing, -hemoptysis,  Other:  All other systems negative   Physical Examination:   General Appearance: No distress  EYES PERRLA, EOM intact.   NECK Supple, No JVD Pulmonary: normal breath sounds, No wheezing.  CardiovascularNormal S1,S2.  No m/r/g.   Abdomen: Benign, Soft, non-tender. Neurology UE/LE 5/5 strength, no focal deficits Ext pulses intact, cap refill intact ALL OTHER ROS ARE NEGATIVE   Lab Results:  CBC    Component Value Date/Time   WBC 8.1 02/19/2015 0923   WBC 6.9 01/12/2012 1909   RBC 4.68 02/19/2015 0923   RBC 4.40 01/12/2012 1909   HGB 14.2 02/19/2015 0923   HCT 41.3 02/19/2015 0923   PLT 269 02/19/2015 0923   MCV 88 02/19/2015 0923   MCH 30.3 02/19/2015 0923   MCH 30.9 01/12/2012 1909   MCHC 34.4 02/19/2015 0923   MCHC 34.8 01/12/2012 1909   RDW 13.0 02/19/2015 0923   LYMPHSABS 2.6 02/19/2015 0923   EOSABS 0.2 02/19/2015 0923   BASOSABS 0.0 02/19/2015 0923    BMET    Component Value Date/Time   NA 141 11/09/2023 1403   K 4.2 11/09/2023 1403   CL 102 11/09/2023 1403   CO2 23 11/09/2023 1403   GLUCOSE 64 (L) 11/09/2023 1403   GLUCOSE 108 (H) 05/02/2022 1150   BUN 16 11/09/2023 1403   CREATININE 1.08 11/09/2023 1403   CALCIUM  9.6 11/09/2023 1403   GFRNONAA 76 02/19/2015 0923   GFRAA 88  02/19/2015 0923         Assessment & Plan:  73 year old pleasant white male seen today for follow-up assessment for COPD and severe OSA, follow-up lung cancer screening program, chronic hypoxic resp failure  Assessment of COPD No exacerbation at this time No evidence of heart failure at this time No evidence or signs of infection at this time No respiratory distress No fevers, chills, nausea, vomiting, diarrhea No evidence of lower extremity edema No evidence hemoptysis Avoid Allergens and Irritants Avoid secondhand smoke Avoid SICK contacts Recommend  Masking  when appropriate Recommend Keep up-to-date with vaccinations No indication for maintenance therapy at this time Continue to use albuterol  as needed   Assessment of OSA Previous AHI 50 Continue CPAP as prescribed  Excellent compliance report Reviewed compliance report in detail with patient Patient definitely benefits the use of CPAP therapy as prescribed Using CPAP nightly and with naps Pressure setting is comfortable and is sleeping well. CPAP prescription 10-20 AHI reduced to 1.9 Referral for new CPAP machine  No evidence of acute heart failure at this time No respiratory distress No fevers, chills, nausea, vomiting, diarrhea No evidence hemoptysis  Patient Instructions Continue to use CPAP every night, minimum of 4-6 hours a night.  Change equipment every 30 days or as directed by DME.  Wash your tubing with warm soap and water daily, hang to dry. Wash humidifier portion weekly. Use bottled, distilled water and change daily   Be aware of reduced alertness and do not drive or operate heavy machinery if experiencing this or drowsiness.  Exercise encouraged, as tolerated. Encouraged proper weight management.  Important to get eight or more hours of sleep  Limiting the use of the computer and television before bedtime.  Decrease naps during the day, so night time sleep will become enhanced.  Limit  caffeine, and sleep deprivation.  HTN, stroke, uncontrolled diabetes and heart failure are potential risk factors.  Risk of untreated sleep apnea including cardiac arrhthymias, stroke, DM, pulm HTN.    Extensive smoking history 70-pack-year Lung cancer screening program low-dose CT chest January 2025  no significant findings Follow-up annually  Chronic Hypoxic resp failure due to COPD -Patient benefits from oxygen  therapy 1.5L [place]  -recommend using oxygen  as prescribed -patient needs this for  survival Continue supplemental oxygen  1.5 lpm bled through CPAP nightly - Goal O2 >88-90%  Obesity -recommend significant weight loss -recommend changing diet  Deconditioned state -Recommend increased daily activity and exercise    MEDICATION ADJUSTMENTS/LABS AND TESTS ORDERED: Avoid Allergens and Irritants Avoid secondhand smoke Avoid SICK contacts Recommend  Masking  when appropriate Recommend Keep up-to-date with vaccinations  Continue albuterol  as needed Continue oxygen  as prescribed Follow-up lung cancer screening Continue CPAP as prescribed Referral for new CPAP machine  CURRENT MEDICATIONS REVIEWED AT LENGTH WITH PATIENT TODAY   Patient  satisfied with Plan of action and management. All questions answered   Follow up 6 months   I spent a total of 44 minutes dedicated to the care of this patient on the date of this encounter to include pre-visit review of records, face-to-face time with the patient discussing conditions above, post visit ordering of testing, clinical documentation with the electronic health record, making appropriate referrals as documented, and communicating necessary information to the patient's healthcare team.    The Patient requires high complexity decision making for assessment and support, frequent evaluation and titration of therapies, application of advanced monitoring technologies and extensive interpretation of multiple databases.  Patient  satisfied with Plan of action and management. All questions answered    Nickolas Alm Cellar, M.D.  [facility]  Medical Director [facility] Medical Director [facility] Cardio-Pulmonary Department

## 2024-04-02 ENCOUNTER — Other Ambulatory Visit: Payer: Self-pay | Admitting: Primary Care

## 2024-04-02 DIAGNOSIS — J309 Allergic rhinitis, unspecified: Secondary | ICD-10-CM

## 2024-04-03 ENCOUNTER — Telehealth: Payer: Self-pay

## 2024-04-03 NOTE — Telephone Encounter (Signed)
 Copied from CRM [id number]. Topic: Clinical - Request for Lab/Test Order >> Apr 03, 2024  3:57 PM Devaughn RAMAN wrote: Reason for CRM: Lily with [employer] [facility] called stating the patient needs a titrate sleep study not overnight post ox symmetry Arleene stated per insurance guidelines the testing that is needed is the titrate sleep study.  [phone] [phone]

## 2024-04-04 ENCOUNTER — Other Ambulatory Visit: Payer: Self-pay | Admitting: Internal Medicine

## 2024-04-04 DIAGNOSIS — G4733 Obstructive sleep apnea (adult) (pediatric): Secondary | ICD-10-CM

## 2024-04-04 NOTE — Telephone Encounter (Signed)
 I have sent urgent message to [employer] about this issue to verify if still Cpap Titration is still needed if we are just trying to see if still needs his 02 at night

## 2024-04-04 NOTE — Progress Notes (Signed)
 CPAP titration ordered as requested by DME

## 2024-04-08 NOTE — Telephone Encounter (Signed)
 I received a message from [place] with [employer] [facility]   They have left VM on 04/02/2024 and 04/07/2024 [facility]   If pt is qualifying for O2 then yes, but just to check no

## 2024-04-10 DIAGNOSIS — R0902 Hypoxemia: Secondary | ICD-10-CM | POA: Diagnosis not present

## 2024-04-10 DIAGNOSIS — G4730 Sleep apnea, unspecified: Secondary | ICD-10-CM | POA: Diagnosis not present

## 2024-04-11 ENCOUNTER — Encounter: Payer: Self-pay | Admitting: Internal Medicine

## 2024-04-14 ENCOUNTER — Other Ambulatory Visit: Payer: Self-pay

## 2024-04-14 ENCOUNTER — Ambulatory Visit: Payer: Self-pay | Admitting: Internal Medicine

## 2024-04-14 DIAGNOSIS — J9611 Chronic respiratory failure with hypoxia: Secondary | ICD-10-CM

## 2024-04-14 DIAGNOSIS — G4733 Obstructive sleep apnea (adult) (pediatric): Secondary | ICD-10-CM

## 2024-04-14 DIAGNOSIS — G4734 Idiopathic sleep related nonobstructive alveolar hypoventilation: Secondary | ICD-10-CM

## 2024-04-18 ENCOUNTER — Encounter: Payer: Self-pay | Admitting: Internal Medicine

## 2024-04-18 DIAGNOSIS — G4733 Obstructive sleep apnea (adult) (pediatric): Secondary | ICD-10-CM | POA: Diagnosis not present

## 2024-04-21 NOTE — Addendum Note (Signed)
 Addended by: Trinika Cortese J on: 04/21/2024 08:04 AM   Modules accepted: Orders

## 2024-04-21 NOTE — Telephone Encounter (Signed)
 Copied from CRM #[id number]. Topic: Clinical - Order For Equipment >> Apr 21, 2024  8:23 AM [name] wrote: Reason for CRM: pt states w/ his new cpap there is no tubing to connect his O2. Would like to know if this will be included. He said there is a specific hose for this machine. Pt states either phone number will be OK to c/b.

## 2024-04-22 ENCOUNTER — Telehealth: Payer: Self-pay | Admitting: Internal Medicine

## 2024-04-22 NOTE — Telephone Encounter (Signed)
 We received message from [place] with [employer] [facility]   We will get a connector out to the pt

## 2024-04-22 NOTE — Telephone Encounter (Signed)
 Order has been resent to [employer]

## 2024-04-22 NOTE — Telephone Encounter (Signed)
 Copied from CRM [id number]. Topic: Clinical - Order For Equipment >> Apr 21, 2024 10:40 AM Rilla NOVAK wrote: Reason for CRM:  [employer] [facility] calling.  Would like to request a copy of patients most recent sleep study.  Patient was seen on 04/01/2024.  Please include face to face notes.  FAX: [phone].

## 2024-04-23 NOTE — Telephone Encounter (Signed)
 Completed.

## 2024-05-08 ENCOUNTER — Ambulatory Visit: Admitting: Primary Care

## 2024-05-08 ENCOUNTER — Encounter: Payer: Self-pay | Admitting: Primary Care

## 2024-05-08 ENCOUNTER — Ambulatory Visit: Payer: Self-pay | Admitting: Primary Care

## 2024-05-08 VITALS — BP 150/76 | HR 97 | Temp 97.9°F | Ht 70.0 in | Wt 303.0 lb

## 2024-05-08 DIAGNOSIS — I10 Essential (primary) hypertension: Secondary | ICD-10-CM | POA: Diagnosis not present

## 2024-05-08 DIAGNOSIS — J449 Chronic obstructive pulmonary disease, unspecified: Secondary | ICD-10-CM

## 2024-05-08 DIAGNOSIS — Z794 Long term (current) use of insulin: Secondary | ICD-10-CM

## 2024-05-08 DIAGNOSIS — G4733 Obstructive sleep apnea (adult) (pediatric): Secondary | ICD-10-CM | POA: Diagnosis not present

## 2024-05-08 DIAGNOSIS — Z125 Encounter for screening for malignant neoplasm of prostate: Secondary | ICD-10-CM

## 2024-05-08 DIAGNOSIS — Z0000 Encounter for general adult medical examination without abnormal findings: Secondary | ICD-10-CM | POA: Diagnosis not present

## 2024-05-08 DIAGNOSIS — Z23 Encounter for immunization: Secondary | ICD-10-CM | POA: Diagnosis not present

## 2024-05-08 DIAGNOSIS — E039 Hypothyroidism, unspecified: Secondary | ICD-10-CM

## 2024-05-08 DIAGNOSIS — I2510 Atherosclerotic heart disease of native coronary artery without angina pectoris: Secondary | ICD-10-CM | POA: Diagnosis not present

## 2024-05-08 DIAGNOSIS — E1165 Type 2 diabetes mellitus with hyperglycemia: Secondary | ICD-10-CM

## 2024-05-08 DIAGNOSIS — E7800 Pure hypercholesterolemia, unspecified: Secondary | ICD-10-CM

## 2024-05-08 DIAGNOSIS — R600 Localized edema: Secondary | ICD-10-CM | POA: Diagnosis not present

## 2024-05-08 LAB — PSA, MEDICARE: PSA: 1.29 ng/mL (ref 0.10–4.00)

## 2024-05-08 NOTE — Assessment & Plan Note (Addendum)
 Following with endocrinology. Reviewed office notes from September 2025.  Continue levothyroxine  137 mcg daily.

## 2024-05-08 NOTE — Assessment & Plan Note (Signed)
 Above goal today, home readings are at goal.  Continue metoprolol succinate 25 mg daily, ramipril 10 mg twice daily, spironolactone  25 mg daily.

## 2024-05-08 NOTE — Assessment & Plan Note (Signed)
 Asymptomatic.  Following with cardiology, office notes reviewed from May 2025. Continue diabetes control, lipid control, blood pressure control. Continue aspirin 81 mg daily

## 2024-05-08 NOTE — Patient Instructions (Signed)
 Stop by the lab prior to leaving today. I will notify you of your results once received.   It was a pleasure to see you today!

## 2024-05-08 NOTE — Assessment & Plan Note (Signed)
 Deteriorated compared to last reading with A1c of 8.0 in August 2025. Following with endocrinology, office notes reviewed from September 2025.  A1c reviewed from August 2025.  Continue Tresiba 70 units daily, metformin 1000 mg twice daily, Mounjaro 5 mg weekly, NovoLog 34, 24, 34 units with meals.

## 2024-05-08 NOTE — Assessment & Plan Note (Signed)
 Immunizations UTD. Influenza vaccine provided today.  Colonoscopy UTD, due 2028 PSA due and pending.  Discussed the importance of a healthy diet and regular exercise in order for weight loss, and to reduce the risk of further co-morbidity.  Exam stable. Labs pending.  Follow up in 1 year for repeat physical.

## 2024-05-08 NOTE — Assessment & Plan Note (Signed)
 Stable. Following with pulmonology, office notes reviewed from September 2025. Continue albuterol  inhaler as needed.  Lung cancer screening up-to-date

## 2024-05-08 NOTE — Assessment & Plan Note (Signed)
 Following with pulmonology, office notes reviewed from September 2025.  Continue CPAP machine nightly.

## 2024-05-08 NOTE — Assessment & Plan Note (Signed)
 Controlled. Reviewed lipid panel from August 2025 through [facility].  Continue rosuvastatin  10 mg daily.

## 2024-05-08 NOTE — Assessment & Plan Note (Signed)
 Follow with cardiology, office notes reviewed from May 2025.  Continue torsemide  20 mg daily, spironolactone  25 mg daily.

## 2024-05-08 NOTE — Progress Notes (Signed)
 Subjective:    Patient ID: [name], male    DOB: Nov 10, 1950, 73 y.o.   MRN: [id number]  [name] is a very pleasant 73 y.o. male who presents today for complete physical and follow up of chronic conditions.  Immunizations: -Tetanus: Completed in 2016 -Influenza: Influenza vaccine provided today.  -Shingles: Completed Shingrix series -Pneumonia: Completed Prevnar 13 in 2019, Pneumovax 2020 2017  Diet: Fair diet.  Exercise: No regular exercise.  Eye exam: Completed 2 years ago Dental exam: Completed years  ago  Colonoscopy: Completed in 2025, due 2028 Lung Cancer Screening: Completed in January 2025  PSA: Due  BP Readings from Last 3 Encounters:  05/08/24 (!) 150/76  04/01/24 120/80  12/11/23 122/62   He checks his BP at home which runs 130/70.  Wt Readings from Last 3 Encounters:  05/08/24 (!) 303 lb (137.4 kg)  04/01/24 299 lb 12.8 oz (136 kg)  01/10/24 (!) 305 lb (138.3 kg)       Review of Systems  Constitutional:  Negative for unexpected weight change.  HENT:  Negative for rhinorrhea.   Respiratory:  Negative for cough and shortness of breath.   Cardiovascular:  Negative for chest pain.  Gastrointestinal:  Negative for constipation and diarrhea.  Genitourinary:  Negative for difficulty urinating.  Musculoskeletal:  Positive for arthralgias.  Skin:  Negative for rash.  Allergic/Immunologic: Negative for environmental allergies.  Neurological:  Negative for dizziness and headaches.  Psychiatric/Behavioral:  The patient is not nervous/anxious.          Past Medical History:  Diagnosis Date   Acute maxillary sinusitis 12/05/2020   Adenomatous polyp    Aortic atherosclerosis 06/16/2020   Arthritis    knee   Chronic obstructive lung disease (HCC)    Colon polyp 2008   COPD with emphysema (HCC)    COVID-19 02/14/2022   Diabetes mellitus    Emphysema lung (HCC) 06/16/2020   Emphysema lung (HCC)    Emphysema of lung (HCC) 2016    Hypercholesteremia    Hyperlipemia    Hypertension    Hypothyroidism    Observed sleep apnea    Oxygen  deficiency 2016   Sleep apnea    Statin myopathy     Social History   Socioeconomic History   Marital status: Married    Spouse name: Orlean   Number of children: 3   Years of education: Not on file   Highest education level: Associate degree: occupational, Scientist, product/process development, or vocational program  Occupational History   Occupation: retired  Tobacco Use   Smoking status: Former    Current packs/day: 0.00    Average packs/day: 2.0 packs/day for 35.0 years (70.0 ttl pk-yrs)    Types: Cigarettes, E-cigarettes    Start date: 12/11/1979    Quit date: 12/11/2014    Years since quitting: 9.4   Smokeless tobacco: Never  Vaping Use   Vaping status: Never Used  Substance and Sexual Activity   Alcohol use: No   Drug use: No   Sexual activity: Not Currently    Birth control/protection: None  Other Topics Concern   Not on file  Social History Narrative   Married.   3 children. 2 grandchildren.   Retired. He once worked as a Naval architect.   Enjoys working on projects around [employer].    One son lives next door, another stays with them sometimes   Social Drivers of Health   Financial Resource Strain: Low Risk  (05/04/2024)   Overall Financial  Resource Strain (CARDIA)    Difficulty of Paying Living Expenses: Not hard at all  Food Insecurity: No Food Insecurity (05/04/2024)   Hunger Vital Sign    Worried About Running Out of Food in the Last Year: Never true    Ran Out of Food in the Last Year: Never true  Transportation Needs: No Transportation Needs (05/04/2024)   PRAPARE - Administrator, Civil Service (Medical): No    Lack of Transportation (Non-Medical): No  Physical Activity: Insufficiently Active (05/04/2024)   Exercise Vital Sign    Days of Exercise per Week: 4 days    Minutes of Exercise per Session: 30 min  Stress: No Stress Concern Present (05/04/2024)    [employer] of Occupational Health - Occupational Stress Questionnaire    Feeling of Stress: Not at all  Social Connections: Unknown (05/04/2024)   Social Connection and Isolation Panel    Frequency of Communication with Friends and Family: More than three times a week    Frequency of Social Gatherings with Friends and Family: Twice a week    Attends Religious Services: Not on file    Active Member of Clubs or Organizations: No    Attends Banker Meetings: Not on file    Marital Status: Married  Intimate Partner Violence: Not At Risk (01/10/2024)   Humiliation, Afraid, Rape, and Kick questionnaire    Fear of Current or Ex-Partner: No    Emotionally Abused: No    Physically Abused: No    Sexually Abused: No    Past Surgical History:  Procedure Laterality Date   COLONOSCOPY  2008, 2016   Dr Dessa   COLONOSCOPY N/A 11/21/2023   Procedure: COLONOSCOPY;  Surgeon: Tye Millet, DO;  Location: [facility];  Service: General;  Laterality: N/A;   COLONOSCOPY WITH PROPOFOL  N/A 10/03/2017   Procedure: COLONOSCOPY WITH PROPOFOL ;  Surgeon: Dessa Reyes ORN, MD;  Location: [facility];  Service: Endoscopy;  Laterality: N/A;   COLONOSCOPY WITH PROPOFOL  N/A 10/08/2020   Procedure: COLONOSCOPY WITH PROPOFOL ;  Surgeon: Dessa Reyes ORN, MD;  Location: [facility];  Service: Endoscopy;  Laterality: N/A;   DERMATOFIBROMA  03/26/2015   Procedure: Excision mass left leg;  Surgeon: Reyes ORN Dessa, MD;  Location: [facility];  Service: General;  Laterality: N/A;   LIPOMA EXCISION N/A 03/26/2015       TONSILLECTOMY      Family History  Problem Relation Age of Onset   Stroke Mother    Diabetes Mother    Early death Mother    Stroke Father    Arthritis Father    Hypertension Father    Diabetes Other    Other Daughter 25       precancerous colon polyp    Diabetes Maternal Grandmother    Diabetes Paternal Grandmother     Allergies  Allergen Reactions    Atorvastatin     Other reaction(s): Headache   Other Other (See Comments)    STATIN DRUG-CAUSED MUSCLE PAIN/LETHARGIC    Current Outpatient Medications on File Prior to Visit  Medication Sig Dispense Refill   albuterol  (VENTOLIN  HFA) 108 (90 Base) MCG/ACT inhaler INHALE 2 PUFFS INTO THE LUNGS EVERY 4 HOURS AS NEEDED FOR WHEEZING OR SHORTNESS OF BREATH 18 g 0   aspirin 81 MG EC tablet Take by mouth.     Continuous Blood Gluc Receiver (FREESTYLE LIBRE 2 READER) DEVI by Does not apply route.     Continuous Blood Gluc Sensor (FREESTYLE LIBRE 2 SENSOR) MISC  by Does not apply route.     ENBREL SURECLICK 50 MG/ML injection Inject 50 mg into the skin once a week.     fenofibrate  (TRICOR ) 145 MG tablet Take 1 tablet by mouth daily.     fluticasone  (FLONASE ) 50 MCG/ACT nasal spray Place 1 spray into both nostrils 2 (two) times daily. 16 g 0   folic acid (FOLVITE) 1 MG tablet Take 1 mg by mouth daily.     insulin  degludec (TRESIBA) 200 UNIT/ML FlexTouch Pen Inject 70 Units into the skin daily.     levocetirizine (XYZAL ) 5 MG tablet TAKE ONE TABLET BY MOUTH ONCE EVERY EVENING FOR ALLERGIES 90 tablet 0   levothyroxine  (SYNTHROID ) 137 MCG tablet Take 137 mcg by mouth daily before breakfast.     metFORMIN (GLUCOPHAGE) 1000 MG tablet TAKE 1 TABLET BY MOUTH TWICE A DAY 180 tablet 1   metoprolol succinate (TOPROL-XL) 25 MG 24 hr tablet Take 1 tablet by mouth daily.     NOVOLOG FLEXPEN 100 UNIT/ML FlexPen Inject 28 units before breakfast, inject 24 units beforelunch, inject 34 units before supper. Increase when blood sugar is over 150. Use 2 units more for every 50 over.  9   ramipril (ALTACE) 10 MG capsule TAKE 1 CAPSULE BY MOUTH 2 TIMES DAILY 180 capsule 1   rosuvastatin  (CRESTOR ) 10 MG tablet TAKE ONE TABLET BY MOUTH ONCE A DAY FOR CHOLESTEROL. 90 tablet 3   Spacer/Aero-Holding Chambers (AEROCHAMBER PLUS FLO-VU MEDIUM) MISC Use daily as needed with inhaler 1 each 0   spironolactone  (ALDACTONE ) 25 MG tablet  Take 1 tablet (25 mg total) by mouth daily. 90 tablet 3   tirzepatide (MOUNJARO) 5 MG/0.5ML Pen Inject 5 mg into the skin once a week.     torsemide  (DEMADEX ) 20 MG tablet Take 1 tablet (20 mg total) by mouth daily. 120 tablet 3   TRUEPLUS 5-BEVEL PEN NEEDLES 31G X 6 MM MISC      methotrexate (RHEUMATREX) 2.5 MG tablet Take 2.5 mg by mouth once a week. Caution:Chemotherapy. Protect from light. (Patient not taking: Reported on 05/08/2024)     No current facility-administered medications on file prior to visit.    BP (!) 150/76   Pulse 97   Temp 97.9 F (36.6 C) (Temporal)   Ht 5' 10 (1.778 m)   Wt (!) 303 lb (137.4 kg)   SpO2 98%   BMI 43.48 kg/m  Objective:   Physical Exam HENT:     Right Ear: Tympanic membrane and ear canal normal.     Left Ear: Tympanic membrane and ear canal normal.  Eyes:     Pupils: Pupils are equal, round, and reactive to light.  Cardiovascular:     Rate and Rhythm: Normal rate and regular rhythm.  Pulmonary:     Effort: Pulmonary effort is normal.     Breath sounds: Normal breath sounds.  Abdominal:     General: Bowel sounds are normal.     Palpations: Abdomen is soft.     Tenderness: There is no abdominal tenderness.  Musculoskeletal:     Cervical back: Neck supple.     Comments: Ambulating with walker without difficulty  Skin:    General: Skin is warm and dry.  Neurological:     Mental Status: He is alert and oriented to person, place, and time.     Cranial Nerves: No cranial nerve deficit.     Deep Tendon Reflexes:     Reflex Scores:      Patellar  reflexes are 2+ on the right side and 2+ on the left side. Psychiatric:        Mood and Affect: Mood normal.     Physical Exam        Assessment & Plan:  Screening for prostate cancer -     PSA, Medicare  Coronary artery disease involving native coronary artery of native heart without angina pectoris Assessment & Plan: Asymptomatic.  Following with cardiology, office notes reviewed  from May 2025. Continue diabetes control, lipid control, blood pressure control. Continue aspirin 81 mg daily   OSA on CPAP Assessment & Plan: Following with pulmonology, office notes reviewed from September 2025.  Continue CPAP machine nightly.   Chronic obstructive pulmonary disease, unspecified COPD type (HCC) Assessment & Plan: Stable. Following with pulmonology, office notes reviewed from September 2025. Continue albuterol  inhaler as needed.  Lung cancer screening up-to-date   Hypothyroidism, unspecified type Assessment & Plan: Following with endocrinology. Reviewed office notes from September 2025.  Continue levothyroxine  137 mcg daily.   Type 2 diabetes mellitus with hyperglycemia, with long-term current use of insulin  [facility]) Assessment & Plan: Deteriorated compared to last reading with A1c of 8.0 in August 2025. Following with endocrinology, office notes reviewed from September 2025.  A1c reviewed from August 2025.  Continue Tresiba 70 units daily, metformin 1000 mg twice daily, Mounjaro 5 mg weekly, NovoLog 34, 24, 34 units with meals.   Essential (primary) hypertension Assessment & Plan: Above goal today, home readings are at goal.  Continue metoprolol succinate 25 mg daily, ramipril 10 mg twice daily, spironolactone  25 mg daily.   Hypercholesterolemia Assessment & Plan: Controlled. Reviewed lipid panel from August 2025 through Care Everywhere.  Continue rosuvastatin  10 mg daily.   Preventative health care Assessment & Plan: Immunizations UTD. Influenza vaccine provided today.  Colonoscopy UTD, due 2028 PSA due and pending.  Discussed the importance of a healthy diet and regular exercise in order for weight loss, and to reduce the risk of further co-morbidity.  Exam stable. Labs pending.  Follow up in 1 year for repeat physical.    Bilateral lower extremity edema Assessment & Plan: Follow with cardiology, office notes reviewed from May  2025.  Continue torsemide  20 mg daily, spironolactone  25 mg daily.     Assessment and Plan Assessment & Plan         Comer MARLA Gaskins, NP     History of Present Illness

## 2024-05-13 DIAGNOSIS — M0579 Rheumatoid arthritis with rheumatoid factor of multiple sites without organ or systems involvement: Secondary | ICD-10-CM | POA: Diagnosis not present

## 2024-05-13 DIAGNOSIS — Z79899 Other long term (current) drug therapy: Secondary | ICD-10-CM | POA: Diagnosis not present

## 2024-05-15 ENCOUNTER — Other Ambulatory Visit: Payer: Self-pay | Admitting: Primary Care

## 2024-05-15 DIAGNOSIS — E7800 Pure hypercholesterolemia, unspecified: Secondary | ICD-10-CM

## 2024-05-15 DIAGNOSIS — I2510 Atherosclerotic heart disease of native coronary artery without angina pectoris: Secondary | ICD-10-CM

## 2024-05-19 DIAGNOSIS — G4733 Obstructive sleep apnea (adult) (pediatric): Secondary | ICD-10-CM | POA: Diagnosis not present

## 2024-05-29 ENCOUNTER — Telehealth: Admitting: Primary Care

## 2024-05-29 ENCOUNTER — Encounter: Payer: Self-pay | Admitting: Primary Care

## 2024-05-29 VITALS — BP 154/83 | HR 89 | Ht 70.0 in | Wt 303.0 lb

## 2024-05-29 DIAGNOSIS — J0110 Acute frontal sinusitis, unspecified: Secondary | ICD-10-CM | POA: Diagnosis not present

## 2024-05-29 MED ORDER — AMOXICILLIN-POT CLAVULANATE 875-125 MG PO TABS: 1.0000 | ORAL_TABLET | Freq: Two times a day (BID) | ORAL | 0 refills | Status: DC

## 2024-05-29 NOTE — Assessment & Plan Note (Signed)
 HPI suggestive.  Given duration of symptoms, coupled with lack of improvement, will treat for presumed bacterial involvement.  Start Augmentin  antibiotics. Take 1 tablet by mouth twice daily for 10 days. Nasal Congestion/Ear Pressure/Sinus Pressure: Try using Flonase  (fluticasone ) nasal spray. Instill 1 spray in each nostril twice daily.   Follow-up as needed.

## 2024-05-29 NOTE — Patient Instructions (Signed)
 Start Augmentin  antibiotics. Take 1 tablet by mouth twice daily for 10 days.   Nasal Congestion/Ear Pressure/Sinus Pressure: Try using Flonase  (fluticasone ) nasal spray. Instill 1 spray in each nostril twice daily.   It was a pleasure to see you today!

## 2024-05-29 NOTE — Progress Notes (Signed)
 Patient ID: [name], male    DOB: Oct 24, 1950, 73 y.o.   MRN: [id number]  Virtual visit completed through caregility, a video enabled telemedicine application. Due to national recommendations of social distancing due to COVID-19, a virtual visit is felt to be most appropriate for this patient at this time. Reviewed limitations, risks, security and privacy concerns of performing a virtual visit and the availability of in person appointments. I also reviewed that there may be a patient responsible charge related to this service. The patient agreed to proceed.   Patient location: home Provider location: [place] at [facility], office Persons participating in this virtual visit: patient, provider   If any vitals were documented, they were collected by patient at home unless specified below.    BP (!) 154/83   Pulse 89   Ht 5' 10 (1.778 m)   Wt (!) 303 lb (137.4 kg)   BMI 43.48 kg/m    CC: Sinus pressure Subjective:   HPI: [name] is a 73 y.o. male with a history of sinusitis, CAD, hypertension, OSA, type 2 diabetes, hypothyroidism, osteoarthritis presenting on 05/29/2024 for Sinus Problem (C/o nasal congestion and prod cough- greenish/yellow phlegm. Sxs started 05/23/24. Tried Coricidin- helpful. )  Symptom onset about 2 weeks ago with frontal and maxillary sinus pressure. He's also noticed post nasal drip with a cough. He's been taking Coricidin without improvement. He's pulling green and yellow mucous from his nasal cavity.   He denies fevers, chills. He's not feeling improved.       Relevant past medical, surgical, family and social history reviewed and updated as indicated. Interim medical history since our last visit reviewed. Allergies and medications reviewed and updated. Outpatient Medications Prior to Visit  Medication Sig Dispense Refill   albuterol  (VENTOLIN  HFA) 108 (90 Base) MCG/ACT inhaler INHALE 2 PUFFS INTO THE LUNGS EVERY 4 HOURS AS NEEDED FOR WHEEZING OR  SHORTNESS OF BREATH 18 g 0   aspirin 81 MG EC tablet Take by mouth.     Continuous Blood Gluc Receiver (FREESTYLE LIBRE 2 READER) DEVI by Does not apply route.     Continuous Blood Gluc Sensor (FREESTYLE LIBRE 2 SENSOR) MISC by Does not apply route.     ENBREL SURECLICK 50 MG/ML injection Inject 50 mg into the skin once a week.     fenofibrate  (TRICOR ) 145 MG tablet Take 1 tablet by mouth daily.     fluticasone  (FLONASE ) 50 MCG/ACT nasal spray Place 1 spray into both nostrils 2 (two) times daily. 16 g 0   folic acid (FOLVITE) 1 MG tablet Take 1 mg by mouth daily.     insulin  degludec (TRESIBA) 200 UNIT/ML FlexTouch Pen Inject 70 Units into the skin daily.     levocetirizine (XYZAL ) 5 MG tablet TAKE ONE TABLET BY MOUTH ONCE EVERY EVENING FOR ALLERGIES 90 tablet 0   levothyroxine  (SYNTHROID ) 137 MCG tablet Take 137 mcg by mouth daily before breakfast.     metFORMIN (GLUCOPHAGE) 1000 MG tablet TAKE 1 TABLET BY MOUTH TWICE A DAY 180 tablet 1   metoprolol succinate (TOPROL-XL) 25 MG 24 hr tablet Take 1 tablet by mouth daily.     NOVOLOG FLEXPEN 100 UNIT/ML FlexPen Inject 28 units before breakfast, inject 24 units beforelunch, inject 34 units before supper. Increase when blood sugar is over 150. Use 2 units more for every 50 over.  9   ramipril (ALTACE) 10 MG capsule TAKE 1 CAPSULE BY MOUTH 2 TIMES DAILY 180 capsule 1  rosuvastatin  (CRESTOR ) 10 MG tablet TAKE ONE TABLET BY MOUTH ONCE A DAY FOR CHOLESTEROL. 90 tablet 3   Spacer/Aero-Holding Chambers (AEROCHAMBER PLUS FLO-VU MEDIUM) MISC Use daily as needed with inhaler 1 each 0   spironolactone  (ALDACTONE ) 25 MG tablet Take 1 tablet (25 mg total) by mouth daily. 90 tablet 3   tirzepatide (MOUNJARO) 5 MG/0.5ML Pen Inject 5 mg into the skin once a week.     torsemide  (DEMADEX ) 20 MG tablet Take 1 tablet (20 mg total) by mouth daily. 120 tablet 3   TRUEPLUS 5-BEVEL PEN NEEDLES 31G X 6 MM MISC      methotrexate (RHEUMATREX) 2.5 MG tablet Take 2.5 mg by  mouth once a week. Caution:Chemotherapy. Protect from light. (Patient not taking: Reported on 05/08/2024)     No facility-administered medications prior to visit.     Per HPI unless specifically indicated in ROS section below Review of Systems  Constitutional:  Negative for chills and fever.  HENT:  Positive for postnasal drip, sinus pressure and sinus pain.   Respiratory:  Positive for cough.    Objective:  BP (!) 154/83   Pulse 89   Ht 5' 10 (1.778 m)   Wt (!) 303 lb (137.4 kg)   BMI 43.48 kg/m   Wt Readings from Last 3 Encounters:  05/29/24 (!) 303 lb (137.4 kg)  05/08/24 (!) 303 lb (137.4 kg)  04/01/24 299 lb 12.8 oz (136 kg)       Physical exam: General: Alert and oriented x 3, no distress, does not appear sickly  Pulmonary: Speaks in complete sentences without increased work of breathing, no cough during visit.  Psychiatric: Normal mood, thought content, and behavior.     Results for orders placed or performed in visit on 05/08/24  PSA, Medicare   Collection Time: 05/08/24  8:50 AM  Result Value Ref Range   PSA 1.29 0.10 - 4.00 ng/ml   Assessment & Plan:   Problem List Items Addressed This Visit       Respiratory   Acute non-recurrent frontal sinusitis - Primary   HPI suggestive.  Given duration of symptoms, coupled with lack of improvement, will treat for presumed bacterial involvement.  Start Augmentin  antibiotics. Take 1 tablet by mouth twice daily for 10 days. Nasal Congestion/Ear Pressure/Sinus Pressure: Try using Flonase  (fluticasone ) nasal spray. Instill 1 spray in each nostril twice daily.   Follow-up as needed.      Relevant Medications   amoxicillin -clavulanate (AUGMENTIN ) 875-125 MG tablet     Meds ordered this encounter  Medications   amoxicillin -clavulanate (AUGMENTIN ) 875-125 MG tablet    Sig: Take 1 tablet by mouth 2 (two) times daily.    Dispense:  20 tablet    Refill:  0    Supervising Provider:   BEDSOLE, AMY E [[id number]]   No  orders of the defined types were placed in this encounter.   I discussed the assessment and treatment plan with the patient. The patient was provided an opportunity to ask questions and all were answered. The patient agreed with the plan and demonstrated an understanding of the instructions. The patient was advised to call back or seek an in-person evaluation if the symptoms worsen or if the condition fails to improve as anticipated.  Follow up plan:  Start Augmentin  antibiotics. Take 1 tablet by mouth twice daily for 10 days.   Nasal Congestion/Ear Pressure/Sinus Pressure: Try using Flonase  (fluticasone ) nasal spray. Instill 1 spray in each nostril twice daily.   It was  a pleasure to see you today!    Julien Oscar K Hyatt Capobianco, NP

## 2024-06-10 ENCOUNTER — Ambulatory Visit: Attending: Cardiology | Admitting: Cardiology

## 2024-06-10 ENCOUNTER — Encounter: Payer: Self-pay | Admitting: Cardiology

## 2024-06-10 VITALS — BP 122/56 | HR 85 | Ht 70.0 in | Wt 301.0 lb

## 2024-06-10 DIAGNOSIS — I2510 Atherosclerotic heart disease of native coronary artery without angina pectoris: Secondary | ICD-10-CM | POA: Diagnosis not present

## 2024-06-10 DIAGNOSIS — R600 Localized edema: Secondary | ICD-10-CM | POA: Diagnosis not present

## 2024-06-10 DIAGNOSIS — I10 Essential (primary) hypertension: Secondary | ICD-10-CM | POA: Diagnosis not present

## 2024-06-10 NOTE — Patient Instructions (Signed)
 Medication Instructions:  Your physician recommends that you continue on your current medications as directed. Please refer to the Current Medication list given to you today.   *If you need a refill on your cardiac medications before your next appointment, please call your pharmacy*  Lab Work: No labs ordered today  If you have labs (blood work) drawn today and your tests are completely normal, you will receive your results only by: MyChart Message (if you have MyChart) OR A paper copy in the mail If you have any lab test that is abnormal or we need to change your treatment, we will call you to review the results.  Testing/Procedures: No test ordered today   Follow-Up: At [facility], you and your health needs are our priority.  As part of our continuing mission to provide you with exceptional heart care, our providers are all part of one team.  This team includes your primary Cardiologist (physician) and Advanced Practice Providers or APPs (Physician Assistants and Nurse Practitioners) who all work together to provide you with the care you need, when you need it.  Your next appointment:   6 month(s)  Provider:   You may see Dr. Darliss or one of the following Advanced Practice Providers on your designated Care Team:   Lonni Meager, NP Lesley Maffucci, PA-C Bernardino Bring, PA-C Cadence Olyphant, PA-C Tylene Lunch, NP Barnie Hila, NP    We recommend signing up for the patient portal called MyChart.  Sign up information is provided on this After Visit Summary.  MyChart is used to connect with patients for Virtual Visits (Telemedicine).  Patients are able to view lab/test results, encounter notes, upcoming appointments, etc.  Non-urgent messages can be sent to your provider as well.   To learn more about what you can do with MyChart, go to [email or web address].

## 2024-06-10 NOTE — Progress Notes (Signed)
 Cardiology Office Note:    Date:  06/10/2024   ID:  [name], DOB 29-Jun-1951, MRN [id number]  PCP:  Gretta Comer POUR, NP    HeartCare Providers Cardiologist:  None     Referring MD: Gretta Comer POUR, NP   Chief Complaint  Patient presents with   Follow-up    6 month follow up pt has been doing well with no complaints of chest pain, chest pressure or SOB, medciation reviewed verbally with patient    History of Present Illness:    [name] is a 73 y.o. male with a hx of CAD (LAD, RCA calcifications on chest CT )hypertension, diabetes, former smoker x 30+ years, COPD, OSA on CPAP who presents for follow-up.  Doing okay, denies chest pain or shortness of breath.  Started on Mounjaro by her endocrinologist for diabetes and also weight loss.  States losing some weight.  Tolerating torsemide , endorses adequate diuresing.  Still has mild lower extremity edema, significantly improved from prior.  Feels well, has no concerns today.  Prior notes/testing Echocardiogram 07/31/2023 showed normal systolic function EF 65 to 70%, impaired relaxation.  Past Medical History:  Diagnosis Date   Acute maxillary sinusitis 12/05/2020   Adenomatous polyp    Aortic atherosclerosis 06/16/2020   Arthritis    knee   Chronic obstructive lung disease (HCC)    Colon polyp 2008   COPD with emphysema (HCC)    COVID-19 02/14/2022   Diabetes mellitus    Emphysema lung (HCC) 06/16/2020   Emphysema lung (HCC)    Emphysema of lung (HCC) 2016   Hypercholesteremia    Hyperlipemia    Hypertension    Hypothyroidism    Observed sleep apnea    Oxygen  deficiency 2016   Sleep apnea    Statin myopathy     Past Surgical History:  Procedure Laterality Date   COLONOSCOPY  2008, 2016   Dr Dessa   COLONOSCOPY N/A 11/21/2023   Procedure: COLONOSCOPY;  Surgeon: Tye Millet, DO;  Location: [facility];  Service: General;  Laterality: N/A;   COLONOSCOPY WITH PROPOFOL  N/A 10/03/2017    Procedure: COLONOSCOPY WITH PROPOFOL ;  Surgeon: Dessa Reyes ORN, MD;  Location: [facility]COPY;  Service: Endoscopy;  Laterality: N/A;   COLONOSCOPY WITH PROPOFOL  N/A 10/08/2020   Procedure: COLONOSCOPY WITH PROPOFOL ;  Surgeon: Dessa Reyes ORN, MD;  Location: [facility];  Service: Endoscopy;  Laterality: N/A;   DERMATOFIBROMA  03/26/2015   Procedure: Excision mass left leg;  Surgeon: Reyes ORN Dessa, MD;  Location: [facility] ORS;  Service: General;  Laterality: N/A;   LIPOMA EXCISION N/A 03/26/2015       TONSILLECTOMY      Current Medications: Current Meds  Medication Sig   albuterol  (VENTOLIN  HFA) 108 (90 Base) MCG/ACT inhaler INHALE 2 PUFFS INTO THE LUNGS EVERY 4 HOURS AS NEEDED FOR WHEEZING OR SHORTNESS OF BREATH   amoxicillin -clavulanate (AUGMENTIN ) 875-125 MG tablet Take 1 tablet by mouth 2 (two) times daily.   aspirin 81 MG EC tablet Take by mouth.   Continuous Blood Gluc Receiver (FREESTYLE LIBRE 2 READER) DEVI by Does not apply route.   Continuous Blood Gluc Sensor (FREESTYLE LIBRE 2 SENSOR) MISC by Does not apply route.   ENBREL SURECLICK 50 MG/ML injection Inject 50 mg into the skin once a week.   fenofibrate  (TRICOR ) 145 MG tablet Take 1 tablet by mouth daily.   fluticasone  (FLONASE ) 50 MCG/ACT nasal spray Place 1 spray into both nostrils 2 (two) times daily.  folic acid (FOLVITE) 1 MG tablet Take 1 mg by mouth daily.   insulin  degludec (TRESIBA) 200 UNIT/ML FlexTouch Pen Inject 70 Units into the skin daily.   levocetirizine (XYZAL ) 5 MG tablet TAKE ONE TABLET BY MOUTH ONCE EVERY EVENING FOR ALLERGIES   levothyroxine  (SYNTHROID ) 137 MCG tablet Take 137 mcg by mouth daily before breakfast.   metFORMIN (GLUCOPHAGE) 1000 MG tablet TAKE 1 TABLET BY MOUTH TWICE A DAY   metoprolol succinate (TOPROL-XL) 25 MG 24 hr tablet Take 1 tablet by mouth daily.   NOVOLOG FLEXPEN 100 UNIT/ML FlexPen Inject 28 units before breakfast, inject 24 units beforelunch, inject 34 units before  supper. Increase when blood sugar is over 150. Use 2 units more for every 50 over.   ramipril (ALTACE) 10 MG capsule TAKE 1 CAPSULE BY MOUTH 2 TIMES DAILY   rosuvastatin  (CRESTOR ) 10 MG tablet TAKE ONE TABLET BY MOUTH ONCE A DAY FOR CHOLESTEROL.   Spacer/Aero-Holding Chambers (AEROCHAMBER PLUS FLO-VU MEDIUM) MISC Use daily as needed with inhaler   spironolactone  (ALDACTONE ) 25 MG tablet Take 1 tablet (25 mg total) by mouth daily.   tirzepatide (MOUNJARO) 5 MG/0.5ML Pen Inject 5 mg into the skin once a week.   torsemide  (DEMADEX ) 20 MG tablet Take 1 tablet (20 mg total) by mouth daily.   TRUEPLUS 5-BEVEL PEN NEEDLES 31G X 6 MM MISC      Allergies:   Atorvastatin and Other   Social History   Socioeconomic History   Marital status: Married    Spouse name: Orlean   Number of children: 3   Years of education: Not on file   Highest education level: Associate degree: occupational, scientist, product/process development, or vocational program  Occupational History   Occupation: retired  Tobacco Use   Smoking status: Former    Current packs/day: 0.00    Average packs/day: 2.0 packs/day for 35.0 years (70.0 ttl pk-yrs)    Types: Cigarettes, E-cigarettes    Start date: 12/11/1979    Quit date: 12/11/2014    Years since quitting: 9.5   Smokeless tobacco: Never  Vaping Use   Vaping status: Never Used  Substance and Sexual Activity   Alcohol use: No   Drug use: No   Sexual activity: Not Currently    Birth control/protection: None  Other Topics Concern   Not on file  Social History Narrative   Married.   3 children. 2 grandchildren.   Retired. He once worked as a naval architect.   Enjoys working on projects around [employer].    One son lives next door, another stays with them sometimes   Social Drivers of Corporate Investment Banker Strain: Low Risk  (05/04/2024)   Overall Financial Resource Strain (CARDIA)    Difficulty of Paying Living Expenses: Not hard at all  Food Insecurity: No Food Insecurity  (05/04/2024)   Hunger Vital Sign    Worried About Running Out of Food in the Last Year: Never true    Ran Out of Food in the Last Year: Never true  Transportation Needs: No Transportation Needs (05/04/2024)   PRAPARE - Administrator, Civil Service (Medical): No    Lack of Transportation (Non-Medical): No  Physical Activity: Insufficiently Active (05/04/2024)   Exercise Vital Sign    Days of Exercise per Week: 4 days    Minutes of Exercise per Session: 30 min  Stress: No Stress Concern Present (05/04/2024)   [employer] of Occupational Health - Occupational Stress Questionnaire    Feeling  of Stress: Not at all  Social Connections: Unknown (05/04/2024)   Social Connection and Isolation Panel    Frequency of Communication with Friends and Family: More than three times a week    Frequency of Social Gatherings with Friends and Family: Twice a week    Attends Religious Services: Not on Marketing Executive or Organizations: No    Attends Engineer, Structural: Not on file    Marital Status: Married     Family History: The patient's family history includes Arthritis in his father; Diabetes in his maternal grandmother, mother, paternal grandmother, and another family member; Early death in his mother; Hypertension in his father; Other (age of onset: 7) in his daughter; Stroke in his father and mother.  ROS:   Please see the history of present illness.     All other systems reviewed and are negative.  EKGs/Labs/Other Studies Reviewed:    The following studies were reviewed today:  EKG Interpretation Date/Time:  Tuesday June 10 2024 08:25:10 EST Ventricular Rate:  85 PR Interval:  164 QRS Duration:  114 QT Interval:  388 QTC Calculation: 461 R Axis:   75  Text Interpretation: Sinus rhythm with occasional Premature ventricular complexes Low voltage QRS Right bundle branch block Confirmed by Darliss Rogue ([id number]) on 06/10/2024 8:39:35 AM     Recent Labs: 11/09/2023: BUN 16; Creatinine, Ser 1.08; Potassium 4.2; Sodium 141  Recent Lipid Panel    Component Value Date/Time   CHOL 96 05/02/2022 1150   CHOL 151 02/19/2015 0923   TRIG 194.0 (H) 05/02/2022 1150   HDL 27.60 (L) 05/02/2022 1150   HDL 42 02/19/2015 0923   CHOLHDL 3 05/02/2022 1150   VLDL 38.8 05/02/2022 1150   LDLCALC 30 05/02/2022 1150   LDLCALC 82 02/19/2015 0923     Risk Assessment/Calculations:             Physical Exam:    VS:  BP (!) 122/56 (BP Location: Left Arm, Patient Position: Sitting)   Pulse 85   Ht 5' 10 (1.778 m)   Wt (!) 301 lb (136.5 kg)   SpO2 96%   BMI 43.19 kg/m     Wt Readings from Last 3 Encounters:  06/10/24 (!) 301 lb (136.5 kg)  05/29/24 (!) 303 lb (137.4 kg)  05/08/24 (!) 303 lb (137.4 kg)     GEN:  Well nourished, well developed in no acute distress HEENT: Normal NECK: No JVD; No carotid bruits CARDIAC: RRR, no murmurs, rubs, gallops RESPIRATORY:  Clear to auscultation without rales, wheezing or rhonchi  ABDOMEN: Soft, non-tender, non-distended MUSCULOSKELETAL:  1+ edema; No deformity  SKIN: Warm and dry NEUROLOGIC:  Alert and oriented x 3 PSYCHIATRIC:  Normal affect   ASSESSMENT:    1. Essential (primary) hypertension   2. Bilateral leg edema   3. Coronary artery disease involving native coronary artery of native heart without angina pectoris   4. Morbid obesity (HCC)    PLAN:    In order of problems listed above:  Bilateral edema, 1+ on exam.  Echo with normal EF, impaired relaxation.  Lost some weight with Mounjaro.  Edema likely from obesity.  Continue torsemide  20 mg daily, Aldactone  25 mg daily.  Consider uptitration of torsemide  if edema persists at follow-up visit.  Hopefully with weight loss, leg swelling improves.   Hypertension, BP controlled.  Continue torsemide  20 mg daily, Aldactone  25 mg daily, ramipril 10 mg twice daily, Toprol-XL 25 mg daily. CAD, LAD,  RCA calcifications.  Denies chest  pain.  Cholesterol controlled, LDL at goal.  Continue aspirin, Crestor  10 mg daily. Morbid obesity, low-calorie diet advised.  Continue Mounjaro, low-calorie diet.  Follow-up in 6 months      Medication Adjustments/Labs and Tests Ordered: Current medicines are reviewed at length with the patient today.  Concerns regarding medicines are outlined above.  Orders Placed This Encounter  Procedures   EKG 12-Lead   No orders of the defined types were placed in this encounter.   Patient Instructions  Medication Instructions:  Your physician recommends that you continue on your current medications as directed. Please refer to the Current Medication list given to you today.   *If you need a refill on your cardiac medications before your next appointment, please call your pharmacy*  Lab Work: No labs ordered today  If you have labs (blood work) drawn today and your tests are completely normal, you will receive your results only by: MyChart Message (if you have MyChart) OR A paper copy in the mail If you have any lab test that is abnormal or we need to change your treatment, we will call you to review the results.  Testing/Procedures: No test ordered today   Follow-Up: At [facility], you and your health needs are our priority.  As part of our continuing mission to provide you with exceptional heart care, our providers are all part of one team.  This team includes your primary Cardiologist (physician) and Advanced Practice Providers or APPs (Physician Assistants and Nurse Practitioners) who all work together to provide you with the care you need, when you need it.  Your next appointment:   6 month(s)  Provider:   You may see Dr Darliss or one of the following Advanced Practice Providers on your designated Care Team:   Lonni Meager, NP Lesley Maffucci, PA-C Bernardino Bring, PA-C Cadence Numa, PA-C Tylene Lunch, NP Barnie Hila, NP    We recommend signing up for the  patient portal called MyChart.  Sign up information is provided on this After Visit Summary.  MyChart is used to connect with patients for Virtual Visits (Telemedicine).  Patients are able to view lab/test results, encounter notes, upcoming appointments, etc.  Non-urgent messages can be sent to your provider as well.   To learn more about what you can do with MyChart, go to [email or web address].          Signed, Redell Darliss, MD  06/10/2024 9:34 AM    [facility]

## 2024-06-16 DIAGNOSIS — E1159 Type 2 diabetes mellitus with other circulatory complications: Secondary | ICD-10-CM | POA: Diagnosis not present

## 2024-06-18 DIAGNOSIS — G4733 Obstructive sleep apnea (adult) (pediatric): Secondary | ICD-10-CM | POA: Diagnosis not present

## 2024-06-19 DIAGNOSIS — E039 Hypothyroidism, unspecified: Secondary | ICD-10-CM | POA: Diagnosis not present

## 2024-06-19 DIAGNOSIS — E782 Mixed hyperlipidemia: Secondary | ICD-10-CM | POA: Diagnosis not present

## 2024-06-19 DIAGNOSIS — E1142 Type 2 diabetes mellitus with diabetic polyneuropathy: Secondary | ICD-10-CM | POA: Diagnosis not present

## 2024-06-19 DIAGNOSIS — E66813 Obesity, class 3: Secondary | ICD-10-CM | POA: Diagnosis not present

## 2024-06-19 DIAGNOSIS — I10 Essential (primary) hypertension: Secondary | ICD-10-CM | POA: Diagnosis not present

## 2024-06-19 DIAGNOSIS — E1159 Type 2 diabetes mellitus with other circulatory complications: Secondary | ICD-10-CM | POA: Diagnosis not present

## 2024-06-25 ENCOUNTER — Ambulatory Visit: Admitting: Internal Medicine

## 2024-06-25 ENCOUNTER — Encounter: Payer: Self-pay | Admitting: Internal Medicine

## 2024-06-25 VITALS — BP 110/70 | HR 87 | Temp 98.2°F | Ht 70.0 in | Wt 304.8 lb

## 2024-06-25 DIAGNOSIS — Z6841 Body Mass Index (BMI) 40.0 and over, adult: Secondary | ICD-10-CM | POA: Diagnosis not present

## 2024-06-25 DIAGNOSIS — E669 Obesity, unspecified: Secondary | ICD-10-CM | POA: Diagnosis not present

## 2024-06-25 DIAGNOSIS — J9611 Chronic respiratory failure with hypoxia: Secondary | ICD-10-CM | POA: Diagnosis not present

## 2024-06-25 DIAGNOSIS — Z87891 Personal history of nicotine dependence: Secondary | ICD-10-CM | POA: Diagnosis not present

## 2024-06-25 DIAGNOSIS — J449 Chronic obstructive pulmonary disease, unspecified: Secondary | ICD-10-CM | POA: Diagnosis not present

## 2024-06-25 DIAGNOSIS — G4733 Obstructive sleep apnea (adult) (pediatric): Secondary | ICD-10-CM | POA: Diagnosis not present

## 2024-06-25 NOTE — Progress Notes (Signed)
 SYNOPSIS 73 year old male, former smoker followed for emphysema, chronic respiratory failure and OSA on CPAP. He is a former patient of Dr. Magdaleno and last seen in office 09/09/2020. Past medical history significant for HTN, CAD, allergic rhinitis, GERD, DM, hypothyroid, osteoarthritis, obesity.   TEST/EVENTS:  11/14/2017 HST: AHI 50, SpO2 low 71% 01/08/2018 PFT: FVC 69, FEV1 58, ratio 69, TLC 78, DLCOcor 44. No BD 07/21/2022 LDCT chest: atherosclerosis. Stable mediastinal nodes. Emphysema. Stable pulmonary nodules, up to 4.4 mm. Lung RADS 2. Liver steatosis and possible cirrhosis.   09/09/2020: OV with Dr. Shellia. Uses CPAP with oxygen  1.5 lpm. No issues with mask fit. Feels rested. Not having cough, wheezing, or sputum. Hasn't needed albuterol . Had follow up low dose CT and findings stable. '   CC Follow-up assessment for OSA Follow-up assessment for COPD Follow-up lung cancer screening program   HPI Regarding COPD No exacerbation at this time No evidence of heart failure at this time No evidence or signs of infection at this time No respiratory distress No fevers, chills, nausea, vomiting, diarrhea No evidence of lower extremity edema No evidence hemoptysis      Regarding OSA Discussed sleep data and reviewed with patient.  Encouraged proper weight management.  Discussed sleep hygiene Patient uses and benefits from therapy Using CPAP nightly and with naps Settings are comfortable and is sleeping well. Excellent compliance report Recommend using CPAP during nap time current prescription Auto CPAP 10-20 AHI reduced to 0.6  Regarding lung cancer program CT scan of the chest January 2025  No significant findings Follow-up annually as scheduled Follow-up CT chest pending      Allergies  Allergen Reactions   Atorvastatin     Other reaction(s): Headache   Other Other (See Comments)    STATIN DRUG-CAUSED MUSCLE PAIN/LETHARGIC    Immunization History  Administered  Date(s) Administered   Fluad Quad(high Dose 65+) 04/15/2019, 04/26/2020, 04/27/2021, 05/02/2022   Fluad Trivalent(High Dose 65+) 05/08/2023   INFLUENZA, HIGH DOSE SEASONAL PF 05/08/2024   Influenza Split 04/26/2007   Influenza,inj,Quad PF,6+ Mos 03/18/2015, 05/10/2017, 04/08/2018   Influenza-Unspecified 03/18/2015, 04/16/2016, 05/10/2017   PFIZER Comirnaty(Gray Top)Covid-19 Tri-Sucrose Vaccine 09/16/2019, 10/07/2019   PFIZER(Purple Top)SARS-COV-2 Vaccination 07/19/2020   Pneumococcal Conjugate-13 09/25/2017   Pneumococcal Polysaccharide-23 06/19/2011, 06/05/2016   Tdap 06/19/2011, 04/01/2015   Zoster Recombinant(Shingrix) 04/15/2019, 06/17/2019    Past Medical History:  Diagnosis Date   Acute maxillary sinusitis 12/05/2020   Adenomatous polyp    Aortic atherosclerosis 06/16/2020   Arthritis    knee   Chronic obstructive lung disease (HCC)    Colon polyp 2008   COPD with emphysema (HCC)    COVID-19 02/14/2022   Diabetes mellitus    Emphysema lung (HCC) 06/16/2020   Emphysema lung (HCC)    Emphysema of lung (HCC) 2016   Hypercholesteremia    Hyperlipemia    Hypertension    Hypothyroidism    Observed sleep apnea    Oxygen  deficiency 2016   Sleep apnea    Statin myopathy     Tobacco History: Social History   Tobacco Use  Smoking Status Former   Current packs/day: 0.00   Average packs/day: 2.0 packs/day for 35.0 years (70.0 ttl pk-yrs)   Types: Cigarettes, E-cigarettes   Start date: 12/11/1979   Quit date: 12/11/2014   Years since quitting: 9.5  Smokeless Tobacco Never   Counseling given: Not Answered   Outpatient Medications Prior to Visit  Medication Sig Dispense Refill   albuterol  (VENTOLIN  HFA) 108 (  90 Base) MCG/ACT inhaler INHALE 2 PUFFS INTO THE LUNGS EVERY 4 HOURS AS NEEDED FOR WHEEZING OR SHORTNESS OF BREATH 18 g 0   aspirin 81 MG EC tablet Take by mouth.     BD PEN NEEDLE NANO ULTRAFINE 32G X 4 MM MISC Inject 1 each into the skin.     Continuous Blood  Gluc Receiver (FREESTYLE LIBRE 2 READER) DEVI by Does not apply route.     Continuous Blood Gluc Sensor (FREESTYLE LIBRE 2 SENSOR) MISC by Does not apply route.     ENBREL SURECLICK 50 MG/ML injection Inject 50 mg into the skin once a week.     fenofibrate  (TRICOR ) 145 MG tablet Take 1 tablet by mouth daily.     fluticasone  (FLONASE ) 50 MCG/ACT nasal spray Place 1 spray into both nostrils 2 (two) times daily. 16 g 0   folic acid (FOLVITE) 1 MG tablet Take 1 mg by mouth daily.     insulin  degludec (TRESIBA) 200 UNIT/ML FlexTouch Pen Inject 70 Units into the skin daily.     levocetirizine (XYZAL ) 5 MG tablet TAKE ONE TABLET BY MOUTH ONCE EVERY EVENING FOR ALLERGIES 90 tablet 0   levothyroxine  (SYNTHROID ) 137 MCG tablet Take 137 mcg by mouth daily before breakfast.     metFORMIN (GLUCOPHAGE) 1000 MG tablet TAKE 1 TABLET BY MOUTH TWICE A DAY 180 tablet 1   metoprolol succinate (TOPROL-XL) 25 MG 24 hr tablet Take 1 tablet by mouth daily.     NOVOLOG FLEXPEN 100 UNIT/ML FlexPen Inject 28 units before breakfast, inject 24 units beforelunch, inject 34 units before supper. Increase when blood sugar is over 150. Use 2 units more for every 50 over.  9   ramipril (ALTACE) 10 MG capsule TAKE 1 CAPSULE BY MOUTH 2 TIMES DAILY 180 capsule 1   rosuvastatin  (CRESTOR ) 10 MG tablet TAKE ONE TABLET BY MOUTH ONCE A DAY FOR CHOLESTEROL. 90 tablet 3   Spacer/Aero-Holding Chambers (AEROCHAMBER PLUS FLO-VU MEDIUM) MISC Use daily as needed with inhaler 1 each 0   spironolactone  (ALDACTONE ) 25 MG tablet Take 1 tablet (25 mg total) by mouth daily. 90 tablet 3   tirzepatide (MOUNJARO) 5 MG/0.5ML Pen Inject 5 mg into the skin once a week.     tirzepatide (MOUNJARO) 7.5 MG/0.5ML Pen Inject 7.5 mg into the skin once a week.     torsemide  (DEMADEX ) 20 MG tablet Take 1 tablet (20 mg total) by mouth daily. 120 tablet 3   amoxicillin -clavulanate (AUGMENTIN ) 875-125 MG tablet Take 1 tablet by mouth 2 (two) times daily. 20 tablet 0    TRUEPLUS 5-BEVEL PEN NEEDLES 31G X 6 MM MISC      No facility-administered medications prior to visit.    BP 110/70   Pulse 87   Temp 98.2 F (36.8 C)   Ht 5' 10 (1.778 m)   Wt (!) 304 lb 12.8 oz (138.3 kg)   SpO2 94%   BMI 43.73 kg/m     Review of Systems: Gen:  Denies  fever, sweats, chills weight loss  HEENT: Denies blurred vision, double vision, ear pain, eye pain, hearing loss, nose bleeds, sore throat Cardiac:  No dizziness, chest pain or heaviness, chest tightness,edema, No JVD Resp:   No cough, -sputum production, -shortness of breath,-wheezing, -hemoptysis,  Other:  All other systems negative   Physical Examination:   General Appearance: No distress  EYES PERRLA, EOM intact.   NECK Supple, No JVD Pulmonary: normal breath sounds, No wheezing.  CardiovascularNormal S1,S2.  No m/r/g.   Abdomen: Benign, Soft, non-tender. Neurology UE/LE 5/5 strength, no focal deficits Ext pulses intact, cap refill intact ALL OTHER ROS ARE NEGATIVE   Lab Results:  CBC    Component Value Date/Time   WBC 8.1 02/19/2015 0923   WBC 6.9 01/12/2012 1909   RBC 4.68 02/19/2015 0923   RBC 4.40 01/12/2012 1909   HGB 14.2 02/19/2015 0923   HCT 41.3 02/19/2015 0923   PLT 269 02/19/2015 0923   MCV 88 02/19/2015 0923   MCH 30.3 02/19/2015 0923   MCH 30.9 01/12/2012 1909   MCHC 34.4 02/19/2015 0923   MCHC 34.8 01/12/2012 1909   RDW 13.0 02/19/2015 0923   LYMPHSABS 2.6 02/19/2015 0923   EOSABS 0.2 02/19/2015 0923   BASOSABS 0.0 02/19/2015 0923    BMET    Component Value Date/Time   NA 141 11/09/2023 1403   K 4.2 11/09/2023 1403   CL 102 11/09/2023 1403   CO2 23 11/09/2023 1403   GLUCOSE 64 (L) 11/09/2023 1403   GLUCOSE 108 (H) 05/02/2022 1150   BUN 16 11/09/2023 1403   CREATININE 1.08 11/09/2023 1403   CALCIUM  9.6 11/09/2023 1403   GFRNONAA 76 02/19/2015 0923   GFRAA 88 02/19/2015 0923         Assessment & Plan:  73 year old pleasant white male seen today for  follow-up assessment for COPD and severe OSA, follow-up lung cancer screening program, chronic hypoxic resp failure  Assessment of COPD No exacerbation at this time Patient using albuterol  as needed No indication for maintenance therapy at this time Avoid Allergens and Irritants Avoid secondhand smoke Avoid SICK contacts Recommend  Masking  when appropriate Recommend Keep up-to-date with vaccinations    Assessment of OSA Previous AHI 50 Reviewed compliance report in detail with patient Patient definitely benefits the use of CPAP therapy as prescribed Using CPAP nightly and with naps Pressure setting is comfortable and is sleeping well. CPAP prescription 10-20 AHI reduced to 0.6 Excellent compliance report   Extensive smoking history 70-pack-year Lung cancer screening program low-dose CT chest January 2025  no significant findings Follow-up annually Repeat CT chest pending January 2026  Chronic Hypoxic resp failure due to COPD -Patient benefits from oxygen  therapy 1.5L [place]  -recommend using oxygen  as prescribed -patient needs this for survival Continue supplemental oxygen  1.5 lpm bled through CPAP nightly - Goal O2 >88-90%  Obesity -recommend significant weight loss -recommend changing diet  Deconditioned state -Recommend increased daily activity and exercise    MEDICATION ADJUSTMENTS/LABS AND TESTS ORDERED: Continue albuterol  as needed Continue oxygen  as prescribed Follow-up lung cancer screening Continue CPAP as prescribed     CURRENT MEDICATIONS REVIEWED AT LENGTH WITH PATIENT TODAY   Patient  satisfied with Plan of action and management. All questions answered   Follow up 1 year   I spent a total of 40 minutes dedicated to the care of this patient on the date of this encounter to include pre-visit review of records, face-to-face time with the patient discussing conditions above, post visit ordering of testing, clinical documentation with the  electronic health record, making appropriate referrals as documented, and communicating necessary information to the patient's healthcare team.    The Patient requires high complexity decision making for assessment and support, frequent evaluation and titration of therapies, application of advanced monitoring technologies and extensive interpretation of multiple databases.  Patient satisfied with Plan of action and management. All questions answered    Nickolas Alm Cellar, M.D.  [facility]  Medical Director [facility]

## 2024-06-25 NOTE — Patient Instructions (Signed)
 Excellent Job A+ GOLD STAR!!  Continue CPAP as prescribed  Patient Instructions Continue to use CPAP every night, minimum of 4-6 hours a night.  Change equipment every 30 days or as directed by DME.  Wash your tubing with warm soap and water daily, hang to dry. Wash humidifier portion weekly. Use bottled, distilled water and change daily   Be aware of reduced alertness and do not drive or operate heavy machinery if experiencing this or drowsiness.  Exercise encouraged, as tolerated. Encouraged proper weight management.  Important to get eight or more hours of sleep  Limiting the use of the computer and television before bedtime.  Decrease naps during the day, so night time sleep will become enhanced.  Limit caffeine, and sleep deprivation.    Avoid Allergens and Irritants Avoid secondhand smoke Avoid SICK contacts Recommend  Masking  when appropriate Recommend Keep up-to-date with vaccinations  Follow up Lung Cancer Screening Program

## 2024-07-09 ENCOUNTER — Other Ambulatory Visit: Payer: Self-pay | Admitting: Primary Care

## 2024-07-09 DIAGNOSIS — J309 Allergic rhinitis, unspecified: Secondary | ICD-10-CM

## 2024-07-14 ENCOUNTER — Other Ambulatory Visit: Payer: Self-pay

## 2024-07-14 DIAGNOSIS — Z122 Encounter for screening for malignant neoplasm of respiratory organs: Secondary | ICD-10-CM

## 2024-07-14 DIAGNOSIS — Z87891 Personal history of nicotine dependence: Secondary | ICD-10-CM

## 2024-08-04 ENCOUNTER — Ambulatory Visit
Admission: RE | Admit: 2024-08-04 | Discharge: 2024-08-04 | Disposition: A | Discharge status: Home / Self Care | Source: Ambulatory Visit | Attending: Acute Care | Admitting: Acute Care

## 2024-08-04 DIAGNOSIS — Z122 Encounter for screening for malignant neoplasm of respiratory organs: Secondary | ICD-10-CM | POA: Diagnosis present

## 2024-08-04 DIAGNOSIS — Z87891 Personal history of nicotine dependence: Secondary | ICD-10-CM | POA: Diagnosis present

## 2024-08-11 ENCOUNTER — Other Ambulatory Visit: Payer: Self-pay

## 2024-08-11 DIAGNOSIS — Z122 Encounter for screening for malignant neoplasm of respiratory organs: Secondary | ICD-10-CM

## 2024-08-11 DIAGNOSIS — Z87891 Personal history of nicotine dependence: Secondary | ICD-10-CM

## 2025-01-13 ENCOUNTER — Ambulatory Visit

## 2025-02-10 ENCOUNTER — Ambulatory Visit
